# Patient Record
Sex: Male | Born: 1935 | Race: White | Hispanic: No | Marital: Married | Smoking: Never smoker
Health system: Southern US, Community
[De-identification: ages and names within clinical notes are randomized; demographics above are authoritative.]

## PROBLEM LIST (undated history)

## (undated) DIAGNOSIS — N183 Chronic kidney disease, stage 3 unspecified: Secondary | ICD-10-CM

## (undated) DIAGNOSIS — I4892 Unspecified atrial flutter: Secondary | ICD-10-CM

## (undated) DIAGNOSIS — I493 Ventricular premature depolarization: Secondary | ICD-10-CM

## (undated) DIAGNOSIS — F32A Depression, unspecified: Secondary | ICD-10-CM

## (undated) DIAGNOSIS — I5020 Unspecified systolic (congestive) heart failure: Secondary | ICD-10-CM

## (undated) DIAGNOSIS — Z860100 Personal history of colon polyps, unspecified: Secondary | ICD-10-CM

## (undated) DIAGNOSIS — I340 Nonrheumatic mitral (valve) insufficiency: Secondary | ICD-10-CM

## (undated) DIAGNOSIS — I2510 Atherosclerotic heart disease of native coronary artery without angina pectoris: Secondary | ICD-10-CM

## (undated) DIAGNOSIS — N400 Enlarged prostate without lower urinary tract symptoms: Secondary | ICD-10-CM

## (undated) DIAGNOSIS — I6529 Occlusion and stenosis of unspecified carotid artery: Secondary | ICD-10-CM

## (undated) DIAGNOSIS — C4490 Unspecified malignant neoplasm of skin, unspecified: Secondary | ICD-10-CM

## (undated) DIAGNOSIS — C4491 Basal cell carcinoma of skin, unspecified: Secondary | ICD-10-CM

## (undated) DIAGNOSIS — R011 Cardiac murmur, unspecified: Secondary | ICD-10-CM

## (undated) DIAGNOSIS — F419 Anxiety disorder, unspecified: Secondary | ICD-10-CM

## (undated) DIAGNOSIS — H269 Unspecified cataract: Secondary | ICD-10-CM

## (undated) DIAGNOSIS — Z978 Presence of other specified devices: Secondary | ICD-10-CM

## (undated) DIAGNOSIS — Z960 Presence of urogenital implants: Secondary | ICD-10-CM

## (undated) DIAGNOSIS — J189 Pneumonia, unspecified organism: Secondary | ICD-10-CM

## (undated) DIAGNOSIS — I471 Supraventricular tachycardia: Secondary | ICD-10-CM

## (undated) DIAGNOSIS — N390 Urinary tract infection, site not specified: Secondary | ICD-10-CM

## (undated) DIAGNOSIS — F329 Major depressive disorder, single episode, unspecified: Secondary | ICD-10-CM

## (undated) DIAGNOSIS — I739 Peripheral vascular disease, unspecified: Secondary | ICD-10-CM

## (undated) DIAGNOSIS — R2689 Other abnormalities of gait and mobility: Secondary | ICD-10-CM

## (undated) DIAGNOSIS — I480 Paroxysmal atrial fibrillation: Secondary | ICD-10-CM

## (undated) DIAGNOSIS — I219 Acute myocardial infarction, unspecified: Secondary | ICD-10-CM

## (undated) DIAGNOSIS — E785 Hyperlipidemia, unspecified: Secondary | ICD-10-CM

## (undated) DIAGNOSIS — E039 Hypothyroidism, unspecified: Secondary | ICD-10-CM

## (undated) DIAGNOSIS — I4719 Other supraventricular tachycardia: Secondary | ICD-10-CM

## (undated) HISTORY — DX: Hypothyroidism, unspecified: E03.9

## (undated) HISTORY — DX: Ventricular premature depolarization: I49.3

## (undated) HISTORY — DX: Other supraventricular tachycardia: I47.19

## (undated) HISTORY — DX: Atherosclerotic heart disease of native coronary artery without angina pectoris: I25.10

## (undated) HISTORY — DX: Occlusion and stenosis of unspecified carotid artery: I65.29

## (undated) HISTORY — DX: Paroxysmal atrial fibrillation: I48.0

## (undated) HISTORY — DX: Chronic kidney disease, stage 3 (moderate): N18.3

## (undated) HISTORY — DX: Chronic kidney disease, stage 3 unspecified: N18.30

## (undated) HISTORY — DX: Depression, unspecified: F32.A

## (undated) HISTORY — DX: Hyperlipidemia, unspecified: E78.5

## (undated) HISTORY — DX: Unspecified cataract: H26.9

## (undated) HISTORY — DX: Urinary tract infection, site not specified: N39.0

## (undated) HISTORY — DX: Other abnormalities of gait and mobility: R26.89

## (undated) HISTORY — DX: Unspecified malignant neoplasm of skin, unspecified: C44.90

## (undated) HISTORY — DX: Major depressive disorder, single episode, unspecified: F32.9

## (undated) HISTORY — DX: Acute myocardial infarction, unspecified: I21.9

## (undated) HISTORY — DX: Peripheral vascular disease, unspecified: I73.9

## (undated) HISTORY — DX: Benign prostatic hyperplasia without lower urinary tract symptoms: N40.0

## (undated) HISTORY — DX: Personal history of colonic polyps: Z86.010

## (undated) HISTORY — DX: Cardiac murmur, unspecified: R01.1

## (undated) HISTORY — DX: Unspecified systolic (congestive) heart failure: I50.20

## (undated) HISTORY — DX: Pneumonia, unspecified organism: J18.9

## (undated) HISTORY — DX: Unspecified atrial flutter: I48.92

## (undated) HISTORY — DX: Supraventricular tachycardia: I47.1

## (undated) HISTORY — DX: Personal history of colon polyps, unspecified: Z86.0100

## (undated) HISTORY — DX: Anxiety disorder, unspecified: F41.9

## (undated) HISTORY — DX: Basal cell carcinoma of skin, unspecified: C44.91

## (undated) HISTORY — DX: Nonrheumatic mitral (valve) insufficiency: I34.0

---

## 2000-03-07 ENCOUNTER — Encounter (INDEPENDENT_AMBULATORY_CARE_PROVIDER_SITE_OTHER): Payer: Self-pay

## 2000-03-07 ENCOUNTER — Other Ambulatory Visit: Admission: RE | Admit: 2000-03-07 | Discharge: 2000-03-07 | Payer: Self-pay | Admitting: Gastroenterology

## 2003-02-09 ENCOUNTER — Encounter: Payer: Self-pay | Admitting: Gastroenterology

## 2003-02-09 ENCOUNTER — Ambulatory Visit (HOSPITAL_COMMUNITY): Admission: RE | Admit: 2003-02-09 | Discharge: 2003-02-09 | Payer: Self-pay | Admitting: Gastroenterology

## 2004-07-21 ENCOUNTER — Encounter: Admission: RE | Admit: 2004-07-21 | Discharge: 2004-07-21 | Payer: Self-pay | Admitting: Internal Medicine

## 2004-07-27 ENCOUNTER — Ambulatory Visit: Payer: Self-pay

## 2004-09-04 ENCOUNTER — Ambulatory Visit: Payer: Self-pay | Admitting: Internal Medicine

## 2004-09-08 ENCOUNTER — Ambulatory Visit: Payer: Self-pay | Admitting: Internal Medicine

## 2004-10-31 ENCOUNTER — Ambulatory Visit: Payer: Self-pay | Admitting: Cardiology

## 2004-11-15 ENCOUNTER — Ambulatory Visit: Payer: Self-pay | Admitting: Internal Medicine

## 2004-11-20 ENCOUNTER — Ambulatory Visit: Payer: Self-pay | Admitting: Internal Medicine

## 2004-12-12 ENCOUNTER — Ambulatory Visit: Payer: Self-pay | Admitting: Cardiology

## 2004-12-26 ENCOUNTER — Ambulatory Visit: Payer: Self-pay | Admitting: Cardiology

## 2005-01-02 ENCOUNTER — Ambulatory Visit: Payer: Self-pay | Admitting: Internal Medicine

## 2005-01-02 ENCOUNTER — Inpatient Hospital Stay (HOSPITAL_BASED_OUTPATIENT_CLINIC_OR_DEPARTMENT_OTHER): Admission: RE | Admit: 2005-01-02 | Discharge: 2005-01-02 | Payer: Self-pay | Admitting: Cardiology

## 2005-01-10 ENCOUNTER — Ambulatory Visit: Payer: Self-pay

## 2005-02-02 ENCOUNTER — Ambulatory Visit: Payer: Self-pay | Admitting: Internal Medicine

## 2005-04-20 ENCOUNTER — Ambulatory Visit: Payer: Self-pay | Admitting: Cardiology

## 2005-06-10 ENCOUNTER — Emergency Department (HOSPITAL_COMMUNITY): Admission: EM | Admit: 2005-06-10 | Discharge: 2005-06-10 | Payer: Self-pay | Admitting: Family Medicine

## 2005-08-29 ENCOUNTER — Ambulatory Visit: Payer: Self-pay | Admitting: Internal Medicine

## 2005-09-05 ENCOUNTER — Ambulatory Visit: Payer: Self-pay | Admitting: Internal Medicine

## 2005-11-07 ENCOUNTER — Ambulatory Visit: Payer: Self-pay | Admitting: Internal Medicine

## 2005-11-09 ENCOUNTER — Ambulatory Visit: Payer: Self-pay | Admitting: Internal Medicine

## 2006-05-16 ENCOUNTER — Ambulatory Visit: Payer: Self-pay | Admitting: Internal Medicine

## 2006-05-21 ENCOUNTER — Ambulatory Visit: Payer: Self-pay | Admitting: Internal Medicine

## 2006-07-11 ENCOUNTER — Ambulatory Visit: Payer: Self-pay | Admitting: Internal Medicine

## 2006-07-11 LAB — CONVERTED CEMR LAB
ALT: 57 units/L — ABNORMAL HIGH (ref 0–40)
AST: 73 units/L — ABNORMAL HIGH (ref 0–37)
Albumin: 3.5 g/dL (ref 3.5–5.2)
Alkaline Phosphatase: 58 units/L (ref 39–117)
BUN: 14 mg/dL (ref 6–23)
CO2: 27 meq/L (ref 19–32)
Calcium: 9.1 mg/dL (ref 8.4–10.5)
Chloride: 106 meq/L (ref 96–112)
Chol/HDL Ratio, serum: 5.9
Cholesterol: 257 mg/dL (ref 0–200)
Creatinine, Ser: 1.4 mg/dL (ref 0.4–1.5)
Free T4: 0.9 ng/dL (ref 0.9–1.8)
GFR calc non Af Amer: 52 mL/min
Glomerular Filtration Rate, Af Am: 63 mL/min/{1.73_m2}
Glucose, Bld: 113 mg/dL — ABNORMAL HIGH (ref 70–99)
HDL: 43.7 mg/dL (ref >39.0)
LDL DIRECT: 173.4 mg/dL
Potassium: 4.2 meq/L (ref 3.5–5.1)
Sodium: 138 meq/L (ref 135–145)
TSH: 0.07 microintl units/mL — ABNORMAL LOW (ref 0.35–5.50)
Total Bilirubin: 1 mg/dL (ref 0.3–1.2)
Total Protein: 7.9 g/dL (ref 6.0–8.3)
Triglyceride fasting, serum: 85 mg/dL (ref 0–149)
VLDL: 17 mg/dL (ref 0–40)

## 2006-07-30 ENCOUNTER — Ambulatory Visit: Payer: Self-pay

## 2006-08-07 ENCOUNTER — Ambulatory Visit: Payer: Self-pay | Admitting: Internal Medicine

## 2006-08-14 ENCOUNTER — Emergency Department (HOSPITAL_COMMUNITY): Admission: EM | Admit: 2006-08-14 | Discharge: 2006-08-14 | Payer: Self-pay | Admitting: Family Medicine

## 2006-08-20 ENCOUNTER — Ambulatory Visit: Payer: Self-pay | Admitting: Cardiology

## 2006-08-26 ENCOUNTER — Ambulatory Visit: Payer: Self-pay

## 2006-08-26 ENCOUNTER — Ambulatory Visit: Payer: Self-pay | Admitting: Cardiology

## 2006-08-26 ENCOUNTER — Encounter: Payer: Self-pay | Admitting: Cardiology

## 2006-09-02 ENCOUNTER — Ambulatory Visit: Payer: Self-pay | Admitting: Cardiology

## 2006-09-24 HISTORY — PX: SKIN CANCER EXCISION: SHX779

## 2006-10-02 ENCOUNTER — Emergency Department (HOSPITAL_COMMUNITY): Admission: EM | Admit: 2006-10-02 | Discharge: 2006-10-02 | Payer: Self-pay | Admitting: Family Medicine

## 2006-12-25 ENCOUNTER — Emergency Department (HOSPITAL_COMMUNITY): Admission: EM | Admit: 2006-12-25 | Discharge: 2006-12-25 | Payer: Self-pay | Admitting: Family Medicine

## 2007-01-28 ENCOUNTER — Ambulatory Visit: Payer: Self-pay | Admitting: Cardiology

## 2007-04-16 ENCOUNTER — Ambulatory Visit: Payer: Self-pay | Admitting: Internal Medicine

## 2007-04-24 ENCOUNTER — Ambulatory Visit: Payer: Self-pay | Admitting: Internal Medicine

## 2007-04-24 LAB — CONVERTED CEMR LAB
ALT: 15 units/L (ref 0–53)
AST: 26 units/L (ref 0–37)
Albumin: 3.6 g/dL (ref 3.5–5.2)
Alkaline Phosphatase: 46 units/L (ref 39–117)
BUN: 18 mg/dL (ref 6–23)
Basophils Absolute: 0 10*3/uL (ref 0.0–0.1)
Basophils Relative: 0 % (ref 0.0–1.0)
Bilirubin Urine: NEGATIVE
Bilirubin, Direct: 0.1 mg/dL (ref 0.0–0.3)
CO2: 27 meq/L (ref 19–32)
Calcium: 9 mg/dL (ref 8.4–10.5)
Chloride: 107 meq/L (ref 96–112)
Cholesterol: 244 mg/dL (ref 0–200)
Creatinine, Ser: 1.3 mg/dL (ref 0.4–1.5)
Direct LDL: 167.4 mg/dL
Eosinophils Absolute: 0 10*3/uL (ref 0.0–0.6)
Eosinophils Relative: 0.6 % (ref 0.0–5.0)
GFR calc Af Amer: 68 mL/min
GFR calc non Af Amer: 57 mL/min
Glucose, Bld: 106 mg/dL — ABNORMAL HIGH (ref 70–99)
HCT: 40.6 % (ref 39.0–52.0)
HDL: 50.1 mg/dL (ref >39.0)
Hemoglobin, Urine: NEGATIVE
Hemoglobin: 14.1 g/dL (ref 13.0–17.0)
Ketones, ur: NEGATIVE mg/dL
Leukocytes, UA: NEGATIVE
Lymphocytes Relative: 38 % (ref 12.0–46.0)
MCHC: 34.7 g/dL (ref 30.0–36.0)
MCV: 94.4 fL (ref 78.0–100.0)
Monocytes Absolute: 0.7 10*3/uL (ref 0.2–0.7)
Monocytes Relative: 11 % (ref 3.0–11.0)
Neutro Abs: 3.5 10*3/uL (ref 1.4–7.7)
Neutrophils Relative %: 50.4 % (ref 43.0–77.0)
Nitrite: NEGATIVE
PSA: 1.51 ng/mL (ref 0.10–4.00)
Platelets: 132 10*3/uL — ABNORMAL LOW (ref 150–400)
Potassium: 3.8 meq/L (ref 3.5–5.1)
RBC: 4.3 M/uL (ref 4.22–5.81)
RDW: 12.9 % (ref 11.5–14.6)
Sodium: 140 meq/L (ref 135–145)
Specific Gravity, Urine: 1.025 (ref 1.000–1.03)
TSH: 0.59 microintl units/mL (ref 0.35–5.50)
Total Bilirubin: 1.1 mg/dL (ref 0.3–1.2)
Total CHOL/HDL Ratio: 4.9
Total Protein, Urine: NEGATIVE mg/dL
Total Protein: 7.2 g/dL (ref 6.0–8.3)
Triglycerides: 70 mg/dL (ref 0–149)
Urine Glucose: NEGATIVE mg/dL
Urobilinogen, UA: 0.2 (ref 0.0–1.0)
VLDL: 14 mg/dL (ref 0–40)
WBC: 6.8 10*3/uL (ref 4.5–10.5)
pH: 6 (ref 5.0–8.0)

## 2007-07-14 ENCOUNTER — Ambulatory Visit: Payer: Self-pay | Admitting: Internal Medicine

## 2007-07-14 LAB — CONVERTED CEMR LAB
ALT: 19 units/L (ref 0–53)
AST: 31 units/L (ref 0–37)
Albumin: 3.5 g/dL (ref 3.5–5.2)
Alkaline Phosphatase: 49 units/L (ref 39–117)
BUN: 13 mg/dL (ref 6–23)
Basophils Absolute: 0 10*3/uL (ref 0.0–0.1)
Basophils Relative: 0.2 % (ref 0.0–1.0)
Bilirubin Urine: NEGATIVE
Bilirubin, Direct: 0.2 mg/dL (ref 0.0–0.3)
CO2: 29 meq/L (ref 19–32)
Calcium: 8.9 mg/dL (ref 8.4–10.5)
Chloride: 110 meq/L (ref 96–112)
Cholesterol: 223 mg/dL (ref 0–200)
Creatinine, Ser: 1.3 mg/dL (ref 0.4–1.5)
Direct LDL: 146.2 mg/dL
Eosinophils Absolute: 0 10*3/uL (ref 0.0–0.6)
Eosinophils Relative: 0.7 % (ref 0.0–5.0)
GFR calc Af Amer: 68 mL/min
GFR calc non Af Amer: 57 mL/min
Glucose, Bld: 113 mg/dL — ABNORMAL HIGH (ref 70–99)
HCT: 39.8 % (ref 39.0–52.0)
HDL: 59.8 mg/dL (ref >39.0)
Hemoglobin, Urine: NEGATIVE
Hemoglobin: 14 g/dL (ref 13.0–17.0)
Ketones, ur: NEGATIVE mg/dL
Leukocytes, UA: NEGATIVE
Lymphocytes Relative: 38.8 % (ref 12.0–46.0)
MCHC: 35.2 g/dL (ref 30.0–36.0)
MCV: 95.1 fL (ref 78.0–100.0)
Monocytes Absolute: 0.6 10*3/uL (ref 0.2–0.7)
Monocytes Relative: 9.7 % (ref 3.0–11.0)
Neutro Abs: 3.4 10*3/uL (ref 1.4–7.7)
Neutrophils Relative %: 50.6 % (ref 43.0–77.0)
Nitrite: NEGATIVE
PSA: 1.37 ng/mL (ref 0.10–4.00)
Platelets: 122 10*3/uL — ABNORMAL LOW (ref 150–400)
Potassium: 4.2 meq/L (ref 3.5–5.1)
RBC: 4.19 M/uL — ABNORMAL LOW (ref 4.22–5.81)
RDW: 13 % (ref 11.5–14.6)
Sodium: 142 meq/L (ref 135–145)
Specific Gravity, Urine: 1.025 (ref 1.000–1.03)
TSH: 0.4 microintl units/mL (ref 0.35–5.50)
Total Bilirubin: 0.7 mg/dL (ref 0.3–1.2)
Total CHOL/HDL Ratio: 3.7
Total Protein, Urine: NEGATIVE mg/dL
Total Protein: 7.4 g/dL (ref 6.0–8.3)
Triglycerides: 76 mg/dL (ref 0–149)
Urine Glucose: NEGATIVE mg/dL
Urobilinogen, UA: 0.2 (ref 0.0–1.0)
VLDL: 15 mg/dL (ref 0–40)
WBC: 6.5 10*3/uL (ref 4.5–10.5)
pH: 5.5 (ref 5.0–8.0)

## 2007-07-21 ENCOUNTER — Encounter: Payer: Self-pay | Admitting: Internal Medicine

## 2007-07-21 DIAGNOSIS — E039 Hypothyroidism, unspecified: Secondary | ICD-10-CM

## 2007-07-21 DIAGNOSIS — Z860100 Personal history of colon polyps, unspecified: Secondary | ICD-10-CM | POA: Insufficient documentation

## 2007-07-21 DIAGNOSIS — I2510 Atherosclerotic heart disease of native coronary artery without angina pectoris: Secondary | ICD-10-CM

## 2007-07-21 DIAGNOSIS — I739 Peripheral vascular disease, unspecified: Secondary | ICD-10-CM | POA: Insufficient documentation

## 2007-07-21 DIAGNOSIS — Z85828 Personal history of other malignant neoplasm of skin: Secondary | ICD-10-CM

## 2007-07-21 DIAGNOSIS — F411 Generalized anxiety disorder: Secondary | ICD-10-CM | POA: Insufficient documentation

## 2007-07-29 ENCOUNTER — Encounter: Payer: Self-pay | Admitting: Internal Medicine

## 2007-09-08 ENCOUNTER — Encounter: Payer: Self-pay | Admitting: Internal Medicine

## 2007-10-08 ENCOUNTER — Ambulatory Visit: Payer: Self-pay | Admitting: Internal Medicine

## 2007-10-09 LAB — CONVERTED CEMR LAB
ALT: 18 units/L (ref 0–53)
AST: 31 units/L (ref 0–37)
Albumin: 3.7 g/dL (ref 3.5–5.2)
Alkaline Phosphatase: 45 units/L (ref 39–117)
BUN: 13 mg/dL (ref 6–23)
Basophils Absolute: 0 10*3/uL (ref 0.0–0.1)
Basophils Relative: 0 % (ref 0.0–1.0)
Bilirubin Urine: NEGATIVE
Bilirubin, Direct: 0.2 mg/dL (ref 0.0–0.3)
CO2: 30 meq/L (ref 19–32)
Calcium: 9.3 mg/dL (ref 8.4–10.5)
Chloride: 102 meq/L (ref 96–112)
Cholesterol: 258 mg/dL (ref 0–200)
Creatinine, Ser: 1.3 mg/dL (ref 0.4–1.5)
Direct LDL: 163.7 mg/dL
Eosinophils Absolute: 0 10*3/uL (ref 0.0–0.6)
Eosinophils Relative: 0.3 % (ref 0.0–5.0)
GFR calc Af Amer: 68 mL/min
GFR calc non Af Amer: 57 mL/min
Glucose, Bld: 110 mg/dL — ABNORMAL HIGH (ref 70–99)
HCT: 40.6 % (ref 39.0–52.0)
HDL: 61.5 mg/dL (ref >39.0)
Hemoglobin, Urine: NEGATIVE
Hemoglobin: 14.4 g/dL (ref 13.0–17.0)
Ketones, ur: NEGATIVE mg/dL
Leukocytes, UA: NEGATIVE
Lymphocytes Relative: 35.6 % (ref 12.0–46.0)
MCHC: 35.5 g/dL (ref 30.0–36.0)
MCV: 95.2 fL (ref 78.0–100.0)
Monocytes Absolute: 0.8 10*3/uL — ABNORMAL HIGH (ref 0.2–0.7)
Monocytes Relative: 11 % (ref 3.0–11.0)
Neutro Abs: 3.6 10*3/uL (ref 1.4–7.7)
Neutrophils Relative %: 53.1 % (ref 43.0–77.0)
Nitrite: NEGATIVE
Platelets: 131 10*3/uL — ABNORMAL LOW (ref 150–400)
Potassium: 4.1 meq/L (ref 3.5–5.1)
RBC: 4.27 M/uL (ref 4.22–5.81)
RDW: 12.8 % (ref 11.5–14.6)
Sodium: 140 meq/L (ref 135–145)
Specific Gravity, Urine: 1.02 (ref 1.000–1.03)
TSH: 1.19 microintl units/mL (ref 0.35–5.50)
Total Bilirubin: 1.2 mg/dL (ref 0.3–1.2)
Total CHOL/HDL Ratio: 4.2
Total Protein, Urine: NEGATIVE mg/dL
Total Protein: 7.4 g/dL (ref 6.0–8.3)
Triglycerides: 95 mg/dL (ref 0–149)
Urine Glucose: NEGATIVE mg/dL
Urobilinogen, UA: 0.2 (ref 0.0–1.0)
VLDL: 19 mg/dL (ref 0–40)
WBC: 6.9 10*3/uL (ref 4.5–10.5)
pH: 6 (ref 5.0–8.0)

## 2007-10-16 ENCOUNTER — Ambulatory Visit: Payer: Self-pay | Admitting: Internal Medicine

## 2007-10-16 ENCOUNTER — Telehealth: Payer: Self-pay | Admitting: Internal Medicine

## 2007-10-17 ENCOUNTER — Encounter: Payer: Self-pay | Admitting: Internal Medicine

## 2007-12-03 ENCOUNTER — Encounter: Payer: Self-pay | Admitting: Internal Medicine

## 2008-02-04 ENCOUNTER — Ambulatory Visit: Payer: Self-pay | Admitting: Internal Medicine

## 2008-02-04 LAB — CONVERTED CEMR LAB
Basophils Absolute: 0 10*3/uL (ref 0.0–0.1)
Basophils Relative: 0.2 % (ref 0.0–1.0)
Cholesterol: 244 mg/dL (ref 0–200)
Direct LDL: 163.3 mg/dL
Eosinophils Absolute: 0 10*3/uL (ref 0.0–0.7)
Eosinophils Relative: 0.3 % (ref 0.0–5.0)
HCT: 41.2 % (ref 39.0–52.0)
HDL: 46.1 mg/dL (ref >39.0)
Hemoglobin: 14 g/dL (ref 13.0–17.0)
Hgb A1c MFr Bld: 5.6 % (ref 4.6–6.0)
Lymphocytes Relative: 36.7 % (ref 12.0–46.0)
MCHC: 33.9 g/dL (ref 30.0–36.0)
MCV: 95.9 fL (ref 78.0–100.0)
Monocytes Absolute: 0.8 10*3/uL (ref 0.1–1.0)
Monocytes Relative: 12.9 % — ABNORMAL HIGH (ref 3.0–12.0)
Neutro Abs: 3.3 10*3/uL (ref 1.4–7.7)
Neutrophils Relative %: 49.9 % (ref 43.0–77.0)
PSA: 1.68 ng/mL (ref 0.10–4.00)
Platelets: 126 10*3/uL — ABNORMAL LOW (ref 150–400)
RBC: 4.3 M/uL (ref 4.22–5.81)
RDW: 12.8 % (ref 11.5–14.6)
TSH: 0.91 microintl units/mL (ref 0.35–5.50)
Total CHOL/HDL Ratio: 5.3
Triglycerides: 77 mg/dL (ref 0–149)
VLDL: 15 mg/dL (ref 0–40)
WBC: 6.5 10*3/uL (ref 4.5–10.5)

## 2008-02-11 ENCOUNTER — Ambulatory Visit: Payer: Self-pay | Admitting: Internal Medicine

## 2008-02-11 DIAGNOSIS — G4700 Insomnia, unspecified: Secondary | ICD-10-CM

## 2008-03-24 ENCOUNTER — Encounter: Payer: Self-pay | Admitting: Internal Medicine

## 2008-05-20 ENCOUNTER — Ambulatory Visit: Payer: Self-pay | Admitting: Internal Medicine

## 2008-05-20 LAB — CONVERTED CEMR LAB
BUN: 20 mg/dL (ref 6–23)
CO2: 28 meq/L (ref 19–32)
Calcium: 8.9 mg/dL (ref 8.4–10.5)
Chloride: 106 meq/L (ref 96–112)
Creatinine, Ser: 1.4 mg/dL (ref 0.4–1.5)
GFR calc Af Amer: 63 mL/min
GFR calc non Af Amer: 52 mL/min
Glucose, Bld: 105 mg/dL — ABNORMAL HIGH (ref 70–99)
Potassium: 4.4 meq/L (ref 3.5–5.1)
Sodium: 138 meq/L (ref 135–145)

## 2008-05-24 ENCOUNTER — Ambulatory Visit: Payer: Self-pay | Admitting: Internal Medicine

## 2008-10-13 DIAGNOSIS — R2681 Unsteadiness on feet: Secondary | ICD-10-CM | POA: Insufficient documentation

## 2009-02-22 DIAGNOSIS — I480 Paroxysmal atrial fibrillation: Secondary | ICD-10-CM

## 2009-02-22 HISTORY — DX: Paroxysmal atrial fibrillation: I48.0

## 2009-03-19 ENCOUNTER — Inpatient Hospital Stay (HOSPITAL_COMMUNITY): Admission: EM | Admit: 2009-03-19 | Discharge: 2009-03-24 | Payer: Self-pay | Admitting: Emergency Medicine

## 2009-03-19 ENCOUNTER — Ambulatory Visit: Payer: Self-pay | Admitting: Cardiovascular Disease

## 2009-03-20 ENCOUNTER — Encounter: Payer: Self-pay | Admitting: Cardiology

## 2009-03-21 ENCOUNTER — Encounter: Payer: Self-pay | Admitting: Cardiovascular Disease

## 2009-03-23 ENCOUNTER — Encounter: Payer: Self-pay | Admitting: Cardiovascular Disease

## 2009-03-28 ENCOUNTER — Encounter: Payer: Self-pay | Admitting: Cardiology

## 2009-03-29 ENCOUNTER — Encounter: Payer: Self-pay | Admitting: Cardiology

## 2009-03-29 LAB — CONVERTED CEMR LAB
POC INR: 1.4
Prothrombin Time: 17.3 s

## 2009-03-30 ENCOUNTER — Encounter: Payer: Self-pay | Admitting: Internal Medicine

## 2009-03-30 LAB — CONVERTED CEMR LAB
POC INR: 1.4
Prothrombin Time: 14.5 s

## 2009-03-31 ENCOUNTER — Encounter: Payer: Self-pay | Admitting: Cardiology

## 2009-04-01 ENCOUNTER — Encounter: Payer: Self-pay | Admitting: Cardiology

## 2009-04-01 ENCOUNTER — Telehealth: Payer: Self-pay | Admitting: Cardiology

## 2009-04-01 LAB — CONVERTED CEMR LAB
POC INR: 1.7
Prothrombin Time: 16.1 s

## 2009-04-04 ENCOUNTER — Telehealth: Payer: Self-pay | Admitting: Cardiology

## 2009-04-04 ENCOUNTER — Encounter: Payer: Self-pay | Admitting: Cardiology

## 2009-04-04 DIAGNOSIS — I080 Rheumatic disorders of both mitral and aortic valves: Secondary | ICD-10-CM

## 2009-04-04 DIAGNOSIS — I5022 Chronic systolic (congestive) heart failure: Secondary | ICD-10-CM

## 2009-04-04 LAB — CONVERTED CEMR LAB: POC INR: 3.4

## 2009-04-05 ENCOUNTER — Ambulatory Visit: Payer: Self-pay | Admitting: Cardiology

## 2009-04-06 ENCOUNTER — Encounter: Payer: Self-pay | Admitting: Cardiology

## 2009-04-07 ENCOUNTER — Encounter: Payer: Self-pay | Admitting: Cardiology

## 2009-04-07 LAB — CONVERTED CEMR LAB
INR: 3.4
POC INR: 3.4
Prothrombin Time: 22.1 s

## 2009-04-11 ENCOUNTER — Encounter: Payer: Self-pay | Admitting: Cardiology

## 2009-04-11 LAB — CONVERTED CEMR LAB
POC INR: 2.4
Prothrombin Time: 18.5 s

## 2009-04-13 ENCOUNTER — Telehealth: Payer: Self-pay | Admitting: Cardiology

## 2009-04-15 ENCOUNTER — Telehealth (INDEPENDENT_AMBULATORY_CARE_PROVIDER_SITE_OTHER): Payer: Self-pay | Admitting: *Deleted

## 2009-04-18 ENCOUNTER — Ambulatory Visit: Payer: Self-pay | Admitting: Cardiology

## 2009-04-18 LAB — CONVERTED CEMR LAB
POC INR: 2.5
Prothrombin Time: 19.2 s

## 2009-04-20 ENCOUNTER — Encounter: Payer: Self-pay | Admitting: Cardiology

## 2009-04-20 ENCOUNTER — Telehealth (INDEPENDENT_AMBULATORY_CARE_PROVIDER_SITE_OTHER): Payer: Self-pay | Admitting: *Deleted

## 2009-05-03 ENCOUNTER — Encounter: Payer: Self-pay | Admitting: Internal Medicine

## 2009-05-03 LAB — CONVERTED CEMR LAB
POC INR: 3.9
Prothrombin Time: 23.9 s

## 2009-05-13 ENCOUNTER — Ambulatory Visit: Payer: Self-pay | Admitting: Cardiology

## 2009-05-13 ENCOUNTER — Telehealth: Payer: Self-pay | Admitting: Internal Medicine

## 2009-05-13 ENCOUNTER — Encounter (INDEPENDENT_AMBULATORY_CARE_PROVIDER_SITE_OTHER): Payer: Self-pay | Admitting: Cardiology

## 2009-05-13 LAB — CONVERTED CEMR LAB: POC INR: 2.7

## 2009-05-18 ENCOUNTER — Telehealth: Payer: Self-pay | Admitting: Internal Medicine

## 2009-05-19 ENCOUNTER — Ambulatory Visit: Payer: Self-pay | Admitting: Cardiovascular Disease

## 2009-05-19 ENCOUNTER — Ambulatory Visit: Payer: Self-pay | Admitting: Cardiology

## 2009-05-19 LAB — CONVERTED CEMR LAB: POC INR: 2.7

## 2009-05-31 ENCOUNTER — Ambulatory Visit: Payer: Self-pay | Admitting: Cardiology

## 2009-05-31 LAB — CONVERTED CEMR LAB: POC INR: 2.8

## 2009-06-01 ENCOUNTER — Encounter: Payer: Self-pay | Admitting: Internal Medicine

## 2009-06-07 LAB — CONVERTED CEMR LAB
BUN: 23 mg/dL (ref 6–23)
Basophils Absolute: 0 10*3/uL (ref 0.0–0.1)
Basophils Relative: 0 % (ref 0.0–3.0)
CO2: 29 meq/L (ref 19–32)
Calcium: 8.6 mg/dL (ref 8.4–10.5)
Chloride: 104 meq/L (ref 96–112)
Creatinine, Ser: 1.6 mg/dL — ABNORMAL HIGH (ref 0.4–1.5)
Eosinophils Absolute: 0 10*3/uL (ref 0.0–0.7)
Eosinophils Relative: 0.3 % (ref 0.0–5.0)
GFR calc non Af Amer: 44.27 mL/min (ref >60)
Glucose, Bld: 130 mg/dL — ABNORMAL HIGH (ref 70–99)
HCT: 39.4 % (ref 39.0–52.0)
Hemoglobin: 13.9 g/dL (ref 13.0–17.0)
Lymphocytes Relative: 33.7 % (ref 12.0–46.0)
Lymphs Abs: 2.2 10*3/uL (ref 0.7–4.0)
MCHC: 35.2 g/dL (ref 30.0–36.0)
MCV: 93.1 fL (ref 78.0–100.0)
Monocytes Absolute: 0.7 10*3/uL (ref 0.1–1.0)
Monocytes Relative: 10.8 % (ref 3.0–12.0)
Neutro Abs: 3.5 10*3/uL (ref 1.4–7.7)
Neutrophils Relative %: 55.2 % (ref 43.0–77.0)
Platelets: 130 10*3/uL — ABNORMAL LOW (ref 150.0–400.0)
Potassium: 4.3 meq/L (ref 3.5–5.1)
RBC: 4.24 M/uL (ref 4.22–5.81)
RDW: 14 % (ref 11.5–14.6)
Sodium: 139 meq/L (ref 135–145)
TSH: 1.57 microintl units/mL (ref 0.35–5.50)
Vitamin B-12: 343 pg/mL (ref 211–911)
WBC: 6.4 10*3/uL (ref 4.5–10.5)

## 2009-06-22 ENCOUNTER — Ambulatory Visit: Payer: Self-pay | Admitting: Cardiology

## 2009-06-22 LAB — CONVERTED CEMR LAB: POC INR: 2.9

## 2009-07-18 ENCOUNTER — Ambulatory Visit: Payer: Self-pay | Admitting: Cardiovascular Disease

## 2009-07-18 LAB — CONVERTED CEMR LAB: POC INR: 2.6

## 2009-08-04 ENCOUNTER — Ambulatory Visit: Payer: Self-pay | Admitting: Cardiology

## 2009-08-04 ENCOUNTER — Ambulatory Visit: Payer: Self-pay | Admitting: Internal Medicine

## 2009-08-04 DIAGNOSIS — I10 Essential (primary) hypertension: Secondary | ICD-10-CM

## 2009-08-04 LAB — CONVERTED CEMR LAB: POC INR: 2.4

## 2009-09-01 ENCOUNTER — Ambulatory Visit: Payer: Self-pay | Admitting: Cardiovascular Disease

## 2009-09-01 LAB — CONVERTED CEMR LAB: POC INR: 2.4

## 2009-09-26 ENCOUNTER — Telehealth: Payer: Self-pay | Admitting: Cardiovascular Disease

## 2009-09-27 ENCOUNTER — Ambulatory Visit: Payer: Self-pay | Admitting: Cardiology

## 2009-09-27 LAB — CONVERTED CEMR LAB: POC INR: 2.5

## 2009-10-26 ENCOUNTER — Ambulatory Visit: Payer: Self-pay | Admitting: Internal Medicine

## 2009-10-26 LAB — CONVERTED CEMR LAB: POC INR: 2

## 2009-11-09 ENCOUNTER — Ambulatory Visit: Payer: Self-pay | Admitting: Internal Medicine

## 2009-11-09 LAB — CONVERTED CEMR LAB: POC INR: 2.5

## 2009-11-14 ENCOUNTER — Ambulatory Visit: Payer: Self-pay | Admitting: Internal Medicine

## 2009-11-14 LAB — CONVERTED CEMR LAB: POC INR: 2.4

## 2009-11-16 ENCOUNTER — Encounter: Payer: Self-pay | Admitting: Cardiology

## 2009-11-17 ENCOUNTER — Telehealth: Payer: Self-pay | Admitting: Cardiology

## 2009-11-23 ENCOUNTER — Ambulatory Visit: Payer: Self-pay | Admitting: Cardiology

## 2009-12-01 ENCOUNTER — Ambulatory Visit: Payer: Self-pay | Admitting: Cardiology

## 2009-12-01 LAB — CONVERTED CEMR LAB: POC INR: 2.2

## 2009-12-15 ENCOUNTER — Ambulatory Visit: Payer: Self-pay | Admitting: Cardiology

## 2009-12-15 LAB — CONVERTED CEMR LAB: POC INR: 2

## 2009-12-29 ENCOUNTER — Ambulatory Visit: Payer: Self-pay | Admitting: Cardiovascular Disease

## 2009-12-29 LAB — CONVERTED CEMR LAB: POC INR: 2.4

## 2010-01-19 ENCOUNTER — Ambulatory Visit: Payer: Self-pay | Admitting: Cardiology

## 2010-01-19 LAB — CONVERTED CEMR LAB: POC INR: 2.3

## 2010-01-31 ENCOUNTER — Ambulatory Visit: Payer: Self-pay | Admitting: Cardiology

## 2010-02-16 ENCOUNTER — Ambulatory Visit: Payer: Self-pay | Admitting: Cardiology

## 2010-02-16 LAB — CONVERTED CEMR LAB: POC INR: 2.1

## 2010-03-15 ENCOUNTER — Ambulatory Visit: Payer: Self-pay | Admitting: Cardiovascular Disease

## 2010-03-15 LAB — CONVERTED CEMR LAB: POC INR: 2

## 2010-03-29 ENCOUNTER — Emergency Department (HOSPITAL_COMMUNITY): Admission: EM | Admit: 2010-03-29 | Discharge: 2010-03-29 | Payer: Self-pay | Admitting: Family Medicine

## 2010-04-14 ENCOUNTER — Ambulatory Visit: Payer: Self-pay | Admitting: Cardiology

## 2010-04-14 LAB — CONVERTED CEMR LAB: POC INR: 1.6

## 2010-04-21 ENCOUNTER — Ambulatory Visit: Payer: Self-pay | Admitting: Cardiology

## 2010-04-21 LAB — CONVERTED CEMR LAB: POC INR: 1.8

## 2010-05-09 ENCOUNTER — Ambulatory Visit: Payer: Self-pay | Admitting: Cardiology

## 2010-05-09 LAB — CONVERTED CEMR LAB: POC INR: 2

## 2010-06-01 ENCOUNTER — Ambulatory Visit: Payer: Self-pay | Admitting: Cardiology

## 2010-06-01 ENCOUNTER — Ambulatory Visit: Payer: Self-pay | Admitting: Internal Medicine

## 2010-06-01 DIAGNOSIS — I5023 Acute on chronic systolic (congestive) heart failure: Secondary | ICD-10-CM

## 2010-06-01 DIAGNOSIS — I2510 Atherosclerotic heart disease of native coronary artery without angina pectoris: Secondary | ICD-10-CM | POA: Insufficient documentation

## 2010-06-02 LAB — CONVERTED CEMR LAB
BUN: 24 mg/dL — ABNORMAL HIGH (ref 6–23)
Calcium: 8.7 mg/dL (ref 8.4–10.5)
Eosinophils Relative: 0.3 % (ref 0.0–5.0)
GFR calc non Af Amer: 50.27 mL/min (ref >60)
HCT: 37.9 % — ABNORMAL LOW (ref 39.0–52.0)
Lymphocytes Relative: 29 % (ref 12.0–46.0)
Lymphs Abs: 2.5 10*3/uL (ref 0.7–4.0)
Monocytes Relative: 8.9 % (ref 3.0–12.0)
Neutrophils Relative %: 61.2 % (ref 43.0–77.0)
Platelets: 172 10*3/uL (ref 150.0–400.0)
Potassium: 4.1 meq/L (ref 3.5–5.1)
WBC: 8.6 10*3/uL (ref 4.5–10.5)

## 2010-06-28 ENCOUNTER — Ambulatory Visit: Payer: Self-pay | Admitting: Cardiovascular Disease

## 2010-06-28 LAB — CONVERTED CEMR LAB: POC INR: 2.1

## 2010-07-26 ENCOUNTER — Ambulatory Visit: Payer: Self-pay | Admitting: Cardiology

## 2010-07-26 LAB — CONVERTED CEMR LAB: POC INR: 2.4

## 2010-08-09 ENCOUNTER — Ambulatory Visit: Payer: Self-pay | Admitting: Cardiology

## 2010-08-09 ENCOUNTER — Telehealth: Payer: Self-pay | Admitting: Internal Medicine

## 2010-08-24 ENCOUNTER — Ambulatory Visit: Payer: Self-pay | Admitting: Cardiovascular Disease

## 2010-08-24 LAB — CONVERTED CEMR LAB: POC INR: 2.4

## 2010-09-20 ENCOUNTER — Ambulatory Visit: Payer: Self-pay | Admitting: Cardiology

## 2010-10-15 ENCOUNTER — Encounter: Payer: Self-pay | Admitting: Internal Medicine

## 2010-10-18 ENCOUNTER — Ambulatory Visit: Admission: RE | Admit: 2010-10-18 | Discharge: 2010-10-18 | Payer: Self-pay | Source: Home / Self Care

## 2010-10-22 LAB — CONVERTED CEMR LAB
ALT: 29 units/L (ref 0–53)
AST: 25 units/L (ref 0–37)
Albumin: 3.9 g/dL (ref 3.5–5.2)
Alkaline Phosphatase: 42 units/L (ref 39–117)
BUN: 23 mg/dL (ref 6–23)
Basophils Absolute: 0 10*3/uL (ref 0.0–0.1)
Basophils Relative: 0.5 % (ref 0.0–3.0)
Bilirubin, Direct: 0.1 mg/dL (ref 0.0–0.3)
CO2: 29 meq/L (ref 19–32)
Calcium: 8.8 mg/dL (ref 8.4–10.5)
Chloride: 104 meq/L (ref 96–112)
Creatinine, Ser: 1.5 mg/dL (ref 0.4–1.5)
Eosinophils Absolute: 0 10*3/uL (ref 0.0–0.7)
Eosinophils Relative: 0.4 % (ref 0.0–5.0)
GFR calc non Af Amer: 47.67 mL/min (ref >60)
Glucose, Bld: 102 mg/dL — ABNORMAL HIGH (ref 70–99)
HCT: 40.7 % (ref 39.0–52.0)
Hemoglobin: 14 g/dL (ref 13.0–17.0)
Lymphocytes Relative: 28.8 % (ref 12.0–46.0)
Lymphs Abs: 2.1 10*3/uL (ref 0.7–4.0)
MCHC: 34.5 g/dL (ref 30.0–36.0)
MCV: 94.5 fL (ref 78.0–100.0)
Monocytes Absolute: 0.7 10*3/uL (ref 0.1–1.0)
Monocytes Relative: 9.4 % (ref 3.0–12.0)
Neutro Abs: 4.4 10*3/uL (ref 1.4–7.7)
Neutrophils Relative %: 60.9 % (ref 43.0–77.0)
Platelets: 133 10*3/uL — ABNORMAL LOW (ref 150.0–400.0)
Potassium: 4.1 meq/L (ref 3.5–5.1)
RBC: 4.3 M/uL (ref 4.22–5.81)
RDW: 13.3 % (ref 11.5–14.6)
Sodium: 140 meq/L (ref 135–145)
TSH: 2.19 microintl units/mL (ref 0.35–5.50)
Total Bilirubin: 0.9 mg/dL (ref 0.3–1.2)
Total Protein: 7.4 g/dL (ref 6.0–8.3)
WBC: 7.2 10*3/uL (ref 4.5–10.5)

## 2010-10-24 NOTE — Medication Information (Signed)
Summary: rov/tm  Anticoagulant Therapy  Managed by: Bethena Midget, RN, BSN Referring MD: Juanda Chance MD, Bruce PCP: dr Elvina [facility] [address] Supervising MD: Bensimhon MD, Reuel Boom Indication 1: Atrial Fibrillation Lab Used: [facility] [place] Site: [address] INR POC 2.0 INR RANGE 2.0-3.0  Dietary changes: no    Health status changes: no    Bleeding/hemorrhagic complications: no    Recent/future hospitalizations: no    Any changes in medication regimen? no    Recent/future dental: no  Any missed doses?: no       Is patient compliant with meds? yes       Allergies: 1)  ! Crestor 2)  Vytorin 3)  Atenolol  Anticoagulation Management History:      The patient is taking warfarin and comes in today for a routine follow up visit.  Positive risk factors for bleeding include an age of 23 years or older and presence of serious comorbidities.  The bleeding index is 'intermediate risk'.  Positive CHADS2 values include History of CHF, History of HTN, and Age > 68 years old.  His last INR was 3.4.  Anticoagulation responsible provider: Bensimhon MD, Reuel Boom.  INR POC: 2.0.  Cuvette Lot#: [id number].  Exp: 12/2010.    Anticoagulation Management Assessment/Plan:      The patient's current anticoagulation dose is Warfarin sodium 1 mg tabs: Use as directed by Anticoagualtion Clinic. take 2mg  daily except monday and friday he takes 1mg .  The target INR is 2.0-3.0.  The next INR is due 11/09/2009.  Anticoagulation instructions were given to patient/daughter.  Results were reviewed/authorized by Bethena Midget, RN, BSN.  He was notified by Bethena Midget, RN, BSN.         Prior Anticoagulation Instructions: INR 2.5 Continue 2mg s daily except 1mg s on Mondays and Fridays. Recheck in 4 weeks.   Current Anticoagulation Instructions: INR 2.0 Today 3mg s then resume 2mg s daily except 1mg s on Mondays and Fridays. Recheck in 2 weeks.

## 2010-10-24 NOTE — Assessment & Plan Note (Signed)
Summary: f32m   Visit Type:  Follow-up Primary Provider:  dr Kenyon Ana [facility] [address]   History of Present Illness: The patient is 77 years old and return for management of CHF and atrial fibrillation. He has a history of paroxysmal fibrillation which has been managed with amiodarone and Coumadin. We cut his amiodarone dose down to 100 mg a day because of a questionable rash. He also has had systolic CHF and has an ejection fraction of 40% with 2+ mitral regurgitation by echo. He was hospitalized in June of 2010 with an ammonia and these other problems as well.  He has done quite well since his last visit here. He's had no chest pain and minimal palpitations and shortness of breath. He has had no fluid accumulation.  He does have chronic renal insufficiency with creatinines in the range of 1.4. He has had a history of nonobstructive carotid disease at catheterization in 2006.  Current Medications (verified): 1)  Levothroid 100 Mcg Tabs (Levothyroxine Sodium) .Marland Kitchen.. 1 Qd 2)  Flomax 0.4 Mg Cp24 (Tamsulosin Hcl) .Marland Kitchen.. 1 Qd 3)  Furosemide 40 Mg Tabs (Furosemide) .... Take One Tablet By Mouth Daily. 4)  Zolpidem Tartrate 10 Mg  Tabs (Zolpidem Tartrate) .... 1/2 or 1 By Mouth At [facility] 5)  Warfarin Sodium 1 Mg Tabs (Warfarin Sodium) .... Use As Directed By [employer]. Take 2mg  Daily Except Monday and Friday He Takes 1mg  6)  Metoprolol Succinate 25 Mg Xr24h-Tab (Metoprolol Succinate) .... Take One Tablet By Mouth Daily 7)  Klor-Con 20 Meq Pack (Potassium Chloride) .Marland Kitchen.. 1 Tab Once Daily 8)  Amiodarone Hcl 200 Mg Tabs (Amiodarone Hcl) .... Take 1/2  Tablet By Mouth Daily 9)  Fish Oil 1200 Mg Caps (Omega-3 Fatty Acids) .... Take 1 Capsule By Mouth Two Times A Day 10)  Coq10 100 Mg Caps (Coenzyme Q10) .... Take 1 Capsule By Mouth Once A Day 11)  Senokot S 8.6-50 Mg Tabs (Sennosides-Docusate Sodium) .... Take 1 Tablet By Mouth Once A Day 12)  Centrum Silver  Tabs (Multiple  Vitamins-Minerals) .... Take 1 Tablet Daily  Allergies: 1)  ! Crestor 2)  Vytorin 3)  Atenolol  Past History:  Past Medical History: Reviewed history from 04/04/2009 and no changes required. Coronary artery disease Hyperlipidemia Hypothyroidism Peripheral vascular disease Anxiety Skin cancer, hx of 2008 [facility] Colonic polyps, hx of Skin cancer, hx of 1. Atrial fibrillation with rapid ventricular response treated with     amiodarone therapy converting to normal sinus rhythm 02/2009 2. 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 3. Pneumonia treated with Levaquin and BiPAP for hypoxia 02/2009. 4. Chronic renal insufficiency.  Creatinine at time of discharge 1.44     with a BUN of 22. 5. Deconditioning.  The patient underwent cardiac rehab assistance and     a nutritional consult assistance during this hospitalization 02/2009 6. Severe mitral regurgitation grade 3/4, confirmed by cardiac     catheterization and 2-D echocardiogram obtained this admission.     The patient is not interested in pursuing further surgical     treatment of his mitral regurgitation. 7. Coronary artery disease, status post cardiac catheterization April     2006, EF of 30-40% at that time with 2 to 3+ mitral regurgitation     and otherwise mostly nonobstructive coronary artery disease with a     90% stenosis  in the distal PDA.  Past medical history includes     hypertension, hyperlipidemia, hypothyroidism, chronic renal     insufficiency, asymptomatic PVCs.  Review of Systems       ROS is negative except as outlined in HPI.   Vital Signs:  Patient profile:   77 year old male Height:      66 inches Weight:      139 pounds Pulse rate:   71 / minute BP sitting:   128 / 72  (right arm)  Vitals Entered By: Laurance Flatten CMA (August 09, 2010 3:41 PM)  Physical Exam  Additional Exam:   Gen. Well-nourished, in no distress   Neck: No JVD, thyroid not enlarged, no carotid bruits Lungs: No tachypnea, clear without rales, rhonchi or wheezes Cardiovascular: Rhythm regular, PMI not displaced,  heart sounds  normal, no murmurs or gallops, no peripheral edema, pulses normal in all 4 extremities. Abdomen: BS normal, abdomen soft and non-tender without masses or organomegaly, no hepatosplenomegaly. MS: No deformities, no cyanosis or clubbing   Neuro:  No focal sns   Skin:  no lesions    Impression & Recommendations:  Problem # 1:  ACUTE ON CHRONIC SYSTOLIC HEART FAILURE (ICD-428.23) He has an ejection fraction of 40% and a history of systolic CHF. He appears euvolemic today and this appears compensated. His updated medication list for this problem includes:    Furosemide 40 Mg Tabs (Furosemide) .Marland Kitchen... Take one tablet by mouth daily.    Warfarin Sodium 1 Mg Tabs (Warfarin sodium) ..... Use as directed by anticoagualtion clinic. take 2mg  daily except monday and friday he takes 1mg     Metoprolol Succinate 25 Mg Xr24h-tab (Metoprolol succinate) .Marland Kitchen... Take one tablet by mouth daily    Amiodarone Hcl 200 Mg Tabs (Amiodarone hcl) .Marland Kitchen... Take 1/2  tablet by mouth daily  Problem # 2:  ATRIAL FIBRILLATION (ICD-427.31) He has a history of paroxysmal fibrillation which is managed with amiodarone and Coumadin. He's had no recent symptomatic recurrences. His updated medication list for this problem includes:    Warfarin Sodium 1 Mg Tabs (Warfarin sodium) ..... Use as directed by anticoagualtion clinic. take 2mg  daily except monday and friday he takes 1mg     Metoprolol Succinate 25 Mg Xr24h-tab (Metoprolol succinate) .Marland Kitchen... Take one tablet by mouth daily    Amiodarone Hcl 200 Mg Tabs (Amiodarone hcl) .Marland Kitchen... Take 1/2  tablet by mouth daily  Problem # 3:  HYPERTENSION, BENIGN (ICD-401.1) This appears well controlled on current therapy. His updated medication list for this problem includes:     Furosemide 40 Mg Tabs (Furosemide) .Marland Kitchen... Take one tablet by mouth daily.    Metoprolol Succinate 25 Mg Xr24h-tab (Metoprolol succinate) .Marland Kitchen... Take one tablet by mouth daily  Problem # 4:  CORONARY ATHEROSCLEROSIS NATIVE CORONARY ARTERY (ICD-414.01) He has a history of primarily nonobstructive disease at catheterization in 2006. His cardiomyopathy may be a nonischemic cardiomyopathy. He's had no recent chest pain and this problem appears stable. His updated medication list for this problem includes:    Warfarin Sodium 1 Mg Tabs (Warfarin sodium) ..... Use as directed by anticoagualtion clinic. take 2mg  daily except monday and friday he takes 1mg     Metoprolol Succinate 25 Mg Xr24h-tab (Metoprolol succinate) .Marland Kitchen... Take one tablet by mouth daily  Patient Instructions: 1)  Your physician recommends that you schedule a follow-up appointment in: 3 months with Dr. Shirlee Latch. 2)  You will need FASTING labwork the day of your appointment with Dr. Shirlee Latch: lipid/liver/cbc/bmet/tsh (414.01).

## 2010-10-24 NOTE — Medication Information (Signed)
Summary: rov/tm  Anticoagulant Therapy  Managed by: Weston Brass, PharmD Referring MD: Juanda Chance MD, Smitty Cords PCP: dr Kenyon Ana [facility] [address] Supervising MD: Daleen Squibb MD, Maisie Fus Indication 1: Atrial Fibrillation Lab Used: [facility]  Site: [address] INR POC 2.1 INR RANGE 2.0-3.0  Dietary changes: no    Health status changes: no    Bleeding/hemorrhagic complications: no    Recent/future hospitalizations: no    Any changes in medication regimen? no    Recent/future dental: no  Any missed doses?: no       Is patient compliant with meds? yes       Allergies: 1)  ! Crestor 2)  Vytorin 3)  Atenolol  Anticoagulation Management History:      The patient is taking warfarin and comes in today for a routine follow up visit.  Positive risk factors for bleeding include an age of 75 years or older and presence of serious comorbidities.  The bleeding index is 'intermediate risk'.  Positive CHADS2 values include History of CHF, History of HTN, and Age > 75 years old.  His last INR was 3.4.  Anticoagulation responsible provider: Daleen Squibb MD, Maisie Fus.  INR POC: 2.1.  Cuvette Lot#: [id number].  Exp: 04/2011.    Anticoagulation Management Assessment/Plan:      The patient's current anticoagulation dose is Warfarin sodium 1 mg tabs: Use as directed by Anticoagualtion Clinic. take 2mg  daily except monday and friday he takes 1mg .  The target INR is 2.0-3.0.  The next INR is due 03/15/2010.  Anticoagulation instructions were given to patient/daughter.  Results were reviewed/authorized by Weston Brass, PharmD.  He was notified by Weston Brass PharmD.         Prior Anticoagulation Instructions: INR 2.3 Continue 2 pills everyday except 1 pill on Mondays and Fridays. Recheck in 4 weeks.   Current Anticoagulation Instructions: INR 2.1  Continue same dose of 2 tablets every day except 1 tablet on Monday and Friday

## 2010-10-24 NOTE — Medication Information (Signed)
Summary: rov/tm  Anticoagulant Therapy  Managed by: Bethena Midget, RN, BSN Referring MD: Juanda Chance MD, Bruce PCP: dr Elvina [facility] [address] Supervising MD: Daleen Squibb MD, Maisie Fus Indication 1: Atrial Fibrillation Lab Used: [facility] [place] Site: [address] INR POC 2.3 INR RANGE 2.0-3.0  Dietary changes: no    Health status changes: no    Bleeding/hemorrhagic complications: no    Recent/future hospitalizations: no    Any changes in medication regimen? no    Recent/future dental: no  Any missed doses?: no       Is patient compliant with meds? yes       Allergies: 1)  ! Crestor 2)  Vytorin 3)  Atenolol  Anticoagulation Management History:      The patient is taking warfarin and comes in today for a routine follow up visit.  Positive risk factors for bleeding include an age of 75 years or older and presence of serious comorbidities.  The bleeding index is 'intermediate risk'.  Positive CHADS2 values include History of CHF, History of HTN, and Age > 75 years old.  His last INR was 3.4.  Anticoagulation responsible provider: Daleen Squibb MD, Maisie Fus.  INR POC: 2.3.  Cuvette Lot#: [id number].  Exp: 01/2011.    Anticoagulation Management Assessment/Plan:      The patient's current anticoagulation dose is Warfarin sodium 1 mg tabs: Use as directed by Anticoagualtion Clinic. take 2mg  daily except monday and friday he takes 1mg .  The target INR is 2.0-3.0.  The next INR is due 02/16/2010.  Anticoagulation instructions were given to patient/daughter.  Results were reviewed/authorized by Bethena Midget, RN, BSN.  He was notified by Bethena Midget, RN, BSN.         Prior Anticoagulation Instructions: INR 2.4 Continue 2 pills everyday except 1 pill on Mondays and Fridays. Recheck in 3 weeks.   Current Anticoagulation Instructions: INR 2.3 Continue 2 pills everyday except 1 pill on Mondays and Fridays. Recheck in 4 weeks.  Prescriptions: WARFARIN SODIUM 1 MG TABS  (WARFARIN SODIUM) Use as directed by Anticoagualtion Clinic. take 2mg  daily except monday and friday he takes 1mg   #60 x 3   Entered by:   Bethena Midget, RN, BSN   Authorized by:   Lenoria Farrier, MD, [facility]   Signed by:   Bethena Midget, RN, BSN on 01/19/2010   Method used:   Electronically to        [facility] [phone] (retail)       [address]       [place], [place]  [address]       Ph: [phone] or [phone]       Fax: [phone]   RxID:   [id number]

## 2010-10-24 NOTE — Medication Information (Signed)
Summary: rov/sp  Anticoagulant Therapy  Managed by: Bethena Midget, RN, BSN Referring MD: Juanda Chance MD, Bruce PCP: dr Elvina [facility] [address] Supervising MD: Daleen Squibb MD, Maisie Fus Indication 1: Atrial Fibrillation Lab Used: [facility]  Site: [address] INR POC 1.6 INR RANGE 2.0-3.0  Dietary changes: no    Health status changes: no    Bleeding/hemorrhagic complications: no    Recent/future hospitalizations: no    Any changes in medication regimen? no    Recent/future dental: no  Any missed doses?: no       Is patient compliant with meds? yes      Comments: Pt fell 2 weeks ago daughter states and he did go to ER and he did fracture left hand which has splint applied.   Allergies: 1)  ! Crestor 2)  Vytorin 3)  Atenolol  Anticoagulation Management History:      The patient is taking warfarin and comes in today for a routine follow up visit.  Positive risk factors for bleeding include an age of 30 years or older and presence of serious comorbidities.  The bleeding index is 'intermediate risk'.  Positive CHADS2 values include History of CHF, History of HTN, and Age > 76 years old.  His last INR was 3.4.  Anticoagulation responsible provider: Daleen Squibb MD, Maisie Fus.  INR POC: 1.6.  Cuvette Lot#: [id number].  Exp: 06/2011.    Anticoagulation Management Assessment/Plan:      The patient's current anticoagulation dose is Warfarin sodium 1 mg tabs: Use as directed by Anticoagualtion Clinic. take 2mg  daily except monday and friday he takes 1mg .  The target INR is 2.0-3.0.  The next INR is due 04/24/2010.  Anticoagulation instructions were given to patient/daughter.  Results were reviewed/authorized by Bethena Midget, RN, BSN.  He was notified by Bethena Midget, RN, BSN.         Prior Anticoagulation Instructions: INR 2.0  Continue same dose of 2 tablets every day except 1 tablet on Monday and Friday.   Current Anticoagulation Instructions: INR 1.6 Today take  2mg s then resume 2mg s everyday except 1mg s on Mondays and Fridays. Recheck in 2 weeks.

## 2010-10-24 NOTE — Medication Information (Signed)
Summary: [name]  Anticoagulant Therapy  Managed by: Cloyde Reams, RN, BSN Referring MD: Juanda Chance MD, Bruce PCP: dr Kenyon Ana [facility] [address] Supervising MD: Daleen Squibb MD, Maisie Fus Indication 1: Atrial Fibrillation Lab Used: [facility] [place] Site: [address] INR POC 2.2 INR RANGE 2.0-3.0  Dietary changes: no    Health status changes: no    Bleeding/hemorrhagic complications: no    Recent/future hospitalizations: no    Any changes in medication regimen? yes       Details: Decr amiodarone to 100mg  daily on 11/17/09.    Recent/future dental: no  Any missed doses?: no       Is patient compliant with meds? yes       Allergies: 1)  ! Crestor 2)  Vytorin 3)  Atenolol  Anticoagulation Management History:      The patient is taking warfarin and comes in today for a routine follow up visit.  Positive risk factors for bleeding include an age of 75 years or older and presence of serious comorbidities.  The bleeding index is 'intermediate risk'.  Positive CHADS2 values include History of CHF, History of HTN, and Age > 2 years old.  His last INR was 3.4.  Anticoagulation responsible provider: Daleen Squibb MD, Maisie Fus.  INR POC: 2.2.  Cuvette Lot#: [id number].  Exp: 01/2011.    Anticoagulation Management Assessment/Plan:      The patient's current anticoagulation dose is Warfarin sodium 1 mg tabs: Use as directed by Anticoagualtion Clinic. take 2mg  daily except monday and friday he takes 1mg .  The target INR is 2.0-3.0.  The next INR is due 12/15/2009.  Anticoagulation instructions were given to patient/daughter.  Results were reviewed/authorized by Cloyde Reams, RN, BSN.  He was notified by Cloyde Reams RN.         Prior Anticoagulation Instructions: INR 2.4  Continue on same dosage 2 tablets daily except 1 tablet on Mondays and Fridays.  Recheck in 4 weeks.  Keep same appt.   Current Anticoagulation Instructions: INR 2.2  Continue on same dosage 2 tablets daily  except 1 tablet on Mondays and Fridays.  Recheck in 2 weeks.

## 2010-10-24 NOTE — Medication Information (Signed)
Summary: rov/sl  Anticoagulant Therapy  Managed by: Reina Fuse, PharmD Referring MD: Juanda Chance MD, Bruce PCP: dr Kenyon Ana [facility] [address] Supervising MD: Ladona Ridgel MD,Gregg Indication 1: Atrial Fibrillation Lab Used: [facility] [place] Site: [address] INR POC 2.1 INR RANGE 2.0-3.0  Dietary changes: no    Health status changes: no    Bleeding/hemorrhagic complications: no    Recent/future hospitalizations: no    Any changes in medication regimen? no    Recent/future dental: no  Any missed doses?: no       Is patient compliant with meds? yes       Allergies: 1)  ! Crestor 2)  Vytorin 3)  Atenolol  Anticoagulation Management History:      The patient is taking warfarin and comes in today for a routine follow up visit.  Positive risk factors for bleeding include an age of 75 years or older and presence of serious comorbidities.  The bleeding index is 'intermediate risk'.  Positive CHADS2 values include History of CHF, History of HTN, and Age > 48 years old.  His last INR was 3.4.  Anticoagulation responsible provider: Ladona Ridgel MD,Gregg.  INR POC: 2.1.  Cuvette Lot#: [id number].  Exp: 06/2011.    Anticoagulation Management Assessment/Plan:      The patient's current anticoagulation dose is Warfarin sodium 1 mg tabs: Use as directed by Anticoagualtion Clinic. take 2mg  daily except monday and friday he takes 1mg .  The target INR is 2.0-3.0.  The next INR is due 06/29/2010.  Anticoagulation instructions were given to patient/daughter.  Results were reviewed/authorized by Reina Fuse, PharmD.  He was notified by Cloyde Reams RN.         Prior Anticoagulation Instructions: INR 2.0  Continue taking Coumadin 2 tabs (2 mg) on all days except Coumadin 1 tabs (1 mg) on Fridays.  Return to clinic in 3 weeks.   Current Anticoagulation Instructions: INR 2.1 No changes today. Keep taking 2 tablets everyday except take 1 tablet on friday. See you in 4 weeks.

## 2010-10-24 NOTE — Progress Notes (Signed)
Summary: talk to Dr Juanda Chance   Phone Note From Other Clinic   Caller: nurse Morrie Sheldon Summary of Call: Dr Milus Glazier would like to speak to Dr Juanda Chance concerning this patient. [phone] in ofc til 12 today & tomorrow 12-6 Initial call taken by: Edman Circle,  November 17, 2009 9:14 AM  Follow-up for Phone Call        I spoke with our scheduler, Asher Muir, who took this message. She did make Dr. Loma Boston office aware that Dr. Juanda Chance is not in the office until this afternoon. I will make Dr. Juanda Chance aware to call. Follow-up by: Sherri Rad, RN, BSN,  November 17, 2009 9:28 AM  Additional Follow-up for Phone Call Additional follow up Details #1::        Dr. Juanda Chance called and spoke with Dr. Milus Glazier regarding the pt. Dr. Milus Glazier has decreased the pt's amiodarone back to 100mg .  Additional Follow-up by: Sherri Rad, RN, BSN,  November 18, 2009 3:26 PM    New/Updated Medications: AMIODARONE HCL 200 MG TABS (AMIODARONE HCL) Take 1/2  tablet by mouth daily

## 2010-10-24 NOTE — Medication Information (Signed)
Summary: rov/sel  Anticoagulant Therapy  Managed by: Weston Brass, PharmD Referring MD: Juanda Chance MD, Smitty Cords PCP: dr Kenyon Ana [facility] [address] Supervising MD: Riley Kill MD, Maisie Fus Indication 1: Atrial Fibrillation Lab Used: [facility] [place] Site: [address] INR POC 2.4 INR RANGE 2.0-3.0  Dietary changes: no    Health status changes: no    Bleeding/hemorrhagic complications: no    Recent/future hospitalizations: no    Any changes in medication regimen? no    Recent/future dental: no  Any missed doses?: no       Is patient compliant with meds? yes       Allergies: 1)  ! Crestor 2)  Vytorin 3)  Atenolol  Anticoagulation Management History:      The patient is taking warfarin and comes in today for a routine follow up visit.  Positive risk factors for bleeding include an age of 75 years or older and presence of serious comorbidities.  The bleeding index is 'intermediate risk'.  Positive CHADS2 values include History of CHF, History of HTN, and Age > 85 years old.  His last INR was 3.4.  Anticoagulation responsible provider: Riley Kill MD, Maisie Fus.  INR POC: 2.4.  Cuvette Lot#: [id number].  Exp: 08/2011.    Anticoagulation Management Assessment/Plan:      The patient's current anticoagulation dose is Warfarin sodium 1 mg tabs: Use as directed by Anticoagualtion Clinic. take 2mg  daily except monday and friday he takes 1mg .  The target INR is 2.0-3.0.  The next INR is due 08/24/2010.  Anticoagulation instructions were given to patient/daughter.  Results were reviewed/authorized by Weston Brass, PharmD.  He was notified by Hoy Register, PharmD Candidate.         Prior Anticoagulation Instructions: INR 2.1  Continue taking 2 tablets everyday except 1 tablet on Friday. Recheck 4 weeks.  Current Anticoagulation Instructions: INR 2.4  Continue previous dose of 2 tablets everyday except 1 tablet on Friday.  Recheck INR in 4 weeks.

## 2010-10-24 NOTE — Progress Notes (Signed)
Summary: FYI-need OV??  Dr. Avel Sensor,  I did not know this pt had switched PCP.  I only asked the schedulers to make an appt for pt because we rec a RF request for his thyroid med.  Do you want to see pt?  See below:  --- Converted from flag ---- ---- 08/08/2010 2:49 PM, Verdell Face wrote: daughter states her dad switched to another md 2 yrs ago but will come back to dr plotnikov if dr wants him too, daughter confused as she said dr Macario Golds knows he changed to another md...  ---- 08/08/2010 10:20 AM, Lanier Prude, CMA(AAMA) wrote: Please sched pt for OV with PCP. He was alst seen in 2009.  Thx! ------------------------------       Additional Follow-up for Phone Call Additional follow up Details #2::    No. He has switch to another MD. Please change his status to "Inactive" Follow-up by: Tresa Garter MD,  August 09, 2010 12:38 PM

## 2010-10-24 NOTE — Medication Information (Signed)
Summary: rov/sel  Anticoagulant Therapy  Managed by: Weston Brass, PharmD Referring MD: Juanda Chance MD, Smitty Cords PCP: dr Kenyon Ana [facility] [address] Supervising MD: Clifton James MD, Cristal Deer Indication 1: Atrial Fibrillation Lab Used: [facility] [place] Site: [address] INR POC 2.1 INR RANGE 2.0-3.0  Dietary changes: no    Health status changes: no    Bleeding/hemorrhagic complications: no    Recent/future hospitalizations: no    Any changes in medication regimen? no    Recent/future dental: no  Any missed doses?: no       Is patient compliant with meds? yes       Allergies: 1)  ! Crestor 2)  Vytorin 3)  Atenolol  Anticoagulation Management History:      The patient is taking warfarin and comes in today for a routine follow up visit.  Positive risk factors for bleeding include an age of 75 years or older and presence of serious comorbidities.  The bleeding index is 'intermediate risk'.  Positive CHADS2 values include History of CHF, History of HTN, and Age > 57 years old.  His last INR was 3.4.  Anticoagulation responsible provider: Clifton James MD, Cristal Deer.  INR POC: 2.1.  Cuvette Lot#: [id number].  Exp: 07/2011.    Anticoagulation Management Assessment/Plan:      The patient's current anticoagulation dose is Warfarin sodium 1 mg tabs: Use as directed by Anticoagualtion Clinic. take 2mg  daily except monday and friday he takes 1mg .  The target INR is 2.0-3.0.  The next INR is due 07/26/2010.  Anticoagulation instructions were given to patient/daughter.  Results were reviewed/authorized by Weston Brass, PharmD.  He was notified by Kalman Shan.         Prior Anticoagulation Instructions: INR 2.1 No changes today. Keep taking 2 tablets everyday except take 1 tablet on friday. See you in 4 weeks.  Current Anticoagulation Instructions: INR 2.1  Continue taking 2 tablets everyday except 1 tablet on Friday. Recheck 4 weeks.

## 2010-10-24 NOTE — Medication Information (Signed)
Summary: [name]  Anticoagulant Therapy  Managed by: Eda Keys, PharmD Referring MD: Juanda Chance MD, Bruce PCP: dr Kenyon Ana [facility] [address] Supervising MD: Daleen Squibb MD, Maisie Fus Indication 1: Atrial Fibrillation Lab Used: [facility] [place] Site: [address] INR POC 2.0 INR RANGE 2.0-3.0  Dietary changes: no    Health status changes: no    Bleeding/hemorrhagic complications: yes       Details: one minor nosebleed lasting  ~10 min  Recent/future hospitalizations: no    Any changes in medication regimen? yes       Details: pt started amio 100 daily 1 month ago  Recent/future dental: no  Any missed doses?: no       Is patient compliant with meds? yes       Allergies: 1)  ! Crestor 2)  Vytorin 3)  Atenolol  Anticoagulation Management History:      The patient is taking warfarin and comes in today for a routine follow up visit.  Positive risk factors for bleeding include an age of 23 years or older and presence of serious comorbidities.  The bleeding index is 'intermediate risk'.  Positive CHADS2 values include History of CHF, History of HTN, and Age > 65 years old.  His last INR was 3.4.  Anticoagulation responsible provider: Daleen Squibb MD, Maisie Fus.  INR POC: 2.0.  Exp: 01/2011.    Anticoagulation Management Assessment/Plan:      The patient's current anticoagulation dose is Warfarin sodium 1 mg tabs: Use as directed by Anticoagualtion Clinic. take 2mg  daily except monday and friday he takes 1mg .  The target INR is 2.0-3.0.  The next INR is due 12/29/2009.  Anticoagulation instructions were given to patient/daughter.  Results were reviewed/authorized by Eda Keys, PharmD.  He was notified by Eda Keys.         Prior Anticoagulation Instructions: INR 2.2  Continue on same dosage 2 tablets daily except 1 tablet on Mondays and Fridays.  Recheck in 2 weeks.    Current Anticoagulation Instructions: INR 2.0  Take an extra 1/2 tablet today  (total of 2.5 tablets).  Then return to normal dosing schedule of 1 tablet on Monday and Friday and 2 tablets all other days.  Return to clinic in 2 weeks.  Prescriptions: WARFARIN SODIUM 1 MG TABS (WARFARIN SODIUM) Use as directed by Anticoagualtion Clinic. take 2mg  daily except monday and friday he takes 1mg   #60 x 3   Entered by:   Eda Keys   Authorized by:   Lenoria Farrier, MD, [facility]   Signed by:   Eda Keys on 12/15/2009   Method used:   [facility]        [facility]  [address] [phone]* (retail)       [address]       [place], [place]  [address]       Ph: [phone] or [phone]       Fax: [phone]   RxID:   [id number]

## 2010-10-24 NOTE — Assessment & Plan Note (Signed)
Summary: [name]   Visit Type:  Follow-up Primary Judye Lorino:  dr Kenyon Ana [facility] [address]   History of Present Illness: The patient is 77 years old and return for management of CHF and atrial fibrillation. He has a history of paroxysmal fibrillation which has been managed with amiodarone and Coumadin. We cut his amiodarone dose down to 100 mg a day because of a questionable rash. He also has had systolic CHF and has an ejection fraction of 40% with 2+ mitral regurgitation by echo. He was hospitalized in June of 2010 with an ammonia and these other problems as well.  He has done quite well since his last visit here. He's had no chest pain and minimal palpitations and shortness of breath. He has had no fluid accumulation.  He does have chronic renal insufficiency with creatinines in the range of 1.4. He has had a history of nonobstructive carotid disease at catheterization in 2006.  Current Medications (verified): 1)  Levothroid 100 Mcg Tabs (Levothyroxine Sodium) .Marland Kitchen.. 1 Qd 2)  Flomax 0.4 Mg Cp24 (Tamsulosin Hcl) .Marland Kitchen.. 1 Qd 3)  Furosemide 40 Mg Tabs (Furosemide) .... Take One Tablet By Mouth Daily. 4)  Zolpidem Tartrate 10 Mg  Tabs (Zolpidem Tartrate) .... 1/2 or 1 By Mouth At [facility] Prn 5)  Warfarin Sodium 1 Mg Tabs (Warfarin Sodium) .... Use As Directed By [employer]. Take 2mg  Daily Except Monday and Friday He Takes 1mg  6)  Metoprolol Succinate 25 Mg Xr24h-Tab (Metoprolol Succinate) .... Take One Tablet By Mouth Daily 7)  Klor-Con 20 Meq Pack (Potassium Chloride) .Marland Kitchen.. 1 Tab Once Daily 8)  Amiodarone Hcl 200 Mg Tabs (Amiodarone Hcl) .... Take 1/2  Tablet By Mouth Daily 9)  Fish Oil 1200 Mg Caps (Omega-3 Fatty Acids) .... Take 1 Capsule By Mouth Two Times A Day 10)  Coq10 100 Mg Caps (Coenzyme Q10) .... Take 1 Capsule By Mouth Once A Day 11)  Senokot S 8.6-50 Mg Tabs (Sennosides-Docusate Sodium) .... Take 1 Tablet By Mouth Once A Day 12)  Centrum Silver  Tabs (Multiple  Vitamins-Minerals) .... Take 1 Tablet Daily  Allergies (verified): 1)  ! Crestor 2)  Vytorin 3)  Atenolol  Past History:  Past Medical History: Reviewed history from 04/04/2009 and no changes required. Coronary artery disease Hyperlipidemia Hypothyroidism Peripheral vascular disease Anxiety Skin cancer, hx of 2008 [facility] Colonic polyps, hx of Skin cancer, hx of 1. Atrial fibrillation with rapid ventricular response treated with     amiodarone therapy converting to normal sinus rhythm 02/2009 2. 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 3. Pneumonia treated with Levaquin and BiPAP for hypoxia 02/2009. 4. Chronic renal insufficiency.  Creatinine at time of discharge 1.44     with a BUN of 22. 5. Deconditioning.  The patient underwent cardiac rehab assistance and     a nutritional consult assistance during this hospitalization 02/2009 6. Severe mitral regurgitation grade 3/4, confirmed by cardiac     catheterization and 2-D echocardiogram obtained this admission.     The patient is not interested in pursuing further surgical     treatment of his mitral regurgitation. 7. Coronary artery disease, status post cardiac catheterization April     2006, EF of 30-40% at that time with 2 to 3+ mitral regurgitation     and otherwise mostly nonobstructive coronary artery disease with a     90%  stenosis in the distal PDA.  Past medical history includes     hypertension, hyperlipidemia, hypothyroidism, chronic renal     insufficiency, asymptomatic PVCs.  Review of Systems       ROS is negative except as outlined in HPI.   Vital Signs:  Patient profile:   77 year old male Height:      66 inches Weight:      139 pounds BMI:     22.52 Pulse rate:   62 / minute BP sitting:   119 / 71  (left arm) Cuff size:   regular  Vitals Entered By: Burnett Kanaris, CNA (May 09, 2010  4:12 PM)  Physical Exam  Additional Exam:  Gen. Well-nourished, in no distress   Neck: No JVD, thyroid not enlarged, no carotid bruits Lungs: No tachypnea, clear without rales, rhonchi or wheezes Cardiovascular: Rhythm regular, PMI not displaced,  heart sounds  normal, no murmurs or gallops, no peripheral edema, pulses normal in all 4 extremities. Abdomen: BS normal, abdomen soft and non-tender without masses or organomegaly, no hepatosplenomegaly. MS: No deformities, no cyanosis or clubbing   Neuro:  No focal sns   Skin:  no lesions    Impression & Recommendations:  Problem # 1:  SYSTOLIC HEART FAILURE, CHRONIC (ICD-428.22) Euvolemic today.  Stable.  Continue current meds. His updated medication list for this problem includes:    Furosemide 40 Mg Tabs (Furosemide) .Marland Kitchen... Take one tablet by mouth daily.    Warfarin Sodium 1 Mg Tabs (Warfarin sodium) ..... Use as directed by anticoagualtion clinic. take 2mg  daily except monday and friday he takes 1mg     Metoprolol Succinate 25 Mg Xr24h-tab (Metoprolol succinate) .Marland Kitchen... Take one tablet by mouth daily    Amiodarone Hcl 200 Mg Tabs (Amiodarone hcl) .Marland Kitchen... Take 1/2  tablet by mouth daily  Problem # 2:  ATRIAL FIBRILLATION (ICD-427.31) Has had no smyptomatic recurrence.  Continue current Rx. His updated medication list for this problem includes:    Warfarin Sodium 1 Mg Tabs (Warfarin sodium) ..... Use as directed by anticoagualtion clinic. take 2mg  daily except monday and friday he takes 1mg     Metoprolol Succinate 25 Mg Xr24h-tab (Metoprolol succinate) .Marland Kitchen... Take one tablet by mouth daily    Amiodarone Hcl 200 Mg Tabs (Amiodarone hcl) .Marland Kitchen... Take 1/2  tablet by mouth daily  Problem # 3:  HYPERTENSION, BENIGN (ICD-401.1) Controlled on current meds.  May need to cut back if BP gets lower. His updated medication list for this problem includes:    Furosemide 40 Mg Tabs (Furosemide) .Marland Kitchen... Take one tablet by mouth daily.    Metoprolol Succinate  25 Mg Xr24h-tab (Metoprolol succinate) .Marland Kitchen... Take one tablet by mouth daily  Patient Instructions: 1)  Your physician recommends that you schedule a follow-up appointment in: 3 months. 2)  Your physician recommends that you return for lab work the day of your next coumadin check: bmp/cbc (427.31;414.01;428.22) 3)  Your physician recommends that you continue on your current medications as directed. Please refer to the Current Medication list given to you today.

## 2010-10-24 NOTE — Medication Information (Signed)
Summary: [name]  Anticoagulant Therapy  Managed by: Bethena Midget, RN, BSN Referring MD: Juanda Chance MD, Zabria Liss PCP: dr Elvina Sidle [facility] [address] Supervising MD: Juanda Chance MD, Casimiro Lienhard Indication 1: Atrial Fibrillation Lab Used: [facility] [place] Site: [address] INR POC 2.5 INR RANGE 2.0-3.0  Dietary changes: no    Health status changes: no    Bleeding/hemorrhagic complications: no    Recent/future hospitalizations: no    Any changes in medication regimen? no    Recent/future dental: no  Any missed doses?: no       Is patient compliant with meds? yes      Comments: Intermittent rash that appears on inner thighs. Daughter states pt. wishes not to decrease Amiodarone dose at this time.   Allergies: 1)  ! Crestor 2)  Vytorin 3)  Atenolol  Anticoagulation Management History:      The patient is taking warfarin and comes in today for a routine follow up visit.  Positive risk factors for bleeding include an age of 15 years or older and presence of serious comorbidities.  The bleeding index is 'intermediate risk'.  Positive CHADS2 values include History of CHF, History of HTN, and Age > 60 years old.  His last INR was 3.4.  Anticoagulation responsible provider: Juanda Chance MD, Smitty Cords.  INR POC: 2.5.  Cuvette Lot#: [id number].  Exp: 10/2010.    Anticoagulation Management Assessment/Plan:      The patient's current anticoagulation dose is Warfarin sodium 1 mg tabs: Use as directed by Anticoagualtion Clinic. take 2mg  daily except monday and friday he takes 1mg .  The target INR is 2.0-3.0.  The next INR is due 10/27/2009.  Anticoagulation instructions were given to patient/daughter.  Results were reviewed/authorized by Bethena Midget, RN, BSN.  He was notified by Bethena Midget, RN, BSN.         Prior Anticoagulation Instructions: INR 2.4  Continue on same dosage 2 tablets daily except 1 tablet on Mondays and Fridays.  Recheck in 4 weeks.    Current Anticoagulation  Instructions: INR 2.5 Continue 2mg s daily except 1mg s on Mondays and Fridays. Recheck in 4 weeks.

## 2010-10-24 NOTE — Medication Information (Signed)
Summary: rov/nb  Anticoagulant Therapy  Managed by: Bethena Midget, RN, BSN Referring MD: Juanda Chance MD, Bruce PCP: dr Elvina [facility]le [facility] [address] Supervising MD: Eden Emms MD, Theron Arista Indication 1: Atrial Fibrillation Lab Used: [facility] [place] Site: [address] INR POC 2.4 INR RANGE 2.0-3.0  Dietary changes: no    Health status changes: no    Bleeding/hemorrhagic complications: no    Recent/future hospitalizations: no    Any changes in medication regimen? no    Recent/future dental: no  Any missed doses?: no       Is patient compliant with meds? yes       Allergies: 1)  ! Crestor 2)  Vytorin 3)  Atenolol  Anticoagulation Management History:      The patient is taking warfarin and comes in today for a routine follow up visit.  Positive risk factors for bleeding include an age of 4 years or older and presence of serious comorbidities.  The bleeding index is 'intermediate risk'.  Positive CHADS2 values include History of CHF, History of HTN, and Age > 62 years old.  His last INR was 3.4.  Anticoagulation responsible provider: Eden Emms MD, Theron Arista.  INR POC: 2.4.  Cuvette Lot#: [id number].  Exp: 08/2011.    Anticoagulation Management Assessment/Plan:      The patient's current anticoagulation dose is Warfarin sodium 1 mg tabs: Use as directed by Anticoagualtion Clinic. take 2mg  daily except monday and friday he takes 1mg .  The target INR is 2.0-3.0.  The next INR is due 09/20/2010.  Anticoagulation instructions were given to patient/daughter.  Results were reviewed/authorized by Bethena Midget, RN, BSN.  He was notified by Bethena Midget, RN, BSN.         Prior Anticoagulation Instructions: INR 2.4  Continue previous dose of 2 tablets everyday except 1 tablet on Friday.  Recheck INR in 4 weeks.    Current Anticoagulation Instructions: INR 2.4 Continue 2 pills everyday except 1 pill on Fridays. Recheck in 4 weeks.  Prescriptions: WARFARIN SODIUM 1 MG  TABS (WARFARIN SODIUM) Use as directed by Anticoagualtion Clinic. take 2mg  daily except monday and friday he takes 1mg   #60 Tablet x 3   Entered by:   Bethena Midget, RN, BSN   Authorized by:   Lenoria Farrier, MD, [facility]   Signed by:   Bethena Midget, RN, BSN on 08/24/2010   Method used:   [facility]        [facility] [address] [phone]* (retail)       [address]       [place], [place]  [address]       Ph: [phone] or [phone]       Fax: [phone]   RxID:   [id number]

## 2010-10-24 NOTE — Assessment & Plan Note (Signed)
Summary: f28m   Visit Type:  Follow-up Primary Provider:  dr Kenyon Ana [facility] [address]  CC:  no complaints pt wants to talk about amiodarone and possibly having a reaction.  History of Present Illness: Patient is 77 years old returned for management of atrial fibrillation and systolic heart failure. He was hospitalized last June with pneumonia, atrial fibrillation, and heart failure. At that time his ejection fraction was 40% and he had 2+ mitral regurgitation. He was treated with amiodarone and Coumadin.  He has done well since that time. His had no recent swelling chest pain or shortness of breath. He did develop a facial rash a few weeks ago. He thought this may be due to amiodarone. He saw a dermatologist who didn't think so and given some medication and it improved. He saw Dr. Faustino Congress yesterday and he decreased the amiodarone 200-100 after discussing this with me.    Current Medications (verified): 1)  Levothroid 100 Mcg Tabs (Levothyroxine Sodium) .Marland Kitchen.. 1 Qd 2)  Flomax 0.4 Mg Cp24 (Tamsulosin Hcl) .Marland Kitchen.. 1 Qd 3)  Furosemide 40 Mg Tabs (Furosemide) .... Take One Tablet By Mouth Daily. 4)  Zolpidem Tartrate 10 Mg  Tabs (Zolpidem Tartrate) .... 1/2 or 1 By Mouth At [facility] Prn 5)  Warfarin Sodium 1 Mg Tabs (Warfarin Sodium) .... Use As Directed By [employer]. Take 2mg  Daily Except Monday and Friday He Takes 1mg  6)  Metoprolol Succinate 25 Mg Xr24h-Tab (Metoprolol Succinate) .... Take One Tablet By Mouth Daily 7)  Klor-Con 20 Meq Pack (Potassium Chloride) .Marland Kitchen.. 1 Tab Once Daily 8)  Amiodarone Hcl 200 Mg Tabs (Amiodarone Hcl) .... Take 1/2  Tablet By Mouth Daily 9)  Fish Oil 1200 Mg Caps (Omega-3 Fatty Acids) .... Take 1 Capsule By Mouth Two Times A Day 10)  Coq10 100 Mg Caps (Coenzyme Q10) .... Take 1 Capsule By Mouth Once A Day 11)  Senokot S 8.6-50 Mg Tabs (Sennosides-Docusate Sodium) .... Take 1 Tablet By Mouth Once A Day  Allergies (verified): 1)  ! Crestor 2)   Vytorin 3)  Atenolol  Past History:  Past Medical History: Reviewed history from 04/04/2009 and no changes required. Coronary artery disease Hyperlipidemia Hypothyroidism Peripheral vascular disease Anxiety Skin cancer, hx of 2008 [facility] Colonic polyps, hx of Skin cancer, hx of 1. Atrial fibrillation with rapid ventricular response treated with     amiodarone therapy converting to normal sinus rhythm 02/2009 2. 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 3. Pneumonia treated with Levaquin and BiPAP for hypoxia 02/2009. 4. Chronic renal insufficiency.  Creatinine at time of discharge 1.44     with a BUN of 22. 5. Deconditioning.  The patient underwent cardiac rehab assistance and     a nutritional consult assistance during this hospitalization 02/2009 6. Severe mitral regurgitation grade 3/4, confirmed by cardiac     catheterization and 2-D echocardiogram obtained this admission.     The patient is not interested in pursuing further surgical     treatment of his mitral regurgitation. 7. Coronary artery disease, status post cardiac catheterization April     2006, EF of 30-40% at that time with 2 to 3+ mitral regurgitation     and otherwise mostly nonobstructive coronary artery disease with a     90% stenosis in the distal PDA.  Past medical history includes     hypertension, hyperlipidemia, hypothyroidism,  chronic renal     insufficiency, asymptomatic PVCs.  Review of Systems       ROS is negative except as outlined in HPI.   Vital Signs:  Patient profile:   77 year old male Height:      66 inches Weight:      140 pounds BMI:     22.68 Pulse rate:   62 / minute BP sitting:   129 / 77  (left arm) Cuff size:   regular  Vitals Entered By: Burnett Kanaris, CNA (November 23, 2009 3:57 PM)  Physical Exam  Additional Exam:  Gen. Well-nourished, in no distress   Neck:  No JVD, thyroid not enlarged, no carotid bruits Lungs: No tachypnea, clear without rales, rhonchi or wheezes Cardiovascular: Rhythm regular, PMI not displaced,  heart sounds  normal, no murmurs or gallops, no peripheral edema, pulses normal in all 4 extremities. Abdomen: BS normal, abdomen soft and non-tender without masses or organomegaly, no hepatosplenomegaly. MS: No deformities, no cyanosis or clubbing   Neuro:  No focal sns   Skin:  no lesions    Impression & Recommendations:  Problem # 1:  ATRIAL FIBRILLATION (ICD-427.31) He has paroxysmal atrial fibrillation that is managed with amiodarone and Coumadin. He has had no symptomatic recurrences since his hospitalization last June. He was concerned amiodarone might be contributing to a facial rash. I don't think so but I think it is fine to decrease his dose 200 mg daily as was done yesterday by his primary care physician after consultation with me.  His updated medication list for this problem includes:    Warfarin Sodium 1 Mg Tabs (Warfarin sodium) ..... Use as directed by anticoagualtion clinic. take 2mg  daily except monday and friday he takes 1mg     Metoprolol Succinate 25 Mg Xr24h-tab (Metoprolol succinate) .Marland Kitchen... Take one tablet by mouth daily    Amiodarone Hcl 200 Mg Tabs (Amiodarone hcl) .Marland Kitchen... Take 1/2  tablet by mouth daily  Problem # 2:  SYSTOLIC HEART FAILURE, CHRONIC (ICD-428.22) He has a history of systolic heart failure and mitral regurgitation. He appears euvolemic and compensated today. We will continue current therapy. His updated medication list for this problem includes:    Furosemide 40 Mg Tabs (Furosemide) .Marland Kitchen... Take one tablet by mouth daily.    Warfarin Sodium 1 Mg Tabs (Warfarin sodium) ..... Use as directed by anticoagualtion clinic. take 2mg  daily except monday and friday he takes 1mg     Metoprolol Succinate 25 Mg Xr24h-tab (Metoprolol succinate) .Marland Kitchen... Take one tablet by mouth daily    Amiodarone Hcl 200 Mg Tabs  (Amiodarone hcl) .Marland Kitchen... Take 1/2  tablet by mouth daily  Problem # 3:  HYPERTENSION, BENIGN (ICD-401.1) This is well controlled on current medications. His updated medication list for this problem includes:    Furosemide 40 Mg Tabs (Furosemide) .Marland Kitchen... Take one tablet by mouth daily.    Metoprolol Succinate 25 Mg Xr24h-tab (Metoprolol succinate) .Marland Kitchen... Take one tablet by mouth daily  Problem # 4:  CORONARY ARTERY DISEASE (ICD-414.00) He had nonobstructive disease at catheterization several years ago. His updated medication list for this problem includes:    Warfarin Sodium 1 Mg Tabs (Warfarin sodium) ..... Use as directed by anticoagualtion clinic. take 2mg  daily except monday and friday he takes 1mg     Metoprolol Succinate 25 Mg Xr24h-tab (Metoprolol succinate) .Marland Kitchen... Take one tablet by mouth daily  Patient Instructions: 1)  Your physician recommends that you schedule a follow-up appointment in: 2 months 2)  Your physician  has recommended you make the following change in your medication: 1) decrease amiodarone to 100mg  once daily , 3)  2) start xanax 0.25mg  once daily as needed for anxiety 4)  You will need to have a coumadin check in 1 week due to the change in your amiodarone. Prescriptions: ALPRAZOLAM 0.25 MG TABS (ALPRAZOLAM) one tab by mouth once daily as needed for anxiety  #25 x 0   Entered by:   Sherri Rad, RN, BSN   Authorized by:   Lenoria Farrier, MD, [facility]   Signed by:   Sherri Rad, RN, BSN on 11/23/2009   Method used:   Telephoned to ...       [facility] [address] [phone]* (retail)       [address]       [place], [place]  [address]       Ph: [phone] or [phone]       Fax: [phone]   RxID:   [id number]

## 2010-10-24 NOTE — Progress Notes (Signed)
Summary: questions re labs/patient has a rash-due to meds?  Phone Note Call from Patient Call back at [phone]   Caller: Patient Reason for Call: Talk to Nurse Summary of Call: Patient has a rash, due to medications?  Has questions about labs. Initial call taken by: Burnard Leigh,  September 26, 2009 10:44 AM  Follow-up for Phone Call        I called and spoke with the pt's daughter. The pt has developed a rash that occurs in different places on her body for about 2 weeks now.  He has started itching. He has had no change in his medications, diet, detergent, or soaps. Per the pt's daughter, the rash will come at different times of the day. It will last a couple of hours then go away. They question is this is medication related. The pt is on coumadin, but his dose has not been adjusted recently. He is due for a coumadin appt. 09/27/09. I will discuss with Dr. Juanda Chance and call the pt back. The pt's daughter is agreeable. Sherri Rad, RN, BSN  September 26, 2009 2:11 PM   I discussed the above with Dr. Juanda Chance. He feels that it is unlikely that the pt's rash is medication related, however, he would like the pt to decrease his amiodarone to 100mg  once daily and f/u with Dr. Milus Glazier. The pt's daughter is aware. He is scheduled for a coumadin visit tomorrow and we will make sure the coumadin clinic is aware of our recommendations.  Follow-up by: Sherri Rad, RN, BSN,  September 26, 2009 5:58 PM  Additional Follow-up for Phone Call Additional follow up Details #1::        I d/w pt and dtr in clinic.  They are not willing at this time to d/c amio b/c pt feels "really great".  They will monitor rash and let us know if it worsens or changes.  mep Additional Follow-up by: Shelby Dubin, PharmD, BCPS, CPP

## 2010-10-24 NOTE — Medication Information (Signed)
Summary: bleeding per pt phone call/ewj  Anticoagulant Therapy  Managed by: Cloyde Reams, RN, BSN Referring MD: Juanda Chance MD, Bruce PCP: dr Kenyon Ana [facility] [address] Supervising MD: Johney Frame MD, Fayrene Fearing Indication 1: Atrial Fibrillation Lab Used: [facility] [place] Site: [address] INR POC 2.4 INR RANGE 2.0-3.0  Dietary changes: no    Health status changes: no    Bleeding/hemorrhagic complications: no    Recent/future hospitalizations: no    Any changes in medication regimen? no    Recent/future dental: no  Any missed doses?: no       Is patient compliant with meds? yes      Comments: Concerned about redness and rash that appeared on pt's face yesterday.    Allergies (verified): 1)  ! Crestor 2)  Vytorin 3)  Atenolol  Anticoagulation Management History:      The patient is taking warfarin and comes in today for a routine follow up visit.  Positive risk factors for bleeding include an age of 75 years or older and presence of serious comorbidities.  The bleeding index is 'intermediate risk'.  Positive CHADS2 values include History of CHF, History of HTN, and Age > 10 years old.  His last INR was 3.4.  Anticoagulation responsible provider: Allred MD, Fayrene Fearing.  INR POC: 2.4.  Cuvette Lot#: [id number].  Exp: 12/2010.    Anticoagulation Management Assessment/Plan:      The patient's current anticoagulation dose is Warfarin sodium 1 mg tabs: Use as directed by Anticoagualtion Clinic. take 2mg  daily except monday and friday he takes 1mg .  The target INR is 2.0-3.0.  The next INR is due 12/08/2009.  Anticoagulation instructions were given to patient/daughter.  Results were reviewed/authorized by Cloyde Reams, RN, BSN.         Prior Anticoagulation Instructions: INR 2.5  Continue on same dosage 2 tablets daily except 1 tablet on Mondays and Fridays. Recheck in 4 weeks.    Current Anticoagulation Instructions: INR 2.4  Continue on same dosage 2 tablets  daily except 1 tablet on Mondays and Fridays.  Recheck in 4 weeks.  Keep same appt.

## 2010-10-24 NOTE — Medication Information (Signed)
Summary: rov/ewj  Anticoagulant Therapy  Managed by: Reina Fuse, PharmD Referring MD: Juanda Chance MD, Bruce PCP: dr Kenyon Ana [facility] [address] Supervising MD: Myrtis Ser MD, Tinnie Gens Indication 1: Atrial Fibrillation Lab Used: [facility] [place] Site: [address] INR POC 2.0 INR RANGE 2.0-3.0  Dietary changes: no    Health status changes: no    Bleeding/hemorrhagic complications: no    Recent/future hospitalizations: no    Any changes in medication regimen? no    Recent/future dental: no  Any missed doses?: no       Is patient compliant with meds? yes       Allergies: 1)  ! Crestor 2)  Vytorin 3)  Atenolol  Anticoagulation Management History:      The patient is taking warfarin and comes in today for a routine follow up visit.  Positive risk factors for bleeding include an age of 75 years or older and presence of serious comorbidities.  The bleeding index is 'intermediate risk'.  Positive CHADS2 values include History of CHF, History of HTN, and Age > 75 years old.  His last INR was 3.4.  Anticoagulation responsible provider: Myrtis Ser MD, Tinnie Gens.  INR POC: 2.0.  Cuvette Lot#: [id number].  Exp: 06/2011.    Anticoagulation Management Assessment/Plan:      The patient's current anticoagulation dose is Warfarin sodium 1 mg tabs: Use as directed by Anticoagualtion Clinic. take 2mg  daily except monday and friday he takes 1mg .  The target INR is 2.0-3.0.  The next INR is due 06/01/2010.  Anticoagulation instructions were given to patient/daughter.  Results were reviewed/authorized by Reina Fuse, PharmD.  He was notified by Reina Fuse PharmD.         Prior Anticoagulation Instructions: INR 1.8  Take 2 tablets today, then start taking 2 tablets daily except 1 tablet on Fridays.  Recheck in 2 weeks.    Current Anticoagulation Instructions: INR 2.0  Continue taking Coumadin 2 tabs (2 mg) on all days except Coumadin 1 tabs (1 mg) on Fridays.  Return to clinic in 3  weeks.

## 2010-10-24 NOTE — Medication Information (Signed)
Summary: rov/eac  Anticoagulant Therapy  Managed by: Bethena Midget, RN, BSN Referring MD: Juanda Chance MD, Bruce PCP: dr Elvina [facility] [address] Supervising MD: Eden Emms MD, Theron Arista Indication 1: Atrial Fibrillation Lab Used: [facility] [place] Site: [address] INR POC 2.4 INR RANGE 2.0-3.0  Dietary changes: no    Health status changes: no    Bleeding/hemorrhagic complications: no    Recent/future hospitalizations: no    Any changes in medication regimen? no    Recent/future dental: no  Any missed doses?: no       Is patient compliant with meds? yes       Allergies: 1)  ! Crestor 2)  Vytorin 3)  Atenolol  Anticoagulation Management History:      The patient is taking warfarin and comes in today for a routine follow up visit.  Positive risk factors for bleeding include an age of 36 years or older and presence of serious comorbidities.  The bleeding index is 'intermediate risk'.  Positive CHADS2 values include History of CHF, History of HTN, and Age > 36 years old.  His last INR was 3.4.  Anticoagulation responsible provider: Eden Emms MD, Theron Arista.  INR POC: 2.4.  Cuvette Lot#: [id number].  Exp: 01/2011.    Anticoagulation Management Assessment/Plan:      The patient's current anticoagulation dose is Warfarin sodium 1 mg tabs: Use as directed by Anticoagualtion Clinic. take 2mg  daily except monday and friday he takes 1mg .  The target INR is 2.0-3.0.  The next INR is due 01/19/2010.  Anticoagulation instructions were given to patient/daughter.  Results were reviewed/authorized by Bethena Midget, RN, BSN.  He was notified by Bethena Midget, RN, BSN.         Prior Anticoagulation Instructions: INR 2.0  Take an extra 1/2 tablet today (total of 2.5 tablets).  Then return to normal dosing schedule of 1 tablet on Monday and Friday and 2 tablets all other days.  Return to clinic in 2 weeks.   Current Anticoagulation Instructions: INR 2.4 Continue 2 pills everyday  except 1 pill on Mondays and Fridays. Recheck in 3 weeks.

## 2010-10-24 NOTE — Assessment & Plan Note (Signed)
Summary: PER CHECK OUTS/F   Visit Type:  Follow-up Primary Provider:  dr Kenyon Ana [facility] [address]  CC:  no complaints.  History of Present Illness: The patient is 77 years old and return for management of CHF and atrial fibrillation he was hospitalized in June with pneumonia, CHF, and atrial fibrillation. His ejection fraction was 40% he had 2+ mitral regurgitation. His atrial fibrillation was managed with amiodarone and Coumadin. He has done well since that time. He has had no chest pain and no palpitations. He does have some unusual shortness of breath but sounds like sighing. His wife said when they walked together that he does better than she does.  His other problems include some tremor and nervousness. He also had a rash which was thought to questionably be due to amiodarone and we decrease his dose from 200-100 mg daily.  His other major problem is chronic renal insufficiency with a creatinine of 1.4.  Current Medications (verified): 1)  Levothroid 100 Mcg Tabs (Levothyroxine Sodium) .Marland Kitchen.. 1 Qd 2)  Flomax 0.4 Mg Cp24 (Tamsulosin Hcl) .Marland Kitchen.. 1 Qd 3)  Furosemide 40 Mg Tabs (Furosemide) .... Take One Tablet By Mouth Daily. 4)  Zolpidem Tartrate 10 Mg  Tabs (Zolpidem Tartrate) .... 1/2 or 1 By Mouth At [facility] Prn 5)  Warfarin Sodium 1 Mg Tabs (Warfarin Sodium) .... Use As Directed By [employer]. Take 2mg  Daily Except Monday and Friday He Takes 1mg  6)  Metoprolol Succinate 25 Mg Xr24h-Tab (Metoprolol Succinate) .... Take One Tablet By Mouth Daily 7)  Klor-Con 20 Meq Pack (Potassium Chloride) .Marland Kitchen.. 1 Tab Once Daily 8)  Amiodarone Hcl 200 Mg Tabs (Amiodarone Hcl) .... Take 1/2  Tablet By Mouth Daily 9)  Fish Oil 1200 Mg Caps (Omega-3 Fatty Acids) .... Take 1 Capsule By Mouth Two Times A Day 10)  Coq10 100 Mg Caps (Coenzyme Q10) .... Take 1 Capsule By Mouth Once A Day 11)  Senokot S 8.6-50 Mg Tabs (Sennosides-Docusate Sodium) .... Take 1 Tablet By Mouth Once A Day 12)   Desonide Cream .... As Directed 13)  Alprazolam 0.25 Mg Tabs (Alprazolam) .... One Tab By Mouth Once Daily As Needed For Anxiety  Allergies (verified): 1)  ! Crestor 2)  Vytorin 3)  Atenolol  Past History:  Past Medical History: Reviewed history from 04/04/2009 and no changes required. Coronary artery disease Hyperlipidemia Hypothyroidism Peripheral vascular disease Anxiety Skin cancer, hx of 2008 [facility] Colonic polyps, hx of Skin cancer, hx of 1. Atrial fibrillation with rapid ventricular response treated with     amiodarone therapy converting to normal sinus rhythm 02/2009 2. 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 3. Pneumonia treated with Levaquin and BiPAP for hypoxia 02/2009. 4. Chronic renal insufficiency.  Creatinine at time of discharge 1.44     with a BUN of 22. 5. Deconditioning.  The patient underwent cardiac rehab assistance and     a nutritional consult assistance during this hospitalization 02/2009 6. Severe mitral regurgitation grade 3/4, confirmed by cardiac     catheterization and 2-D echocardiogram obtained this admission.     The patient is not interested in pursuing further surgical     treatment of his mitral regurgitation. 7. Coronary artery disease, status post cardiac catheterization April     2006, EF of 30-40% at that time with 2 to 3+ mitral regurgitation  and otherwise mostly nonobstructive coronary artery disease with a     90% stenosis in the distal PDA.  Past medical history includes     hypertension, hyperlipidemia, hypothyroidism, chronic renal     insufficiency, asymptomatic PVCs.  Review of Systems       ROS is negative except as outlined in HPI.   Vital Signs:  Patient profile:   77 year old male Height:      66 inches Weight:      139 pounds BMI:     22.52 Pulse rate:   77 / minute BP sitting:   122 / 77  (left  arm) Cuff size:   regular  Vitals Entered By: Burnett Kanaris, CNA (Jan 31, 2010 4:23 PM)  Physical Exam  Additional Exam:  Gen. Well-nourished, in no distress   Neck: No JVD, thyroid not enlarged, no carotid bruits Lungs: No tachypnea, clear without rales, rhonchi or wheezes Cardiovascular: Rhythm regular, PMI not displaced,  heart sounds  normal, no murmurs or gallops, no peripheral edema, pulses normal in all 4 extremities. Abdomen: BS normal, abdomen soft and non-tender without masses or organomegaly, no hepatosplenomegaly. MS: No deformities, no cyanosis or clubbing   Neuro:  No focal sns   Skin:  no lesions    Impression & Recommendations:  Problem # 1:  SYSTOLIC HEART FAILURE, CHRONIC (ICD-428.22) He has a history of systolic CHF with an ejection fraction of 40% and 2+ mitral regurgitation. He appears euvolemic and well compensated now. We will continue his current medications.  His updated medication list for this problem includes:    Furosemide 40 Mg Tabs (Furosemide) .Marland Kitchen... Take one tablet by mouth daily.    Warfarin Sodium 1 Mg Tabs (Warfarin sodium) ..... Use as directed by anticoagualtion clinic. take 2mg  daily except monday and friday he takes 1mg     Metoprolol Succinate 25 Mg Xr24h-tab (Metoprolol succinate) .Marland Kitchen... Take one tablet by mouth daily    Amiodarone Hcl 200 Mg Tabs (Amiodarone hcl) .Marland Kitchen... Take 1/2  tablet by mouth daily  Problem # 2:  ATRIAL FIBRILLATION (ICD-427.31) He has a history of atrial fibrillation which is managed with amiodarone and Coumadin. He has had no symptomatic recurrence and this problem appears stable. His updated medication list for this problem includes:    Warfarin Sodium 1 Mg Tabs (Warfarin sodium) ..... Use as directed by anticoagualtion clinic. take 2mg  daily except monday and friday he takes 1mg     Metoprolol Succinate 25 Mg Xr24h-tab (Metoprolol succinate) .Marland Kitchen... Take one tablet by mouth daily    Amiodarone Hcl 200 Mg Tabs (Amiodarone  hcl) .Marland Kitchen... Take 1/2  tablet by mouth daily  Problem # 3:  CORONARY ARTERY DISEASE (ICD-414.00) He has a history of CAD. His last catheterization was in 2006 at which time he had 90% stenosis in the posterior descending artery. He's had no recent chest pain this problem appears stable. His updated medication list for this problem includes:    Warfarin Sodium 1 Mg Tabs (Warfarin sodium) ..... Use as directed by anticoagualtion clinic. take 2mg  daily except monday and friday he takes 1mg     Metoprolol Succinate 25 Mg Xr24h-tab (Metoprolol succinate) .Marland Kitchen... Take one tablet by mouth daily  Patient Instructions: 1)  Your physician recommends that you schedule a follow-up appointment in: 3 MONTHS WITH DR. Juanda Chance 2)  Your physician has recommended you make the following change in your medication: start  multivitamin daily

## 2010-10-24 NOTE — Medication Information (Signed)
Summary: rov/sp  Anticoagulant Therapy  Managed by: Weston Brass, PharmD Referring MD: Juanda Chance MD, Smitty Cords PCP: dr Kenyon Ana [facility] [address] Supervising MD: Eden Emms MD, Theron Arista Indication 1: Atrial Fibrillation Lab Used: [facility] [place] Site: [address] INR POC 2.0 INR RANGE 2.0-3.0  Dietary changes: no    Health status changes: no    Bleeding/hemorrhagic complications: no    Recent/future hospitalizations: no    Any changes in medication regimen? no    Recent/future dental: no  Any missed doses?: no       Is patient compliant with meds? yes       Allergies: 1)  ! Crestor 2)  Vytorin 3)  Atenolol  Anticoagulation Management History:      The patient is taking warfarin and comes in today for a routine follow up visit.  Positive risk factors for bleeding include an age of 73 years or older and presence of serious comorbidities.  The bleeding index is 'intermediate risk'.  Positive CHADS2 values include History of CHF, History of HTN, and Age > 10 years old.  His last INR was 3.4.  Anticoagulation responsible provider: Eden Emms MD, Theron Arista.  INR POC: 2.0.  Cuvette Lot#: [id number].  Exp: 05/2011.    Anticoagulation Management Assessment/Plan:      The patient's current anticoagulation dose is Warfarin sodium 1 mg tabs: Use as directed by Anticoagualtion Clinic. take 2mg  daily except monday and friday he takes 1mg .  The target INR is 2.0-3.0.  The next INR is due 04/12/2010.  Anticoagulation instructions were given to patient/daughter.  Results were reviewed/authorized by Weston Brass, PharmD.  He was notified by Weston Brass PharmD.         Prior Anticoagulation Instructions: INR 2.1  Continue same dose of 2 tablets every day except 1 tablet on Monday and Friday   Current Anticoagulation Instructions: INR 2.0  Continue same dose of 2 tablets every day except 1 tablet on Monday and Friday.

## 2010-10-24 NOTE — Medication Information (Signed)
Summary: rov/tm  Anticoagulant Therapy  Managed by: Cloyde Reams, RN, BSN Referring MD: Juanda Chance MD, Bruce PCP: dr Elvina [facility] [address] Supervising MD: Jens Som MD, Arlys John Indication 1: Atrial Fibrillation Lab Used: [facility] [place] Site: [address] INR POC 1.8 INR RANGE 2.0-3.0  Dietary changes: no    Health status changes: no    Bleeding/hemorrhagic complications: no    Recent/future hospitalizations: no    Any changes in medication regimen? no    Recent/future dental: no  Any missed doses?: no       Is patient compliant with meds? yes       Allergies: 1)  ! Crestor 2)  Vytorin 3)  Atenolol  Anticoagulation Management History:      The patient is taking warfarin and comes in today for a routine follow up visit.  Positive risk factors for bleeding include an age of 75 years or older and presence of serious comorbidities.  The bleeding index is 'intermediate risk'.  Positive CHADS2 values include History of CHF, History of HTN, and Age > 2 years old.  His last INR was 3.4.  Anticoagulation responsible provider: Jens Som MD, Arlys John.  INR POC: 1.8.  Cuvette Lot#: [id number].  Exp: 06/2011.    Anticoagulation Management Assessment/Plan:      The patient's current anticoagulation dose is Warfarin sodium 1 mg tabs: Use as directed by Anticoagualtion Clinic. take 2mg  daily except monday and friday he takes 1mg .  The target INR is 2.0-3.0.  The next INR is due 05/09/2010.  Anticoagulation instructions were given to patient/daughter.  Results were reviewed/authorized by Cloyde Reams, RN, BSN.  He was notified by Cloyde Reams RN.         Prior Anticoagulation Instructions: INR 1.6 Today take 2mg s then resume 2mg s everyday except 1mg s on Mondays and Fridays. Recheck in 2 weeks.   Current Anticoagulation Instructions: INR 1.8  Take 2 tablets today, then start taking 2 tablets daily except 1 tablet on Fridays.  Recheck in 2 weeks.     Appended Document: rov/tm    Prescriptions: WARFARIN SODIUM 1 MG TABS (WARFARIN SODIUM) Use as directed by Anticoagualtion Clinic. take 2mg  daily except monday and friday he takes 1mg   #60 x 3   Entered by:   Cloyde Reams RN   Authorized by:   Ferman Hamming, MD, [facility]   Signed by:   Cloyde Reams RN on 04/21/2010   Method used:   Electronically to        [facility] [address] [phone]* (retail)       [address]       [place], [place]  [address]       Ph: [phone] or [phone]       Fax: [phone]   RxID:   [id number]

## 2010-10-24 NOTE — Medication Information (Signed)
Summary: rov/tm  Anticoagulant Therapy  Managed by: Cloyde Reams, RN, BSN Referring MD: Juanda Chance MD, Bruce PCP: dr Kenyon Ana [facility] [address] Supervising MD: Johney Frame MD, Fayrene Fearing Indication 1: Atrial Fibrillation Lab Used: [facility] [place] Site: [address] INR POC 2.5 INR RANGE 2.0-3.0  Dietary changes: no    Health status changes: no    Bleeding/hemorrhagic complications: no    Recent/future hospitalizations: no    Any changes in medication regimen? no    Recent/future dental: no  Any missed doses?: no       Is patient compliant with meds? yes       Allergies: 1)  ! Crestor 2)  Vytorin 3)  Atenolol  Anticoagulation Management History:      The patient is taking warfarin and comes in today for a routine follow up visit.  Positive risk factors for bleeding include an age of 75 years or older and presence of serious comorbidities.  The bleeding index is 'intermediate risk'.  Positive CHADS2 values include History of CHF, History of HTN, and Age > 37 years old.  His last INR was 3.4.  Anticoagulation responsible provider: Lukus Binion MD, Fayrene Fearing.  INR POC: 2.5.  Cuvette Lot#: [id number].  Exp: 12/2010.    Anticoagulation Management Assessment/Plan:      The patient's current anticoagulation dose is Warfarin sodium 1 mg tabs: Use as directed by Anticoagualtion Clinic. take 2mg  daily except monday and friday he takes 1mg .  The target INR is 2.0-3.0.  The next INR is due 12/08/2009.  Anticoagulation instructions were given to patient/daughter.  Results were reviewed/authorized by Cloyde Reams, RN, BSN.  He was notified by Cloyde Reams RN.         Prior Anticoagulation Instructions: INR 2.0 Today 3mg s then resume 2mg s daily except 1mg s on Mondays and Fridays. Recheck in 2 weeks.   Current Anticoagulation Instructions: INR 2.5  Continue on same dosage 2 tablets daily except 1 tablet on Mondays and Fridays. Recheck in 4 weeks.

## 2010-10-26 NOTE — Medication Information (Signed)
Summary: rov/tm  Anticoagulant Therapy  Managed by: Bethena Midget, RN, BSN Referring MD: Juanda Chance MD, Bruce PCP: dr Elvina [facility] [address] Supervising MD: Riley Kill MD, Maisie Fus Indication 1: Atrial Fibrillation Lab Used: [facility] [place] Site: [address] INR POC 2.5 INR RANGE 2.0-3.0  Dietary changes: no    Health status changes: no    Bleeding/hemorrhagic complications: no    Recent/future hospitalizations: no    Any changes in medication regimen? no    Recent/future dental: no  Any missed doses?: no       Is patient compliant with meds? yes       Allergies: 1)  ! Crestor 2)  Vytorin 3)  Atenolol  Anticoagulation Management History:      The patient is taking warfarin and comes in today for a routine follow up visit.  Positive risk factors for bleeding include an age of 75 years or older and presence of serious comorbidities.  The bleeding index is 'intermediate risk'.  Positive CHADS2 values include History of CHF, History of HTN, and Age > 42 years old.  His last INR was 3.4.  Anticoagulation responsible provider: Riley Kill MD, Maisie Fus.  INR POC: 2.5.  Cuvette Lot#: [id number].  Exp: 10/2011.    Anticoagulation Management Assessment/Plan:      The patient's current anticoagulation dose is Warfarin sodium 1 mg tabs: Use as directed by Anticoagualtion Clinic.  The target INR is 2.0-3.0.  The next INR is due 10/18/2010.  Anticoagulation instructions were given to patient/daughter.  Results were reviewed/authorized by Bethena Midget, RN, BSN.  He was notified by Bethena Midget, RN, BSN.         Prior Anticoagulation Instructions: INR 2.4 Continue 2 pills everyday except 1 pill on Fridays. Recheck in 4 weeks.   Current Anticoagulation Instructions: INR 2.5 Continue 2 pills everyday except 1 pill on Fridays. Recheck in 4 weeks.

## 2010-10-26 NOTE — Medication Information (Signed)
Summary: rov/tp  Anticoagulant Therapy  Managed by: Geoffry Paradise, PharmD Referring MD: Juanda Chance MD, Smitty Cords PCP: dr Kenyon [facility] [address] Supervising MD: Eden Emms MD, Theron Arista Indication 1: Atrial Fibrillation Lab Used: [facility] [place] Site: [address] INR POC 2.1 INR RANGE 2.0-3.0  Dietary changes: no    Health status changes: no    Bleeding/hemorrhagic complications: no    Recent/future hospitalizations: no    Any changes in medication regimen? no    Recent/future dental: no  Any missed doses?: no       Is patient compliant with meds? yes       Allergies: 1)  ! Crestor 2)  Vytorin 3)  Atenolol  Anticoagulation Management History:      The patient is taking warfarin and comes in today for a routine follow up visit.  Positive risk factors for bleeding include an age of 75 years or older and presence of serious comorbidities.  The bleeding index is 'intermediate risk'.  Positive CHADS2 values include History of CHF, History of HTN, and Age > 68 years old.  His last INR was 3.4.  Anticoagulation responsible provider: Eden Emms MD, Theron Arista.  INR POC: 2.1.  Cuvette Lot#: [id number].  Exp: 09/2011.    Anticoagulation Management Assessment/Plan:      The patient's current anticoagulation dose is Warfarin sodium 1 mg tabs: Use as directed by Anticoagualtion Clinic.  The target INR is 2.0-3.0.  The next INR is due 11/15/2010.  Anticoagulation instructions were given to patient/daughter.  Results were reviewed/authorized by Geoffry Paradise, PharmD.         Prior Anticoagulation Instructions: INR 2.5 Continue 2 pills everyday except 1 pill on Fridays. Recheck in 4 weeks.   Current Anticoagulation Instructions: INR:  (goal 2-3)  Your INR is at goal today.  Continue taking Coumadin 2 mg everyday except for 1 mg on Fridays.  Return in 4 weeks for another INR check.   Take as directed by Coumadin Clinic.

## 2010-11-14 DIAGNOSIS — I4891 Unspecified atrial fibrillation: Secondary | ICD-10-CM

## 2010-11-15 ENCOUNTER — Ambulatory Visit (INDEPENDENT_AMBULATORY_CARE_PROVIDER_SITE_OTHER): Payer: Medicare Other | Admitting: Cardiology

## 2010-11-15 ENCOUNTER — Encounter: Payer: Self-pay | Admitting: Cardiovascular Disease

## 2010-11-15 ENCOUNTER — Encounter (INDEPENDENT_AMBULATORY_CARE_PROVIDER_SITE_OTHER): Payer: Medicare Other

## 2010-11-15 ENCOUNTER — Other Ambulatory Visit: Payer: Self-pay | Admitting: Cardiology

## 2010-11-15 ENCOUNTER — Encounter: Payer: Self-pay | Admitting: Cardiology

## 2010-11-15 DIAGNOSIS — I2510 Atherosclerotic heart disease of native coronary artery without angina pectoris: Secondary | ICD-10-CM

## 2010-11-15 DIAGNOSIS — Z7901 Long term (current) use of anticoagulants: Secondary | ICD-10-CM

## 2010-11-15 DIAGNOSIS — I4891 Unspecified atrial fibrillation: Secondary | ICD-10-CM

## 2010-11-15 DIAGNOSIS — E785 Hyperlipidemia, unspecified: Secondary | ICD-10-CM

## 2010-11-15 DIAGNOSIS — I5023 Acute on chronic systolic (congestive) heart failure: Secondary | ICD-10-CM

## 2010-11-15 DIAGNOSIS — I5022 Chronic systolic (congestive) heart failure: Secondary | ICD-10-CM

## 2010-11-15 LAB — CBC WITH DIFFERENTIAL/PLATELET
Basophils Absolute: 0 10*3/uL (ref 0.0–0.1)
Eosinophils Absolute: 0 10*3/uL (ref 0.0–0.7)
Lymphocytes Relative: 26.1 % (ref 12.0–46.0)
Lymphs Abs: 1.9 10*3/uL (ref 0.7–4.0)
MCHC: 34.9 g/dL (ref 30.0–36.0)
Monocytes Relative: 8.9 % (ref 3.0–12.0)
Platelets: 158 10*3/uL (ref 150.0–400.0)
RDW: 15.1 % — ABNORMAL HIGH (ref 11.5–14.6)

## 2010-11-15 LAB — BASIC METABOLIC PANEL
BUN: 21 mg/dL (ref 6–23)
Calcium: 8.9 mg/dL (ref 8.4–10.5)
GFR: 52.32 mL/min — ABNORMAL LOW (ref >60.00)
Glucose, Bld: 96 mg/dL (ref 70–99)

## 2010-11-15 LAB — LIPID PANEL
Cholesterol: 242 mg/dL — ABNORMAL HIGH (ref 0–200)
HDL: 50.6 mg/dL (ref >39.00)
VLDL: 22.6 mg/dL (ref 0.0–40.0)

## 2010-11-15 LAB — HEPATIC FUNCTION PANEL
ALT: 18 U/L (ref 0–53)
Albumin: 3.7 g/dL (ref 3.5–5.2)
Alkaline Phosphatase: 54 U/L (ref 39–117)
Total Protein: 7.1 g/dL (ref 6.0–8.3)

## 2010-11-15 LAB — LDL CHOLESTEROL, DIRECT: Direct LDL: 181.1 mg/dL

## 2010-11-15 LAB — CONVERTED CEMR LAB: POC INR: 2.3

## 2010-11-21 NOTE — Medication Information (Signed)
Summary: rov/tm  Anticoagulant Therapy  Managed by: Bethena Midget, RN, BSN Referring MD: Juanda Chance MD, Bruce PCP: dr Elvina [facility] [address] Supervising MD: Eden Emms MD, Theron Arista Indication 1: Atrial Fibrillation Lab Used: [facility] [place] Site: [address] INR POC 2.3 INR RANGE 2.0-3.0  Dietary changes: no    Health status changes: no    Bleeding/hemorrhagic complications: no    Recent/future hospitalizations: no    Any changes in medication regimen? no    Recent/future dental: no  Any missed doses?: no       Is patient compliant with meds? yes       Allergies: 1)  ! Crestor 2)  Vytorin 3)  Atenolol  Anticoagulation Management History:      The patient is taking warfarin and comes in today for a routine follow up visit.  Positive risk factors for bleeding include an age of 75 years or older and presence of serious comorbidities.  The bleeding index is 'intermediate risk'.  Positive CHADS2 values include History of CHF, History of HTN, and Age > 75 years old.  His last INR was 3.4.  Anticoagulation responsible provider: Eden Emms MD, Theron Arista.  INR POC: 2.3.  Cuvette Lot#: [id number].  Exp: 09/2011.    Anticoagulation Management Assessment/Plan:      The patient's current anticoagulation dose is Warfarin sodium 1 mg tabs: Use as directed by Anticoagualtion Clinic.  The target INR is 2.0-3.0.  The next INR is due 12/13/2010.  Anticoagulation instructions were given to patient/daughter.  Results were reviewed/authorized by Bethena Midget, RN, BSN.  He was notified by Bethena Midget, RN, BSN.         Prior Anticoagulation Instructions: INR:  (goal 2-3)  Your INR is at goal today.  Continue taking Coumadin 2 mg everyday except for 1 mg on Fridays.  Return in 4 weeks for another INR check.   Take as directed by Coumadin Clinic.  Current Anticoagulation Instructions: INR 2.3 Continue 2 pills everyday except 1 pill on Fridays. Recheck in 4 weeks.

## 2010-11-21 NOTE — Assessment & Plan Note (Signed)
Summary: EC6/3 MONTH ROV.FORMER PATIENT OF DR. Lanny Cramp DAUGHTER WILL...   Primary Provider:  Dr Milus Glazier  CC:  former [facility] patient.  History of Present Illness: 77 yo with history of CAD, ischemic CMP, CKD, and paroxysmal atrial fibrillation presents for cardiology followup.  He speaks little [place] and his daughter interprets.  He has been doing well symptomatically.  He has felt no sustained atrial fibrillation.  He will occasionally feel some mild fluttering in his chest.  He is in sinus rhythm today.  He can walk on flat ground without any dyspnea.   He and his wife walk twice a day for about 40 minutes.  No chest pain.  He is not on a statin due to myalgias in the past on Crestor and Lipitor.    ECG: NSR, mildly prolonged QTc  Labs (9/11): K 4.1, creatinine 1.4  Current Medications (verified): 1)  Levothroid 100 Mcg Tabs (Levothyroxine Sodium) .Marland Kitchen.. 1 Qd 2)  Flomax 0.4 Mg Cp24 (Tamsulosin Hcl) .Marland Kitchen.. 1 Qd 3)  Furosemide 40 Mg Tabs (Furosemide) .... Take One Tablet By Mouth Daily. 4)  Zolpidem Tartrate 10 Mg  Tabs (Zolpidem Tartrate) .... 1/2 or 1 By Mouth At [facility] Prn 5)  Warfarin Sodium 1 Mg Tabs (Warfarin Sodium) .... Use As Directed By Anticoagualtion Clinic 6)  Metoprolol Succinate 25 Mg Xr24h-Tab (Metoprolol Succinate) .... Take One Tablet By Mouth Daily 7)  Klor-Con 20 Meq Pack (Potassium Chloride) .Marland Kitchen.. 1 Tab Once Daily 8)  Amiodarone Hcl 200 Mg Tabs (Amiodarone Hcl) .... Take 1/2  Tablet By Mouth Daily 9)  Fish Oil 1200 Mg Caps (Omega-3 Fatty Acids) .... Take 1 Capsule By Mouth Two Times A Day 10)  Coq10 100 Mg Caps (Coenzyme Q10) .... Take 1 Capsule By Mouth Once A Day 11)  Senokot S 8.6-50 Mg Tabs (Sennosides-Docusate Sodium) .... Take 1 Tablet By Mouth Once A Day 12)  Centrum Silver  Tabs (Multiple Vitamins-Minerals) .... Take 1 Tablet Daily  Allergies (verified): 1)  ! Crestor 2)  Vytorin 3)  Atenolol  Past History:  Past Medical History: 1. Coronary artery  disease: LHC 4/06 with 60% pLAD, 40% ramus, 60% mRCA, and 90% mPDA.  No intervention.  Inferior akinesis on LV-gram was suggestive of possible inferior MI with recanalization.  2. Hyperlipidemia: Myalgias with Lipitor and Crestor.  3. Hypothyroidism 4. Peripheral vascular disease 5. Anxiety 6. Skin cancer, hx of 2008 BCC 7. Colonic polyps, hx of 8. Paroxysmal atrial fibrillation with rapid ventricular response treated with amiodarone therapy converting to normal sinus rhythm 02/2009 9. Systolic CHF: Probable ischemic cardiomyopathy.  Echo (6/10) with EF 40%, mildly dilated LV, moderate MR.  10. History of PNA 11. CKD. Baseline creatinine around 1.4.  12. Mitral regurgitation: Moderate by echo 6/10.  2-3+ by cath 2006.  13. PVCs  Family History: Reviewed history from 07/14/2007 and no changes required. Family History of CAD Male 1st degree relative <60  Social History: Retired Originally from [place], speaks little English Married Former Smoker  Review of Systems       All systems reviewed and negative except as per HPI.   Vital Signs:  Patient profile:   77 year old male Height:      66 inches Weight:      137 pounds BMI:     22.19 Pulse rate:   67 / minute Pulse rhythm:   regular BP sitting:   116 / 64  (left arm) Cuff size:   regular  Vitals Entered By: Judithe Modest  CMA (November 15, 2010 8:47 AM)  Physical Exam  General:  Thin, no distress Neck:  Neck supple, no JVD. No masses, thyromegaly or abnormal cervical nodes. Lungs:  Clear bilaterally to auscultation and percussion. Heart:  Non-displaced PMI, chest non-tender; regular rate and rhythm, S1, S2 without rubs or gallops. 1/6 HSM at apex.  Carotid upstroke normal, no bruit. Pedals normal pulses. No edema, no varicosities. Abdomen:  Bowel sounds positive; abdomen soft and non-tender without masses, organomegaly, or hernias noted. No hepatosplenomegaly. Extremities:  No clubbing or cyanosis. Neurologic:  Alert  and oriented x 3. Psych:  Normal affect.   Impression & Recommendations:  Problem # 1:  CORONARY ATHEROSCLEROSIS NATIVE CORONARY ARTERY (ICD-414.01) No ischemic symptoms.  ECG without ischemic changes.  He is on warfarin and Toprol XL.  I will have him start pravastatin (has had myalgias in the past with Crestor and Lipitor).    Problem # 2:  ATRIAL FIBRILLATION (ICD-427.31) Patient is maintaining sinus rhythm on amiodarone.  He is on warfarin.  I will check LFTs and TSH.   Problem # 3:  MITRAL INSUFFICIENCY (ICD-396.3) Minimal murmur.  Will get echocardiogram to followup on MR and also reassess LV systolic function.   Problem # 4:  SYSTOLIC HEART FAILURE, CHRONIC (ICD-428.22) Ischemic CMP.  Last EF 40%.  Repeat echo.  If EF is still low, will want to carefully add ACEI.   Problem # 5:  HYPERLIPIDEMIA (ICD-272.4) Goal LDL < 70.  Starting pravastatin 40 mg daily.  Will monitor for myalgias.   Other Orders: Echocardiogram (Echo) TLB-BMP (Basic Metabolic Panel-BMET) (80048-METABOL) TLB-CBC Platelet - w/Differential (85025-CBCD) TLB-Lipid Panel (80061-LIPID) TLB-Hepatic/Liver Function Pnl (80076-HEPATIC)  Patient Instructions: 1)  Your physician recommends that you have lab today--lipid/liver/BMP/CBC 427.31   428.22 2)  Your physician has requested that you have an echocardiogram.  Echocardiography is a painless test that uses sound waves to create images of your heart. It provides your doctor with information about the size and shape of your heart and how well your heart's chambers and valves are working.  This procedure takes approximately one hour. There are no restrictions for this procedure. 3)  Your physician recommends that you schedule a follow-up appointment in: 3 months with Dr Shirlee Latch. Prescriptions: METOPROLOL SUCCINATE 25 MG XR24H-TAB (METOPROLOL SUCCINATE) Take one tablet by mouth daily  #30 x 6   Entered by:   Katina Dung, RN, BSN   Authorized by:   Marca Ancona, MD    Signed by:   Katina Dung, RN, BSN on 11/15/2010   Method used:   Electronically to        [facility] [address] [phone]* (retail)       [address]       [place], [place]  [address]       Ph: [phone] or [phone]       Fax: [phone]   RxID:   [id number] AMIODARONE HCL 200 MG TABS (AMIODARONE HCL) Take 1/2  tablet by mouth daily  #15 x 6   Entered by:   Katina Dung, RN, BSN   Authorized by:   Marca Ancona, MD   Signed by:   Katina Dung, RN, BSN on 11/15/2010   Method used:   [facility]        [facility] [address] [phone]* (retail)       [address]       [place]  [place], [place]  [address]       Ph: [phone] or [phone]       Fax: [phone]   RxID:   [id number]

## 2010-11-23 ENCOUNTER — Ambulatory Visit (HOSPITAL_COMMUNITY): Payer: Medicare Other | Attending: Cardiology

## 2010-11-23 DIAGNOSIS — I379 Nonrheumatic pulmonary valve disorder, unspecified: Secondary | ICD-10-CM | POA: Insufficient documentation

## 2010-11-23 DIAGNOSIS — I079 Rheumatic tricuspid valve disease, unspecified: Secondary | ICD-10-CM | POA: Insufficient documentation

## 2010-11-23 DIAGNOSIS — I2510 Atherosclerotic heart disease of native coronary artery without angina pectoris: Secondary | ICD-10-CM | POA: Insufficient documentation

## 2010-11-23 DIAGNOSIS — I080 Rheumatic disorders of both mitral and aortic valves: Secondary | ICD-10-CM | POA: Insufficient documentation

## 2010-11-23 DIAGNOSIS — I4891 Unspecified atrial fibrillation: Secondary | ICD-10-CM | POA: Insufficient documentation

## 2010-11-23 DIAGNOSIS — E785 Hyperlipidemia, unspecified: Secondary | ICD-10-CM | POA: Insufficient documentation

## 2010-11-23 DIAGNOSIS — I059 Rheumatic mitral valve disease, unspecified: Secondary | ICD-10-CM

## 2010-11-23 DIAGNOSIS — I319 Disease of pericardium, unspecified: Secondary | ICD-10-CM | POA: Insufficient documentation

## 2010-12-13 ENCOUNTER — Ambulatory Visit (INDEPENDENT_AMBULATORY_CARE_PROVIDER_SITE_OTHER): Payer: Medicare Other | Admitting: *Deleted

## 2010-12-13 DIAGNOSIS — I4891 Unspecified atrial fibrillation: Secondary | ICD-10-CM

## 2010-12-13 LAB — POCT INR: INR: 2.4

## 2010-12-13 MED ORDER — WARFARIN SODIUM 1 MG PO TABS: 1.0000 mg | ORAL_TABLET | ORAL | Status: DC

## 2010-12-13 NOTE — Patient Instructions (Signed)
Continue same dose of 2mg  every day except 1mg  on Friday.  Recheck INR in 4 weeks.

## 2010-12-31 LAB — BASIC METABOLIC PANEL
CO2: 26 mEq/L (ref 19–32)
Chloride: 101 mEq/L (ref 96–112)
GFR calc Af Amer: 57 mL/min — ABNORMAL LOW (ref >60)
Potassium: 3.8 mEq/L (ref 3.5–5.1)
Sodium: 134 mEq/L — ABNORMAL LOW (ref 135–145)

## 2010-12-31 LAB — CBC
RBC: 3.81 MIL/uL — ABNORMAL LOW (ref 4.22–5.81)
WBC: 8 10*3/uL (ref 4.0–10.5)

## 2010-12-31 LAB — PROTIME-INR
INR: 2.6 — ABNORMAL HIGH (ref 0.00–1.49)
Prothrombin Time: 29.7 seconds — ABNORMAL HIGH (ref 11.6–15.2)

## 2011-01-01 LAB — CARDIAC PANEL(CRET KIN+CKTOT+MB+TROPI)
CK, MB: 3.8 ng/mL (ref 0.3–4.0)
Relative Index: 3.7 — ABNORMAL HIGH (ref 0.0–2.5)
Relative Index: INVALID (ref 0.0–2.5)
Total CK: 102 U/L (ref 7–232)
Total CK: 120 U/L (ref 7–232)
Total CK: 94 U/L (ref 7–232)
Troponin I: 0.05 ng/mL (ref 0.00–0.06)

## 2011-01-01 LAB — CBC
HCT: 38.5 % — ABNORMAL LOW (ref 39.0–52.0)
HCT: 41 % (ref 39.0–52.0)
Hemoglobin: 13 g/dL (ref 13.0–17.0)
Hemoglobin: 13.1 g/dL (ref 13.0–17.0)
Hemoglobin: 13.8 g/dL (ref 13.0–17.0)
Hemoglobin: 14.9 g/dL (ref 13.0–17.0)
MCHC: 34 g/dL (ref 30.0–36.0)
MCHC: 34.3 g/dL (ref 30.0–36.0)
MCV: 95.1 fL (ref 78.0–100.0)
MCV: 95.3 fL (ref 78.0–100.0)
MCV: 95.3 fL (ref 78.0–100.0)
Platelets: 141 10*3/uL — ABNORMAL LOW (ref 150–400)
RBC: 3.87 MIL/uL — ABNORMAL LOW (ref 4.22–5.81)
RBC: 3.96 MIL/uL — ABNORMAL LOW (ref 4.22–5.81)
RBC: 4.04 MIL/uL — ABNORMAL LOW (ref 4.22–5.81)
RDW: 13.7 % (ref 11.5–15.5)
RDW: 13.7 % (ref 11.5–15.5)
RDW: 13.8 % (ref 11.5–15.5)
WBC: 6.7 10*3/uL (ref 4.0–10.5)
WBC: 8.9 10*3/uL (ref 4.0–10.5)

## 2011-01-01 LAB — COMPREHENSIVE METABOLIC PANEL
Albumin: 3.3 g/dL — ABNORMAL LOW (ref 3.5–5.2)
BUN: 20 mg/dL (ref 6–23)
CO2: 25 mEq/L (ref 19–32)
Chloride: 107 mEq/L (ref 96–112)
Creatinine, Ser: 1.63 mg/dL — ABNORMAL HIGH (ref 0.4–1.5)
GFR calc non Af Amer: 41 mL/min — ABNORMAL LOW (ref >60)
Glucose, Bld: 188 mg/dL — ABNORMAL HIGH (ref 70–99)
Total Bilirubin: 0.8 mg/dL (ref 0.3–1.2)

## 2011-01-01 LAB — BASIC METABOLIC PANEL
BUN: 21 mg/dL (ref 6–23)
CO2: 23 mEq/L (ref 19–32)
Calcium: 8.2 mg/dL — ABNORMAL LOW (ref 8.4–10.5)
Calcium: 8.2 mg/dL — ABNORMAL LOW (ref 8.4–10.5)
Chloride: 102 mEq/L (ref 96–112)
Creatinine, Ser: 1.47 mg/dL (ref 0.4–1.5)
GFR calc Af Amer: 54 mL/min — ABNORMAL LOW (ref >60)
GFR calc non Af Amer: 46 mL/min — ABNORMAL LOW (ref >60)
Glucose, Bld: 146 mg/dL — ABNORMAL HIGH (ref 70–99)
Potassium: 3.6 mEq/L (ref 3.5–5.1)
Sodium: 133 mEq/L — ABNORMAL LOW (ref 135–145)
Sodium: 138 mEq/L (ref 135–145)
Sodium: 138 mEq/L (ref 135–145)

## 2011-01-01 LAB — MAGNESIUM: Magnesium: 2.4 mg/dL (ref 1.5–2.5)

## 2011-01-01 LAB — HEPARIN LEVEL (UNFRACTIONATED): Heparin Unfractionated: 0.78 IU/mL — ABNORMAL HIGH (ref 0.30–0.70)

## 2011-01-01 LAB — PROTIME-INR
INR: 1.6 — ABNORMAL HIGH (ref 0.00–1.49)
INR: 3.2 — ABNORMAL HIGH (ref 0.00–1.49)
Prothrombin Time: 19.8 seconds — ABNORMAL HIGH (ref 11.6–15.2)
Prothrombin Time: 29.6 seconds — ABNORMAL HIGH (ref 11.6–15.2)
Prothrombin Time: 35.5 seconds — ABNORMAL HIGH (ref 11.6–15.2)
Prothrombin Time: 40.1 seconds — ABNORMAL HIGH (ref 11.6–15.2)

## 2011-01-01 LAB — DIFFERENTIAL
Basophils Absolute: 0 10*3/uL (ref 0.0–0.1)
Lymphocytes Relative: 24 % (ref 12–46)
Neutro Abs: 4.4 10*3/uL (ref 1.7–7.7)
Neutrophils Relative %: 66 % (ref 43–77)

## 2011-01-01 LAB — TSH: TSH: 1.175 u[IU]/mL (ref 0.350–4.500)

## 2011-01-10 ENCOUNTER — Encounter: Payer: Medicare Other | Admitting: *Deleted

## 2011-01-12 ENCOUNTER — Other Ambulatory Visit (INDEPENDENT_AMBULATORY_CARE_PROVIDER_SITE_OTHER): Payer: Medicare Other | Admitting: *Deleted

## 2011-01-12 ENCOUNTER — Encounter: Payer: Medicare Other | Admitting: *Deleted

## 2011-01-12 ENCOUNTER — Ambulatory Visit (INDEPENDENT_AMBULATORY_CARE_PROVIDER_SITE_OTHER): Payer: Medicare Other | Admitting: *Deleted

## 2011-01-12 DIAGNOSIS — I2510 Atherosclerotic heart disease of native coronary artery without angina pectoris: Secondary | ICD-10-CM

## 2011-01-12 DIAGNOSIS — E785 Hyperlipidemia, unspecified: Secondary | ICD-10-CM

## 2011-01-12 DIAGNOSIS — I4891 Unspecified atrial fibrillation: Secondary | ICD-10-CM

## 2011-01-12 LAB — LIPID PANEL
HDL: 53.5 mg/dL (ref >39.00)
Triglycerides: 105 mg/dL (ref 0.0–149.0)
VLDL: 21 mg/dL (ref 0.0–40.0)

## 2011-01-12 LAB — HEPATIC FUNCTION PANEL
Albumin: 3.6 g/dL (ref 3.5–5.2)
Alkaline Phosphatase: 41 U/L (ref 39–117)
Bilirubin, Direct: 0.1 mg/dL (ref 0.0–0.3)

## 2011-01-12 LAB — POCT INR: INR: 2.1

## 2011-01-16 ENCOUNTER — Other Ambulatory Visit: Payer: Self-pay | Admitting: *Deleted

## 2011-01-16 MED ORDER — PRAVASTATIN SODIUM 80 MG PO TABS: 80.0000 mg | ORAL_TABLET | Freq: Every evening | ORAL | Status: DC

## 2011-01-25 ENCOUNTER — Other Ambulatory Visit: Payer: Self-pay | Admitting: *Deleted

## 2011-01-25 DIAGNOSIS — I4891 Unspecified atrial fibrillation: Secondary | ICD-10-CM

## 2011-01-25 MED ORDER — WARFARIN SODIUM 1 MG PO TABS: 1.0000 mg | ORAL_TABLET | ORAL | Status: DC

## 2011-02-06 NOTE — Discharge Summary (Signed)
[name], HARROWER NO.:  [id number]   MEDICAL RECORD NO.:  [id number]          PATIENT TYPE:  INP   LOCATION:  2027                         FACILITY:  [facility]   PHYSICIAN:  Bruce R. Juanda Chance, MD, FACCDATE OF BIRTH:  01/04/1926   DATE OF ADMISSION:  03/19/2009  DATE OF DISCHARGE:  03/24/2009                               DISCHARGE SUMMARY   PRIMARY CARDIOLOGIST:  Everardo Beals. Juanda Chance, MD, [facility]   DISCHARGING DIAGNOSES:  1. Atrial fibrillation with rapid ventricular response treated with      amiodarone therapy converting to normal sinus rhythm, also a beta-      blocker therapy initiated.  The patient anticoagulated with an INR      of 2.6 at the time of discharge.  2. Congestive heart failure secondary to ischemic cardiomyopathy      status post 2-D echocardiogram this admission with the ejection      fraction estimated at 40%.  3. Pneumonia treated with Levaquin and BiPAP secondary to hypoxia.  4. Chronic renal insufficiency.  Creatinine at time of discharge 1.44      with a BUN of 22.  5. Deconditioning.  The patient underwent cardiac rehab assistance and      a nutritional consult assistance during this hospitalization and is      being discharged home with advanced home care assistance.  6. Severe mitral regurgitation grade 3/4, confirmed by cardiac      catheterization and 2-D echocardiogram obtained this admission.      The patient is not interested in pursuing further surgical      treatment of his mitral regurgitation.  7. Coronary artery disease, status post cardiac catheterization April      2006, EF of 30-40% at that time with 2 to 3+ mitral regurgitation      and otherwise mostly nonobstructive coronary artery disease with a      90% stenosis in the distal PDA.  Past medical history includes      hypertension, hyperlipidemia, hypothyroidism, chronic renal      insufficiency, asymptomatic PVCs.   HOSPITAL COURSE:  The patient is an 77 year old gentleman  originally  from [place] who does not speak [place].  Family is here to assist with  communication.  He presented this admission emergency room for rapid  heart rate and low blood pressure.  Previous medical history as  documented above.  The patient was found to be hypotensive with a blood  pressure 88 by palpation, atrial fib at a rate of 130s, mildly elevated  JVP.  Lungs were clear on initial exam.  The patient was seen by Dr.  Eden Emms.  Blood pressure medications were held.  The patient was treated  with heparin, and Coumadin therapy was initiated.  He was gently  hydrated for hypotension and ultimately was able to receive Lopressor.  He was admitted to CCU, continue to remain hypotensive, gently hydrated  once again, and amiodarone was initiated.  With improvement in symptoms  and the patient converted to normal sinus rhythm, he was gently diuresed  the following day and continued to remain stable and then relapsed  with  increased dyspnea.  Chest x-ray concerning for pneumonia.  The patient  was treated with Levaquin and placed on BiPAP briefly.  Amiodarone drip  was converted to p.o.  The patient was stable, transferred to telemetry  floor.  The patient was seen by Dr. Juanda Chance on the day of discharge,  continues to remain in sinus rhythm.  Chest x-ray much improved.  No JVD  noted.  Blood pressure 123/70 with a heart rate of 74, afebrile, stable  to be discharged home on new medical regimen as follows.  His torsemide,  pindolol, and enalapril have been discontinued.  We will consider  resuming ACE inhibitor outpatient if renal function remained stable  (creatinine 1.44 at the time of discharge).  Prescription provided for  furosemide 40 mg daily, amiodarone 400 mg daily, potassium 20 mEq daily,  Toprol-XL 25 mg daily.  These are new medications.  Coumadin, we will  continue 1 mg daily.  I have asked [facility] to draw PT/INR  on Monday and fax the results to Dr. Shelby Dubin  in the Coumadin Clinic  in our office.  They have the fax number to reach her, and  then I have scheduled his first new Coumadin Clinic visit for July 9 at  9:30.  He will follow up with Dr. Juanda Chance on July 19 at 9:15.   DURATION OF DISCHARGE ENCOUNTER:  Well over 30 minutes.      Dorian Pod, ACNP      Bruce R. Juanda Chance, MD, [facility]  Electronically Signed    MB/MEDQ  D:  03/24/2009  T:  03/25/2009  Job:  [id number]   cc:   Dr. Dorothey Baseman

## 2011-02-06 NOTE — Consult Note (Signed)
[name], [name] NO.:  [id number]   MEDICAL RECORD NO.:  [id number]          PATIENT TYPE:  EMS   LOCATION:  MAJO                         FACILITY:  [facility]   PHYSICIAN:  Noralyn Pick. Eden Emms, MD, FACCDATE OF BIRTH:  March 19, 1926   DATE OF CONSULTATION:  03/19/2009  DATE OF DISCHARGE:                                 CONSULTATION   An 77 year old patient we were asked to see in the emergency room for  rapid heart rate and low blood pressure.   The patient has previously been seen by Dr. Juanda Chance and Dr. Andee Lineman.  He  does not have a history of critical coronary disease, but has a history  of decreased LV function.  His last echocardiogram that I have on record  here from 2007 showed an EF of 25-35% with grade 3/4 MR.  Apparently,  there were some thoughts about doing a catheterization, but the patient  and daughter wanted conservative therapy.  He did have a heart  catheterization back in 2006, which confirmed angiographic grade 3/4 MR  with moderately decreased LV function and occlusion of the mid RCA.   The patient would appear then to have a chronic ischemic cardiomyopathy  with ischemic MR.  He has not been hospitalized.  He was watching a  [place] TV show and apparently there was some attention brought to his  pulse.  It was elevated.  He went to see a friend of his, Dr. Armandina Stammer and indeed he had a rapid pulse rate of about 120.  The  patient is on some blood pressure medicines and Flomax for his prostate.  His blood pressure appeared to be low.  The patient claims to be  asymptomatic at this point.  He did not have any significant chest pain,  PND, or orthopnea.  There is no lower extremity edema or syncope.  There  has been no febrile illness.  There has been no focal signs of  infection; however, in the emergency room, he was in rapid atrial  fibrillation at a rate of 130 with a systolic blood pressure of 88.   His review of systems is otherwise  negative.  His past medical history  is remarkable for coronary disease with a previous inferior wall MI.  History of prostatitis, history of hypertension, history of PVCs,  history of mitral regurgitation, and history of hyperlipidemia.   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  He lives here with his wife.  His daughter is nearby and is translating for me.  He does not drink or  smoke.  He walks two times a day and is functional class I despite his  previous history.  He does not smoke or drink.   Family history is noncontributory.   I do not have an active med list from home.  His meds when he last saw  Dr. Juanda Chance were pindolol 2.5 b.i.d., Flomax 0.4 a day, an aspirin a day,  Protonix 40 a day, Synthroid 100 mcg a day, Cozaar 25 b.i.d., Niaspan  500 daily, Lasix 20 a day and Ambien.   He denies any allergies.  Skin is remarkable for healthy-appearing  [place] male with no signs of shock.  Blood pressure is 88/palp.  He is  in AFib at a rate of 130.  HEENT unremarkable.  JVP is mildly elevated  and no lymphadenopathy, thyromegaly, or bruit.  Lungs are clear.  Good  diaphragmatic motion.  No wheezing.  There is an S1 and S2 with an MR  murmur.  PMI is increased.  ABDOMEN is benign.  Bowel sounds positive.  No AAA.  No tenderness.  No bruit.  No hepatosplenomegaly, hepatojugular  reflux, or tenderness.  Distal pulses are intact.  No edema.  Neuro  nonfocal.  Skin warm and dry.  No muscle weakness.   EKG shows atrial fibrillation with nonspecific ST-T wave changes and no  acute ischemic changes.  His initial lab work is remarkable for BNP of  626, potassium 4.3, creatinine 1.6.  White count 6.7, crit 41.   His chest x-ray has not been done yet.   IMPRESSION:  1. Rapid atrial fibrillation in the setting of ischemic cardiomyopathy      and decreased left ventricular function.  The patient would benefit      from transesophageal echocardiography-guided cardioversion,       particularly in light of his low blood pressure.  We will start him      on heparin.  I will give him a dose of Coumadin tonight.  We will      try to rate control him initially with oral beta-blockers given his      history of coronary disease.  We may be limited in our ability to      control his rate due to hypotension.  I spoke at length to the      daughter regarding the risks and benefits of a transesophageal      echocardiography-guided cardioversion.  They are willing to      proceed.  One has to use an interpreter or have the daughter      present when talking to the patient.  2. Ischemic myopathy with mitral regurgitation.  I would like to give      the patient a little bit of fluid to help with his blood pressure      at this time.  We will try not to volume overload him since he does      have a history of severe MR and decreased LV function and his BNP      is mildly elevated.  Depending on how he does today, we may place      him back on low dose of Lasix.  He does not appear to be interested      in aggressive therapy for his mitral regurgitation, although      physically looking at him, he would be a candidate for bypass and      mitral valve replacement.  3. Hypothyroidism.  Continue with Synthroid.  Check TSH and T4 to make      sure they are not contributing to his propensity for atrial      fibrillation.  4. History of hypertension, currently hypotensive, hold blood pressure      medicines.  5. Prostatitis.  The patient not having urinary tract symptoms.  Check      PSA.  Hold Flomax or at least decrease the dose to 0.2 mg in light      of his low blood pressure.      Noralyn Pick. Eden Emms, MD, [facility]  Electronically Signed     PCN/MEDQ  D:  03/19/2009  T:  03/19/2009  Job:  [id number]

## 2011-02-07 ENCOUNTER — Ambulatory Visit (INDEPENDENT_AMBULATORY_CARE_PROVIDER_SITE_OTHER): Payer: Medicare Other | Admitting: *Deleted

## 2011-02-07 DIAGNOSIS — I4891 Unspecified atrial fibrillation: Secondary | ICD-10-CM

## 2011-02-07 LAB — POCT INR: INR: 3.1

## 2011-02-07 MED ORDER — WARFARIN SODIUM 1 MG PO TABS: 1.0000 mg | ORAL_TABLET | ORAL | Status: DC

## 2011-02-08 ENCOUNTER — Encounter: Payer: Medicare Other | Admitting: *Deleted

## 2011-02-09 NOTE — Assessment & Plan Note (Signed)
**[name] De-Identified via Obfuscation** [facility]                              CARDIOLOGY OFFICE [name]   Nathan [name], Nathan [name]                     MRN:          [id number]  DATE:08/20/2006                            DOB:          03/19/1926    CLINICAL HISTORY:  Nathan [name] is 77 years old and moved here from  [place] with his family nine years ago.  He speaks [place] but not [place],  and his daughter is here with him today to translate.  He has previously  been a patient of Nathan [name] and comes in today because of increased  symptoms of shortness of breath.   He was evaluated in April, 2006 with a catheterization, which showed a large  area of inferior akinesis with an ejection fraction of 35-40% and 2-3+  mitral regurgitation.  He had a 90% in the distal posterior descending  branch and a 60% lesion in the mid right coronary artery, which may have  been previously totally occluded.  He had nonobstructive disease in the left  coronary system.  He did fairly well on medical therapy after that time.  He  was treated with Diovan but subsequently stopped due to cramps.   Over the last several weeks, he developed increased symptoms of shortness of  breath.  He saw Dr. Faustino [name], who is a good friend of the family, in the  urgent care situation.  He did an x-ray and told him he had fluid and  congestive heart failure and treated him with Lasix 20 mg a day.  He greatly  improved with this and is now feeling back more towards normal.  His  daughter indicates that he did not follow salt restriction before, but he  has since he saw Dr. Faustino [name].  He has had no chest pain.   PAST MEDICAL HISTORY:  Significant for hyperlipidemia, hypertension, and  hypothyroidism.   CURRENT MEDICATIONS:  Pindolol, Flomax, aspirin, fish oil, Protonix,  Synthroid, Ambien, furosemide.  He was on Lovastatin, but this was stopped  due to abnormal liver function tests by Nathan [name].   PHYSICAL  EXAMINATION:  VITAL SIGNS:  Blood pressure 114/60, pulse 66 and  regular.  NECK:  There is no venous distention.  Carotid pulses were full without  bruits.  CHEST:  Clear.  CARDIAC:  Rhythm was regular.  There was a grade 2/6 mid to late systolic  murmur at the apex.  I could hear no diastolic murmur, no gallop.  ABDOMEN:  Soft without organomegaly.  EXTREMITIES:  Peripheral pulses were full.  There was no peripheral edema.   Electrocardiogram showed sinus rhythm with minor nonspecific ST-T change and  very small inferior Q waves.   IMPRESSION:  1. Coronary artery disease, status post probable remote out-of-hospital      diaphragmatic wall infarction while in [place] with mostly      nonobstructive disease at catheterization in 2006 and an ejection      fraction of 35-40% with 2-3+ mitral regurgitation.  2. Recent congestive heart failure, now improved with the addition of      Lasix and appears to be  euvolemic.  3. Hypertension.  4. Hyperlipidemia, off cholesterol medicines due to elevated liver      function tests.  5. Treated hypothyroidism.   RECOMMENDATIONS:  I think Nathan [name] is doing much better, and his  heart failure appears to be compensated at present.  I think it is important  that he be on an ACE or an ARB in place of the Diovan that he discontinued  due to cramps, and we will start Cozaar 25 mg twice daily.  We will get a  BNP today to check his response to furosemide and get another one in a week  after starting the Diovan.  We will get an echocardiogram in the next 2-3  weeks, and I will see him back in followup in four weeks to follow up on  these tests and medicine changes.  I will not try to initiate a statin now  in view of his problems with liver function tests in the past and  intolerance to multiple medications.  I will not plan to do another stress  test unless he has some recurrent symptoms since his cath was a year and a  half ago and since he had no  associated chest pain.  He and his family were  also exposed to radiation in [place] and are very sensitive to radiation  exposure.     Nathan Elvera Lennox Juanda Chance, MD, [facility]  Electronically Signed    BRB/MedQ  DD: 08/20/2006  DT: 08/20/2006  Job #: [id number]   cc:   Noralyn Pick. Eden Emms, MD, [facility]  Georgina Quint. Plotnikov, MD  Elvina Sidle, M.D.  Ernestina Penna, M.D.

## 2011-02-09 NOTE — Cardiovascular Report (Signed)
[name], [name] NO.:  [id number]   MEDICAL RECORD NO.:  [id number]          PATIENT TYPE:  OIB   LOCATION:  6501                         FACILITY:  [facility]   PHYSICIAN:  Arvilla Meres, M.D. LHCDATE OF BIRTH:  22-May-1926   DATE OF PROCEDURE:  01/02/2005  DATE OF DISCHARGE:                              CARDIAC CATHETERIZATION   PRIMARY CARE PHYSICIAN:  Georgina Quint. Plotnikov, M.D.   CARDIOLOGIST:  Learta Codding, M.D.   PATIENT IDENTIFICATION:  [name] is a very pleasant 77 year old male  from [place] with a history of CAD, status post previous IMI that was  reportedly untreated, which is now complicated by an ischemic cardiomyopathy  and some previous heart failure symptoms.  He is referred for cardiac  catheterization.  Of note, [name] is non-English speaking and his  son-in-law was present in the room during the entire procedure, translating  and observing.   PROCEDURES PERFORMED:  1.  Selective coronary angiography.  2.  Left heart catheterization.  3.  Left ventriculogram.  4.  Abdominal aortogram.   DESCRIPTION OF PROCEDURE:  The risks and benefits of the procedure were  explained to Mr. [name] and his son-in-law.  Consent was signed and  placed on the chart.  The right groin area was prepped and draped in routine  sterile fashion.  It was then anesthetized with 1% local lidocaine.  A 4  French arterial sheath was placed in the right femoral artery using the  modified Seldinger technique.  There was a significant amount of trouble  cannulating the left main given its upward angulation and the width of the  ascending aorta.  After numerous attempts, we were finally able to cannulate  with standard JL4 after adding a slight bend in the catheter by pressing it  against the cusp.  From that point, standard catheters were used for  angiography, including a preformed FL4, JR4 and angled pigtail.  All  catheter exchanges were made  over a wire.  There were no apparent  complications.  At the end of the procedure, the patient was taken to the  holding area in stable condition for removal of his arterial access.   FINDINGS:  The central aortic pressure was 104/57 with a mean of 77.  LV  pressure was 98/5 with an EDP of 7.  There was no gradient on aortic valve  pullback.   CORONARY ANATOMY:  The left main had a minor irregularity.   The LAD was a long vessel which bifurcated in the distal segment.  It also  gave off two small diagonal branches.  There was a 40% lesion proximally  followed by a 60% lesion just before the takeoff of the first diagonal.  This was slightly hypodense.  However, it was viewed at many angles and did  not appear to be worse than 60% at any point and there was no evidence of  flow limitation.  There was also a 30% in the mid section of the LAD.   Left circumflex:  This was a small system.  There was a long, but narrow  caliber ramus with  a 40% proximal lesion.  The AV groove circumflex was  small caliber, about 1.5-2 mm.  There was also a branching OM1.  Both the  distal AV circumflex and the distal OM1 were tiny caliber, probably 1.5 mm,  with diffuse minor irregularities.  There was no flow-limiting CAD in the  left circumflex.  It covered a very small territory of myocardium.   The right coronary artery was a hugh vessel.  It was dominant.  It gave off  a very large PDA which fed most of the inferolateral wall, as well as large  PL.  There was a 30% proximal lesion in the RCA.  There was a 60% heavily  calcified and irregular lesion in the mid RCA and there was a 90% lesion in  the mid to distal PDA.  It appeared that the mid RCA lesion was recanalized  from a previous infarction.   Left ventriculograms throughout the RAO approach showed an EF of 35-40% with  severe inferior akinesis spanning the whole inferior wall.  There was 2-3+  mitral regurgitation.  On panning down over the  aorta after the V gram,  there was evidence of significant ascending aorta dilatation, as well as  moderate atherosclerotic plaquing in the abdominal aorta.   ASSESSMENT:  1.  Previous large inferior myocardial infarction.  Suspect previous mid RCA      occlusion with recannalization as the culprit.  2.  Distal PDA lesion.  3.  Borderline lesion in mid LAD with negative previous functional study.  4.  Moderately decreased EF with normal LV filling pressures.  5.  Dilated ascending aorta.  6.  2-3+ mitral regurgitation.   RECOMMENDATIONS:  1.  Continue medical therapy for ischemic cardiomyopathy.  2.  Consider cardiac MRI to evaluate ascending aortic size and mitral      regurgitation.  Of note, the films were reviewed with Dr. Andee Lineman in the      presence of [name] and his son-in-law.  The patient will follow      up with Dr. Andee Lineman in clinic as scheduled.      DB/MEDQ  D:  01/02/2005  T:  01/02/2005  Job:  [id number]   cc:   Georgina Quint. Plotnikov, M.D. [facility]

## 2011-02-09 NOTE — Assessment & Plan Note (Signed)
[facility]                            CARDIOLOGY OFFICE NOTE   ERVINE, [name]                     MRN:          [id number]  DATE:01/28/2007                            DOB:          1926/04/20    PRIMARY CARDIOLOGIST:  Everardo Beals. Juanda Chance, MD, [facility].   PRIMARY CARE Matsue Strom:  Elvina Sidle, M.D.   PATIENT PROFILE:  A 77 year old Caucasian male with prior history of  coronary artery disease, who was noted to have PVCs during monitoring  for oral surgery on May 5th.   PROBLEM LIST:  1. Coronary artery disease.      a.     Cardiac catheterization in April, 2006 showing an EF of 30-       40% with 2-3+ mitral regurgitation and otherwise mostly       nonobstructive CAD with a 90% stenosis in the distal PDA.  2. Ischemic versus nonischemic cardiomyopathy.      a.     On August 26, 2006, a 2D echocardiogram with an EF of 25-       35%, moderately to markedly decreased LV systolic function with       mildly to moderately dilated left ventricle.  Mild diffuse LV       hypokinesis and akinesis of the entire posterolateral wall.  There       was 3+ mitral regurgitation and a mildly increased peak pulmonary       artery systolic pressure.  3. Mitral regurgitation 3+.      a.     See echocardiogram report above.  4. Hypertension.  5. Hyperlipidemia.  6. Hypothyroidism.  7. Chronic renal insufficiency.  8. History of asymptomatic PVCs.   HISTORY OF PRESENT ILLNESS:  A 77 year old Caucasian male who is [place]-  speaking only, who is accompanied by his daughter today.  Mr.  [name] has recently been evaluated by Dr. Royston Sinner from the  oral surgery center secondary to tooth decay requiring teeth  extractions.  He underwent extraction a couple of weeks ago, which was  uneventful, and then represented yesterday for additional teeth  extractions and was noted on the monitor to have frequent PVCs.  We do  have a faxed copy of some monitoring  strips.  We do not see any  sustained ventricular rhythms.  He was apparently treated with a  lidocaine bolus of 100 mg with resolution of PVCs.  The procedure was  cancelled, and anesthesia was reversed.  Patient was advised to follow  up with [place] today.  Per the patient's daughter, who is acting as a  Nurse, learning disability, he has been very active since last visit in December.  In  December, we had noted a decrease in LV function, and Dr. Juanda Chance  initially recommended right and left heart cardiac catheterization to  further evaluate for ischemia or obstructive coronary disease as well as  have a closer look at his valves.  The patient's daughter, however,  after discussion with a family friend who is a physician, decided to  take a more conservative approach, and the patient has been exercising  for 40  minutes each morning and then walking 40 minutes twice daily  without any chest pain or shortness of breath.  He has actually been  doing quite well without limitations.  After discussion with Dr. Juanda Chance  in January, the decision was made to just continue watchful waiting  rather than pursue catheterization at this time.  Mr. [name] and  his daughter are not interested in catheterization at this point or even  a noninvasive evaluation for ischemia.  With regards to his PVCs, his  daughter pointed out that he has been evaluated for this in the past.  In fact, he did wear a monitor in 2005 which showed occasional PVCs,  which had always been asymptomatic.  His daughter says that he has been  told about this for a long time and thinks that he has had this for a  good portion of his life.  He denies any presyncope or syncope, and as  above, he is otherwise doing quite well.  He denies PND, orthopnea, or  edema.   HOME MEDICATIONS:  1. Pindolol 2.5 mg b.i.d.  2. Flomax 0.4 mg daily.  3. Aspirin 81 mg daily.  4. Fish oil daily.  5. Protonix 40 mg p.r.n.  6. Synthroid 100 mcg daily.  7. Cozaar 25  mg b.i.d.  8. Niaspan 500 mg daily.  9. Lasix 20 mg daily.  10.Ambien 10 mg nightly.   PHYSICAL EXAMINATION:  VITAL SIGNS:  Blood pressure 131/71, heart rate  75, respirations 16.  Weight is 145 pounds.  GENERAL:  A pleasant white male in no acute distress.  Awake, alert and  oriented x3.  NECK:  No bruits, JVD.  LUNGS:  Respirations are unlabored to auscultation.  HEART:  Regular S1 and S2.  No S3, S4, or murmurs.  ABDOMEN:  Soft, nontender, nondistended.  Bowel sounds are present x4.  EXTREMITIES:  Pink, warm and dry.  No clubbing, cyanosis or edema.  Dorsalis pedis and posterior tibial pulses 2+ and equal bilaterally.   ACCESSORY CLINICAL FINDINGS:  EKG shows sinus rhythm with PACs and one  ventricular couplet.   ASSESSMENT/PLAN:  1. Occasional premature ventricular contractions:  This appears to be      a long-term issue with this patient.  He has been asymptomatic in      the past.  I advised that a typical workup would involve a BMET,      magnesium, along with a nuclear study to rule out ischemic origin      of ventricular ectopy as well as a 2D echocardiogram.  He has      already had a 2D echocardiogram in December, 2007, which shows      abnormally low left ventricular function.  He and his daughter      prefer to defer any additional ischemic evaluation at this point,      as he is otherwise very active without limitations and overall      feeling well.  They also would prefer to avoid any lab work at this      point.  He is on beta blocker therapy already.  We would just      recommend continuation of that.  2. Coronary artery disease:  This was primarily nonobstructive by last      catheterization in 2006.  He has been medically managed.  As noted      above, the patient was newly found to have a drop in his EF in  December; however, prefers to defer any additional evaluation until     he is more symptomatic.  He remains on beta blocker, aspirin, and      ARB  therapy.  He has had myalgias and statins in the past.  3. Hyperlipidemia:  He is on fish oil.  He has had myalgias with      statins in the past.  4. Mitral regurgitation 3+.  This was last evaluated by 2D      echocardiogram in December.  Patient is currently doing well and is      active without symptoms.  Likely plan on follow-up echo in December      or sooner if he becomes more symptomatic.   DISPOSITION:  Patient will follow up with Dr. Juanda Chance in approximately  two months.      Nicolasa Ducking, ANP  Electronically Signed      Jonelle Sidle, MD  Electronically Signed   CB/MedQ  DD: 01/28/2007  DT: 01/29/2007  Job #: [id number]

## 2011-02-09 NOTE — Assessment & Plan Note (Signed)
[facility]                            CARDIOLOGY OFFICE NOTE   DARYLE, AMIS                     MRN:          [id number]  DATE:09/02/2006                            DOB:          03/14/1926    CLINICAL HISTORY:  Mr. Genther is 77 years old, moved here from  [place] and does not speak [place].  His daughter is with him who speaks  English very well.   I saw him recently after he had signs and symptoms of congestive heart  failure which were treated by Dr. Isidor Holts with diuretics with  significant improvement.   Mr. Higginson had a previous catheterization performed in April 2006  which showed an ejection fraction of 34% to 40%, 2+ to 3+ mitral  regurgitation, and mostly nonobstructive coronary disease with 90%  stenosis in the distal posterior ascending branch.  We obtained an  echocardiogram, and this showed his ejection fraction had fallen to 25%  to 30%, and his mitral regurgitation was estimated at 3+ or moderately  severe.   Overall, he has been feeling fairly well.  He has not had any major  shortness of breath.  He says he maybe has a little dizziness when his  blood pressure gets down in the 105 range, which he takes very  regularly.   PAST MEDICAL HISTORY:  1. Hyperlipidemia.  2. Hypertension.  3. Treated hypothyroidism.   CURRENT MEDICATIONS:  Pindolol, Flomax, aspirin, Protonix, Synthroid,  Ambien, furosemide, levothyroxine and Cozaar.   EXAMINATION:  Blood pressure was 111/69.  Pulse 75 and regular.  There was no venous distention.  The carotid pulses were full without  bruits.  CHEST:  Clear.  CARDIAC:  Rhythm was regular.  There is a 2/6 pansystolic murmur at the  apex.  ABDOMEN:  Soft with normal bowel sounds.  There was no  hepatosplenomegaly.  Peripheral pulses were full.  There was no peripheral edema.   IMPRESSION:  1. Congestive heart failure related to systolic dysfunction, now      improved and  stable.  2. Cardiomyopathy, ischemic versus nonischemic with fallen ejection      fraction from 35% to 40% by catheterization April 2006 to 25% to      35% by echocardiography.  3. Hypertension, controlled.  4. Hyperlipidemia, off cholesterol medicines due to elevated liver      function tests.  5. Mild to moderate renal insufficiency with a creatinine 1.4.   RECOMMENDATIONS:  1. Mr. Millikan's ejection fraction is significantly lower than      previously.  Although symptomatically he is better, I think it is      important to find out the reason for his low ejection fraction and      further define the severity of his MR, and decide about further      treatment.  I think the best way to do this is with a right and      left heart catheterization.  He and his daughter would prefer to      wait until after Christmas because his wife has been sick, and I  think that is not unreasonable.  We will plan to bring him in      either the week after Christmas or the 2nd week in January and      decide for the procedure.  2. We will leave his medicines the same, and I will plan to see him      back prior to his procedure.     Bruce Elvera Lennox Juanda Chance, MD, [facility]  Electronically Signed    BRB/MedQ  DD: 09/02/2006  DT: 09/02/2006  Job #: [id number]

## 2011-02-15 ENCOUNTER — Encounter: Payer: Self-pay | Admitting: Physician Assistant

## 2011-02-21 ENCOUNTER — Encounter: Payer: Self-pay | Admitting: Physician Assistant

## 2011-02-21 ENCOUNTER — Ambulatory Visit: Payer: Medicare Other | Admitting: Cardiology

## 2011-02-21 ENCOUNTER — Ambulatory Visit (INDEPENDENT_AMBULATORY_CARE_PROVIDER_SITE_OTHER): Payer: Medicare Other | Admitting: Physician Assistant

## 2011-02-21 ENCOUNTER — Ambulatory Visit (INDEPENDENT_AMBULATORY_CARE_PROVIDER_SITE_OTHER): Payer: Medicare Other | Admitting: *Deleted

## 2011-02-21 VITALS — BP 110/62 | HR 64 | Resp 18 | Ht 61.0 in | Wt 140.8 lb

## 2011-02-21 DIAGNOSIS — I5022 Chronic systolic (congestive) heart failure: Secondary | ICD-10-CM

## 2011-02-21 DIAGNOSIS — I2510 Atherosclerotic heart disease of native coronary artery without angina pectoris: Secondary | ICD-10-CM

## 2011-02-21 DIAGNOSIS — I4891 Unspecified atrial fibrillation: Secondary | ICD-10-CM

## 2011-02-21 DIAGNOSIS — E785 Hyperlipidemia, unspecified: Secondary | ICD-10-CM

## 2011-02-21 DIAGNOSIS — G4700 Insomnia, unspecified: Secondary | ICD-10-CM

## 2011-02-21 MED ORDER — WARFARIN SODIUM 1 MG PO TABS: 1.0000 mg | ORAL_TABLET | ORAL | Status: DC

## 2011-02-21 MED ORDER — POTASSIUM CHLORIDE 20 MEQ PO PACK: 20.0000 meq | PACK | Freq: Every day | ORAL | Status: DC

## 2011-02-21 NOTE — Assessment & Plan Note (Signed)
No angina.  On coumadin, so no ASA.

## 2011-02-21 NOTE — Progress Notes (Signed)
History of Present Illness: Primary Cardiologist:  Dr. Marca Ancona  [name] is a 77 y.o. male with a history of CAD, ischemic cardiomyopathy, chronic kidney disease and paroxysmal atrial fibrillation, on amiodarone therapy and Coumadin presents for follow up.  He was last seen by Dr. Shirlee Latch 2/12.  Follow up labs demonstrated significant dyslipidemia.  He was asked to start on pravastatin.  LFTs were okay.  TSH has not been checked.  Follow up echocardiogram demonstrated an EF of 30-35%, moderate to severe mitral regurgitation, which was slightly progressed from prior.  Recommendation was to eventually try to get him on ACE inhibitor.  He returns for follow up.  He has occasional dizziness.  Sounds like he is having balance issues.  No syncope or near syncope.  No chest pain.  No significant DOE.  Walks 3 miles a day without problems.  No orthopnea or PND.  No edema.  Has trouble sleeping.  Taking Ambien without relief.  Got Valerian root OTC.  Denies being depressed.    Past Medical History  Diagnosis Date  . CAD (coronary artery disease)     [facility] 4/06 with 60% pLAD, 40% ramus, 60% mRCA, and 90% mPDA. No intervention. Inferior akinesis on LV-gram was suggestive of possible inferoir MI with recanalization.   . Hyperlipidemia     Myalgias with Lipitor and Crestor   . Hypothyroidism   . Peripheral vascular disease   . Anxiety   . Skin cancer     Hx of 2008 BCC  . Hx of colonic polyps   . Paroxysmal atrial fibrillation 02/2009     with rapid ventricular response treated with amiodarone therapy converting to normal sinus rhythm  . Systolic CHF     a.  Probable ischemic cardiomyopathy. Echo (6/10) with EF 40%, mildly dilated LV, moderate MR. ;  b.  echo 3/12: mild LVH, EF 30-35%, IL HK and Lat HK, mod to severe MR, trivial AI, mod LAE  . PNA (pneumonia)     History of   . CKD (chronic kidney disease)     Baseline creatinine around 1.4  . Mitral regurgitation     Moderate by echo 6/10.  2-3+ by cath 2006; mod to severe by echo 3/12  . PVC's (premature ventricular contractions)     Current Outpatient Prescriptions  Medication Sig Dispense Refill  . amiodarone (PACERONE) 200 MG tablet Take 200 mg by mouth daily. Take 1/2 tablet       . co-enzyme Q-10 30 MG capsule Take 100 mg by mouth daily.        . furosemide (LASIX) 40 MG tablet Take 40 mg by mouth 2 (two) times daily.        Marland Kitchen levothyroxine (SYNTHROID, LEVOTHROID) 100 MCG tablet Take 100 mcg by mouth daily.        . metoprolol succinate (TOPROL-XL) 25 MG 24 hr tablet Take 25 mg by mouth daily.        . multivitamin-iron-minerals-folic acid (CENTRUM) chewable tablet Chew 1 tablet by mouth daily.        . Omega-3 Fatty Acids (FISH OIL) 1200 MG CAPS Take by mouth 2 (two) times daily.        . potassium chloride (KLOR-CON) 20 MEQ packet Take 20 mEq by mouth daily.        . pravastatin (PRAVACHOL) 80 MG tablet Take 1 tablet (80 mg total) by mouth every evening.  30 tablet  6  . senna-docusate (SENOKOT-S) 8.6-50 MG per tablet Take 1 tablet  by mouth daily.        . Tamsulosin HCl (FLOMAX) 0.4 MG CAPS Take 0.4 mg by mouth.        . warfarin (COUMADIN) 1 MG tablet Take 1 tablet (1 mg total) by mouth as directed.  60 tablet  3  . zolpidem (AMBIEN CR) 12.5 MG CR tablet Take 10 mg by mouth at bedtime as needed. 1/2 or 1       . DISCONTD: warfarin (COUMADIN) 1 MG tablet Take 1 tablet (1 mg total) by mouth as directed.  60 tablet  3    Allergies: Allergies  Allergen Reactions  . Atenolol     REACTION: brady  . Ezetimibe-Simvastatin     REACTION: spasms  . Rosuvastatin     Vital Signs: BP 110/62  Pulse 64  Resp 18  Ht 5\' 1"  (1.549 m)  Wt 140 lb 12.8 oz (63.866 kg)  BMI 26.60 kg/m2  PHYSICAL EXAM: Well nourished, well developed, in no acute distress HEENT: normal Neck: no JVD Vascular: no carotid bruits Endocrine: no thyromegaly Cardiac:  normal S1, S2; RRR; 2/6 holosystolic murmur at apex Lungs:  clear to  auscultation bilaterally, no wheezing, rhonchi or rales Abd: soft, nontender, no hepatomegaly Ext: no edema Skin: warm and dry Neuro:  CNs 2-12 intact, no focal abnormalities noted  EKG:  Normal sinus rhythm, heart rate 64, normal axis, poor R-wave progression, IVCD, nonspecific ST-T wave changes, no significant change since prior tracings  ASSESSMENT AND PLAN:

## 2011-02-21 NOTE — Assessment & Plan Note (Signed)
Pravastatin increased last month.  Check Lipids before next visit.

## 2011-02-21 NOTE — Assessment & Plan Note (Signed)
Maintaining NSR.  On low dose Amiodarone.  LFTs ok last time.  No TSH.  Get CMET and TSH with labs before next visit.

## 2011-02-21 NOTE — Patient Instructions (Signed)
Your physician recommends that you schedule a follow-up appointment in: 05/29/11 3:45 PM WITH DR. Shirlee Latch AS PER SCOTT WEAVER, PA-C  Your physician recommends that you return for lab work in: 05/22/11 FOR LAB WORK; TSH 427.31, FASTING LIPID/LIVER PANEL 272.4, AND A BMET 428.22. YOU CAN COME IN BETWEEN THE HOURS OF 8:30 AM AND 4 PM ON THIS DAY.

## 2011-02-21 NOTE — Assessment & Plan Note (Addendum)
Had a long discussion with him (via his daughter who interprets) re: the benefits of ACE inhibitor therapy.  He notes his BP is in the 90s at home and is not comfortable even starting a low dose (Lisinopril 2.5 mg QD).  Will hold off on changing meds.  Follow up with Dr. Shirlee Latch in 3 months.  Given advanced age, would not be a candidate for ICD.

## 2011-02-21 NOTE — Assessment & Plan Note (Signed)
Follow up with PCP.  May benefit from SSRI or something like Mirtazapine, which would help him sleep.  Should be ok with cardiac meds.  Would avoid QT prolonging drugs (i.e. Celexa).

## 2011-02-26 ENCOUNTER — Telehealth: Payer: Self-pay | Admitting: Cardiology

## 2011-02-26 NOTE — Telephone Encounter (Signed)
I talked with Dorene Grebe at [facility]. Klor packets were ordered and not KLor tablets. They will continue to fill KLor tablets as pt had been getting. Daughter is aware that pt will continue to get tablets

## 2011-02-26 NOTE — Telephone Encounter (Signed)
Pt taking khlor con tabs for 2 years -pt's dtr says the form was changed and insurance wil only cover the tabs, wants [place] to call [facility] and tell them he can take it in tablet form

## 2011-03-22 ENCOUNTER — Ambulatory Visit (INDEPENDENT_AMBULATORY_CARE_PROVIDER_SITE_OTHER): Payer: Medicare Other | Admitting: *Deleted

## 2011-03-22 DIAGNOSIS — I4891 Unspecified atrial fibrillation: Secondary | ICD-10-CM

## 2011-04-19 ENCOUNTER — Ambulatory Visit (INDEPENDENT_AMBULATORY_CARE_PROVIDER_SITE_OTHER): Payer: Medicare Other | Admitting: *Deleted

## 2011-04-19 DIAGNOSIS — I4891 Unspecified atrial fibrillation: Secondary | ICD-10-CM

## 2011-05-09 ENCOUNTER — Other Ambulatory Visit: Payer: Self-pay | Admitting: *Deleted

## 2011-05-09 ENCOUNTER — Other Ambulatory Visit (INDEPENDENT_AMBULATORY_CARE_PROVIDER_SITE_OTHER): Payer: Medicare Other | Admitting: *Deleted

## 2011-05-09 ENCOUNTER — Telehealth: Payer: Self-pay | Admitting: Cardiology

## 2011-05-09 DIAGNOSIS — I4891 Unspecified atrial fibrillation: Secondary | ICD-10-CM

## 2011-05-09 DIAGNOSIS — K068 Other specified disorders of gingiva and edentulous alveolar ridge: Secondary | ICD-10-CM

## 2011-05-09 DIAGNOSIS — K055 Other periodontal diseases: Secondary | ICD-10-CM

## 2011-05-09 DIAGNOSIS — Z79899 Other long term (current) drug therapy: Secondary | ICD-10-CM

## 2011-05-09 LAB — PROTIME-INR
INR: 3.3 ratio — ABNORMAL HIGH (ref 0.8–1.0)
Prothrombin Time: 32.5 s — ABNORMAL HIGH (ref 10.2–12.4)

## 2011-05-09 LAB — APTT: aPTT: 43.6 s — ABNORMAL HIGH (ref 21.7–28.8)

## 2011-05-09 NOTE — Telephone Encounter (Signed)
Per pt wife calling. Pt wife wants to know the results of pt lab work. Pt has appt at 4p and would like to know results of blood work prior to appt. Please return pt call to advise/discuss.

## 2011-05-09 NOTE — Progress Notes (Signed)
Addended by: Scherrie Bateman E on: 05/09/2011 09:03 AM   Modules accepted: Orders

## 2011-05-09 NOTE — Telephone Encounter (Signed)
Per pt wife call. Pt has spot on eye that is bleeding, today it is getting bigger. Pt is also bleeding from his gum. Called [facility], she will speak w/ Kennon Rounds about the situation.

## 2011-05-09 NOTE — Telephone Encounter (Signed)
PT'S DAUGHTER CALLED  ONCE AGAIN  SHE  SPOKE WITH URGENT CARE UNABLE TO DO FINGER STICK TO CHECK INR ONLY CAN DO  REG BLOOD DRAW AND WILL NEED 2 DAYS  TO GET RESULTS  BACK  DAUGHTER VERY UPSET SPOKE ONCE AGAIN WITH SALLY PUTT WILL HAVE PT COME IN TODAY FOR  A VENIPUNCTURE  AND HAVE DONE STAT WILL DISCUSS OUTCOME  WITH SCOTT WEAVER PAC OR DOD IF  SALLY  NOT AVAILABLE DAUGHTER AGREES WILL BRING PT IN AROUND 9:30 AM

## 2011-05-09 NOTE — Telephone Encounter (Signed)
CALLED [facility] AWAITING ON INR RESULTS  WILL CALL IN APPROX 5 MIN WITH VALUE./CY

## 2011-05-09 NOTE — Telephone Encounter (Signed)
SPOKE WITH SALLY PUTT  RE MESSAGE  PT NEEDS TO GO TO  URGENT CARE AND HAVE INR CHECKED  TODAY  UNABLE TO SEE IN OUR OFFICE TODAY PT'S WIFE AWARE. PER WIFE WILL SPEAK TO PT RE ABOVE IS VERY HESITANT  WITH  GOING TO URGENT ASSURED TO GO OR  GO TO PMD./CY

## 2011-05-09 NOTE — Telephone Encounter (Signed)
INR FROM TODAY WAS 3.26 DISCUSSED WITH SALLY PUTT PHARMACIST  INSTRUCTED TO HOLD COUMADIN TODAY THEN RESUME  AS PREVIOUSLY DIRECTED  AND KEEP COUMADIN CLINIC APPT   NEXT WEEK. NOTIFIED DAUGHTER OF ABOVE PLAN, AGREES WITH  ABOVE .[name]

## 2011-05-10 ENCOUNTER — Other Ambulatory Visit: Payer: Self-pay | Admitting: Family Medicine

## 2011-05-10 ENCOUNTER — Ambulatory Visit
Admission: RE | Admit: 2011-05-10 | Discharge: 2011-05-10 | Disposition: A | Discharge status: Home / Self Care | Payer: Medicare Other | Source: Ambulatory Visit | Attending: Family Medicine | Admitting: Family Medicine

## 2011-05-10 DIAGNOSIS — R339 Retention of urine, unspecified: Secondary | ICD-10-CM

## 2011-05-14 DIAGNOSIS — N138 Other obstructive and reflux uropathy: Secondary | ICD-10-CM | POA: Insufficient documentation

## 2011-05-17 ENCOUNTER — Telehealth: Payer: Self-pay | Admitting: *Deleted

## 2011-05-17 ENCOUNTER — Ambulatory Visit (INDEPENDENT_AMBULATORY_CARE_PROVIDER_SITE_OTHER): Payer: Medicare Other | Admitting: *Deleted

## 2011-05-17 ENCOUNTER — Other Ambulatory Visit (INDEPENDENT_AMBULATORY_CARE_PROVIDER_SITE_OTHER): Payer: Medicare Other | Admitting: *Deleted

## 2011-05-17 ENCOUNTER — Encounter: Payer: Medicare Other | Admitting: *Deleted

## 2011-05-17 DIAGNOSIS — I4891 Unspecified atrial fibrillation: Secondary | ICD-10-CM

## 2011-05-17 DIAGNOSIS — E785 Hyperlipidemia, unspecified: Secondary | ICD-10-CM

## 2011-05-17 LAB — BASIC METABOLIC PANEL
BUN: 19 mg/dL (ref 6–23)
Calcium: 8.6 mg/dL (ref 8.4–10.5)
Creatinine, Ser: 1.3 mg/dL (ref 0.4–1.5)
GFR: 58.04 mL/min — ABNORMAL LOW (ref >60.00)
Glucose, Bld: 106 mg/dL — ABNORMAL HIGH (ref 70–99)

## 2011-05-17 LAB — HEPATIC FUNCTION PANEL
AST: 23 U/L (ref 0–37)
Total Bilirubin: 0.9 mg/dL (ref 0.3–1.2)

## 2011-05-17 LAB — LIPID PANEL
Cholesterol: 166 mg/dL (ref 0–200)
HDL: 59.7 mg/dL (ref >39.00)
LDL Cholesterol: 96 mg/dL (ref 0–99)
Triglycerides: 52 mg/dL (ref 0.0–149.0)
VLDL: 10.4 mg/dL (ref 0.0–40.0)

## 2011-05-17 LAB — TSH: TSH: 4.82 u[IU]/mL (ref 0.35–5.50)

## 2011-05-17 LAB — POCT INR: INR: 2.1

## 2011-05-17 NOTE — Telephone Encounter (Signed)
s/w pt's daughter who cares for pt and she states she does want to start pt on any other medications until he sees his Urologist and cardio docs, she asked for lab results to be faxed to [facility]. Danielle Rankin

## 2011-05-22 ENCOUNTER — Other Ambulatory Visit: Payer: Medicare Other | Admitting: *Deleted

## 2011-05-29 ENCOUNTER — Ambulatory Visit (INDEPENDENT_AMBULATORY_CARE_PROVIDER_SITE_OTHER): Payer: Medicare Other | Admitting: Cardiology

## 2011-05-29 ENCOUNTER — Encounter: Payer: Self-pay | Admitting: Cardiology

## 2011-05-29 DIAGNOSIS — I5022 Chronic systolic (congestive) heart failure: Secondary | ICD-10-CM

## 2011-05-29 DIAGNOSIS — I080 Rheumatic disorders of both mitral and aortic valves: Secondary | ICD-10-CM

## 2011-05-29 DIAGNOSIS — E785 Hyperlipidemia, unspecified: Secondary | ICD-10-CM

## 2011-05-29 DIAGNOSIS — I2510 Atherosclerotic heart disease of native coronary artery without angina pectoris: Secondary | ICD-10-CM

## 2011-05-29 DIAGNOSIS — I4891 Unspecified atrial fibrillation: Secondary | ICD-10-CM

## 2011-05-29 DIAGNOSIS — I5023 Acute on chronic systolic (congestive) heart failure: Secondary | ICD-10-CM

## 2011-05-29 DIAGNOSIS — R0602 Shortness of breath: Secondary | ICD-10-CM

## 2011-05-29 MED ORDER — LISINOPRIL 2.5 MG PO TABS: 2.5000 mg | ORAL_TABLET | Freq: Every day | ORAL | Status: DC

## 2011-05-29 NOTE — Assessment & Plan Note (Signed)
Maintaining NSR on amiodarone.  Continue amiodarone, Toprol XL, warfarin.  LFTs and TSH were recently normal. Needs yearly eye exam.

## 2011-05-29 NOTE — Assessment & Plan Note (Addendum)
EF 30-35% on last echo, seems to be NYHA class II symptomatically.  Not volume overloaded on exam.  Will have him continue the current doses of Lasix and Toprol XL.  Marginal BP has made medication titration difficult.  I will have him start lisinopril at a low dose, 2.5 mg daily.  BMET/BNP in 2 weeks.

## 2011-05-29 NOTE — Progress Notes (Signed)
PCP: Dr. Milus Glazier  77 yo with history of CAD, ischemic CMP, CKD, and paroxysmal atrial fibrillation presents for cardiology followup. He speaks little [place] and his daughter interprets. He has been doing well symptomatically from a cardiac standpoint. He has felt no sustained atrial fibrillation. He will occasionally feel some mild fluttering in his chest. He is in sinus rhythm today.  No chest pain.  He walks about 3 miles a day with no exertional dyspnea.  He has some occasional balance difficulty but no falls or lightheadedness.  Patient has a history of myalgias with Crestor and Lipitor.  He has tolerated pravastatin without myalgias so far.   Main problem recently has been urinary retention.  He has a foley catheter in place now and a leg bag.  He says that he thinks the catheter is going to stay in for several months.    ECG: NSR, rightward axis, mildly increased QTc.   Labs (9/11): K 4.1, creatinine 1.4  Labs (8/12): K 3.8, creatinine 1.3, TSH normal, LFTs normal, LDL 96, HDL 50  Allergies (verified):  1) ! Crestor  2) Vytorin  3) Atenolol   Past Medical History:  1. Coronary artery disease: LHC 4/06 with 60% pLAD, 40% ramus, 60% mRCA, and 90% mPDA. No intervention. Inferior akinesis on LV-gram was suggestive of possible inferior MI with recanalization.  2. Hyperlipidemia: Myalgias with Lipitor and Crestor.  3. Hypothyroidism  4. Peripheral vascular disease  5. Anxiety  6. Skin cancer, hx of 2008 BCC  7. Colonic polyps, hx of  8. Paroxysmal atrial fibrillation with rapid ventricular response treated with amiodarone therapy converting to normal sinus rhythm 02/2009  9. Systolic CHF: Probable ischemic cardiomyopathy. Echo (6/10) with EF 40%, mildly dilated LV, moderate MR.  Echo (3/12): EF 30-35%, inferolateral severe hypokinesis, trivial AI, moderate to severe MR.  10. History of PNA  11. CKD. Baseline creatinine around 1.4.  12. Mitral regurgitation: Moderate by echo 6/10. 2-3+  by cath 2006.  Moderate-severe by echo in 3/12.  13. PVCs   Family History:  Family History of CAD Male 1st degree relative <60   Social History:  Retired  Originally from [place], speaks little English  Married  Former Smoker   Review of Systems:  All systems reviewed and negative except as per HPI.   Current Outpatient Prescriptions  Medication Sig Dispense Refill  . amiodarone (PACERONE) 200 MG tablet Take 1/2 tablet daily      . co-enzyme Q-10 30 MG capsule Take 100 mg by mouth daily.        . fluconazole (DIFLUCAN) 150 MG tablet Take 150 mg by mouth once.        . furosemide (LASIX) 40 MG tablet Take 40 mg by mouth 2 (two) times daily.        Marland Kitchen levothyroxine (SYNTHROID, LEVOTHROID) 100 MCG tablet Take 100 mcg by mouth daily.        . metoprolol succinate (TOPROL-XL) 25 MG 24 hr tablet Take 25 mg by mouth daily.        . Omega-3 Fatty Acids (FISH OIL) 1200 MG CAPS Take by mouth 2 (two) times daily.        . potassium chloride (KLOR-CON) 20 MEQ packet Take 20 mEq by mouth daily.  30 packet  6  . pravastatin (PRAVACHOL) 80 MG tablet Take 1 tablet (80 mg total) by mouth every evening.  30 tablet  6  . senna-docusate (SENOKOT-S) 8.6-50 MG per tablet Take 1 tablet by mouth daily.        Marland Kitchen  Tamsulosin HCl (FLOMAX) 0.4 MG CAPS Take 0.4 mg by mouth.        . warfarin (COUMADIN) 1 MG tablet Take 1 tablet (1 mg total) by mouth as directed.  60 tablet  3  . zolpidem (AMBIEN CR) 12.5 MG CR tablet Take 10 mg by mouth at bedtime as needed. 1/2 or 1       . lisinopril (ZESTRIL) 2.5 MG tablet Take 1 tablet (2.5 mg total) by mouth daily.  30 tablet  6    BP 121/71  Pulse 66  Resp 14  Wt 136 lb (61.689 kg) General: NAD Neck: No JVD, no thyromegaly or thyroid nodule.  Lungs: Clear to auscultation bilaterally with normal respiratory effort. CV: Nondisplaced PMI.  Heart regular S1/S2, no S3/S4, 2/6 HSM at apex.  Trace ankle edema.  No carotid bruit.  Normal pedal pulses.  Abdomen: Soft,  nontender, no hepatosplenomegaly, no distention.  Neurologic: Alert and oriented x 3.  Psych: Normal affect. Extremities: No clubbing or cyanosis.

## 2011-05-29 NOTE — Patient Instructions (Signed)
Start Lisinopril 2.5mg  daily.  Your physician recommends that you return for lab work in: 2 weeks--BMP/BNP 414.01  427.31  428.22  Your physician recommends that you return for a FASTING lipid profile/liver profile in 4 months 414.01  427.31  428.22   Your physician wants you to follow-up in: 4 months with Dr Shirlee Latch.(January 2013).You will receive a reminder letter in the mail two months in advance. If you don't receive a letter, please call our office to schedule the follow-up appointment.

## 2011-05-29 NOTE — Assessment & Plan Note (Signed)
LDL better on pravastatin 80 mg daily but not at goal (LDL < 70).  He is tolerating pravastatin without myalgias.  Had myalgias with Lipitor and Crestor.  Amiodarone use precludes more than very low dose simvastatin.  Could consider addition of Zetia or Niaspan, but for now will have him work on diet and exercise.

## 2011-05-29 NOTE — Assessment & Plan Note (Signed)
No ischemic symptoms.  Continue warfarin, Toprol XL, statin. No advantage to adding aspirin to warfarin in stable CAD.

## 2011-05-29 NOTE — Assessment & Plan Note (Signed)
Last echo showed moderate to severe MR.  EF is 30-35%.  He has NYHA class II symptoms it seems and is not volume overloaded on exam.  Given age and medical condition, would plan medical management.  Will add lisinopril 2.5 mg daily for afterload reduction.

## 2011-06-12 ENCOUNTER — Other Ambulatory Visit: Payer: Medicare Other | Admitting: *Deleted

## 2011-06-14 ENCOUNTER — Other Ambulatory Visit (INDEPENDENT_AMBULATORY_CARE_PROVIDER_SITE_OTHER): Payer: Medicare Other | Admitting: *Deleted

## 2011-06-14 ENCOUNTER — Ambulatory Visit (INDEPENDENT_AMBULATORY_CARE_PROVIDER_SITE_OTHER): Payer: Medicare Other | Admitting: *Deleted

## 2011-06-14 DIAGNOSIS — I4891 Unspecified atrial fibrillation: Secondary | ICD-10-CM

## 2011-06-14 DIAGNOSIS — R0602 Shortness of breath: Secondary | ICD-10-CM

## 2011-06-14 DIAGNOSIS — I5023 Acute on chronic systolic (congestive) heart failure: Secondary | ICD-10-CM

## 2011-06-14 DIAGNOSIS — I2510 Atherosclerotic heart disease of native coronary artery without angina pectoris: Secondary | ICD-10-CM

## 2011-06-14 LAB — POCT INR: INR: 2.7

## 2011-06-15 LAB — HEPATIC FUNCTION PANEL
ALT: 16 U/L (ref 0–53)
Alkaline Phosphatase: 45 U/L (ref 39–117)
Bilirubin, Direct: 0.1 mg/dL (ref 0.0–0.3)
Total Bilirubin: 0.8 mg/dL (ref 0.3–1.2)
Total Protein: 7.5 g/dL (ref 6.0–8.3)

## 2011-06-15 LAB — LIPID PANEL: Cholesterol: 164 mg/dL (ref 0–200)

## 2011-06-15 LAB — BASIC METABOLIC PANEL
Calcium: 8.5 mg/dL (ref 8.4–10.5)
Creatinine, Ser: 1.4 mg/dL (ref 0.4–1.5)

## 2011-06-15 LAB — BRAIN NATRIURETIC PEPTIDE: Pro B Natriuretic peptide (BNP): 184 pg/mL — ABNORMAL HIGH (ref 0.0–100.0)

## 2011-06-21 ENCOUNTER — Other Ambulatory Visit: Payer: Self-pay | Admitting: Cardiology

## 2011-07-05 ENCOUNTER — Other Ambulatory Visit: Payer: Self-pay | Admitting: Cardiology

## 2011-07-06 NOTE — Telephone Encounter (Signed)
Calling wanting to know if and when pt should get flu shot. Please return call to discuss further.

## 2011-07-06 NOTE — Telephone Encounter (Signed)
Spoke with pt's daughter and told her it was OK for pt to get flu shot. He has had flu shots in the past and had no problems after receiving it.

## 2011-07-13 ENCOUNTER — Ambulatory Visit (INDEPENDENT_AMBULATORY_CARE_PROVIDER_SITE_OTHER): Payer: Medicare Other | Admitting: *Deleted

## 2011-07-13 DIAGNOSIS — I4891 Unspecified atrial fibrillation: Secondary | ICD-10-CM

## 2011-07-14 ENCOUNTER — Other Ambulatory Visit: Payer: Self-pay | Admitting: Cardiology

## 2011-08-06 ENCOUNTER — Ambulatory Visit (INDEPENDENT_AMBULATORY_CARE_PROVIDER_SITE_OTHER): Payer: Medicare Other | Admitting: *Deleted

## 2011-08-06 DIAGNOSIS — I4891 Unspecified atrial fibrillation: Secondary | ICD-10-CM

## 2011-08-10 ENCOUNTER — Encounter: Payer: Medicare Other | Admitting: *Deleted

## 2011-08-21 ENCOUNTER — Telehealth: Payer: Self-pay | Admitting: Cardiology

## 2011-08-21 NOTE — Telephone Encounter (Signed)
I talked with pt's daughter. Pt went to urologist this morning for a procedure and his heart rate was 47. Pt's pulse  was checked 15-20 minutes later and it was 60. Pt's pulse now is 60 and his BP is 115/60. Pt has felt fine and denies any lightheadedness or weakness. I reviewed with Dr Shirlee Latch. Per Dr Era Skeen changes. Pt to monitor BP and pulse and call if any changes or symptoms.

## 2011-08-21 NOTE — Telephone Encounter (Signed)
New problem Pt's daughter is calling Pt went to urologist and his pulse was 47. She wants a call back. Does he need to be seen today? She said he feels ok.

## 2011-08-27 ENCOUNTER — Ambulatory Visit (INDEPENDENT_AMBULATORY_CARE_PROVIDER_SITE_OTHER): Payer: Medicare Other | Admitting: *Deleted

## 2011-08-27 ENCOUNTER — Other Ambulatory Visit: Payer: Self-pay | Admitting: Cardiology

## 2011-08-27 DIAGNOSIS — I4891 Unspecified atrial fibrillation: Secondary | ICD-10-CM

## 2011-08-27 LAB — POCT INR: INR: 2.3

## 2011-08-28 ENCOUNTER — Other Ambulatory Visit: Payer: Self-pay | Admitting: Cardiology

## 2011-08-29 NOTE — Telephone Encounter (Signed)
Pt's dtr states pharmacy called 3 days ago, told her it was yesterday at 103pm, he is out and she's requesting we call it in today, please

## 2011-09-14 ENCOUNTER — Ambulatory Visit (INDEPENDENT_AMBULATORY_CARE_PROVIDER_SITE_OTHER): Payer: Medicare Other | Admitting: *Deleted

## 2011-09-14 DIAGNOSIS — I4891 Unspecified atrial fibrillation: Secondary | ICD-10-CM

## 2011-09-14 LAB — POCT INR: INR: 2.4

## 2011-10-02 ENCOUNTER — Telehealth: Payer: Self-pay | Admitting: *Deleted

## 2011-10-02 NOTE — Telephone Encounter (Signed)
A user error has taken place: wrong pt...10/02/11@1 :18pm/LMB

## 2011-10-03 ENCOUNTER — Other Ambulatory Visit: Payer: Self-pay | Admitting: Internal Medicine

## 2011-10-04 ENCOUNTER — Telehealth: Payer: Self-pay | Admitting: *Deleted

## 2011-10-04 NOTE — Telephone Encounter (Signed)
Rec fax requesting Levothroid 100 mcg is on BO. They are requesting new rx for levothyroxine or Synthroid. Do you know this pt?

## 2011-10-08 ENCOUNTER — Ambulatory Visit (INDEPENDENT_AMBULATORY_CARE_PROVIDER_SITE_OTHER): Payer: Medicare Other | Admitting: *Deleted

## 2011-10-08 ENCOUNTER — Other Ambulatory Visit (INDEPENDENT_AMBULATORY_CARE_PROVIDER_SITE_OTHER): Payer: Medicare Other | Admitting: *Deleted

## 2011-10-08 ENCOUNTER — Other Ambulatory Visit: Payer: Self-pay | Admitting: *Deleted

## 2011-10-08 DIAGNOSIS — I4891 Unspecified atrial fibrillation: Secondary | ICD-10-CM

## 2011-10-08 LAB — BASIC METABOLIC PANEL
BUN: 22 mg/dL (ref 6–23)
Chloride: 100 mEq/L (ref 96–112)
Creatinine, Ser: 1.3 mg/dL (ref 0.4–1.5)
GFR: 55.93 mL/min — ABNORMAL LOW (ref >60.00)
Glucose, Bld: 101 mg/dL — ABNORMAL HIGH (ref 70–99)

## 2011-10-08 LAB — HEPATIC FUNCTION PANEL
ALT: 16 U/L (ref 0–53)
Albumin: 3.7 g/dL (ref 3.5–5.2)
Total Bilirubin: 0.8 mg/dL (ref 0.3–1.2)

## 2011-10-08 MED ORDER — WARFARIN SODIUM 1 MG PO TABS: ORAL_TABLET | ORAL | Status: DC

## 2011-10-09 ENCOUNTER — Telehealth: Payer: Self-pay | Admitting: Cardiology

## 2011-10-09 NOTE — Telephone Encounter (Signed)
Notified of recent lab results.

## 2011-10-09 NOTE — Telephone Encounter (Signed)
New Msg: pt calling wanting to know results of pt lab work yesterday. Please return call to discuss further.

## 2011-10-10 ENCOUNTER — Other Ambulatory Visit: Payer: Medicare Other | Admitting: *Deleted

## 2011-10-12 ENCOUNTER — Encounter: Payer: Medicare Other | Admitting: *Deleted

## 2011-10-14 ENCOUNTER — Encounter: Payer: Self-pay | Admitting: Family Medicine

## 2011-10-14 DIAGNOSIS — N401 Enlarged prostate with lower urinary tract symptoms: Secondary | ICD-10-CM

## 2011-10-14 DIAGNOSIS — R2681 Unsteadiness on feet: Secondary | ICD-10-CM

## 2011-10-14 DIAGNOSIS — N138 Other obstructive and reflux uropathy: Secondary | ICD-10-CM

## 2011-10-16 ENCOUNTER — Ambulatory Visit: Payer: Medicare Other | Admitting: Cardiology

## 2011-10-29 ENCOUNTER — Other Ambulatory Visit: Payer: Self-pay | Admitting: Family Medicine

## 2011-10-30 ENCOUNTER — Other Ambulatory Visit: Payer: Self-pay | Admitting: Family Medicine

## 2011-10-31 ENCOUNTER — Ambulatory Visit: Payer: Medicare Other | Admitting: Cardiology

## 2011-11-01 ENCOUNTER — Encounter: Payer: Self-pay | Admitting: Cardiology

## 2011-11-01 ENCOUNTER — Ambulatory Visit (INDEPENDENT_AMBULATORY_CARE_PROVIDER_SITE_OTHER): Payer: Medicare Other | Admitting: *Deleted

## 2011-11-01 ENCOUNTER — Ambulatory Visit (INDEPENDENT_AMBULATORY_CARE_PROVIDER_SITE_OTHER): Payer: Medicare Other | Admitting: Cardiology

## 2011-11-01 DIAGNOSIS — E785 Hyperlipidemia, unspecified: Secondary | ICD-10-CM

## 2011-11-01 DIAGNOSIS — I2510 Atherosclerotic heart disease of native coronary artery without angina pectoris: Secondary | ICD-10-CM

## 2011-11-01 DIAGNOSIS — I4891 Unspecified atrial fibrillation: Secondary | ICD-10-CM

## 2011-11-01 DIAGNOSIS — I080 Rheumatic disorders of both mitral and aortic valves: Secondary | ICD-10-CM

## 2011-11-01 DIAGNOSIS — I5022 Chronic systolic (congestive) heart failure: Secondary | ICD-10-CM

## 2011-11-01 LAB — POCT INR: INR: 2.9

## 2011-11-01 MED ORDER — POTASSIUM CHLORIDE CRYS ER 20 MEQ PO TBCR: 20.0000 meq | EXTENDED_RELEASE_TABLET | Freq: Two times a day (BID) | ORAL | Status: DC

## 2011-11-01 MED ORDER — LISINOPRIL 5 MG PO TABS: 5.0000 mg | ORAL_TABLET | Freq: Every day | ORAL | Status: DC

## 2011-11-01 NOTE — Patient Instructions (Signed)
Increase lisinopril to 5mg  daily.  Your physician recommends that you return for a FASTING lipid profile /BMET in 2 weeks 428.22 414.01  Your physician recommends that you schedule a follow-up appointment in: 4 months with Dr Shirlee Latch.

## 2011-11-04 NOTE — Assessment & Plan Note (Signed)
EF 30-35% on last echo, seems to be NYHA class II symptomatically.  Not volume overloaded on exam.  Will have him continue the current doses of Lasix and Toprol XL.   I will have him increase lisinopril to 5 mg daily today.  He will monitor his BP with the change.  BMET in 2 weeks.

## 2011-11-04 NOTE — Assessment & Plan Note (Signed)
Last echo showed moderate to severe MR.  EF is 30-35%.  He has NYHA class II symptoms it seems and is not volume overloaded on exam.  Given age and medical condition, would plan medical management.  Will lisinopril to 5 mg daily for afterload reduction.

## 2011-11-04 NOTE — Assessment & Plan Note (Signed)
Maintaining NSR on amiodarone.  Continue amiodarone, Toprol XL, warfarin.  LFTs and TSH were recently normal. Needs yearly eye exam.  

## 2011-11-04 NOTE — Progress Notes (Signed)
PCP: Dr. Milus Glazier  78 yo with history of CAD, ischemic CMP, CKD, and paroxysmal atrial fibrillation presents for cardiology followup. He speaks little [place] and his daughter interprets. He has been doing well symptomatically from a cardiac standpoint. He has felt no sustained atrial fibrillation and is in NSR today. He will occasionally feel some mild fluttering in his chest.  No chest pain.  He walks about 3 miles a day with no exertional dyspnea.  He has some occasional balance difficulty but no falls or lightheadedness.  Occasional vertigo-type symptoms.  Patient has a history of myalgias with Crestor and Lipitor.  He has tolerated pravastatin without myalgias so far.    Main problem recently has been urinary retention.  He still has a foley catheter in place now and a leg bag.    ECG: NSR,  mildly increased QTc.   Labs (9/11): K 4.1, creatinine 1.4  Labs (8/12): K 3.8, creatinine 1.3, TSH normal, LFTs normal, LDL 96, HDL 50 Labs (1/13): K 3.9, creatinine 1.3, TSH normal, LFTs normal  Allergies (verified):  1) ! Crestor  2) Vytorin  3) Atenolol   Past Medical History:  1. Coronary artery disease: LHC 4/06 with 60% pLAD, 40% ramus, 60% mRCA, and 90% mPDA. No intervention. Inferior akinesis on LV-gram was suggestive of possible inferior MI with recanalization.  2. Hyperlipidemia: Myalgias with Lipitor and Crestor.  3. Hypothyroidism  4. Peripheral vascular disease  5. Anxiety  6. Skin cancer, hx of 2008 BCC  7. Colonic polyps, hx of  8. Paroxysmal atrial fibrillation with rapid ventricular response treated with amiodarone therapy converting to normal sinus rhythm 02/2009  9. Systolic CHF: Probable ischemic cardiomyopathy. Echo (6/10) with EF 40%, mildly dilated LV, moderate MR.  Echo (3/12): EF 30-35%, inferolateral severe hypokinesis, trivial AI, moderate to severe MR.  10. History of PNA  11. CKD. Baseline creatinine around 1.4.  12. Mitral regurgitation: Moderate by echo 6/10.  2-3+ by cath 2006.  Moderate-severe by echo in 3/12.  13. PVCs   Family History:  Family History of CAD Male 1st degree relative <60   Social History:  Retired  Originally from [place], speaks little English  Married  Former Smoker   Review of Systems:  All systems reviewed and negative except as per HPI.   Current Outpatient Prescriptions  Medication Sig Dispense Refill  . amiodarone (PACERONE) 200 MG tablet TAKE 1/2 TABLET BY MOUTH DAILY  30 tablet  6  . co-enzyme Q-10 30 MG capsule Take 100 mg by mouth daily.        . furosemide (LASIX) 40 MG tablet Take 40 mg by mouth 2 (two) times daily.        Marland Kitchen levothyroxine (SYNTHROID, LEVOTHROID) 100 MCG tablet Take 100 mcg by mouth daily.        . metoprolol succinate (TOPROL-XL) 25 MG 24 hr tablet TAKE ONE TABLET BY MOUTH DAILY  30 tablet  6  . Omega-3 Fatty Acids (FISH OIL) 1200 MG CAPS Take by mouth 2 (two) times daily.        . potassium chloride (KLOR-CON) 20 MEQ packet Take 20 mEq by mouth daily.  30 packet  6  . pravastatin (PRAVACHOL) 80 MG tablet TAKE 1 TABLET (80 MG TOTAL) BY MOUTH EVERY EVENING.  30 tablet  6  . Tamsulosin HCl (FLOMAX) 0.4 MG CAPS Take 0.4 mg by mouth.        . warfarin (COUMADIN) 1 MG tablet Take as directed by Anticoagulation clinic  60  tablet  3  . zolpidem (AMBIEN CR) 12.5 MG CR tablet Take 10 mg by mouth at bedtime as needed. 1/2 or 1       . lisinopril (PRINIVIL,ZESTRIL) 5 MG tablet Take 1 tablet (5 mg total) by mouth daily.  30 tablet  6  . potassium chloride SA (K-DUR,KLOR-CON) 20 MEQ tablet Take 1 tablet (20 mEq total) by mouth 2 (two) times daily.  60 tablet  6    BP 108/60  Pulse 64  Wt 65.046 kg (143 lb 6.4 oz) General: NAD Neck: No JVD, no thyromegaly or thyroid nodule.  Lungs: Clear to auscultation bilaterally with normal respiratory effort. CV: Nondisplaced PMI.  Heart regular S1/S2, no S3/S4, 2/6 HSM at apex.  Trace ankle edema.  No carotid bruit.  Normal pedal pulses.  Abdomen: Soft,  nontender, no hepatosplenomegaly, no distention.  Neurologic: Alert and oriented x 3.  Psych: Normal affect. Extremities: No clubbing or cyanosis.

## 2011-11-04 NOTE — Assessment & Plan Note (Signed)
LDL better on pravastatin 80 mg daily.  He is tolerating pravastatin without myalgias.  Had myalgias with Lipitor and Crestor.  Amiodarone use precludes more than very low dose simvastatin.  Could consider addition of Zetia or Niaspan if further LDL-lowering needed, but for now will have him work on diet and exercise.  Will check lipids.

## 2011-11-04 NOTE — Assessment & Plan Note (Signed)
No ischemic symptoms.  Continue warfarin, Toprol XL, statin. No advantage to adding aspirin to warfarin in stable CAD.  

## 2011-11-05 ENCOUNTER — Encounter: Payer: Medicare Other | Admitting: *Deleted

## 2011-11-07 ENCOUNTER — Encounter: Payer: Medicare Other | Admitting: *Deleted

## 2011-11-09 ENCOUNTER — Other Ambulatory Visit: Payer: Self-pay | Admitting: Physician Assistant

## 2011-11-10 NOTE — Telephone Encounter (Signed)
Needs OV.  

## 2011-11-12 ENCOUNTER — Other Ambulatory Visit (INDEPENDENT_AMBULATORY_CARE_PROVIDER_SITE_OTHER): Payer: Medicare Other | Admitting: *Deleted

## 2011-11-12 ENCOUNTER — Ambulatory Visit (INDEPENDENT_AMBULATORY_CARE_PROVIDER_SITE_OTHER): Payer: Medicare Other | Admitting: *Deleted

## 2011-11-12 DIAGNOSIS — I5022 Chronic systolic (congestive) heart failure: Secondary | ICD-10-CM

## 2011-11-12 DIAGNOSIS — I4891 Unspecified atrial fibrillation: Secondary | ICD-10-CM

## 2011-11-12 LAB — BASIC METABOLIC PANEL
BUN: 23 mg/dL (ref 6–23)
Calcium: 8.9 mg/dL (ref 8.4–10.5)
Creatinine, Ser: 1.3 mg/dL (ref 0.4–1.5)
GFR: 56.92 mL/min — ABNORMAL LOW (ref >60.00)

## 2011-11-12 LAB — LIPID PANEL
Cholesterol: 189 mg/dL (ref 0–200)
LDL Cholesterol: 108 mg/dL — ABNORMAL HIGH (ref 0–99)
Triglycerides: 55 mg/dL (ref 0.0–149.0)

## 2011-11-12 LAB — POCT INR: INR: 2.4

## 2011-11-13 ENCOUNTER — Telehealth: Payer: Self-pay

## 2011-11-13 ENCOUNTER — Telehealth: Payer: Self-pay | Admitting: Cardiology

## 2011-11-13 MED ORDER — ZOLPIDEM TARTRATE 10 MG PO TABS: 10.0000 mg | ORAL_TABLET | Freq: Every evening | ORAL | Status: DC | PRN

## 2011-11-13 NOTE — Telephone Encounter (Signed)
[facility]

## 2011-11-13 NOTE — Telephone Encounter (Signed)
Pt's dtr calling re blood work results

## 2011-11-15 ENCOUNTER — Other Ambulatory Visit: Payer: Medicare Other | Admitting: *Deleted

## 2011-11-15 NOTE — Telephone Encounter (Signed)
Talked with daugher, Marcelle Smiling 11/13/11

## 2011-11-18 ENCOUNTER — Ambulatory Visit (INDEPENDENT_AMBULATORY_CARE_PROVIDER_SITE_OTHER): Payer: Medicare Other | Admitting: Family Medicine

## 2011-11-18 VITALS — BP 110/66 | HR 76 | Temp 97.8°F | Resp 20 | Ht 65.5 in | Wt 144.6 lb

## 2011-11-18 DIAGNOSIS — E039 Hypothyroidism, unspecified: Secondary | ICD-10-CM

## 2011-11-18 DIAGNOSIS — G4700 Insomnia, unspecified: Secondary | ICD-10-CM

## 2011-11-18 MED ORDER — TAMSULOSIN HCL 0.4 MG PO CAPS: 0.4000 mg | ORAL_CAPSULE | Freq: Every day | ORAL | Status: DC

## 2011-11-18 MED ORDER — LEVOTHYROXINE SODIUM 100 MCG PO TABS: 100.0000 ug | ORAL_TABLET | Freq: Every day | ORAL | Status: DC

## 2011-11-18 NOTE — Progress Notes (Signed)
Is an 78 year old man from the [place] who comes in with his wife and daughter who translates for him. He needs a refill on his thyroid and sleep medicine. Is having no new problems.  Objective: No acute distress  HEENT: Unremarkable respirations normal  Neuro: Unchanged gait stable cranial nerves III through XII intact  Skin warm and dry  Meds reviewed  Assessment stable  Plan meds refilled

## 2011-11-26 ENCOUNTER — Ambulatory Visit (INDEPENDENT_AMBULATORY_CARE_PROVIDER_SITE_OTHER): Payer: Medicare Other | Admitting: Pharmacist

## 2011-11-26 DIAGNOSIS — I4891 Unspecified atrial fibrillation: Secondary | ICD-10-CM

## 2011-12-06 ENCOUNTER — Encounter: Payer: Self-pay | Admitting: Family Medicine

## 2011-12-06 ENCOUNTER — Ambulatory Visit (INDEPENDENT_AMBULATORY_CARE_PROVIDER_SITE_OTHER): Payer: Medicare Other | Admitting: Family Medicine

## 2011-12-06 VITALS — BP 120/60 | HR 68 | Temp 97.5°F | Resp 16 | Ht 64.5 in | Wt 142.4 lb

## 2011-12-06 DIAGNOSIS — I4891 Unspecified atrial fibrillation: Secondary | ICD-10-CM

## 2011-12-06 DIAGNOSIS — R42 Dizziness and giddiness: Secondary | ICD-10-CM

## 2011-12-06 DIAGNOSIS — N139 Obstructive and reflux uropathy, unspecified: Secondary | ICD-10-CM

## 2011-12-06 DIAGNOSIS — I10 Essential (primary) hypertension: Secondary | ICD-10-CM

## 2011-12-06 NOTE — Progress Notes (Signed)
Is an 78 year old man from [place] who comes in for recheck on dizziness. He's been doing well, walking daily at including one flight of stairs without shortness of breath. He states it occasionally does get a little bit unstable but he has had no falls and he's been working on his endurance. He had no chest pain. He feels that his strength is as good as it's been a long time.  He's been compliant with his medications, has had no edema. Seen with wife and daughter.  Saw Dr. Darvin Neighbours several weeks ago who has been following patient monthly with catheter changes. Family asks if he can have a vacation from his catheter. I explained that it's possible that we could remove his catheter and he might be able to continue voiding but that eventually he would have obstructive symptoms and probably need to have the catheter replaced but nobody could predict when that would be  O:  Alert, tchearful, no acute distress HEENT: Unremarkable  Chest: Clear to auscultation  Heart: 1/6 systolic ejection type murmur  Abdomen: Soft, no bruits, no HSM, no masses,  Genitalia: Foley in place   Assessment: Stable, doing as well as could be expected  Plan: Continue to monitor on a six-month basis

## 2011-12-13 ENCOUNTER — Other Ambulatory Visit: Payer: Self-pay | Admitting: Cardiology

## 2011-12-18 ENCOUNTER — Ambulatory Visit (INDEPENDENT_AMBULATORY_CARE_PROVIDER_SITE_OTHER): Payer: Medicare Other | Admitting: *Deleted

## 2011-12-18 DIAGNOSIS — I4891 Unspecified atrial fibrillation: Secondary | ICD-10-CM

## 2011-12-18 LAB — POCT INR: INR: 2.8

## 2011-12-19 ENCOUNTER — Other Ambulatory Visit: Payer: Self-pay

## 2011-12-19 ENCOUNTER — Other Ambulatory Visit: Payer: Self-pay | Admitting: *Deleted

## 2011-12-19 DIAGNOSIS — I5022 Chronic systolic (congestive) heart failure: Secondary | ICD-10-CM

## 2011-12-19 MED ORDER — KLOR-CON M20 20 MEQ PO TBCR: 20.0000 meq | EXTENDED_RELEASE_TABLET | Freq: Every day | ORAL | Status: DC

## 2011-12-19 MED ORDER — LISINOPRIL 5 MG PO TABS: 5.0000 mg | ORAL_TABLET | Freq: Every day | ORAL | Status: DC

## 2011-12-19 MED ORDER — PRAVASTATIN SODIUM 80 MG PO TABS: 80.0000 mg | ORAL_TABLET | Freq: Every day | ORAL | Status: DC

## 2011-12-19 MED ORDER — METOPROLOL SUCCINATE ER 25 MG PO TB24: 25.0000 mg | ORAL_TABLET | Freq: Every day | ORAL | Status: DC

## 2012-01-04 ENCOUNTER — Other Ambulatory Visit: Payer: Self-pay | Admitting: Family Medicine

## 2012-01-04 MED ORDER — METOPROLOL SUCCINATE ER 25 MG PO TB24: 25.0000 mg | ORAL_TABLET | Freq: Every day | ORAL | Status: DC

## 2012-01-16 ENCOUNTER — Ambulatory Visit (INDEPENDENT_AMBULATORY_CARE_PROVIDER_SITE_OTHER): Payer: Medicare Other | Admitting: Pharmacist

## 2012-01-16 DIAGNOSIS — I4891 Unspecified atrial fibrillation: Secondary | ICD-10-CM

## 2012-01-16 LAB — POCT INR: INR: 2.7

## 2012-01-30 ENCOUNTER — Encounter: Payer: Self-pay | Admitting: Family Medicine

## 2012-01-30 ENCOUNTER — Ambulatory Visit (INDEPENDENT_AMBULATORY_CARE_PROVIDER_SITE_OTHER): Payer: Medicare Other | Admitting: Family Medicine

## 2012-01-30 VITALS — BP 100/50 | HR 60 | Resp 16

## 2012-01-30 DIAGNOSIS — K921 Melena: Secondary | ICD-10-CM

## 2012-01-30 DIAGNOSIS — K6289 Other specified diseases of anus and rectum: Secondary | ICD-10-CM

## 2012-01-30 DIAGNOSIS — K649 Unspecified hemorrhoids: Secondary | ICD-10-CM

## 2012-01-30 DIAGNOSIS — I4891 Unspecified atrial fibrillation: Secondary | ICD-10-CM

## 2012-01-30 DIAGNOSIS — K130 Diseases of lips: Secondary | ICD-10-CM

## 2012-01-30 LAB — IFOBT (OCCULT BLOOD): IFOBT: NEGATIVE

## 2012-01-30 NOTE — Progress Notes (Signed)
78 yo man from [place] with no further dizziness since cutting Lisinopril to 2.5 mg daily.  Today, his complaints are dry lips, decreased appetite, and one episode of blood per rectum  He is walking bid when weather allows  He has h/o cancer phobia  Last colonoscopy was 2006  O:  BP checked:  100/50 sitting, 95/50 standing with pulse about 60 and irregular Heart: I/VI systolic murmur, irreg Chest:  Clear Abdomen:  Soft nontender, no HSM or mass Rectal:  No prostated nodules, no masses.  Skin is reddened suggesting monilia infection.  Small posterior hemorrhoid  Results for orders placed in visit on 01/30/12  IFOBT (OCCULT BLOOD)      Component Value Range   IFOBT Negative     A:  Stable.  Low blood pressure without dizziness.  Hemorrhoidal bleeding  P:  Discontinue the lisinopril altogether Diflucan x 1

## 2012-02-07 ENCOUNTER — Encounter: Payer: Self-pay | Admitting: Cardiology

## 2012-02-07 ENCOUNTER — Ambulatory Visit (INDEPENDENT_AMBULATORY_CARE_PROVIDER_SITE_OTHER): Payer: Medicare Other

## 2012-02-07 ENCOUNTER — Ambulatory Visit (INDEPENDENT_AMBULATORY_CARE_PROVIDER_SITE_OTHER): Payer: Medicare Other | Admitting: Cardiology

## 2012-02-07 ENCOUNTER — Telehealth: Payer: Self-pay | Admitting: Cardiology

## 2012-02-07 VITALS — BP 114/58 | HR 72 | Ht 65.0 in | Wt 141.1 lb

## 2012-02-07 DIAGNOSIS — R0602 Shortness of breath: Secondary | ICD-10-CM

## 2012-02-07 DIAGNOSIS — I5022 Chronic systolic (congestive) heart failure: Secondary | ICD-10-CM

## 2012-02-07 DIAGNOSIS — I4891 Unspecified atrial fibrillation: Secondary | ICD-10-CM

## 2012-02-07 DIAGNOSIS — I080 Rheumatic disorders of both mitral and aortic valves: Secondary | ICD-10-CM

## 2012-02-07 DIAGNOSIS — I2510 Atherosclerotic heart disease of native coronary artery without angina pectoris: Secondary | ICD-10-CM

## 2012-02-07 DIAGNOSIS — E785 Hyperlipidemia, unspecified: Secondary | ICD-10-CM

## 2012-02-07 LAB — HEPATIC FUNCTION PANEL
AST: 26 U/L (ref 0–37)
Albumin: 3.7 g/dL (ref 3.5–5.2)
Total Protein: 7.1 g/dL (ref 6.0–8.3)

## 2012-02-07 LAB — BASIC METABOLIC PANEL
BUN: 21 mg/dL (ref 6–23)
Calcium: 8.9 mg/dL (ref 8.4–10.5)
GFR: 52.6 mL/min — ABNORMAL LOW (ref >60.00)
Glucose, Bld: 127 mg/dL — ABNORMAL HIGH (ref 70–99)
Potassium: 3.5 mEq/L (ref 3.5–5.1)

## 2012-02-07 LAB — POCT INR: INR: 3.4

## 2012-02-07 LAB — TSH: TSH: 3.28 u[IU]/mL (ref 0.35–5.50)

## 2012-02-07 MED ORDER — LISINOPRIL 2.5 MG PO TABS: 2.5000 mg | ORAL_TABLET | Freq: Every day | ORAL | Status: DC

## 2012-02-07 NOTE — Patient Instructions (Signed)
Start lisinopril 2.5mg  daily.  Your physician recommends that you have lab work today--BMET/BNP/TSH/Liver profile 414.01  428.22  Your physician wants you to follow-up in: 4 months with Dr Shirlee Latch. (September 2013).You will receive a reminder letter in the mail two months in advance. If you don't receive a letter, please call our office to schedule the follow-up appointment.

## 2012-02-07 NOTE — Telephone Encounter (Signed)
Spoke with pt's daughter about lab done today.

## 2012-02-07 NOTE — Telephone Encounter (Signed)
New msg Pt's daughter called back about lab results

## 2012-02-08 NOTE — Assessment & Plan Note (Signed)
Maintaining NSR on amiodarone at low dose.  Continue amiodarone, Toprol XL, warfarin.  Will check LFTs/TSH.  Needs yearly eye exam while on amiodarone.

## 2012-02-08 NOTE — Assessment & Plan Note (Signed)
EF 30-35% on last echo, seems to be NYHA class II symptomatically.  Not volume overloaded on exam.  Will have him continue the current doses of Lasix and Toprol XL.   I will have him go back on lisinopril 2.5 mg daily.  I think that he will be able to tolerate this low dose.  Would not place ICD given age of 57. Check BMET, BNP today.

## 2012-02-08 NOTE — Progress Notes (Signed)
PCP: Dr. Milus Glazier  78 yo with history of CAD, ischemic CMP, CKD, and paroxysmal atrial fibrillation presents for cardiology followup. He speaks little [place] and his daughter interprets. He has been doing fairly well symptomatically from a cardiac standpoint. He has felt no sustained atrial fibrillation and is in NSR today. He will occasionally feel some mild fluttering in his chest.  No chest pain.  He walks about 3 miles a day and can climb a flight of steps with no exertional dyspnea.  Main recent problem has been "dizziness."  He was on lisinopril 5 mg daily but SBP was in the 90s-100s and he complained of dizziness, so lisinopril was initially cut back to 2.5 mg daily then stopped altogether.  He saw his PCP on 5/8 while taking lisinopril 2.5 mg daily.  He was not orthostatic though SBP was in the high 90s/low 100s.  Lisinopril was stopped that day. His "dizziness" is not always orthostatic-type lightheadedness with standing.  It seems to depend on the position of his head (if he lies back it occurs) and I wonder if there is not a significant component of vertigo.  Patient has a history of myalgias with Crestor and Lipitor.  He has tolerated pravastatin without myalgias so far.   ECG: NSR,  Incomplete LBBB, inferior T wave flattening  Labs (9/11): K 4.1, creatinine 1.4  Labs (8/12): K 3.8, creatinine 1.3, TSH normal, LFTs normal, LDL 96, HDL 50 Labs (1/13): K 3.9, creatinine 1.3, TSH normal, LFTs normal Labs (2/13): K 3.8, creatinine 1.3  Allergies (verified):  1) ! Crestor  2) Vytorin  3) Atenolol   Past Medical History:  1. Coronary artery disease: [facility] 4/06 with 60% pLAD, 40% ramus, 60% mRCA, and 90% mPDA. No intervention. Inferior akinesis on LV-gram was suggestive of possible inferior MI with recanalization.  2. Hyperlipidemia: Myalgias with Lipitor and Crestor.  3. Hypothyroidism  4. Peripheral vascular disease  5. Anxiety  6. Skin cancer, hx of 2008 BCC  7. Colonic polyps, hx of    8. Paroxysmal atrial fibrillation with rapid ventricular response treated with amiodarone therapy converting to normal sinus rhythm 02/2009  9. Systolic CHF: Probable ischemic cardiomyopathy. Echo (6/10) with EF 40%, mildly dilated LV, moderate MR.  Echo (3/12): EF 30-35%, inferolateral severe hypokinesis, trivial AI, moderate to severe MR.  10. History of PNA  11. CKD. Baseline creatinine around 1.4.  12. Mitral regurgitation: Moderate by echo 6/10. 2-3+ by cath 2006.  Moderate-severe by echo in 3/12.  13. PVCs   Family History:  Family History of CAD Male 1st degree relative <60   Social History:  Retired  Originally from [place], speaks little English  Married  Former Smoker   Review of Systems:  All systems reviewed and negative except as per HPI.   Current Outpatient Prescriptions  Medication Sig Dispense Refill  . amiodarone (PACERONE) 200 MG tablet TAKE 1/2 TABLET BY MOUTH DAILY  30 tablet  6  . co-enzyme Q-10 30 MG capsule Take 200 mg by mouth daily.       . furosemide (LASIX) 40 MG tablet Take 40 mg by mouth daily.       Marland Kitchen KLOR-CON M20 20 MEQ tablet Take 1 tablet (20 mEq total) by mouth daily.  90 tablet  1  . levothyroxine (SYNTHROID, LEVOTHROID) 100 MCG tablet Take 1 tablet (100 mcg total) by mouth daily.  90 tablet  3  . metoprolol succinate (TOPROL-XL) 25 MG 24 hr tablet Take 1 tablet (25 mg total)  by mouth daily.  90 tablet  3  . Omega-3 Fatty Acids (FISH OIL) 1200 MG CAPS Take by mouth 2 (two) times daily.        . pravastatin (PRAVACHOL) 80 MG tablet Take 1 tablet (80 mg total) by mouth daily.  90 tablet  1  . Tamsulosin HCl (FLOMAX) 0.4 MG CAPS Take 1 capsule (0.4 mg total) by mouth daily.  90 capsule  3  . warfarin (COUMADIN) 1 MG tablet Take as directed by Anticoagulation clinic  60 tablet  3  . zolpidem (AMBIEN) 10 MG tablet Take 1 tablet (10 mg total) by mouth at bedtime as needed for sleep.  30 tablet  0  . lisinopril (PRINIVIL,ZESTRIL) 2.5 MG tablet Take 1  tablet (2.5 mg total) by mouth daily.  30 tablet  6    BP 114/58  Pulse 72  Ht 5\' 5"  (1.651 m)  Wt 141 lb 1.9 oz (64.012 kg)  BMI 23.48 kg/m2 General: NAD Neck: No JVD, no thyromegaly or thyroid nodule.  Lungs: Clear to auscultation bilaterally with normal respiratory effort. CV: Nondisplaced PMI.  Heart regular S1/S2, no S3/S4, 2/6 HSM at apex.  Trace ankle edema.  No carotid bruit.  Normal pedal pulses.  Abdomen: Soft, nontender, no hepatosplenomegaly, no distention.  Neurologic: Alert and oriented x 3.  Psych: Normal affect. Extremities: No clubbing or cyanosis.

## 2012-02-08 NOTE — Assessment & Plan Note (Signed)
LDL better on pravastatin 80 mg daily.  He is tolerating pravastatin without myalgias.  Had myalgias with Lipitor and Crestor.  Amiodarone use precludes more than very low dose simvastatin.  LDL was above goal of < 70 when last checked.  For now, will have him work on diet/exercise.

## 2012-02-08 NOTE — Assessment & Plan Note (Signed)
Last echo showed moderate to severe MR.  EF is 30-35%.  He has NYHA class II symptoms it seems and is not volume overloaded on exam.  Given age and medical condition, would plan medical management.

## 2012-02-17 ENCOUNTER — Other Ambulatory Visit: Payer: Self-pay | Admitting: Cardiology

## 2012-02-21 ENCOUNTER — Ambulatory Visit (INDEPENDENT_AMBULATORY_CARE_PROVIDER_SITE_OTHER): Payer: Medicare Other | Admitting: *Deleted

## 2012-02-21 DIAGNOSIS — I4891 Unspecified atrial fibrillation: Secondary | ICD-10-CM

## 2012-02-21 LAB — POCT INR: INR: 3

## 2012-03-13 ENCOUNTER — Ambulatory Visit (INDEPENDENT_AMBULATORY_CARE_PROVIDER_SITE_OTHER): Payer: Medicare Other

## 2012-03-13 DIAGNOSIS — I4891 Unspecified atrial fibrillation: Secondary | ICD-10-CM

## 2012-03-13 MED ORDER — WARFARIN SODIUM 1 MG PO TABS: ORAL_TABLET | ORAL | Status: DC

## 2012-03-21 ENCOUNTER — Ambulatory Visit (INDEPENDENT_AMBULATORY_CARE_PROVIDER_SITE_OTHER): Payer: Medicare Other | Admitting: Family Medicine

## 2012-03-21 VITALS — BP 100/60 | HR 72 | Temp 97.5°F | Resp 17 | Ht 65.0 in | Wt 140.0 lb

## 2012-03-21 DIAGNOSIS — H1030 Unspecified acute conjunctivitis, unspecified eye: Secondary | ICD-10-CM

## 2012-03-21 DIAGNOSIS — L0201 Cutaneous abscess of face: Secondary | ICD-10-CM

## 2012-03-21 DIAGNOSIS — H109 Unspecified conjunctivitis: Secondary | ICD-10-CM

## 2012-03-21 MED ORDER — MUPIROCIN 2 % EX OINT: TOPICAL_OINTMENT | Freq: Three times a day (TID) | CUTANEOUS | Status: AC

## 2012-03-21 MED ORDER — TOBRAMYCIN 0.3 % OP SOLN: 1.0000 [drp] | OPHTHALMIC | Status: AC

## 2012-03-21 NOTE — Patient Instructions (Signed)
???????????? ???????????? (  Bacterial Conjunctivitis) ???????????? - ??? ??????????? (??????????) ?????????? ????????? ????????, ??????? ????????? ????? ?????. ??? ???????? ???????? ????????????. ?????????? ????? ????? ???????????????? ?? ?????????? ??????????? ????. ????????? ??-?? ?????????? ?????? ????? ???????? ?????????? ??????? ??? ???????, ???????????? ?????? ???????? ??????? ????? ???????   ?????????? ????? ????? ?????????? ????????? ???????? (????????) ?? ????????? ???????? ?????   ?????????? ????? ????? ?????????? ??????????? ????? ??? ???? ?????, ???????? ????????? ???? ??? ????????.   ????????? ???????? ????? ???? ???????? ????? ?????????? ????????? ??? ?????????? ?? ?????????? ??????? ?????.   ?????? ??????????? ????.   ?????????? ????????? ????????????.  ???????? ??????? ??? ??????? ??????? ????? ????? ??? ?????????? ?????? ???? ???????? ????????? ?????????? ?????????????. ???? ????? ???????? ??? ??????. ?? ????? ????? ?????????? ?????????? ??? ??????????? ?????. ???? ??????????? ??????????? ???????????????? ? ?????, ?????? ?????????? ????????. ??????? ??????? ???????? ?? ????????? ??????????? ???????????? ?????. ??????? ????????? ????????? ???????? ?????, ????? ????? ?????????? ??? ???????????. ????? ????????? ?? ????? ?????????? ? ??????? ?????? ??????? (??????????? ??????? ???????). ??????? ?????????? ? ???????????, ????? ????? ?????????? ??????? ??????????: ????????????? ??? ????????.  ??????? ??? ??????? ????????????? ????????????? ??????????? ?????????, ??????? ?????????? ???????? (???????????). ??????? ??????????? ? ???? ??????? ??????, ?? ????? ????? ???????????? ???? ? ?????????????, ??????? ?????? ???????????????? ??? ????????? ?????????. ? ????????? ??????? ??????????? ????? ??????????? ????????????. ????????????? ????? ??? ??????? ????? ??????? ????? ??????????. ?????????? ?? ????? ? ???????? ????????  ??? ?????? ??????????? ????????? ?????????? ?????? ???????  ????????? ? ??????????? ????? ?? 10-20 ????? 3-4 ???? ? ????.   ????? ???????? ????? ????????? ?? ????? ???????? ??????????.   ????? ????? ???? ?????, ????????? ??????????? ????????? ???????????? ??????????.   ? ??????? ?????? ???? ?????? ???? ?????????. ????????????? ????? ????????? ????? ?????????????? ??????????????? ????????.   ??????? ??? ???????? ????????? ?????????.   ?? ??????????? ????????? ??? ????, ???? ?????????? ????????? ?? ????????.   ??????????????: ?? ?????????? ????????????? ???????????? ??? ???????????, ???? ?????? ????????.   ?? ?????? ?????????? ????? ?? ??????? ???????????? ???????????. ??????? ? ?????? ?????, ??? ????????? ????????????? ???? ???????? ????? ????? ????????? ??????????????.   ?? ????????? ???? ???????? ????????? ??? ?????? ??? ????????? ???? ??? ??????? ??????? ??????. ??? ???? ???????? ???????? ??????????????? ???????? ?? ?????? ????, ? ????? ????? ?????????????? ????????? ?????? ?????? ?????. ???????? ????????????? ?????? ?????.  ?????????? ?????????? ? ?????, ????:  ???? ??-???????? ??????? ????? 3 ??? ????? ?????? ???????.   ????????? ?????? ????????? ?? ?????.   ???? ? ????? ???????????.   ??????????? ?????????????.   ???????? ???????? ??????.   ??????????? ?????????? ???? 102 F (38,9 C).   ? ??? ???? ? ????, ??????????? ??? ????.   ? ??? ??????????? ?????-???? ?? ????????? ???????? ???????? ????????????? ??????????, ??????? ?? ??????????.  ?? ??????:  ?? ????????? ?????? ?????????? ?? ????? ????? ???????   ?????? ?????????????? ????????? ????? ??????? ?? ????? ??????????   ?????????? ?????????? ?? ??????????? ??????? ???????? ????????????  Document Released: 09/10/2005 Document Revised: 08/30/2011 [facility] Patient Information 2012 [place], [place].

## 2012-03-21 NOTE — Progress Notes (Signed)
78 yo man with 1 week red left eye and irritation.  He has some discharge.  Symptoms fluctuate.  Hope Danella Deis biopsied the area 2 years ago and was neg.  O:  Small excrescence left lower mid lid with exudate and mild injection EOM normal  A:  Conjunctivitis with small skin tag  P:  Try the tobrex first with opth consult If no better, may need plastic surgery to remove the offending irritating tag.  Also has 3 days of small irritation under right nostril  O:  Red swollen 3 mm area at right nostril frenulum  A:  Mild staph cellulitis  P: 1. Conjunctivitis  tobramycin (TOBREX) 0.3 % ophthalmic solution, Ambulatory referral to Ophthalmology  2. Cellulitis and abscess of face  mupirocin ointment (BACTROBAN) 2 %

## 2012-03-25 ENCOUNTER — Emergency Department (HOSPITAL_COMMUNITY)
Admission: EM | Admit: 2012-03-25 | Discharge: 2012-03-25 | Disposition: A | Discharge status: Home / Self Care | Payer: Medicare Other | Attending: Emergency Medicine | Admitting: Emergency Medicine

## 2012-03-25 ENCOUNTER — Encounter (HOSPITAL_COMMUNITY): Payer: Self-pay | Admitting: *Deleted

## 2012-03-25 DIAGNOSIS — I739 Peripheral vascular disease, unspecified: Secondary | ICD-10-CM | POA: Insufficient documentation

## 2012-03-25 DIAGNOSIS — I2510 Atherosclerotic heart disease of native coronary artery without angina pectoris: Secondary | ICD-10-CM | POA: Insufficient documentation

## 2012-03-25 DIAGNOSIS — E785 Hyperlipidemia, unspecified: Secondary | ICD-10-CM | POA: Insufficient documentation

## 2012-03-25 DIAGNOSIS — N189 Chronic kidney disease, unspecified: Secondary | ICD-10-CM | POA: Insufficient documentation

## 2012-03-25 DIAGNOSIS — Z7901 Long term (current) use of anticoagulants: Secondary | ICD-10-CM | POA: Insufficient documentation

## 2012-03-25 DIAGNOSIS — R319 Hematuria, unspecified: Secondary | ICD-10-CM | POA: Insufficient documentation

## 2012-03-25 DIAGNOSIS — Z79899 Other long term (current) drug therapy: Secondary | ICD-10-CM | POA: Insufficient documentation

## 2012-03-25 DIAGNOSIS — E039 Hypothyroidism, unspecified: Secondary | ICD-10-CM | POA: Insufficient documentation

## 2012-03-25 LAB — URINE MICROSCOPIC-ADD ON

## 2012-03-25 LAB — CBC WITH DIFFERENTIAL/PLATELET
Basophils Absolute: 0 10*3/uL (ref 0.0–0.1)
Basophils Relative: 0 % (ref 0–1)
Eosinophils Absolute: 0 10*3/uL (ref 0.0–0.7)
Eosinophils Relative: 0 % (ref 0–5)
HCT: 35.1 % — ABNORMAL LOW (ref 39.0–52.0)
Lymphocytes Relative: 17 % (ref 12–46)
MCH: 30.7 pg (ref 26.0–34.0)
MCHC: 34.2 g/dL (ref 30.0–36.0)
MCV: 89.8 fL (ref 78.0–100.0)
Monocytes Absolute: 0.5 10*3/uL (ref 0.1–1.0)
Platelets: 130 10*3/uL — ABNORMAL LOW (ref 150–400)
RDW: 14.2 % (ref 11.5–15.5)

## 2012-03-25 LAB — URINALYSIS, ROUTINE W REFLEX MICROSCOPIC
Ketones, ur: 15 mg/dL — AB
Nitrite: POSITIVE — AB
Specific Gravity, Urine: 1.027 (ref 1.005–1.030)
pH: 5 (ref 5.0–8.0)

## 2012-03-25 NOTE — ED Provider Notes (Signed)
History     CSN: [id number]  Arrival date & time 03/25/12  8469   First MD Initiated Contact with Patient 03/25/12 0740      Chief Complaint  Patient presents with  . Hematuria    (Consider location/radiation/quality/duration/timing/severity/associated sxs/prior treatment) HPI 78 yo M with history of CKD with indwelling foley and history of hematuria that developed this morning.  Patient denies any trauma overnight.  He denies fevers or change in urinary output.  He denies any abdominal pain, chest pain, or shortness of breath.  Patient is on coumadin for atrial fibrillation.  Patient was hemodynamically stable on presentation.    Past Medical History  Diagnosis Date  . CAD (coronary artery disease)     [facility] 4/06 with 60% pLAD, 40% ramus, 60% mRCA, and 90% mPDA. No intervention. Inferior akinesis on LV-gram was suggestive of possible inferoir MI with recanalization.   . Hyperlipidemia     Myalgias with Lipitor and Crestor   . Hypothyroidism   . Peripheral vascular disease   . Anxiety   . Skin cancer     Hx of 2008 BCC  . Hx of colonic polyps   . Paroxysmal atrial fibrillation 02/2009     with rapid ventricular response treated with amiodarone therapy converting to normal sinus rhythm  . Systolic CHF     a.  Probable ischemic cardiomyopathy. Echo (6/10) with EF 40%, mildly dilated LV, moderate MR. ;  b.  echo 3/12: mild LVH, EF 30-35%, IL HK and Lat HK, mod to severe MR, trivial AI, mod LAE  . PNA (pneumonia)     History of   . CKD (chronic kidney disease)     Baseline creatinine around 1.4  . Mitral regurgitation     Moderate by echo 6/10. 2-3+ by cath 2006; mod to severe by echo 3/12  . PVC's (premature ventricular contractions)     Past Surgical History  Procedure Date  . Skin cancer excision 2008    shoulder and nose     Family History  Problem Relation Age of Onset  . Coronary artery disease Other     male, 1st degree relative <60     History  Substance Use  Topics  . Smoking status: Never Smoker   . Smokeless tobacco: Not on file  . Alcohol Use: No      Review of Systems  Constitutional: Negative.   HENT: Negative.   Eyes: Negative.   Respiratory: Negative.   Cardiovascular: Negative.   Gastrointestinal: Negative.   Genitourinary: Positive for hematuria.  Musculoskeletal: Negative.   Skin: Negative.   Neurological: Negative.   Hematological: Negative.   Psychiatric/Behavioral: Negative.   All other systems reviewed and are negative.    Allergies  Atenolol; Ezetimibe-simvastatin; and Rosuvastatin  Home Medications   Current Outpatient Rx  Name Route Sig Dispense Refill  . AMIODARONE HCL 200 MG PO TABS  TAKE 1/2 TABLET BY MOUTH DAILY 30 tablet 6  . COENZYME Q10 30 MG PO CAPS Oral Take 200 mg by mouth daily.     . FUROSEMIDE 40 MG PO TABS Oral Take 40 mg by mouth daily.     Marland Kitchen KLOR-CON M20 20 MEQ PO TBCR Oral Take 1 tablet (20 mEq total) by mouth daily. 90 tablet 1    Dispense as written.  Marland Kitchen LEVOTHYROXINE SODIUM 100 MCG PO TABS Oral Take 1 tablet (100 mcg total) by mouth daily. 90 tablet 3  . LISINOPRIL 2.5 MG PO TABS Oral Take 1 tablet (2.5  mg total) by mouth daily. 30 tablet 6  . METOPROLOL SUCCINATE ER 25 MG PO TB24 Oral Take 1 tablet (25 mg total) by mouth daily. 90 tablet 3  . MUPIROCIN 2 % EX OINT Topical Apply topically 3 (three) times daily. 22 g 0  . FISH OIL 1200 MG PO CAPS Oral Take by mouth 2 (two) times daily.      Marland Kitchen PRAVASTATIN SODIUM 80 MG PO TABS Oral Take 1 tablet (80 mg total) by mouth daily. 90 tablet 1  . TAMSULOSIN HCL 0.4 MG PO CAPS Oral Take 1 capsule (0.4 mg total) by mouth daily. 90 capsule 3  . TOBRAMYCIN SULFATE 0.3 % OP SOLN Left Eye Place 1 drop into the left eye every 4 (four) hours. 5 mL 0  . WARFARIN SODIUM 1 MG PO TABS  Take as directed by anticoagulation clinic 60 tablet 3  . ZOLPIDEM TARTRATE 10 MG PO TABS Oral Take 1 tablet (10 mg total) by mouth at bedtime as needed for sleep. 30 tablet 0     BP 120/65  Pulse 73  Temp 97.9 F (36.6 C) (Oral)  Resp 22  SpO2 94%  Physical Exam  Nursing note and vitals reviewed. GEN: Well-developed, well-nourished male in no distress HEENT: Atraumatic, normocephalic.  EYES: PERRLA BL, no scleral icterus. NECK: Trachea midline, no meningismus CV: regular rate and rhythm. No murmurs, rubs, or gallops PULM: No respiratory distress.  No crackles, wheezes, or rales. GI: soft, non-tender. No guarding, rebound, or tenderness. + bowel sounds  GU: circumcised male foley in place, no blood at the meatus, no testicular swelling or TTP, foley bag with  Neuro: cranial nerves grossly 2-12 intact, no abnormalities of strength or sensation, A and O x 3 MSK: Patient moves all 4 extremities symmetrically, no deformity, edema, or injury noted Skin: No rashes petechiae, purpura, or jaundice Psych: no abnormality of mood   ED Course  Procedures (including critical care time)  Labs Reviewed  URINALYSIS, ROUTINE W REFLEX MICROSCOPIC - Abnormal; Notable for the following:    Color, Urine RED (*)  BIOCHEMICALS MAY BE AFFECTED BY COLOR   APPearance CLOUDY (*)     Hgb urine dipstick LARGE (*)     Bilirubin Urine MODERATE (*)     Ketones, ur 15 (*)     Protein, ur >300 (*)     Nitrite POSITIVE (*)     Leukocytes, UA MODERATE (*)     All other components within normal limits  PROTIME-INR - Abnormal; Notable for the following:    Prothrombin Time 30.9 (*)     INR 2.91 (*)     All other components within normal limits  CBC WITH DIFFERENTIAL - Abnormal; Notable for the following:    RBC 3.91 (*)     Hemoglobin 12.0 (*)     HCT 35.1 (*)     Platelets 130 (*)     All other components within normal limits  URINE MICROSCOPIC-ADD ON - Abnormal; Notable for the following:    Bacteria, UA MANY (*)     Casts GRANULAR CAST (*)     All other components within normal limits  URINE CULTURE   No results found.   1. Hematuria       MDM  Patient was  seen by myself. He was without pain and with acute hematuria.  Patient was on coumadin and INR and CBC were checked.  He denied any nausea or vomiting.  Labs were unremarkable and hematuria resolved while  in the ED.  Patient did have urine somewhat concerning for UTI but he had no symptoms of this, was afebrile, had no leukocytosis, and sample was drawn from a chronic catheter.  Patient had urine culture sent.  He can follow-up with his urologist as needed.  Patient was discharged in good condition.Cyndra Numbers, MD 03/26/12 1242

## 2012-03-25 NOTE — ED Notes (Signed)
Patient woke with blood in his urine.  Patient has foley catheter due to bladder problems.  Patient is on coumadin.  Patient info received by daughter.  Patient speaks [place]

## 2012-03-27 LAB — URINE CULTURE: Colony Count: >=100000

## 2012-03-28 NOTE — ED Notes (Signed)
+   Urine Chart sent to EDP office for review. 

## 2012-04-01 ENCOUNTER — Telehealth: Payer: Self-pay

## 2012-04-01 NOTE — Telephone Encounter (Signed)
Faxed over the most recent ov to [facility] per request below.

## 2012-04-01 NOTE — Telephone Encounter (Signed)
ASHLEY FROM [facility] WOULD LIKE TO HAVE PTS RECENT OV NOTES THAT ARE PERTAINING TO THE REFFERAL THAT WAS MADE FOR THE PT. PT IS IN THE OFFICE NOW. [phone] [phone]

## 2012-04-02 NOTE — ED Notes (Signed)
Rx for Keflex 500 mg QID x 5 days #20 need to be called to pharmacy Per Marlon Pel.

## 2012-04-04 NOTE — ED Notes (Signed)
Attempt made to contact patient-no answer 

## 2012-04-07 NOTE — ED Notes (Signed)
Daughter refused rx-states patient is better.

## 2012-04-10 ENCOUNTER — Ambulatory Visit (INDEPENDENT_AMBULATORY_CARE_PROVIDER_SITE_OTHER): Payer: Medicare Other | Admitting: Pharmacist

## 2012-04-10 DIAGNOSIS — I4891 Unspecified atrial fibrillation: Secondary | ICD-10-CM

## 2012-04-10 LAB — POCT INR: INR: 2.4

## 2012-04-10 MED ORDER — WARFARIN SODIUM 1 MG PO TABS: ORAL_TABLET | ORAL | Status: DC

## 2012-04-23 ENCOUNTER — Ambulatory Visit (INDEPENDENT_AMBULATORY_CARE_PROVIDER_SITE_OTHER): Payer: Medicare Other | Admitting: Family Medicine

## 2012-04-23 ENCOUNTER — Encounter: Payer: Self-pay | Admitting: Family Medicine

## 2012-04-23 VITALS — BP 94/50 | HR 55 | Temp 97.4°F | Resp 18

## 2012-04-23 DIAGNOSIS — H1045 Other chronic allergic conjunctivitis: Secondary | ICD-10-CM

## 2012-04-23 DIAGNOSIS — H1010 Acute atopic conjunctivitis, unspecified eye: Secondary | ICD-10-CM

## 2012-04-23 DIAGNOSIS — Z9109 Other allergy status, other than to drugs and biological substances: Secondary | ICD-10-CM

## 2012-04-23 DIAGNOSIS — J309 Allergic rhinitis, unspecified: Secondary | ICD-10-CM

## 2012-04-23 MED ORDER — OLOPATADINE HCL 0.1 % OP SOLN: 1.0000 [drp] | Freq: Two times a day (BID) | OPHTHALMIC | Status: DC | PRN

## 2012-04-23 NOTE — Progress Notes (Signed)
@[email or web address] @   Patient ID: [name] MRN: [id number], DOB: December 01, 1925, 78 y.o. Date of Encounter: 04/23/2012, 12:33 PM  Primary Physician: Elvina Sidle, MD  Chief Complaint: No chief complaint on file.   HPI: 78 y.o. year old male presents with a many month history of nasal congestion, post nasal drip,and cough. Mild sinus pressure. Afebrile. No chills. Nasal congestion thick and green/yellow. Cough is productive of clear sputum and not associated with worse symptoms in the morning. He has a cat at home, and has eye itching as well.  In addition, he has a many year h/o left lower lid lesion which has not changed.  No GI complaints. Appetite unchanged  No sick contacts, recent antibiotics, or recent travels.   No leg trauma, sedentary periods, h/o cancer, or tobacco use.  Past Medical History  Diagnosis Date  . CAD (coronary artery disease)     [facility] 4/06 with 60% pLAD, 40% ramus, 60% mRCA, and 90% mPDA. No intervention. Inferior akinesis on LV-gram was suggestive of possible inferoir MI with recanalization.   . Hyperlipidemia     Myalgias with Lipitor and Crestor   . Hypothyroidism   . Peripheral vascular disease   . Anxiety   . Skin cancer     Hx of 2008 BCC  . Hx of colonic polyps   . Paroxysmal atrial fibrillation 02/2009     with rapid ventricular response treated with amiodarone therapy converting to normal sinus rhythm  . Systolic CHF     a.  Probable ischemic cardiomyopathy. Echo (6/10) with EF 40%, mildly dilated LV, moderate MR. ;  b.  echo 3/12: mild LVH, EF 30-35%, IL HK and Lat HK, mod to severe MR, trivial AI, mod LAE  . PNA (pneumonia)     History of   . CKD (chronic kidney disease)     Baseline creatinine around 1.4  . Mitral regurgitation     Moderate by echo 6/10. 2-3+ by cath 2006; mod to severe by echo 3/12  . PVC's (premature ventricular contractions)      Home Meds: Prior to Admission medications   Medication Sig Start Date End Date Taking?  Authorizing Provider  amiodarone (PACERONE) 200 MG tablet TAKE 1/2 TABLET BY MOUTH DAILY 07/14/11   Laurey Morale, MD  co-enzyme Q-10 30 MG capsule Take 200 mg by mouth daily.     Historical Provider, MD  furosemide (LASIX) 40 MG tablet Take 40 mg by mouth daily.     Historical Provider, MD  KLOR-CON M20 20 MEQ tablet Take 1 tablet (20 mEq total) by mouth daily. 12/19/11   Laurey Morale, MD  levothyroxine (SYNTHROID, LEVOTHROID) 100 MCG tablet Take 1 tablet (100 mcg total) by mouth daily. 11/18/11   Elvina Sidle, MD  lisinopril (PRINIVIL,ZESTRIL) 2.5 MG tablet Take 1 tablet (2.5 mg total) by mouth daily. 02/07/12 02/06/13  Laurey Morale, MD  metoprolol succinate (TOPROL-XL) 25 MG 24 hr tablet Take 1 tablet (25 mg total) by mouth daily. 01/04/12   Elvina Sidle, MD  Omega-3 Fatty Acids (FISH OIL) 1200 MG CAPS Take by mouth 2 (two) times daily.      Historical Provider, MD  pravastatin (PRAVACHOL) 80 MG tablet Take 1 tablet (80 mg total) by mouth daily. 12/19/11   Laurey Morale, MD  Tamsulosin HCl (FLOMAX) 0.4 MG CAPS Take 1 capsule (0.4 mg total) by mouth daily. 11/18/11   Elvina Sidle, MD  warfarin (COUMADIN) 1 MG tablet Use as directed by anticoagulation clinic. 04/10/12  Laurey Morale, MD  zolpidem (AMBIEN) 10 MG tablet Take 1 tablet (10 mg total) by mouth at bedtime as needed for sleep. 11/13/11 03/08/12  Morrell Riddle, PA-C  zolpidem (AMBIEN) 10 MG tablet Take 10 mg by mouth at bedtime as needed. For sleep    Historical Provider, MD    Allergies:  Allergies  Allergen Reactions  . Atenolol     REACTION: brady  . Ezetimibe-Simvastatin     REACTION: spasms  . Rosuvastatin     History   Social History  . Marital Status: Married    Spouse Name: N/A    Number of Children: N/A  . Years of Education: N/A   Occupational History  . Not on file.   Social History Main Topics  . Smoking status: Never Smoker   . Smokeless tobacco: Not on file  . Alcohol Use: No  . Drug Use: No   . Sexually Active: Not on file   Other Topics Concern  . Not on file   Social History Narrative   Retired. Originally from [place], speaks little [place]. Married. Daughter- Janetta Hora, her cell number is [phone]      Review of Systems: Constitutional: negative for chills, fever, night sweats or weight changes Cardiovascular: negative for chest pain or palpitations Respiratory: negative for hemoptysis, wheezing, or shortness of breath Abdominal: negative for abdominal pain, nausea, vomiting or diarrhea Dermatological: negative for rash Neurologic: negative for headache   Physical Exam: Blood pressure 94/50, pulse 55, temperature 97.4 F (36.3 C), temperature source Oral, resp. rate 18, SpO2 97.00%., There is no height or weight on file to calculate BMI. General: Well developed, well nourished, in no acute distress. Head: Normocephalic, atraumatic, eyes without discharge, sclera non-icteric, nares are congested. 1 mm yellow crusty papule mid left lower lid. Bilateral auditory canals clear, TM's are without perforation, pearly grey with reflective cone of light bilaterally. No sinus TTP. Oral cavity moist, dentition normal. Posterior pharynx with post nasal drip and mild erythema. No peritonsillar abscess or tonsillar exudate. Neck: Supple. No thyromegaly. Full ROM. No lymphadenopathy. Lungs: Coarse breath sounds bilaterally without wheezes, rales, or rhonchi. Breathing is unlabored.  Heart: RRR with S1 S2. No murmurs, rubs, or gallops appreciated. Msk:  Strength and tone normal for age. Extremities: No clubbing or cyanosis. No edema. Neuro: Alert and oriented X 3. Moves all extremities spontaneously. CNII-XII grossly in tact. Psych:  Responds to questions appropriately with a normal affect.     ASSESSMENT AND PLAN:  78 y.o. year old male with allergies, possibly secondary to the cat 1. Allergic conjunctivitis  olopatadine (PATANOL) 0.1 % ophthalmic solution  Reassured there  is no evidence of heart or lung problem  -Tylenol/Motrin prn -Rest/fluids -RTC precautions -RTC 3-5 days if no improvement  Signed, Elvina Sidle, MD 04/23/2012 12:33 PM

## 2012-05-22 ENCOUNTER — Ambulatory Visit (INDEPENDENT_AMBULATORY_CARE_PROVIDER_SITE_OTHER): Payer: Medicare Other | Admitting: Pharmacist

## 2012-05-22 DIAGNOSIS — I4891 Unspecified atrial fibrillation: Secondary | ICD-10-CM

## 2012-05-22 LAB — POCT INR: INR: 3.5

## 2012-05-25 ENCOUNTER — Ambulatory Visit (INDEPENDENT_AMBULATORY_CARE_PROVIDER_SITE_OTHER): Payer: Medicare Other | Admitting: Family Medicine

## 2012-05-25 ENCOUNTER — Ambulatory Visit: Payer: Medicare Other

## 2012-05-25 VITALS — BP 112/62 | HR 78 | Temp 98.2°F | Resp 22 | Ht 64.0 in | Wt 142.4 lb

## 2012-05-25 DIAGNOSIS — R0602 Shortness of breath: Secondary | ICD-10-CM

## 2012-05-25 DIAGNOSIS — R079 Chest pain, unspecified: Secondary | ICD-10-CM

## 2012-05-25 NOTE — Progress Notes (Signed)
This is an 78 year old man from [place] who is getting up in the middle the night last night at 4 AM and he mistakenly turned the wrong way and fell on the floor which was padded with a rag". He initially felt pain in his lower thoracic chest posteriorly but over the last 8 hours is improved. He's had a long ongoing problem with his breathing as well. He says that after a cold last winter he still notices some expiratory wheezes at times.  In addition to the chest pain, patient did suffer a few minor abrasions of his right lower extremity and he feels now.  Objective: Anxious appearing elderly man in no acute distress HEENT: Unremarkable Head: Atraumatic Chest: A few fine rales at both bases otherwise clear Heart: Regular without murmur Neck: Supple without tenderness CVA: Nontender Abdomen: Mild tenderness in the epigastrium with deep palpation otherwise no HSM or tenderness. Skin: Small ecchymosis of the right flank and a few small abrasions on the right lateral lower extremity [facility] reading (PRIMARY) by  Dr. Milus Glazier CXR. Small effusion R  Assessment:  Fall by mistake without serious consequence, persistent anxiety with recent focus on breathing  Plan:  Follow up XCR tomorrow, hold coumadin today

## 2012-05-26 ENCOUNTER — Ambulatory Visit (INDEPENDENT_AMBULATORY_CARE_PROVIDER_SITE_OTHER): Payer: Medicare Other | Admitting: Family Medicine

## 2012-05-26 ENCOUNTER — Ambulatory Visit: Payer: Medicare Other

## 2012-05-26 VITALS — BP 82/58 | HR 70 | Temp 97.9°F | Resp 20 | Ht 64.0 in | Wt 141.0 lb

## 2012-05-26 DIAGNOSIS — R0602 Shortness of breath: Secondary | ICD-10-CM

## 2012-05-26 DIAGNOSIS — R0781 Pleurodynia: Secondary | ICD-10-CM

## 2012-05-26 DIAGNOSIS — R079 Chest pain, unspecified: Secondary | ICD-10-CM

## 2012-05-26 NOTE — Progress Notes (Signed)
Now 2 days after fall.  [name] did not sleep well, with diffuse chest aches, especially when he moves or coughs, sneezes or laughs.  He is not short of breath.  Objective:  BP 110,60 on expiration, 100.60 on inspiration (pulsus paradoxus) Alert, NAD Chest:  Bibasilar rales Heart:  Intermittently irreg, I/VI systolic murmur  [facility] reading (PRIMARY) by  Dr. Milus Glazier:  Small increase in size of effusion.  Assessment:  Small but increasing effusion  Plan:  CXR tomorrow Extra Lasix today.

## 2012-05-27 ENCOUNTER — Ambulatory Visit: Payer: Medicare Other

## 2012-05-27 ENCOUNTER — Ambulatory Visit (INDEPENDENT_AMBULATORY_CARE_PROVIDER_SITE_OTHER): Payer: Medicare Other | Admitting: Family Medicine

## 2012-05-27 VITALS — BP 116/62 | HR 72 | Temp 98.0°F | Resp 18 | Ht 64.0 in | Wt 140.0 lb

## 2012-05-27 DIAGNOSIS — R0602 Shortness of breath: Secondary | ICD-10-CM

## 2012-05-27 DIAGNOSIS — J90 Pleural effusion, not elsewhere classified: Secondary | ICD-10-CM

## 2012-05-27 NOTE — Progress Notes (Signed)
This is an 78 year old man from [place] who is brought in by his daughter and son-in-law for followup on pleural effusion. He had a better night last night with less pain. He also went for short walk yesterday which was uneventful. He continues to have back pain with deep breath or cough.  Objective: Vital signs reviewed and are stable. Patient appears comfortable and relaxed sitting on exam table and lying down. HEENT: Unremarkable with good color in his cheeks Chest: Bibasilar rales with slight decrease of breath sounds in the right Heart: Regular with 1/6 systolic ejection murmur Abdomen: Soft nontender without HSM-this improvement from yesterday Extremities: No edema  [facility] reading (PRIMARY) by  Dr. Milus Glazier:  Persistent right pleural effusion, left effusion resolved.  I did affect the patient is not in acute situation and had to reassure the family at length about his progress  Assessment:  Continued improvement but small right pleural effusion will need some follow up  Plan:  Continue extra furosemide daily, recheck on Friday.

## 2012-05-29 ENCOUNTER — Ambulatory Visit (INDEPENDENT_AMBULATORY_CARE_PROVIDER_SITE_OTHER): Payer: Medicare Other

## 2012-05-29 ENCOUNTER — Other Ambulatory Visit: Payer: Self-pay | Admitting: Family Medicine

## 2012-05-29 DIAGNOSIS — I4891 Unspecified atrial fibrillation: Secondary | ICD-10-CM

## 2012-05-29 LAB — POCT INR: INR: 1.9

## 2012-05-29 MED ORDER — ZOLPIDEM TARTRATE 10 MG PO TABS: 10.0000 mg | ORAL_TABLET | Freq: Every evening | ORAL | Status: DC | PRN

## 2012-05-30 ENCOUNTER — Encounter: Payer: Self-pay | Admitting: Family Medicine

## 2012-05-30 ENCOUNTER — Ambulatory Visit: Payer: Medicare Other

## 2012-05-30 ENCOUNTER — Ambulatory Visit (INDEPENDENT_AMBULATORY_CARE_PROVIDER_SITE_OTHER): Payer: Medicare Other | Admitting: Family Medicine

## 2012-05-30 VITALS — BP 104/54

## 2012-05-30 DIAGNOSIS — J90 Pleural effusion, not elsewhere classified: Secondary | ICD-10-CM

## 2012-05-30 DIAGNOSIS — Z09 Encounter for follow-up examination after completed treatment for conditions other than malignant neoplasm: Secondary | ICD-10-CM

## 2012-05-30 NOTE — Progress Notes (Signed)
This 78 yo gentleman from [place] is here for follow up of his fall and subsequent pleural effusions.  He is sleeping well and back to his routine 45 minute walk daily.  He still has some pain with occasional cough, but otherwise is painfree. He has no edema, chest pain or shortness of breath. His INR yesterday was 1.9  Objective:  NAD HEENT unremarkable Neck:  No adenopathy or JVD Chest:  Bibasilar rales with deep inspiration Heart:  Regular, I to II/VI systolic murmur Ext:  No edema Back:  nontender  [facility] reading (PRIMARY) by  Dr. Milus Glazier:  Improvement in right pleural effusion  Assessment:  Recovering well from the fall, effusion disappearing., overall significant improvement  Plan:  Follow up one week Resume lasix 40 qd.

## 2012-06-06 ENCOUNTER — Ambulatory Visit (INDEPENDENT_AMBULATORY_CARE_PROVIDER_SITE_OTHER): Payer: Medicare Other | Admitting: Family Medicine

## 2012-06-06 VITALS — BP 111/70 | HR 74 | Temp 98.0°F | Resp 16

## 2012-06-06 DIAGNOSIS — J90 Pleural effusion, not elsewhere classified: Secondary | ICD-10-CM

## 2012-06-06 DIAGNOSIS — R079 Chest pain, unspecified: Secondary | ICD-10-CM

## 2012-06-06 NOTE — Progress Notes (Signed)
78 year old gentleman comes in for followup of injury after fall several weeks ago. He had a small effusion at that time and was having a bad cough and chest pain. Since that time has had no shortness of breath and his cough is gone. He does have a little discomfort when he forces a cough but otherwise is moving normally and is back to his regular routine  Objective: Patient has no acute distress, seen with wife and daughter HEENT: Unremarkable Chest: Clear to auscultation today, no ecchymosis Heart: Occasional irregular with 1/6 systolic murmur and no gallop Extremities: No edema Assessment: Back to baseline

## 2012-06-11 ENCOUNTER — Ambulatory Visit (INDEPENDENT_AMBULATORY_CARE_PROVIDER_SITE_OTHER): Payer: Medicare Other | Admitting: Cardiology

## 2012-06-11 ENCOUNTER — Ambulatory Visit (INDEPENDENT_AMBULATORY_CARE_PROVIDER_SITE_OTHER): Payer: Medicare Other | Admitting: *Deleted

## 2012-06-11 ENCOUNTER — Encounter: Payer: Self-pay | Admitting: Cardiology

## 2012-06-11 VITALS — BP 110/62 | HR 76 | Ht 64.0 in | Wt 137.0 lb

## 2012-06-11 DIAGNOSIS — I4891 Unspecified atrial fibrillation: Secondary | ICD-10-CM

## 2012-06-11 DIAGNOSIS — I080 Rheumatic disorders of both mitral and aortic valves: Secondary | ICD-10-CM

## 2012-06-11 DIAGNOSIS — R0602 Shortness of breath: Secondary | ICD-10-CM

## 2012-06-11 DIAGNOSIS — E785 Hyperlipidemia, unspecified: Secondary | ICD-10-CM

## 2012-06-11 DIAGNOSIS — I2510 Atherosclerotic heart disease of native coronary artery without angina pectoris: Secondary | ICD-10-CM

## 2012-06-11 DIAGNOSIS — I5022 Chronic systolic (congestive) heart failure: Secondary | ICD-10-CM

## 2012-06-11 NOTE — Patient Instructions (Addendum)
Your physician recommends that you return for lab work in 10 days:  Lipids, Liver, BMET and BNP.  Your physician recommends that you schedule a follow-up appointment in: 3 months with Dr. Shirlee Latch.  Take an additional Lasix 20mg  (1/2 tablet) every other day and additional potassium (1/2 tablet) with the Lasix.

## 2012-06-11 NOTE — Progress Notes (Signed)
Patient ID: [name], male   DOB: 07/11/1926, 78 y.o.   MRN: [id number] PCP: Dr. Milus Glazier  78 yo with history of CAD, ischemic CMP, CKD, and paroxysmal atrial fibrillation presents for cardiology followup. He speaks little [place] and his daughter interprets.  He has felt no sustained atrial fibrillation and is in NSR today. He will occasionally feel some mild fluttering in his chest.  No chest pain.  He walks about 3 miles a day and can climb a flight of steps with no exertional dyspnea.  He has slept on 2 pillows He has not had as much problem with dizziness recently.  Patient has a history of myalgias with Crestor and Lipitor.  He has tolerated pravastatin without myalgias.    Earlier this month, he tripped and fell, hitting his right side, with resulting hematoma on the right chest wall.  CXR was done showing small to moderate right pleural effusion.  Patient was told to take Lasix 40 mg bid for 5 days then go back to 40 mg daily.  He did this.  Repeat CXR   Showed only a tiny residual effusion.  Patient denies any increase in dyspnea throughout this process.  However, he does note increased chronic fatigue and poor appetite.     Labs (9/11): K 4.1, creatinine 1.4  Labs (8/12): K 3.8, creatinine 1.3, TSH normal, LFTs normal, LDL 96, HDL 50 Labs (1/13): K 3.9, creatinine 1.3, TSH normal, LFTs normal Labs (2/13): K 3.8, creatinine 1.3 Labs (5/13): K 3.5, creatinine 1.4, BNP 331, LFTs normal, TSH normal  Allergies (verified):  1) ! Crestor  2) Vytorin  3) Atenolol   Past Medical History:  1. Coronary artery disease: [facility] 4/06 with 60% pLAD, 40% ramus, 60% mRCA, and 90% mPDA. No intervention. Inferior akinesis on LV-gram was suggestive of possible inferior MI with recanalization.  2. Hyperlipidemia: Myalgias with Lipitor and Crestor.  3. Hypothyroidism  4. Peripheral vascular disease  5. Anxiety  6. Skin cancer, hx of 2008 BCC  7. Colonic polyps, hx of  8. Paroxysmal atrial  fibrillation with rapid ventricular response treated with amiodarone therapy converting to normal sinus rhythm 02/2009  9. Systolic CHF: Probable ischemic cardiomyopathy. Echo (6/10) with EF 40%, mildly dilated LV, moderate MR.  Echo (3/12): EF 30-35%, inferolateral severe hypokinesis, trivial AI, moderate to severe MR.  10. History of PNA  11. CKD. Baseline creatinine around 1.4.  12. Mitral regurgitation: Moderate by echo 6/10. 2-3+ by cath 2006.  Moderate-severe by echo in 3/12.  13. PVCs   Family History:  Family History of CAD Male 1st degree relative <60   Social History:  Retired  Originally from [place], speaks little English  Married  Former Smoker   Review of Systems:  All systems reviewed and negative except as per HPI.   Current Outpatient Prescriptions  Medication Sig Dispense Refill  . amiodarone (PACERONE) 200 MG tablet TAKE 1/2 TABLET BY MOUTH DAILY  30 tablet  6  . co-enzyme Q-10 30 MG capsule Take 200 mg by mouth daily.       . furosemide (LASIX) 40 MG tablet Take 40 mg by mouth daily.       Marland Kitchen KLOR-CON M20 20 MEQ tablet Take 1 tablet (20 mEq total) by mouth daily.  90 tablet  1  . levothyroxine (SYNTHROID, LEVOTHROID) 100 MCG tablet Take 1 tablet (100 mcg total) by mouth daily.  90 tablet  3  . lisinopril (PRINIVIL,ZESTRIL) 2.5 MG tablet Take 1 tablet (2.5 mg total)  by mouth daily.  30 tablet  6  . metoprolol succinate (TOPROL-XL) 25 MG 24 hr tablet Take 1 tablet (25 mg total) by mouth daily.  90 tablet  3  . olopatadine (PATANOL) 0.1 % ophthalmic solution Place 1 drop into both eyes 2 (two) times daily as needed for allergies.  5 mL  12  . Omega-3 Fatty Acids (FISH OIL) 1200 MG CAPS Take by mouth 2 (two) times daily.        . pravastatin (PRAVACHOL) 80 MG tablet Take 1 tablet (80 mg total) by mouth daily.  90 tablet  1  . Tamsulosin HCl (FLOMAX) 0.4 MG CAPS Take 1 capsule (0.4 mg total) by mouth daily.  90 capsule  3  . warfarin (COUMADIN) 1 MG tablet Use as  directed by anticoagulation clinic.  60 tablet  3  . zolpidem (AMBIEN) 10 MG tablet Take 1 tablet (10 mg total) by mouth at bedtime as needed for sleep.  30 tablet  0    BP 110/62  Pulse 76  Ht 5\' 4"  (1.626 m)  Wt 137 lb (62.143 kg)  BMI 23.52 kg/m2 General: NAD Neck: JVP 8-9 cm, no thyromegaly or thyroid nodule.  Lungs: Slight crackles at the bases bilaterally.  CV: Nondisplaced PMI.  Heart regular S1/S2, no S3/S4, 2/6 HSM at apex.  No edema edema.  No carotid bruit.  Normal pedal pulses.  Abdomen: Soft, nontender, no hepatosplenomegaly, no distention.  Neurologic: Alert and oriented x 3.  Psych: Normal affect. Extremities: No clubbing or cyanosis.   Assessment/Plan: 1. ATRIAL FIBRILLATION  Maintaining NSR on amiodarone at low dose. Continue amiodarone, Toprol XL, warfarin. Willl need to follow LFTs and TSH while on amiodarone (have been normal).  He will need yearly eye exams.  2. HYPERLIPIDEMIA  He is tolerating pravastatin without myalgias. Had myalgias with Lipitor and Crestor. Amiodarone use precludes more than very low dose simvastatin. LDL was above goal of < 70 when last checked. For now, will have him work on diet/exercise.  I will repeat lipids.  3. MITRAL INSUFFICIENCY Last echo showed moderate to severe MR. EF is 30-35%. NYHA class II symptoms currently. Given age and medical condition, would plan medical management.  4. SYSTOLIC HEART FAILURE, CHRONIC  EF 30-35% on last echo, seems to be NYHA class II symptomatically. Mild volume overload on exam.  I will have him continue his am 40 mg Lasix but would like him to take an extra afternoon dose of 20 mg daily every other day.  On days he takes the extra Lasix he should take an additional 10 mEq of KCl.  Would not place ICD given age of 72. Check BMET, BNP in 2 wks. He will continue current doses of lisinopril and Toprol XL.   Marca Ancona 06/11/2012

## 2012-06-20 ENCOUNTER — Other Ambulatory Visit: Payer: Self-pay | Admitting: *Deleted

## 2012-06-20 DIAGNOSIS — I10 Essential (primary) hypertension: Secondary | ICD-10-CM

## 2012-06-20 DIAGNOSIS — I2510 Atherosclerotic heart disease of native coronary artery without angina pectoris: Secondary | ICD-10-CM

## 2012-06-20 DIAGNOSIS — I080 Rheumatic disorders of both mitral and aortic valves: Secondary | ICD-10-CM

## 2012-06-20 DIAGNOSIS — E785 Hyperlipidemia, unspecified: Secondary | ICD-10-CM

## 2012-06-20 DIAGNOSIS — R0602 Shortness of breath: Secondary | ICD-10-CM

## 2012-06-20 DIAGNOSIS — I4891 Unspecified atrial fibrillation: Secondary | ICD-10-CM

## 2012-06-23 ENCOUNTER — Other Ambulatory Visit (INDEPENDENT_AMBULATORY_CARE_PROVIDER_SITE_OTHER): Payer: Medicare Other

## 2012-06-23 ENCOUNTER — Telehealth: Payer: Self-pay | Admitting: Cardiology

## 2012-06-23 DIAGNOSIS — I2510 Atherosclerotic heart disease of native coronary artery without angina pectoris: Secondary | ICD-10-CM

## 2012-06-23 DIAGNOSIS — E785 Hyperlipidemia, unspecified: Secondary | ICD-10-CM

## 2012-06-23 DIAGNOSIS — I4891 Unspecified atrial fibrillation: Secondary | ICD-10-CM

## 2012-06-23 DIAGNOSIS — I080 Rheumatic disorders of both mitral and aortic valves: Secondary | ICD-10-CM

## 2012-06-23 DIAGNOSIS — I10 Essential (primary) hypertension: Secondary | ICD-10-CM

## 2012-06-23 DIAGNOSIS — R0602 Shortness of breath: Secondary | ICD-10-CM

## 2012-06-23 LAB — LIPID PANEL
Cholesterol: 155 mg/dL (ref 0–200)
HDL: 52.9 mg/dL (ref >39.00)

## 2012-06-23 LAB — BASIC METABOLIC PANEL
Calcium: 8.7 mg/dL (ref 8.4–10.5)
GFR: 52.55 mL/min — ABNORMAL LOW (ref >60.00)
Glucose, Bld: 111 mg/dL — ABNORMAL HIGH (ref 70–99)
Sodium: 136 mEq/L (ref 135–145)

## 2012-06-23 LAB — HEPATIC FUNCTION PANEL
ALT: 17 U/L (ref 0–53)
AST: 26 U/L (ref 0–37)
Bilirubin, Direct: 0.2 mg/dL (ref 0.0–0.3)
Total Bilirubin: 0.7 mg/dL (ref 0.3–1.2)
Total Protein: 7.4 g/dL (ref 6.0–8.3)

## 2012-06-23 LAB — BRAIN NATRIURETIC PEPTIDE: Pro B Natriuretic peptide (BNP): 547 pg/mL — ABNORMAL HIGH (ref 0.0–100.0)

## 2012-06-23 NOTE — Telephone Encounter (Signed)
Pt had lab work done and they would like the results

## 2012-06-23 NOTE — Telephone Encounter (Signed)
Lab done today--results not ready yet.

## 2012-06-24 NOTE — Telephone Encounter (Signed)
Pt's daughter,Natasha notified of lab results done 06/24/12.

## 2012-06-24 NOTE — Telephone Encounter (Signed)
Pt's dtr  Calling re message from [place], was not called yesterday that results were not ready, I told her they do take 24 hrs,  would like a call back today

## 2012-06-30 ENCOUNTER — Telehealth: Payer: Self-pay | Admitting: Cardiology

## 2012-06-30 ENCOUNTER — Other Ambulatory Visit: Payer: Self-pay | Admitting: Cardiology

## 2012-06-30 NOTE — Telephone Encounter (Signed)
New Rx for Lasix and Klo-Con due to Increased based on last office notes.

## 2012-07-09 ENCOUNTER — Ambulatory Visit (INDEPENDENT_AMBULATORY_CARE_PROVIDER_SITE_OTHER): Payer: Medicare Other | Admitting: *Deleted

## 2012-07-09 ENCOUNTER — Encounter: Payer: Self-pay | Admitting: *Deleted

## 2012-07-09 DIAGNOSIS — H02829 Cysts of unspecified eye, unspecified eyelid: Secondary | ICD-10-CM | POA: Insufficient documentation

## 2012-07-09 DIAGNOSIS — I739 Peripheral vascular disease, unspecified: Secondary | ICD-10-CM

## 2012-07-09 DIAGNOSIS — I4891 Unspecified atrial fibrillation: Secondary | ICD-10-CM

## 2012-07-09 MED ORDER — WARFARIN SODIUM 1 MG PO TABS: ORAL_TABLET | ORAL | Status: DC

## 2012-07-10 ENCOUNTER — Other Ambulatory Visit: Payer: Self-pay | Admitting: Cardiology

## 2012-07-11 ENCOUNTER — Other Ambulatory Visit: Payer: Self-pay | Admitting: Family Medicine

## 2012-07-11 DIAGNOSIS — H01003 Unspecified blepharitis right eye, unspecified eyelid: Secondary | ICD-10-CM

## 2012-07-11 MED ORDER — MOXIFLOXACIN HCL 0.5 % OP SOLN: 1.0000 [drp] | Freq: Two times a day (BID) | OPHTHALMIC | Status: DC

## 2012-07-15 ENCOUNTER — Other Ambulatory Visit: Payer: Self-pay | Admitting: Family Medicine

## 2012-07-15 ENCOUNTER — Ambulatory Visit (INDEPENDENT_AMBULATORY_CARE_PROVIDER_SITE_OTHER): Payer: Medicare Other | Admitting: Family Medicine

## 2012-07-15 DIAGNOSIS — H029 Unspecified disorder of eyelid: Secondary | ICD-10-CM

## 2012-07-15 DIAGNOSIS — Z23 Encounter for immunization: Secondary | ICD-10-CM

## 2012-07-15 MED ORDER — INFLUENZA VIRUS VACC SPLIT PF IM SUSP: 0.5000 mL | Freq: Once | INTRAMUSCULAR | Status: DC

## 2012-07-15 NOTE — Progress Notes (Signed)
78 year old gentleman with left thigh discharge and irritation of a papule on the lower mid lid margin. I gave him some Vigamox recently and this has helped a little bit but he continues to have purulent discharge in the medial canthus.  Objective: Mild mucopurulent, yellow discharge in the medial canthus. Small 1 mm papule lower lid margin.  Assessment: Granulomatous irritation with chronic blepharitis  Plan: Refer to ophthalmologist to see if this can be curetted Flu shot today

## 2012-07-29 ENCOUNTER — Other Ambulatory Visit: Payer: Self-pay | Admitting: Cardiology

## 2012-08-04 ENCOUNTER — Ambulatory Visit (INDEPENDENT_AMBULATORY_CARE_PROVIDER_SITE_OTHER): Payer: Medicare Other | Admitting: Pharmacist

## 2012-08-04 DIAGNOSIS — I4891 Unspecified atrial fibrillation: Secondary | ICD-10-CM

## 2012-08-25 ENCOUNTER — Ambulatory Visit (INDEPENDENT_AMBULATORY_CARE_PROVIDER_SITE_OTHER): Payer: Medicare Other | Admitting: Pharmacist

## 2012-08-25 DIAGNOSIS — I4891 Unspecified atrial fibrillation: Secondary | ICD-10-CM

## 2012-08-26 ENCOUNTER — Telehealth: Payer: Self-pay | Admitting: Cardiology

## 2012-08-26 NOTE — Telephone Encounter (Signed)
New Problem:    Patient's daughter called in wanting to speak with someone because the patient had a  nosebleed today. Please call back.

## 2012-08-26 NOTE — Telephone Encounter (Signed)
Telephoned pt's daughter back to discuss nosebleeds. She states he is not bleeding but having some occ. Spotting.  Encouraged use of normal saline spray to use as moisturizer. Call if it worsens.

## 2012-08-29 ENCOUNTER — Encounter (HOSPITAL_COMMUNITY): Payer: Self-pay | Admitting: Adult Health

## 2012-08-29 DIAGNOSIS — Z8659 Personal history of other mental and behavioral disorders: Secondary | ICD-10-CM | POA: Insufficient documentation

## 2012-08-29 DIAGNOSIS — I4891 Unspecified atrial fibrillation: Secondary | ICD-10-CM | POA: Insufficient documentation

## 2012-08-29 DIAGNOSIS — I5020 Unspecified systolic (congestive) heart failure: Secondary | ICD-10-CM | POA: Insufficient documentation

## 2012-08-29 DIAGNOSIS — Z860100 Personal history of colon polyps, unspecified: Secondary | ICD-10-CM | POA: Insufficient documentation

## 2012-08-29 DIAGNOSIS — N189 Chronic kidney disease, unspecified: Secondary | ICD-10-CM | POA: Insufficient documentation

## 2012-08-29 DIAGNOSIS — Z7901 Long term (current) use of anticoagulants: Secondary | ICD-10-CM | POA: Insufficient documentation

## 2012-08-29 DIAGNOSIS — R319 Hematuria, unspecified: Secondary | ICD-10-CM | POA: Insufficient documentation

## 2012-08-29 DIAGNOSIS — E785 Hyperlipidemia, unspecified: Secondary | ICD-10-CM | POA: Insufficient documentation

## 2012-08-29 DIAGNOSIS — Z79899 Other long term (current) drug therapy: Secondary | ICD-10-CM | POA: Insufficient documentation

## 2012-08-29 DIAGNOSIS — I4949 Other premature depolarization: Secondary | ICD-10-CM | POA: Insufficient documentation

## 2012-08-29 DIAGNOSIS — I2510 Atherosclerotic heart disease of native coronary artery without angina pectoris: Secondary | ICD-10-CM | POA: Insufficient documentation

## 2012-08-29 DIAGNOSIS — Z85828 Personal history of other malignant neoplasm of skin: Secondary | ICD-10-CM | POA: Insufficient documentation

## 2012-08-29 DIAGNOSIS — I739 Peripheral vascular disease, unspecified: Secondary | ICD-10-CM | POA: Insufficient documentation

## 2012-08-29 DIAGNOSIS — Z8701 Personal history of pneumonia (recurrent): Secondary | ICD-10-CM | POA: Insufficient documentation

## 2012-08-29 DIAGNOSIS — E039 Hypothyroidism, unspecified: Secondary | ICD-10-CM | POA: Insufficient documentation

## 2012-08-29 DIAGNOSIS — I379 Nonrheumatic pulmonary valve disorder, unspecified: Secondary | ICD-10-CM | POA: Insufficient documentation

## 2012-08-29 LAB — CBC WITH DIFFERENTIAL/PLATELET
Eosinophils Relative: 0 % (ref 0–5)
HCT: 36.7 % — ABNORMAL LOW (ref 39.0–52.0)
Lymphocytes Relative: 30 % (ref 12–46)
Lymphs Abs: 2.5 10*3/uL (ref 0.7–4.0)
MCV: 90 fL (ref 78.0–100.0)
Monocytes Absolute: 0.9 10*3/uL (ref 0.1–1.0)
RBC: 4.08 MIL/uL — ABNORMAL LOW (ref 4.22–5.81)
WBC: 8.4 10*3/uL (ref 4.0–10.5)

## 2012-08-29 LAB — URINALYSIS, ROUTINE W REFLEX MICROSCOPIC
Glucose, UA: NEGATIVE mg/dL
Protein, ur: >300 mg/dL — AB
Specific Gravity, Urine: 1.024 (ref 1.005–1.030)
pH: 6 (ref 5.0–8.0)

## 2012-08-29 LAB — URINE MICROSCOPIC-ADD ON

## 2012-08-29 LAB — PROTIME-INR: INR: 2.3 — ABNORMAL HIGH (ref 0.00–1.49)

## 2012-08-29 NOTE — ED Notes (Signed)
Presents with hematuria that happened once in July. Happened again today. Pt has urinary catheter in place. He takes coumadin and urologist told them that it was normal to have blood in urine with catheter. Monday his coumadin level checked and it was 3.7. Family concerned due to thinner blood that he could have internal bleeding from bladder. Primary doctor told them to come here and have level checked. Pt denies pain.

## 2012-08-30 ENCOUNTER — Emergency Department (HOSPITAL_COMMUNITY)
Admission: EM | Admit: 2012-08-30 | Discharge: 2012-08-30 | Disposition: A | Discharge status: Home / Self Care | Payer: Medicare Other | Attending: Emergency Medicine | Admitting: Emergency Medicine

## 2012-08-30 DIAGNOSIS — R319 Hematuria, unspecified: Secondary | ICD-10-CM

## 2012-08-30 LAB — BASIC METABOLIC PANEL
CO2: 24 mEq/L (ref 19–32)
Calcium: 9.2 mg/dL (ref 8.4–10.5)
Chloride: 100 mEq/L (ref 96–112)
Potassium: 4.3 mEq/L (ref 3.5–5.1)
Sodium: 136 mEq/L (ref 135–145)

## 2012-08-30 NOTE — ED Provider Notes (Signed)
History     CSN: [id number]  Arrival date & time 08/29/12  2242   First MD Initiated Contact with Patient 08/30/12 [phone]      Chief Complaint  Patient presents with  . Hematuria    (Consider location/radiation/quality/duration/timing/severity/associated sxs/prior treatment) HPI.... hematuria earlier today.  Patient has had a chronic indwelling urinary catheter for several months secondary to bladder atonia.  He is also on Coumadin and his INR has been elevated recently to 3.7.   No other bleeding in his body.  No chest pain, dyspnea, dysuria, fever, chills, flank pain. Severity is mild to moderate. Nothing makes symptoms better or worse.  Past Medical History  Diagnosis Date  . CAD (coronary artery disease)     [facility] 4/06 with 60% pLAD, 40% ramus, 60% mRCA, and 90% mPDA. No intervention. Inferior akinesis on LV-gram was suggestive of possible inferoir MI with recanalization.   . Hyperlipidemia     Myalgias with Lipitor and Crestor   . Hypothyroidism   . Peripheral vascular disease   . Anxiety   . Skin cancer     Hx of 2008 BCC  . Hx of colonic polyps   . Paroxysmal atrial fibrillation 02/2009     with rapid ventricular response treated with amiodarone therapy converting to normal sinus rhythm  . Systolic CHF     a.  Probable ischemic cardiomyopathy. Echo (6/10) with EF 40%, mildly dilated LV, moderate MR. ;  b.  echo 3/12: mild LVH, EF 30-35%, IL HK and Lat HK, mod to severe MR, trivial AI, mod LAE  . PNA (pneumonia)     History of   . CKD (chronic kidney disease)     Baseline creatinine around 1.4  . Mitral regurgitation     Moderate by echo 6/10. 2-3+ by cath 2006; mod to severe by echo 3/12  . PVC's (premature ventricular contractions)     Past Surgical History  Procedure Date  . Skin cancer excision 2008    shoulder and nose     Family History  Problem Relation Age of Onset  . Coronary artery disease Other     male, 1st degree relative <60     History   Substance Use Topics  . Smoking status: Never Smoker   . Smokeless tobacco: Not on file  . Alcohol Use: No      Review of Systems  All other systems reviewed and are negative.    Allergies  Atenolol; Ezetimibe-simvastatin; and Rosuvastatin  Home Medications   Current Outpatient Rx  Name  Route  Sig  Dispense  Refill  . AMIODARONE HCL 200 MG PO TABS      TAKE 1/2 TABLET BY MOUTH DAILY   30 tablet   5   . COENZYME Q10 30 MG PO CAPS   Oral   Take 200 mg by mouth daily.          . FUROSEMIDE 40 MG PO TABS   Oral   Take 40 mg by mouth daily.          Marland Kitchen KLOR-CON M20 20 MEQ PO TBCR      TAKE 1 TABLET (20 MEQ TOTAL) BY MOUTH DAILY.   90 tablet   1     PLEASE SEND IN NEW DIRECTIONS FOR THIS DRUG AND LA ...   . LEVOTHYROXINE SODIUM 100 MCG PO TABS   Oral   Take 1 tablet (100 mcg total) by mouth daily.   90 tablet   3   .  LISINOPRIL 2.5 MG PO TABS   Oral   Take 1 tablet (2.5 mg total) by mouth daily.   30 tablet   6   . METOPROLOL SUCCINATE ER 25 MG PO TB24   Oral   Take 1 tablet (25 mg total) by mouth daily.   90 tablet   3   . FISH OIL 1200 MG PO CAPS   Oral   Take by mouth 2 (two) times daily.           Marland Kitchen PRAVASTATIN SODIUM 80 MG PO TABS      TAKE 1 TABLET (80 MG TOTAL) BY MOUTH DAILY.   90 tablet   1   . TAMSULOSIN HCL 0.4 MG PO CAPS   Oral   Take 1 capsule (0.4 mg total) by mouth daily.   90 capsule   3   . WARFARIN SODIUM 1 MG PO TABS   Oral   Take 1-2 mg by mouth daily. Take 1 tablet on Tuesday and Friday then take 2 tablets the other days         . ZOLPIDEM TARTRATE 10 MG PO TABS   Oral   Take 1 tablet (10 mg total) by mouth at bedtime as needed for sleep.   30 tablet   0     BP 101/63  Pulse 73  Temp 98 F (36.7 C) (Oral)  Resp 18  SpO2 99%  Physical Exam  Nursing note and vitals reviewed. Constitutional: He is oriented to person, place, and time. He appears well-developed and well-nourished.  HENT:  Head:  Normocephalic and atraumatic.  Eyes: Conjunctivae normal and EOM are normal. Pupils are equal, round, and reactive to light.  Neck: Normal range of motion. Neck supple.  Cardiovascular: Normal rate, regular rhythm and normal heart sounds.   Pulmonary/Chest: Effort normal and breath sounds normal.  Abdominal: Soft. Bowel sounds are normal.  Genitourinary:       No flank tenderness. Genital exam normal. Urine is light red  Musculoskeletal: Normal range of motion.  Neurological: He is alert and oriented to person, place, and time.  Skin: Skin is warm and dry.  Psychiatric: He has a normal mood and affect.    ED Course  Procedures (including critical care time)  Labs Reviewed  CBC WITH DIFFERENTIAL - Abnormal; Notable for the following:    RBC 4.08 (*)     Hemoglobin 12.3 (*)     HCT 36.7 (*)     Platelets 132 (*)     All other components within normal limits  APTT - Abnormal; Notable for the following:    aPTT 39 (*)     All other components within normal limits  PROTIME-INR - Abnormal; Notable for the following:    Prothrombin Time 24.3 (*)     INR 2.30 (*)     All other components within normal limits  BASIC METABOLIC PANEL - Abnormal; Notable for the following:    Glucose, Bld 140 (*)     BUN 26 (*)     Creatinine, Ser 1.49 (*)     GFR calc non Af Amer 41 (*)     GFR calc Af Amer 48 (*)     All other components within normal limits  URINALYSIS, ROUTINE W REFLEX MICROSCOPIC - Abnormal; Notable for the following:    Color, Urine RED (*)  BIOCHEMICALS MAY BE AFFECTED BY COLOR   APPearance CLOUDY (*)     Hgb urine dipstick LARGE (*)  Bilirubin Urine SMALL (*)     Ketones, ur 15 (*)     Protein, ur >300 (*)     Nitrite POSITIVE (*)     Leukocytes, UA MODERATE (*)     All other components within normal limits  URINE MICROSCOPIC-ADD ON  URINE CULTURE   No results found.   1. Hematuria       MDM  INR is 2.30       discussed treatment options with family.     Urine will be cultured.   I recommended followup at [facility] or [facility] if symptoms persist.   Patient is nontoxic.        Donnetta Hutching, MD 08/30/12 [phone]

## 2012-08-31 ENCOUNTER — Encounter: Payer: Self-pay | Admitting: Family Medicine

## 2012-08-31 ENCOUNTER — Ambulatory Visit (INDEPENDENT_AMBULATORY_CARE_PROVIDER_SITE_OTHER): Payer: Medicare Other | Admitting: Family Medicine

## 2012-08-31 VITALS — BP 104/66 | HR 73 | Temp 98.1°F | Resp 16 | Ht 64.0 in | Wt 137.8 lb

## 2012-08-31 DIAGNOSIS — R5383 Other fatigue: Secondary | ICD-10-CM

## 2012-08-31 DIAGNOSIS — I4891 Unspecified atrial fibrillation: Secondary | ICD-10-CM

## 2012-08-31 DIAGNOSIS — R5381 Other malaise: Secondary | ICD-10-CM

## 2012-08-31 LAB — URINE CULTURE

## 2012-08-31 NOTE — Progress Notes (Signed)
This is an 78 year old gentleman from [place] comes in with his wife, daughter, and son-in-law because of progressive fatigue over the last month. He says that although is not short of breath he can hear his breathing and just doesn't feel that he can walk anymore. Up until recently has been walking twice a day.  He's had no chest pain, no edema.  On Friday night he had an episode of hematuria and because of warnings given to him in the past with the Coumadin, he went promptly to the emergency room and was evaluated. He ended up staying in the emergency room from 10 PM until 6 AM on Saturday morning and really hasn't recovered since. A urine culture is pending.  Patient has an indwelling Foley catheter because of BPH.  Objective: Patient is alert, cooperative, and engaging HEENT: Unremarkable Neck: No JVD, no bruit her Chest: Bibasilar wet rales on inspiration over the lower to 3 cm of the pulmonary expansion.  Heart: Regular with a 1/6 systolic ejection type murmur  The pressure check was 100/60.  I spent 40 minutes face to face with family  Assessment: This is a 78 year old gentleman is become progressively more fatigued I suspect that some of this is related to his medication and some of its related to the stress on the body from the indwelling catheter, and some of it is just living like harming with his wife.  Plan: I've asked patient to stop the metoprolol for several days to see if this helps him with his energy. I also need to discuss with Dr. Shirlee Latch whether we could switch him from his Coumadin to something like Xarelto for safety's sake  I will try to contact Dr. Shirlee Latch tomorrow and will follow up with the family in three days.

## 2012-09-02 ENCOUNTER — Telehealth: Payer: Self-pay | Admitting: Cardiology

## 2012-09-02 NOTE — Telephone Encounter (Signed)
Sounds like we need to see him in the office soon (this week, with PA if I don't have opening).  Can cut Toprol XL dose in half for now.  Would likely be reasonable to put him on Xarelto 15 mg daily instead of coumadin.

## 2012-09-02 NOTE — Telephone Encounter (Signed)
Pt's daughter advised, verbalized understanding. Pt has an appt with Dr Shirlee Latch 09/10/12. Pt's daughter declined sooner appt for pt  with PA.

## 2012-09-02 NOTE — Telephone Encounter (Signed)
Thanks for getting back to me so promptly

## 2012-09-02 NOTE — Telephone Encounter (Signed)
Spoke with pt's daughter, Marcelle Smiling. She states pt saw Dr Milus Glazier 08/31/12 and he mentioned changing pt's BP medication. Pt's daughter states Dr Milus Glazier was going to talk with Dr Shirlee Latch about this yesterday. Pt's daughter asking if Dr Shirlee Latch has spoken with Dr Milus Glazier about changing medication. I will forward to Dr Shirlee Latch.

## 2012-09-02 NOTE — Telephone Encounter (Signed)
plz return call to pt daughter Marcelle Smiling [phone] regarding medication change.

## 2012-09-10 ENCOUNTER — Ambulatory Visit (INDEPENDENT_AMBULATORY_CARE_PROVIDER_SITE_OTHER): Payer: Medicare Other | Admitting: Cardiology

## 2012-09-10 ENCOUNTER — Ambulatory Visit (INDEPENDENT_AMBULATORY_CARE_PROVIDER_SITE_OTHER): Payer: Medicare Other | Admitting: *Deleted

## 2012-09-10 ENCOUNTER — Encounter: Payer: Self-pay | Admitting: Cardiology

## 2012-09-10 VITALS — BP 110/62 | HR 70 | Ht 64.0 in | Wt 139.0 lb

## 2012-09-10 DIAGNOSIS — I2510 Atherosclerotic heart disease of native coronary artery without angina pectoris: Secondary | ICD-10-CM

## 2012-09-10 DIAGNOSIS — E785 Hyperlipidemia, unspecified: Secondary | ICD-10-CM

## 2012-09-10 DIAGNOSIS — I4891 Unspecified atrial fibrillation: Secondary | ICD-10-CM

## 2012-09-10 DIAGNOSIS — I5022 Chronic systolic (congestive) heart failure: Secondary | ICD-10-CM

## 2012-09-10 DIAGNOSIS — R0602 Shortness of breath: Secondary | ICD-10-CM

## 2012-09-10 LAB — TSH: TSH: 6.95 u[IU]/mL — ABNORMAL HIGH (ref 0.35–5.50)

## 2012-09-10 LAB — BASIC METABOLIC PANEL
BUN: 21 mg/dL (ref 6–23)
Chloride: 102 mEq/L (ref 96–112)
Potassium: 4 mEq/L (ref 3.5–5.1)

## 2012-09-10 LAB — MAGNESIUM: Magnesium: 2.1 mg/dL (ref 1.5–2.5)

## 2012-09-10 LAB — HEPATIC FUNCTION PANEL
Alkaline Phosphatase: 52 U/L (ref 39–117)
Bilirubin, Direct: 0.1 mg/dL (ref 0.0–0.3)
Total Protein: 7.6 g/dL (ref 6.0–8.3)

## 2012-09-10 MED ORDER — DIGOXIN 125 MCG PO TABS: 0.1250 mg | ORAL_TABLET | Freq: Every day | ORAL | Status: DC

## 2012-09-10 MED ORDER — FUROSEMIDE 40 MG PO TABS: ORAL_TABLET | ORAL | Status: DC

## 2012-09-10 NOTE — Patient Instructions (Addendum)
Increase lasix(furosemide) to 40mg  in the morning and 20mg  in the afternoon about 4PM. This will be 1 of your 40mg  tablets in the morning and 1/2 of your 40mg  tablets in the afternoon about 4PM.  Start Dioxoin 0.125mg  daily.  Your physician recommends that you return for lab work today--BMET/MAG/TSH/Liver profile/BNP.  Your physician recommends that you return for lab work in: 10 days--BMET/Digoxin level.   Your physician recommends that you schedule a follow-up appointment in: about 2 weeks with Dr Shirlee Latch.

## 2012-09-10 NOTE — Progress Notes (Signed)
Patient ID: [name], male   DOB: [birth year], 78 y.o.   MRN: [id number] PCP: Dr. Milus Glazier  78 yo with history of CAD, ischemic CMP, CKD, and paroxysmal atrial fibrillation presents for cardiology followup. He speaks little [place] and his daughter interprets.  He has felt no sustained atrial fibrillation and is in NSR today. He will occasionally feel some mild fluttering in his chest.  No chest pain.  He walks 2.5-3 miles a day and can climb a flight of steps with no exertional dyspnea.  He has slept on 2 pillows chronically. Patient has a history of myalgias with Crestor and Lipitor.  He has tolerated pravastatin without myalgias.    Recently, patient's BP had been running on the low side and he complained of increased fatigue and lightheadedness.  His lisinopril and Toprol XL doses were cut back and BP has been better since then, running in the 90s-110s systolic.  He is less fatigued.  He does not seem to have had any change in his exercise tolerance.  Steady on feet, no falls.   Labs (9/11): K 4.1, creatinine 1.4  Labs (8/12): K 3.8, creatinine 1.3, TSH normal, LFTs normal, LDL 96, HDL 50 Labs (1/13): K 3.9, creatinine 1.3, TSH normal, LFTs normal Labs (2/13): K 3.8, creatinine 1.3 Labs (5/13): K 3.5, creatinine 1.4, BNP 331, LFTs normal, TSH normal Labs (9/13): LDL 85, HDL 53, BNP 547, LFTs normal Labs (12/13): K 4.3, creatinine 1.49  ECG: NSR, 1st degree AV block, LPFB, PVCs.  QT interval is prolonged.   Allergies (verified):  1) ! Crestor  2) Vytorin  3) Atenolol   Past Medical History:  1. Coronary artery disease: LHC 4/06 with 60% pLAD, 40% ramus, 60% mRCA, and 90% mPDA. No intervention. Inferior akinesis on LV-gram was suggestive of possible inferior MI with recanalization.  2. Hyperlipidemia: Myalgias with Lipitor and Crestor.  3. Hypothyroidism  4. Peripheral vascular disease  5. Anxiety  6. Skin cancer, hx of 2008 BCC  7. Colonic polyps, hx of  8. Paroxysmal atrial  fibrillation with rapid ventricular response treated with amiodarone therapy converting to normal sinus rhythm 02/2009  9. Systolic CHF: Probable ischemic cardiomyopathy. Echo (6/10) with EF 40%, mildly dilated LV, moderate MR.  Echo (3/12): EF 30-35%, inferolateral severe hypokinesis, trivial AI, moderate to severe MR.  10. History of PNA  11. CKD. Baseline creatinine around 1.4.  12. Mitral regurgitation: Moderate by echo 6/10. 2-3+ by cath 2006.  Moderate-severe by echo in 3/12.  13. PVCs  14. BPH  Family History:  Family History of CAD Male 1st degree relative <60   Social History:  Retired  Originally from [place], speaks little English  Married  Former Smoker   Review of Systems:  All systems reviewed and negative except as per HPI.   Current Outpatient Prescriptions  Medication Sig Dispense Refill  . amiodarone (PACERONE) 200 MG tablet TAKE 1/2 TABLET BY MOUTH DAILY  30 tablet  5  . co-enzyme Q-10 30 MG capsule Take 200 mg by mouth daily.       Marland Kitchen KLOR-CON M20 20 MEQ tablet TAKE 1 TABLET (20 MEQ TOTAL) BY MOUTH DAILY.  90 tablet  1  . levothyroxine (SYNTHROID, LEVOTHROID) 100 MCG tablet Take 1 tablet (100 mcg total) by mouth daily.  90 tablet  3  . lisinopril (PRINIVIL,ZESTRIL) 2.5 MG tablet Take 1 tablet (2.5 mg total) by mouth daily.  30 tablet  6  . metoprolol succinate (TOPROL-XL) 25 MG 24 hr tablet 1/2  tablet (total 12.5mg ) daily      . Omega-3 Fatty Acids (FISH OIL) 1200 MG CAPS Take by mouth 2 (two) times daily.        . pravastatin (PRAVACHOL) 80 MG tablet TAKE 1 TABLET (80 MG TOTAL) BY MOUTH DAILY.  90 tablet  1  . Tamsulosin HCl (FLOMAX) 0.4 MG CAPS Take 1 capsule (0.4 mg total) by mouth daily.  90 capsule  3  . warfarin (COUMADIN) 1 MG tablet Take 1-2 mg by mouth daily. Take 1 tablet on Tuesday and Friday then take 2 tablets the other days      . zolpidem (AMBIEN) 10 MG tablet Take 1 tablet (10 mg total) by mouth at bedtime as needed for sleep.  30 tablet  0  .  digoxin (LANOXIN) 0.125 MG tablet Take 1 tablet (0.125 mg total) by mouth daily.  30 tablet  6  . furosemide (LASIX) 40 MG tablet 1 tablet (total 40mg ) in the AM and 1/2 tablet (total 20mg ) in the PM  50 tablet  6    BP 110/62  Pulse 70  Ht 5\' 4"  (1.626 m)  Wt 139 lb (63.05 kg)  BMI 23.86 kg/m2 General: NAD Neck: JVP difficult, probably mildly elevated; no thyromegaly or thyroid nodule.  Lungs: Slight crackles at the bases bilaterally.  CV: Nondisplaced PMI.  Heart regular S1/S2, no S3/S4, 2/6 HSM at apex.  No edema.  No carotid bruit. Abdomen: Soft, nontender, no hepatosplenomegaly, no distention.  Neurologic: Alert and oriented x 3.  Psych: Normal affect. Extremities: No clubbing or cyanosis.   Assessment/Plan: 1. ATRIAL FIBRILLATION  Maintaining NSR on amiodarone at low dose. Continue amiodarone, Toprol XL, warfarin. Check TSH and LFTs today.  He will need yearly eye exams. His daughter asks about Xarelto.  GFR was 48 most recently and would need reduced dose, 15 mg daily.  He has been stable on warfarin.  Will see what his renal function does with increased Lasix and can discuss changing to Xarelto at next appointment.  2. HYPERLIPIDEMIA  He is tolerating pravastatin without myalgias. Had myalgias with Lipitor and Crestor.  Continue current statin dosing.  3. MITRAL INSUFFICIENCY Last echo showed moderate to severe MR. EF is 30-35%. Probably NYHA class II symptoms. Given age and medical condition, would plan medical management.  4. SYSTOLIC HEART FAILURE, CHRONIC  EF 30-35% on last echo, seems to be NYHA class II symptomatically but BP has been running low and he has been fatigued and lightheaded.  We have had to cut back on his cardiac meds, now on very low doses of lisinopril and Toprol XL. Mild volume overload on exam.  - Start digoxin 0.125 mg daily.  Will get digoxin level in 10 days.  - Increase Lasix to 40 qam, 20 qpm.  BMET/BNP in 10 days.  - Would avoid ICD at his age.  5.  Prolonged QT interval Will check K and Mg today.  He is on amiodarone at low dose, which can increase the QT interval.   Marca Ancona 09/10/2012

## 2012-09-11 ENCOUNTER — Encounter: Payer: Self-pay | Admitting: Cardiology

## 2012-09-11 ENCOUNTER — Telehealth: Payer: Self-pay | Admitting: Cardiology

## 2012-09-11 DIAGNOSIS — E039 Hypothyroidism, unspecified: Secondary | ICD-10-CM

## 2012-09-11 NOTE — Telephone Encounter (Signed)
New problem:  Test results.  

## 2012-09-11 NOTE — Telephone Encounter (Signed)
Spoke to patient's daughter she stated she looked at her fathers lab results on line and she was worried about elevated bnp.Daughter was told Dr.McLean increased patient's lasix which should help.TSH elevated and Dr.McLean advised to repeat tsh and obtain free t4,free t3.Daughter stated patient has appointment 09/19/12 for lab will have done then.

## 2012-09-15 ENCOUNTER — Encounter: Payer: Self-pay | Admitting: Cardiology

## 2012-09-15 ENCOUNTER — Other Ambulatory Visit: Payer: Self-pay | Admitting: *Deleted

## 2012-09-15 DIAGNOSIS — I5022 Chronic systolic (congestive) heart failure: Secondary | ICD-10-CM

## 2012-09-17 ENCOUNTER — Other Ambulatory Visit: Payer: Self-pay | Admitting: Cardiology

## 2012-09-19 ENCOUNTER — Ambulatory Visit (INDEPENDENT_AMBULATORY_CARE_PROVIDER_SITE_OTHER): Payer: Medicare Other | Admitting: *Deleted

## 2012-09-19 ENCOUNTER — Other Ambulatory Visit (INDEPENDENT_AMBULATORY_CARE_PROVIDER_SITE_OTHER): Payer: Medicare Other

## 2012-09-19 DIAGNOSIS — I4891 Unspecified atrial fibrillation: Secondary | ICD-10-CM

## 2012-09-19 DIAGNOSIS — I2510 Atherosclerotic heart disease of native coronary artery without angina pectoris: Secondary | ICD-10-CM

## 2012-09-19 DIAGNOSIS — E039 Hypothyroidism, unspecified: Secondary | ICD-10-CM

## 2012-09-19 DIAGNOSIS — I5022 Chronic systolic (congestive) heart failure: Secondary | ICD-10-CM

## 2012-09-19 LAB — T3, FREE: T3, Free: 2.3 pg/mL (ref 2.3–4.2)

## 2012-09-19 LAB — BASIC METABOLIC PANEL
BUN: 28 mg/dL — ABNORMAL HIGH (ref 6–23)
CO2: 26 mEq/L (ref 19–32)
Chloride: 98 mEq/L (ref 96–112)
Glucose, Bld: 101 mg/dL — ABNORMAL HIGH (ref 70–99)
Potassium: 4.1 mEq/L (ref 3.5–5.1)

## 2012-09-19 LAB — MAGNESIUM: Magnesium: 2.4 mg/dL (ref 1.5–2.5)

## 2012-09-22 ENCOUNTER — Telehealth: Payer: Self-pay | Admitting: Cardiology

## 2012-09-22 ENCOUNTER — Other Ambulatory Visit: Payer: Self-pay | Admitting: *Deleted

## 2012-09-22 DIAGNOSIS — I2510 Atherosclerotic heart disease of native coronary artery without angina pectoris: Secondary | ICD-10-CM

## 2012-09-22 DIAGNOSIS — I5022 Chronic systolic (congestive) heart failure: Secondary | ICD-10-CM

## 2012-09-22 NOTE — Telephone Encounter (Signed)
Pt calling re blood test results

## 2012-09-23 NOTE — Telephone Encounter (Signed)
New Problem:    Patient's daughter called in wanting to speak with you because her father has an irregular heartbeat today.  Please call back.

## 2012-09-23 NOTE — Telephone Encounter (Signed)
Pt checks his pulse regularly. Marcelle Smiling states pt's heart rate has always been slightly irregular--1 irregular beat every 50-60 beats. In the last few days his heart rate has been more irregular-1 irregular beat every 5-6 beats. Heart rate is in the high 50s range. BP is in the normal range.  Pt denies any other symptoms and is actually feeling better since office visit with Dr Shirlee Latch 09/10/12. I spoke with Marcelle Smiling yesterday and asked her to decrease pt's digoxin   to every other day per Dr Alford Highland recommendation. Pt has an appt with Dr Shirlee Latch 09/26/12. Marcelle Smiling is asking if there are any recommendations based on this information. I will forward to Dr Shirlee Latch for review.

## 2012-09-23 NOTE — Telephone Encounter (Signed)
Reviewed with Dr Shirlee Latch. Per Dr Era Skeen changes for now. Call if any changes. Keep appt already scheduled for 09/26/12. Pt's daughter, Marcelle Smiling advised, verbalized understanding.

## 2012-09-23 NOTE — Telephone Encounter (Signed)
Spoke with pt's daughter,Natasha.

## 2012-09-23 NOTE — Telephone Encounter (Signed)
F/u   Calling back to speak with nurse from this am message.

## 2012-09-26 ENCOUNTER — Ambulatory Visit (INDEPENDENT_AMBULATORY_CARE_PROVIDER_SITE_OTHER): Payer: Medicare Other | Admitting: Cardiology

## 2012-09-26 ENCOUNTER — Encounter: Payer: Self-pay | Admitting: Cardiology

## 2012-09-26 VITALS — BP 110/70 | HR 70 | Ht 64.0 in | Wt 137.0 lb

## 2012-09-26 DIAGNOSIS — I5022 Chronic systolic (congestive) heart failure: Secondary | ICD-10-CM

## 2012-09-26 DIAGNOSIS — I080 Rheumatic disorders of both mitral and aortic valves: Secondary | ICD-10-CM

## 2012-09-26 DIAGNOSIS — I4891 Unspecified atrial fibrillation: Secondary | ICD-10-CM

## 2012-09-26 DIAGNOSIS — E785 Hyperlipidemia, unspecified: Secondary | ICD-10-CM

## 2012-09-26 DIAGNOSIS — I2510 Atherosclerotic heart disease of native coronary artery without angina pectoris: Secondary | ICD-10-CM

## 2012-09-26 MED ORDER — METOPROLOL SUCCINATE ER 25 MG PO TB24: 25.0000 mg | ORAL_TABLET | Freq: Every day | ORAL | Status: DC

## 2012-09-26 NOTE — Patient Instructions (Addendum)
Your physician recommends that you schedule a follow-up appointment in: 1 month with dr Shirlee Latch  Your physician recommends that you return for lab work in: 10 days for digoxin level and bmet, DO NOT TAKE YOUR DIGOXIN TILL LAB IS DRAWN.  Your physician has recommended you make the following change in your medication:   INCREASE YOUR TOPROL XL FROM 12.5 MG DAILY  TO 25 MG ONCE A DAY.

## 2012-09-28 NOTE — Progress Notes (Signed)
Patient ID: [name], male   DOB: 02/24/1926, 79 y.o.   MRN: [id number] PCP: Dr. Milus Glazier  79 yo with history of CAD, ischemic CMP, CKD, and paroxysmal atrial fibrillation presents for cardiology followup. He speaks little [place] and his daughter interprets.  He has felt no sustained atrial fibrillation and is in NSR today.  No chest pain.  Patient has a history of myalgias with Crestor and Lipitor.  He has tolerated pravastatin without myalgias.    At last appointment, patient reported dyspnea and his blood pressure was running low.  I increased Lasix to 40 qam, 20 qpm.  I also started him on digoxin.  He seems to be feeling much better now.  Weight is down 2 lbs.  Breathing is better, he walks twice a day now for a total of 2 miles.  He continues to feel occasional palpitations.    Labs (9/11): K 4.1, creatinine 1.4  Labs (8/12): K 3.8, creatinine 1.3, TSH normal, LFTs normal, LDL 96, HDL 50 Labs (1/13): K 3.9, creatinine 1.3, TSH normal, LFTs normal Labs (2/13): K 3.8, creatinine 1.3 Labs (5/13): K 3.5, creatinine 1.4, BNP 331, LFTs normal, TSH normal Labs (9/13): LDL 85, HDL 53, BNP 547, LFTs normal Labs (12/13): K 4.3, creatinine 1.49 => 1.4, digoxin 1.4, TSH normal  Allergies (verified):  1) ! Crestor  2) Vytorin  3) Atenolol   Past Medical History:  1. Coronary artery disease: LHC 4/06 with 60% pLAD, 40% ramus, 60% mRCA, and 90% mPDA. No intervention. Inferior akinesis on LV-gram was suggestive of possible inferior MI with recanalization.  2. Hyperlipidemia: Myalgias with Lipitor and Crestor.  3. Hypothyroidism  4. Peripheral vascular disease  5. Anxiety  6. Skin cancer, hx of 2008 BCC  7. Colonic polyps, hx of  8. Paroxysmal atrial fibrillation with rapid ventricular response treated with amiodarone therapy converting to normal sinus rhythm 02/2009  9. Systolic CHF: Probable ischemic cardiomyopathy. Echo (6/10) with EF 40%, mildly dilated LV, moderate MR.  Echo (3/12): EF  30-35%, inferolateral severe hypokinesis, trivial AI, moderate to severe MR.  10. History of PNA  11. CKD. Baseline creatinine around 1.4.  12. Mitral regurgitation: Moderate by echo 6/10. 2-3+ by cath 2006.  Moderate-severe by echo in 3/12.  13. PVCs  14. BPH  Family History:  Family History of CAD Male 1st degree relative <60   Social History:  Retired  Originally from [place], speaks little English  Married  Former Smoker   Review of Systems:  All systems reviewed and negative except as per HPI.   Current Outpatient Prescriptions  Medication Sig Dispense Refill  . amiodarone (PACERONE) 200 MG tablet TAKE 1/2 TABLET BY MOUTH DAILY  30 tablet  5  . co-enzyme Q-10 30 MG capsule Take 200 mg by mouth daily.       . digoxin (LANOXIN) 0.125 MG tablet 1 every other day      . furosemide (LASIX) 40 MG tablet 1 tablet (total 40mg ) in the AM and 1/2 tablet (total 20mg ) in the PM  50 tablet  6  . KLOR-CON M20 20 MEQ tablet TAKE 1 TABLET (20 MEQ TOTAL) BY MOUTH DAILY.  90 tablet  1  . levothyroxine (SYNTHROID, LEVOTHROID) 100 MCG tablet Take 1 tablet (100 mcg total) by mouth daily.  90 tablet  3  . lisinopril (PRINIVIL,ZESTRIL) 2.5 MG tablet TAKE 1 TABLET (2.5 MG TOTAL) BY MOUTH DAILY.  30 tablet  6  . metoprolol succinate (TOPROL-XL) 25 MG 24 hr tablet  Take 1 tablet (25 mg total) by mouth daily. 1/2 tablet (total 12.5mg ) daily  30 tablet  6  . Omega-3 Fatty Acids (FISH OIL) 1200 MG CAPS Take by mouth 2 (two) times daily.        . pravastatin (PRAVACHOL) 80 MG tablet TAKE 1 TABLET (80 MG TOTAL) BY MOUTH DAILY.  90 tablet  1  . Tamsulosin HCl (FLOMAX) 0.4 MG CAPS Take 1 capsule (0.4 mg total) by mouth daily.  90 capsule  3  . warfarin (COUMADIN) 1 MG tablet Take 1-2 mg by mouth daily. Take 1 tablet on Tuesday and Friday then take 2 tablets the other days      . zolpidem (AMBIEN) 10 MG tablet Take 10 mg by mouth at bedtime as needed.        BP 110/70  Pulse 70  Ht 5\' 4"  (1.626 m)  Wt 137  lb (62.143 kg)  BMI 23.52 kg/m2  SpO2 98% General: NAD Neck: No JVD; no thyromegaly or thyroid nodule.  Lungs: Slight crackles at the bases bilaterally.  CV: Nondisplaced PMI.  Heart regular S1/S2, no S3/S4, 2/6 HSM at apex.  No edema.  No carotid bruit. Abdomen: Soft, nontender, no hepatosplenomegaly, no distention.  Neurologic: Alert and oriented x 3.  Psych: Normal affect. Extremities: No clubbing or cyanosis.   Assessment/Plan: 1. ATRIAL FIBRILLATION  Maintaining NSR on amiodarone at low dose. Continue amiodarone, Toprol XL, warfarin. TSH and LFTs normally.  He will need yearly eye exams.   2. HYPERLIPIDEMIA  He is tolerating pravastatin without myalgias. Had myalgias with Lipitor and Crestor.  Continue current statin dosing.  3. MITRAL INSUFFICIENCY Last echo showed moderate to severe MR. EF is 30-35%. Probably NYHA class II symptoms. Given age and medical condition, would plan medical management.  4. SYSTOLIC HEART FAILURE, CHRONIC  EF 30-35% on last echo. He is less short of breath now, NYHA class II symptoms. - Continue digoxin at lower dose (every other day) given mildly elevated level recently.  Will repeat digoxin level in 10 days.  - Continue lisinopril at current dose and increase Toprol XL to 25 mg daily.  This may help with palpitations. - Continue current dose of Lasix, would repeat BMET in 10 days.  - Would avoid ICD at his age.   Marca Ancona 09/28/2012

## 2012-10-06 ENCOUNTER — Other Ambulatory Visit (INDEPENDENT_AMBULATORY_CARE_PROVIDER_SITE_OTHER): Payer: Medicare Other

## 2012-10-06 ENCOUNTER — Other Ambulatory Visit: Payer: Self-pay | Admitting: *Deleted

## 2012-10-06 ENCOUNTER — Telehealth: Payer: Self-pay | Admitting: Cardiology

## 2012-10-06 ENCOUNTER — Other Ambulatory Visit: Payer: Medicare Other

## 2012-10-06 ENCOUNTER — Ambulatory Visit (INDEPENDENT_AMBULATORY_CARE_PROVIDER_SITE_OTHER): Payer: Medicare Other | Admitting: *Deleted

## 2012-10-06 DIAGNOSIS — I4891 Unspecified atrial fibrillation: Secondary | ICD-10-CM

## 2012-10-06 DIAGNOSIS — I5022 Chronic systolic (congestive) heart failure: Secondary | ICD-10-CM

## 2012-10-06 DIAGNOSIS — I2510 Atherosclerotic heart disease of native coronary artery without angina pectoris: Secondary | ICD-10-CM

## 2012-10-06 LAB — BASIC METABOLIC PANEL
BUN: 27 mg/dL — ABNORMAL HIGH (ref 6–23)
Calcium: 9 mg/dL (ref 8.4–10.5)
Calcium: 8.9 mg/dL (ref 8.4–10.5)
Creatinine, Ser: 1.5 mg/dL (ref 0.4–1.5)
GFR: 49.98 mL/min — ABNORMAL LOW (ref >60.00)
Potassium: 4.4 mEq/L (ref 3.5–5.1)
Sodium: 136 mEq/L (ref 135–145)

## 2012-10-06 LAB — POCT INR: INR: 2.5

## 2012-10-06 MED ORDER — POTASSIUM CHLORIDE CRYS ER 20 MEQ PO TBCR: 20.0000 meq | EXTENDED_RELEASE_TABLET | Freq: Every day | ORAL | Status: DC

## 2012-10-06 NOTE — Telephone Encounter (Signed)
Spoke with pt's daughter,Natasha.  

## 2012-10-06 NOTE — Telephone Encounter (Signed)
New Problem:    Patient called in wanting to speak with you about his medications, especially about his coumadin.  Please call back.

## 2012-10-07 LAB — DIGOXIN LEVEL: Digoxin Level: 1 ng/mL (ref 0.8–2.0)

## 2012-10-16 ENCOUNTER — Other Ambulatory Visit: Payer: Self-pay | Admitting: Family Medicine

## 2012-10-16 DIAGNOSIS — N400 Enlarged prostate without lower urinary tract symptoms: Secondary | ICD-10-CM

## 2012-10-16 MED ORDER — TAMSULOSIN HCL 0.4 MG PO CAPS: 0.4000 mg | ORAL_CAPSULE | Freq: Every day | ORAL | Status: DC

## 2012-10-27 ENCOUNTER — Ambulatory Visit (INDEPENDENT_AMBULATORY_CARE_PROVIDER_SITE_OTHER): Payer: Medicare Other | Admitting: Cardiology

## 2012-10-27 ENCOUNTER — Ambulatory Visit (INDEPENDENT_AMBULATORY_CARE_PROVIDER_SITE_OTHER): Payer: Medicare Other

## 2012-10-27 ENCOUNTER — Encounter: Payer: Self-pay | Admitting: Cardiology

## 2012-10-27 VITALS — BP 110/60 | HR 60 | Ht 64.0 in | Wt 139.0 lb

## 2012-10-27 DIAGNOSIS — I4891 Unspecified atrial fibrillation: Secondary | ICD-10-CM

## 2012-10-27 DIAGNOSIS — E785 Hyperlipidemia, unspecified: Secondary | ICD-10-CM

## 2012-10-27 DIAGNOSIS — R0602 Shortness of breath: Secondary | ICD-10-CM

## 2012-10-27 DIAGNOSIS — I2510 Atherosclerotic heart disease of native coronary artery without angina pectoris: Secondary | ICD-10-CM

## 2012-10-27 DIAGNOSIS — I5022 Chronic systolic (congestive) heart failure: Secondary | ICD-10-CM

## 2012-10-27 LAB — POCT INR: INR: 2.7

## 2012-10-27 MED ORDER — LISINOPRIL 5 MG PO TABS: 5.0000 mg | ORAL_TABLET | Freq: Every day | ORAL | Status: DC

## 2012-10-27 NOTE — Patient Instructions (Addendum)
Increase lisinopril to 5mg  daily. You can take 2 of your 2.5mg  tablets daily at the same time and use your current upply.  Your physician recommends that you return for lab work in: 2 weeks--BMET/BNP/Digoxin level  Your physician recommends that you schedule a follow-up appointment in: 3 months with Dr Shirlee Latch. (May 2014).

## 2012-10-27 NOTE — Progress Notes (Signed)
Patient ID: [name], male   DOB: 1926-01-26, 79 y.o.   MRN: [id number] PCP: Dr. Milus Glazier  79 yo with history of CAD, ischemic CMP, CKD, and paroxysmal atrial fibrillation presents for cardiology followup. He speaks little [place] and his daughter interprets.  He has felt no sustained atrial fibrillation and is in NSR today.  No chest pain.  Patient has a history of myalgias with Crestor and Lipitor.  He has tolerated pravastatin without myalgias.    He seems to be doing well overall.  Weight has been stable on current Lasix dose.  He walks 2 miles most days without exertional dyspnea.  He gets occasional sharp chest pain that lasts for only seconds at a time and is not exertional.  He denies lightheadedness.   Labs (9/11): K 4.1, creatinine 1.4  Labs (8/12): K 3.8, creatinine 1.3, TSH normal, LFTs normal, LDL 96, HDL 50 Labs (1/13): K 3.9, creatinine 1.3, TSH normal, LFTs normal Labs (2/13): K 3.8, creatinine 1.3 Labs (5/13): K 3.5, creatinine 1.4, BNP 331, LFTs normal, TSH normal Labs (9/13): LDL 85, HDL 53, BNP 547, LFTs normal Labs (12/13): K 4.3, creatinine 1.49 => 1.4, digoxin 1.4, TSH normal, LFTs normal Labs (1/14): digoxin 1.0, K 4.3, creatinine 1.5  Allergies (verified):  1) ! Crestor  2) Vytorin  3) Atenolol   Past Medical History:  1. Coronary artery disease: LHC 4/06 with 60% pLAD, 40% ramus, 60% mRCA, and 90% mPDA. No intervention. Inferior akinesis on LV-gram was suggestive of possible inferior MI with recanalization.  2. Hyperlipidemia: Myalgias with Lipitor and Crestor.  3. Hypothyroidism  4. Peripheral vascular disease  5. Anxiety  6. Skin cancer, hx of 2008 BCC  7. Colonic polyps, hx of  8. Paroxysmal atrial fibrillation with rapid ventricular response treated with amiodarone therapy converting to normal sinus rhythm 02/2009  9. Systolic CHF: Probable ischemic cardiomyopathy. Echo (6/10) with EF 40%, mildly dilated LV, moderate MR.  Echo (3/12): EF 30-35%,  inferolateral severe hypokinesis, trivial AI, moderate to severe MR.  10. History of PNA  11. CKD. Baseline creatinine around 1.4.  12. Mitral regurgitation: Moderate by echo 6/10. 2-3+ by cath 2006.  Moderate-severe by echo in 3/12.  13. PVCs  14. BPH  Family History:  Family History of CAD Male 1st degree relative <60   Social History:  Retired  Originally from [place], speaks little English  Married  Former Smoker   Review of Systems:  All systems reviewed and negative except as per HPI.   Current Outpatient Prescriptions  Medication Sig Dispense Refill  . amiodarone (PACERONE) 200 MG tablet TAKE 1/2 TABLET BY MOUTH DAILY  30 tablet  5  . co-enzyme Q-10 30 MG capsule Take 200 mg by mouth daily.       . digoxin (LANOXIN) 0.125 MG tablet 1 every other day      . furosemide (LASIX) 40 MG tablet 1 tablet (total 40mg ) in the AM and 1/2 tablet (total 20mg ) in the PM  50 tablet  6  . levothyroxine (SYNTHROID, LEVOTHROID) 100 MCG tablet Take 1 tablet (100 mcg total) by mouth daily.  90 tablet  3  . metoprolol succinate (TOPROL-XL) 25 MG 24 hr tablet 1/2 tablet (total 12.5mg ) two times a day      . Omega-3 Fatty Acids (FISH OIL) 1200 MG CAPS Take by mouth 2 (two) times daily.        . potassium chloride SA (KLOR-CON M20) 20 MEQ tablet Take 1 tablet (20 mEq total)  by mouth daily.  30 tablet  6  . pravastatin (PRAVACHOL) 80 MG tablet TAKE 1 TABLET (80 MG TOTAL) BY MOUTH DAILY.  90 tablet  1  . Tamsulosin HCl (FLOMAX) 0.4 MG CAPS Take 1 capsule (0.4 mg total) by mouth daily.  90 capsule  3  . warfarin (COUMADIN) 1 MG tablet Take 1-2 mg by mouth daily. Take 1 tablet on Tuesday and Friday then take 2 tablets the other days      . zolpidem (AMBIEN) 10 MG tablet Take 10 mg by mouth at bedtime as needed.      Marland Kitchen lisinopril (PRINIVIL,ZESTRIL) 5 MG tablet Take 1 tablet (5 mg total) by mouth daily.  90 tablet  3    BP 110/60  Pulse 60  Ht 5\' 4"  (1.626 m)  Wt 139 lb (63.05 kg)  BMI 23.86 kg/m2   SpO2 96% General: NAD Neck: No JVD; no thyromegaly or thyroid nodule.  Lungs: Slight crackles at the bases bilaterally.  CV: Nondisplaced PMI.  Heart regular S1/S2, no S3/S4, 2/6 HSM at apex.  No edema.  No carotid bruit. Abdomen: Soft, nontender, no hepatosplenomegaly, no distention.  Neurologic: Alert and oriented x 3.  Psych: Normal affect. Extremities: No clubbing or cyanosis.   Assessment/Plan: 1. ATRIAL FIBRILLATION  Maintaining NSR on amiodarone at low dose. Continue amiodarone, Toprol XL, warfarin. TSH and LFTs normal in 12/13.  He will need yearly eye exams.   2. HYPERLIPIDEMIA  He is tolerating pravastatin without myalgias. Had myalgias with Lipitor and Crestor.  Continue current statin dosing.  3. MITRAL INSUFFICIENCY Last echo showed moderate to severe MR. EF is 30-35%. Probably NYHA class II symptoms. Given age and medical condition, would plan medical management.  4. SYSTOLIC HEART FAILURE, CHRONIC  EF 30-35% on last echo. He is doing well generally, NYHA class II symptoms.  Uptitration of meds has been difficult due to orthostatic-type symptoms.  - Continue digoxin every other day.  Will repeat level with next set of labs. - Continue current dose of Toprol XL.  - I will increase lisinopril to 5 mg daily today with BMET/BNP in 10 days.  - Continue current dose of Lasix.  - Would avoid ICD at his age.   Marca Ancona 10/27/2012

## 2012-10-28 ENCOUNTER — Telehealth: Payer: Self-pay | Admitting: Cardiology

## 2012-10-28 NOTE — Telephone Encounter (Signed)
Spoke with pt's daughter and told her Toprol dose remains the same and Lisinopril was increased to 5 mg daily. She is aware of upcoming lab work.

## 2012-10-28 NOTE — Telephone Encounter (Signed)
New Problem:    Patient's daughter called in because her father was seen yesterday by Dr. Shirlee Latch and had his metoprolol succinate (TOPROL-XL) 25 MG 24 hr tablet increased to 50mg  but in all of his paperwork it has remained at 25mg .  Would like to know which would be the proper dose.  Please call back.

## 2012-10-29 ENCOUNTER — Ambulatory Visit: Payer: Medicare Other | Admitting: Cardiology

## 2012-11-07 ENCOUNTER — Other Ambulatory Visit: Payer: Medicare Other

## 2012-11-10 ENCOUNTER — Other Ambulatory Visit: Payer: Medicare Other

## 2012-11-10 ENCOUNTER — Other Ambulatory Visit (INDEPENDENT_AMBULATORY_CARE_PROVIDER_SITE_OTHER): Payer: Medicare Other

## 2012-11-10 DIAGNOSIS — I5022 Chronic systolic (congestive) heart failure: Secondary | ICD-10-CM

## 2012-11-10 DIAGNOSIS — R0602 Shortness of breath: Secondary | ICD-10-CM

## 2012-11-10 LAB — BASIC METABOLIC PANEL
BUN: 19 mg/dL (ref 6–23)
Chloride: 104 mEq/L (ref 96–112)
Potassium: 3.9 mEq/L (ref 3.5–5.1)

## 2012-11-10 LAB — BRAIN NATRIURETIC PEPTIDE: Pro B Natriuretic peptide (BNP): 163 pg/mL — ABNORMAL HIGH (ref 0.0–100.0)

## 2012-11-11 ENCOUNTER — Telehealth: Payer: Self-pay | Admitting: Cardiology

## 2012-11-11 NOTE — Telephone Encounter (Signed)
Spoke with [name] 

## 2012-11-11 NOTE — Telephone Encounter (Signed)
New problem:  Test results.  

## 2012-11-13 ENCOUNTER — Ambulatory Visit: Payer: Medicare Other | Admitting: *Deleted

## 2012-11-13 ENCOUNTER — Ambulatory Visit (INDEPENDENT_AMBULATORY_CARE_PROVIDER_SITE_OTHER): Payer: Medicare Other

## 2012-11-13 DIAGNOSIS — I4891 Unspecified atrial fibrillation: Secondary | ICD-10-CM

## 2012-11-14 LAB — DIGOXIN LEVEL: Digoxin Level: 0.4 ng/mL — ABNORMAL LOW (ref 0.8–2.0)

## 2012-11-17 ENCOUNTER — Telehealth: Payer: Self-pay | Admitting: Cardiology

## 2012-11-17 NOTE — Telephone Encounter (Signed)
Spoke with Marcelle Smiling about recent lab results.

## 2012-11-17 NOTE — Telephone Encounter (Signed)
Pt's dtr calling re lab results and dosage of digoxin pt to take

## 2012-11-24 ENCOUNTER — Ambulatory Visit (INDEPENDENT_AMBULATORY_CARE_PROVIDER_SITE_OTHER): Payer: Medicare Other

## 2012-11-24 DIAGNOSIS — I4891 Unspecified atrial fibrillation: Secondary | ICD-10-CM

## 2012-11-24 LAB — POCT INR: INR: 2.6

## 2012-11-24 MED ORDER — WARFARIN SODIUM 1 MG PO TABS: ORAL_TABLET | ORAL | Status: DC

## 2012-12-03 ENCOUNTER — Other Ambulatory Visit: Payer: Self-pay | Admitting: Family Medicine

## 2012-12-03 ENCOUNTER — Telehealth: Payer: Self-pay | Admitting: Radiology

## 2012-12-03 DIAGNOSIS — I4891 Unspecified atrial fibrillation: Secondary | ICD-10-CM

## 2012-12-03 DIAGNOSIS — E039 Hypothyroidism, unspecified: Secondary | ICD-10-CM

## 2012-12-03 DIAGNOSIS — G4700 Insomnia, unspecified: Secondary | ICD-10-CM

## 2012-12-03 MED ORDER — LEVOTHYROXINE SODIUM 100 MCG PO TABS: 100.0000 ug | ORAL_TABLET | Freq: Every day | ORAL | Status: DC

## 2012-12-03 MED ORDER — ZOLPIDEM TARTRATE 10 MG PO TABS: 10.0000 mg | ORAL_TABLET | Freq: Every evening | ORAL | Status: DC | PRN

## 2012-12-03 MED ORDER — METOPROLOL SUCCINATE ER 25 MG PO TB24: ORAL_TABLET | ORAL | Status: DC

## 2012-12-03 NOTE — Telephone Encounter (Signed)
Did you see this patient? His thyroid medication was sent in for one month for him when his daughter called, upset medication was not sent in at time of office visit. If you did see him, we need documentation of this. Please advise. [name]

## 2012-12-19 ENCOUNTER — Encounter: Payer: Self-pay | Admitting: Cardiology

## 2012-12-20 ENCOUNTER — Ambulatory Visit: Payer: Medicare Other

## 2012-12-20 ENCOUNTER — Encounter: Payer: Self-pay | Admitting: Family Medicine

## 2012-12-20 ENCOUNTER — Ambulatory Visit (INDEPENDENT_AMBULATORY_CARE_PROVIDER_SITE_OTHER): Payer: Medicare Other | Admitting: Family Medicine

## 2012-12-20 VITALS — BP 119/69 | HR 89 | Temp 97.5°F | Resp 18 | Ht 64.0 in | Wt 140.0 lb

## 2012-12-20 DIAGNOSIS — L851 Acquired keratosis [keratoderma] palmaris et plantaris: Secondary | ICD-10-CM

## 2012-12-20 DIAGNOSIS — L570 Actinic keratosis: Secondary | ICD-10-CM

## 2012-12-20 DIAGNOSIS — R000 Tachycardia, unspecified: Secondary | ICD-10-CM

## 2012-12-20 LAB — POCT CBC
Granulocyte percent: 73.7 %G (ref 37–80)
HCT, POC: 41.9 % — AB (ref 43.5–53.7)
Hemoglobin: 13.7 g/dL — AB (ref 14.1–18.1)
Lymph, poc: 1.7 (ref 0.6–3.4)
MCH, POC: 30.5 pg (ref 27–31.2)
MCHC: 32.7 g/dL (ref 31.8–35.4)
MCV: 93.4 fL (ref 80–97)
MID (cbc): 0.7 (ref 0–0.9)
MPV: 8.6 fL (ref 0–99.8)
POC Granulocyte: 6.8 (ref 2–6.9)
POC LYMPH PERCENT: 19 %L (ref 10–50)
POC MID %: 7.3 %M (ref 0–12)
Platelet Count, POC: 184 10*3/uL (ref 142–424)
RBC: 4.49 M/uL — AB (ref 4.69–6.13)
RDW, POC: 16.4 %
WBC: 9.2 10*3/uL (ref 4.6–10.2)

## 2012-12-20 LAB — COMPREHENSIVE METABOLIC PANEL
ALT: 21 U/L (ref 0–53)
AST: 29 U/L (ref 0–37)
Albumin: 4 g/dL (ref 3.5–5.2)
Alkaline Phosphatase: 47 U/L (ref 39–117)
BUN: 24 mg/dL — ABNORMAL HIGH (ref 6–23)
CO2: 26 mEq/L (ref 19–32)
Calcium: 9.3 mg/dL (ref 8.4–10.5)
Chloride: 101 mEq/L (ref 96–112)
Creat: 1.81 mg/dL — ABNORMAL HIGH (ref 0.50–1.35)
Glucose, Bld: 111 mg/dL — ABNORMAL HIGH (ref 70–99)
Potassium: 4.2 mEq/L (ref 3.5–5.3)
Sodium: 137 mEq/L (ref 135–145)
Total Bilirubin: 0.7 mg/dL (ref 0.3–1.2)
Total Protein: 7.3 g/dL (ref 6.0–8.3)

## 2012-12-20 LAB — T4, FREE: Free T4: 1.75 ng/dL (ref 0.80–1.80)

## 2012-12-20 NOTE — Progress Notes (Signed)
Is an 79 year old gentleman who is been feeling fine. Last Wednesday a visiting nurse came to check on his blood pressure and pulse and found it to be his usual numbers with pulse in the 55-60 range and regular. Nevertheless over the last 3 days pulse has increased to about 90. Family watches over the patient with excruciating detail and they are alarmed with the pulse increase.  They note that the patient has been walking his usual amounts lately, has not had shortness of breath or chest pain, and is being normally. The only changes a note are that he fell one week ago and bumped his right side without causing much pain but leaving a large bruise. The other changes that his amiodarone with switch from trade name to generic about one month ago.  Also patient is concerned about new skin growths on his arms and on his right cheek. He's been developing over a period of months and her you're taking the patient. He wants to make sure they're not cancer.  Objective: Patient looks tired but is alert and cooperative. He is accompanied by his wife, his daughter, and son-in-law. They're all very anxious.  HEENT: Unremarkable Chest: Bibasilar rales on inspiration Heart: Regular with a 1/6 systolic ejection type murmur Abdomen: Soft and nontender Examination of the skin reveals a large ecchymosis on the right flank overlying the lower right lateral ribs. Skin: Several keratoses are noted on the right and left forearms. They were treated with liquid nitrogen without problem.  His recent labs several months ago revealed normal thyroid level. In addition his digoxin level was normal in January.  [facility] reading (PRIMARY) by  Dr. Milus Glazier  No acute change EKG  Atrial fibrillation with regular rhythm  Assessment: Increased pulse rate which is been alarmed patient's in any reassurance. I think this increase may be related to using generic amiodarone so we are going to go back to the trade name amiodarone. In  addition he has the actinic keratoses which were treated today.  Plan: Check electrolytes and thyroid Family we'll keep and I on the pulse but not to close and I don't worry too much and if the pulse remains elevated or worsens, we'll have a cardiologist advised. I've asked him to check with Dr. Everlene Farrier about the facial lesions appear.

## 2012-12-21 ENCOUNTER — Encounter: Payer: Self-pay | Admitting: Family Medicine

## 2012-12-22 ENCOUNTER — Telehealth: Payer: Self-pay | Admitting: Cardiology

## 2012-12-22 ENCOUNTER — Telehealth: Payer: Self-pay | Admitting: Radiology

## 2012-12-22 DIAGNOSIS — Z5181 Encounter for therapeutic drug level monitoring: Secondary | ICD-10-CM

## 2012-12-22 DIAGNOSIS — R899 Unspecified abnormal finding in specimens from other organs, systems and tissues: Secondary | ICD-10-CM

## 2012-12-22 DIAGNOSIS — I4891 Unspecified atrial fibrillation: Secondary | ICD-10-CM

## 2012-12-22 DIAGNOSIS — I2510 Atherosclerotic heart disease of native coronary artery without angina pectoris: Secondary | ICD-10-CM

## 2012-12-22 NOTE — Telephone Encounter (Signed)
Fine to do BMET and digoxin level tomorrow.  His ECG at Dr Lauenstein's office showed NSR with 1st degree AVB.  Would get another ECG tomorrow.  If he does not feel any different, not sure I would change much with HR running in 70s versus in 50s.

## 2012-12-22 NOTE — Telephone Encounter (Signed)
Can you also check his Digoxin level tomorrow? Lab order placed for CMET. Daughter concerned about his Digoxin level.

## 2012-12-22 NOTE — Telephone Encounter (Signed)
Spoke to patient's daughter Marcelle Smiling was told Dr.McLean advised having a EKG tomorrow 12/23/12.States patient feels okay just was concerned about increase in heart rate.Daughter was told if heart rate running in the 70's Dr.McLean may not change medications.Appointment scheduled 12/23/12 for EKG.

## 2012-12-22 NOTE — Telephone Encounter (Signed)
Spoke to patient's daughter Marcelle Smiling she stated she is concerned about father's pulse.Stated pulse rate is normally 55 to 60 beats/min.Stated Friday 12/19/12 pulse 75 to 85 beats/min.Stated father has not missed taking his medication. Stated took father to urgent care Saturday 12/21/12 and a ekg,cxr,lab work done.Dr.Lauenstein advised to take brand name pacerone and kidney functions slightly elevated.Advised repeating lab work this week.Daughter wanted to bring father to lab tomorrow 12/24/12 since he is already coming for INR check.Daughter also wants father to have digoxin level,bmet done.Wants to know what needs to be done about father's elevated pulse. Appointment already scheduled with Dr.McLean 01/26/13.Message sent to Dr.McLean.

## 2012-12-22 NOTE — Telephone Encounter (Signed)
Please add digoxin level to patient's lab panel when CMET is rechecked

## 2012-12-22 NOTE — Telephone Encounter (Signed)
New Problem:    Patient's daughter called in because, while on his medication, her father's HR last Friday was around 75-85 and went to see his PCP at urgent care on saturday.  Would like to consult with someone about whether or not her father needs to be see as soon as possible.  Please call back.

## 2012-12-23 ENCOUNTER — Ambulatory Visit (INDEPENDENT_AMBULATORY_CARE_PROVIDER_SITE_OTHER): Payer: Medicare Other

## 2012-12-23 ENCOUNTER — Encounter: Payer: Self-pay | Admitting: Cardiovascular Disease

## 2012-12-23 ENCOUNTER — Ambulatory Visit (INDEPENDENT_AMBULATORY_CARE_PROVIDER_SITE_OTHER): Payer: Medicare Other | Admitting: *Deleted

## 2012-12-23 ENCOUNTER — Other Ambulatory Visit (INDEPENDENT_AMBULATORY_CARE_PROVIDER_SITE_OTHER): Payer: Medicare Other

## 2012-12-23 VITALS — BP 114/66 | HR 92 | Ht 64.0 in | Wt 139.0 lb

## 2012-12-23 DIAGNOSIS — I4891 Unspecified atrial fibrillation: Secondary | ICD-10-CM

## 2012-12-23 DIAGNOSIS — I2510 Atherosclerotic heart disease of native coronary artery without angina pectoris: Secondary | ICD-10-CM

## 2012-12-23 LAB — BASIC METABOLIC PANEL
Calcium: 9.1 mg/dL (ref 8.4–10.5)
Creatinine, Ser: 1.8 mg/dL — ABNORMAL HIGH (ref 0.4–1.5)
GFR: 37.82 mL/min — ABNORMAL LOW (ref >60.00)
Sodium: 137 mEq/L (ref 135–145)

## 2012-12-23 LAB — POCT INR: INR: 2.3

## 2012-12-23 NOTE — Telephone Encounter (Signed)
Order placed called daughter to advise.

## 2012-12-23 NOTE — Progress Notes (Signed)
Pt arrives in office for an EKG per Dr. Shirlee Latch.  Pt and his family are concerned that the pts HR is too high at 90. EKG obtained and given to Dr. Elease Hashimoto, DOD, for his review and recommendations.  Per Dr. Elease Hashimoto ok to wait until Dr. Shirlee Latch can review EKG when he is in office tomorrow.  Pt and family given reassurance and advised that Dr. Shirlee Latch will be in office tomorrow and that someone from this office will contact them with his recommendations if any, pt's translator verbalized that pt understands.

## 2012-12-24 ENCOUNTER — Telehealth: Payer: Self-pay | Admitting: Cardiology

## 2012-12-24 DIAGNOSIS — I5022 Chronic systolic (congestive) heart failure: Secondary | ICD-10-CM

## 2012-12-24 DIAGNOSIS — I2510 Atherosclerotic heart disease of native coronary artery without angina pectoris: Secondary | ICD-10-CM

## 2012-12-24 NOTE — Telephone Encounter (Signed)
Follow Up   Following up on blood work. Would like to speak to nurse.

## 2012-12-24 NOTE — Telephone Encounter (Signed)
Spoke with Marcelle Smiling about pt's recent lab results.

## 2012-12-29 ENCOUNTER — Ambulatory Visit (INDEPENDENT_AMBULATORY_CARE_PROVIDER_SITE_OTHER): Payer: Medicare Other | Admitting: Cardiology

## 2012-12-29 ENCOUNTER — Encounter: Payer: Self-pay | Admitting: Cardiology

## 2012-12-29 ENCOUNTER — Ambulatory Visit (INDEPENDENT_AMBULATORY_CARE_PROVIDER_SITE_OTHER): Payer: Medicare Other | Admitting: *Deleted

## 2012-12-29 VITALS — BP 114/64 | Resp 20 | Ht 66.0 in | Wt 140.0 lb

## 2012-12-29 DIAGNOSIS — Z01812 Encounter for preprocedural laboratory examination: Secondary | ICD-10-CM

## 2012-12-29 DIAGNOSIS — I4892 Unspecified atrial flutter: Secondary | ICD-10-CM

## 2012-12-29 DIAGNOSIS — I4891 Unspecified atrial fibrillation: Secondary | ICD-10-CM

## 2012-12-29 DIAGNOSIS — E785 Hyperlipidemia, unspecified: Secondary | ICD-10-CM

## 2012-12-29 DIAGNOSIS — I5022 Chronic systolic (congestive) heart failure: Secondary | ICD-10-CM

## 2012-12-29 LAB — CBC WITH DIFFERENTIAL/PLATELET
Basophils Relative: 0.1 % (ref 0.0–3.0)
Eosinophils Relative: 0.1 % (ref 0.0–5.0)
HCT: 37.3 % — ABNORMAL LOW (ref 39.0–52.0)
Hemoglobin: 12.8 g/dL — ABNORMAL LOW (ref 13.0–17.0)
Lymphs Abs: 1.6 10*3/uL (ref 0.7–4.0)
MCHC: 34.4 g/dL (ref 30.0–36.0)
MCV: 89.7 fl (ref 78.0–100.0)
Monocytes Absolute: 1 10*3/uL (ref 0.1–1.0)
Neutro Abs: 6.3 10*3/uL (ref 1.4–7.7)
Neutrophils Relative %: 70.5 % (ref 43.0–77.0)
RBC: 4.17 Mil/uL — ABNORMAL LOW (ref 4.22–5.81)
WBC: 8.9 10*3/uL (ref 4.5–10.5)

## 2012-12-29 LAB — BASIC METABOLIC PANEL
CO2: 27 mEq/L (ref 19–32)
Chloride: 102 mEq/L (ref 96–112)
Potassium: 3.8 mEq/L (ref 3.5–5.1)
Sodium: 137 mEq/L (ref 135–145)

## 2012-12-29 LAB — POCT INR: INR: 2.4

## 2012-12-29 NOTE — Progress Notes (Signed)
Patient ID: [name], male   DOB: Feb 11, 1926, 79 y.o.   MRN: [id number] PCP: Dr. Milus Glazier  79 yo with history of CAD, ischemic CMP, CKD, and paroxysmal atrial fibrillation presents for cardiology followup. He speaks little [place] and his daughter interprets.  No chest pain.  Patient has a history of myalgias with Crestor and Lipitor.  He has tolerated pravastatin without myalgias.  He walks 2 miles most days without exertional dyspnea.  He denies lightheadedness.   Recently, he has noted that his heart rate has been higher than baseline.  Initially, it was in the 80s-90s.  Today, it was in the 100s-140s.  He was here today for a coumadin check so was added onto my schedule.  He has not noted any new dyspnea or chest pain, but he has noted palpitations, especially today.  They make him feel "bad."  ECG today showed atypical atrial flutter with rate 105.  At last appointment, he was in NSR.    ECG: Atypical atrial flutter at 105 bpm (2:1 block).   Labs (9/11): K 4.1, creatinine 1.4  Labs (8/12): K 3.8, creatinine 1.3, TSH normal, LFTs normal, LDL 96, HDL 50 Labs (1/13): K 3.9, creatinine 1.3, TSH normal, LFTs normal Labs (2/13): K 3.8, creatinine 1.3 Labs (5/13): K 3.5, creatinine 1.4, BNP 331, LFTs normal, TSH normal Labs (9/13): LDL 85, HDL 53, BNP 547, LFTs normal Labs (12/13): K 4.3, creatinine 1.49 => 1.4, digoxin 1.4, TSH normal, LFTs normal Labs (1/14): digoxin 1.0, K 4.3, creatinine 1.5 Labs (3/14): free T4 normal Labs (4/14): K 5, creatinine 1.8, digoxin 0.6  Allergies (verified):  1) ! Crestor  2) Vytorin  3) Atenolol   Past Medical History:  1. Coronary artery disease: LHC 4/06 with 60% pLAD, 40% ramus, 60% mRCA, and 90% mPDA. No intervention. Inferior akinesis on LV-gram was suggestive of possible inferior MI with recanalization.  2. Hyperlipidemia: Myalgias with Lipitor and Crestor.  3. Hypothyroidism  4. Peripheral vascular disease  5. Anxiety  6. Skin cancer, hx of  2008 BCC  7. Colonic polyps, hx of  8. Paroxysmal atrial fibrillation with rapid ventricular response treated with amiodarone therapy converting to normal sinus rhythm 02/2009.  He has also had atypical atrial flutter noted in 4/14.  9. Systolic CHF: Probable ischemic cardiomyopathy. Echo (6/10) with EF 40%, mildly dilated LV, moderate MR.  Echo (3/12): EF 30-35%, inferolateral severe hypokinesis, trivial AI, moderate to severe MR.  10. History of PNA  11. CKD. Baseline creatinine around 1.4.  12. Mitral regurgitation: Moderate by echo 6/10. 2-3+ by cath 2006.  Moderate-severe by echo in 3/12.  13. PVCs  14. BPH  Family History:  Family History of CAD Male 1st degree relative <60   Social History:  Retired  Originally from [place], speaks little English  Married  Former Smoker   Review of Systems:  All systems reviewed and negative except as per HPI.   Current Outpatient Prescriptions  Medication Sig Dispense Refill  . amiodarone (PACERONE) 200 MG tablet TAKE 1/2 TABLET BY MOUTH DAILY  30 tablet  5  . co-enzyme Q-10 30 MG capsule Take 100 mg by mouth daily.       . digoxin (LANOXIN) 0.125 MG tablet 1 every other day      . furosemide (LASIX) 40 MG tablet 1 tablet (total 40mg ) in the AM      . levothyroxine (SYNTHROID, LEVOTHROID) 100 MCG tablet Take 1 tablet (100 mcg total) by mouth daily.  30 tablet  11  . lisinopril (PRINIVIL,ZESTRIL) 5 MG tablet Take 1 tablet (5 mg total) by mouth daily.  90 tablet  3  . metoprolol succinate (TOPROL-XL) 25 MG 24 hr tablet 1/2 tablet (total 12.5mg ) two times a day  30 tablet  11  . Multiple Vitamin (MULTIVITAMIN) tablet Take 1 tablet by mouth daily.      . Omega-3 Fatty Acids (FISH OIL) 1200 MG CAPS Take 1 capsule by mouth daily.       . pravastatin (PRAVACHOL) 80 MG tablet TAKE 1 TABLET (80 MG TOTAL) BY MOUTH DAILY.  90 tablet  1  . Tamsulosin HCl (FLOMAX) 0.4 MG CAPS Take 1 capsule (0.4 mg total) by mouth daily.  90 capsule  3  . warfarin  (COUMADIN) 1 MG tablet Take as directed by [facility]  60 tablet  3  . zolpidem (AMBIEN) 10 MG tablet Take 1 tablet (10 mg total) by mouth at bedtime as needed.  30 tablet  5   No current facility-administered medications for this visit.    BP 114/64  Resp 20  Ht 5\' 6"  (1.676 m)  Wt 140 lb (63.504 kg)  BMI 22.61 kg/m2 General: NAD Neck: No JVD; no thyromegaly or thyroid nodule.  Lungs: Slight crackles at the bases bilaterally.  CV: Nondisplaced PMI.  Heart mildly tachy, regular S1/S2, no S3/S4, 1/6 HSM at apex.  No edema.  No carotid bruit. Abdomen: Soft, nontender, no hepatosplenomegaly, no distention.  Neurologic: Alert and oriented x 3.  Psych: Normal affect. Extremities: No clubbing or cyanosis.   Assessment/Plan: 1. ATRIAL FLUTTER  Atypical atrial flutter with 2:1 block and HR 105 noted today.  He has noted a higher heart rate for several days.  Given his CHF, I think that he would benefit from restoration of NSR.  He is already on amiodarone given prior history of paroxysmal atrial fibrillation.  - INRs reviewed, they have been therapeutic > 2 months.  Will arrange for DCCV on Friday if he remains in atrial flutter.  He will come by the office for point of care INR and ECG to make sure he is still in atrial flutter before going to hospital for DCCV.  - Increase amiodarone to 200 mg bid until cardioversion.  Will likely maintain him on a higher chronic dose of amiodarone, probably 200 mg daily.  - Continue warfarin.   2. HYPERLIPIDEMIA  He is tolerating pravastatin without myalgias. Had myalgias with Lipitor and Crestor.  Continue current statin dosing.  3. MITRAL INSUFFICIENCY Last echo showed moderate to severe MR. EF is 30-35%. Probably NYHA class II symptoms. Given age and medical condition, would plan medical management.  4. SYSTOLIC HEART FAILURE, CHRONIC  EF 30-35% on last echo. Breathing is stable, NYHA class II symptoms.  Uptitration of meds has been difficult due to  orthostatic-type symptoms.  - Continue digoxin every other day.  Digoxin level has been ok.  - Continue current dose of Toprol XL and lisinopril.  - Continue current dose of Lasix.  - K and creatinine were higher when labs were last done, will repeat BMET today.  - Would avoid ICD at his age.   Marca Ancona 12/29/2012

## 2012-12-29 NOTE — Patient Instructions (Addendum)
Your cardioversion is scheduled for   Your physician recommends that you return for lab work in: today (bmet, cbc)  You are scheduled for an ekg at our office on 04/03/13 at 11:00am.

## 2012-12-30 ENCOUNTER — Telehealth: Payer: Self-pay | Admitting: Cardiology

## 2012-12-30 NOTE — Telephone Encounter (Signed)
New problem   Medication question

## 2012-12-30 NOTE — Telephone Encounter (Signed)
Verified with Dr Shirlee Latch.

## 2012-12-30 NOTE — Telephone Encounter (Signed)
Spoke with daughter, Marcelle Smiling. I confirmed Dr Shirlee Latch did want pt to take amiodarone 200mg  bid.

## 2013-01-02 ENCOUNTER — Telehealth: Payer: Self-pay | Admitting: *Deleted

## 2013-01-02 ENCOUNTER — Ambulatory Visit (HOSPITAL_COMMUNITY)
Admission: RE | Admit: 2013-01-02 | Discharge: 2013-01-02 | Disposition: A | Discharge status: Home / Self Care | Payer: Medicare Other | Source: Ambulatory Visit | Attending: Cardiology | Admitting: Cardiology

## 2013-01-02 ENCOUNTER — Encounter (HOSPITAL_COMMUNITY): Payer: Self-pay | Admitting: *Deleted

## 2013-01-02 ENCOUNTER — Ambulatory Visit (INDEPENDENT_AMBULATORY_CARE_PROVIDER_SITE_OTHER): Payer: Medicare Other | Admitting: *Deleted

## 2013-01-02 ENCOUNTER — Encounter: Payer: Self-pay | Admitting: *Deleted

## 2013-01-02 ENCOUNTER — Ambulatory Visit (HOSPITAL_COMMUNITY): Payer: Medicare Other | Admitting: Certified Registered Nurse Anesthetist

## 2013-01-02 ENCOUNTER — Encounter (HOSPITAL_COMMUNITY): Payer: Self-pay | Admitting: Certified Registered Nurse Anesthetist

## 2013-01-02 ENCOUNTER — Encounter (HOSPITAL_COMMUNITY)
Admission: RE | Disposition: A | Discharge status: Home / Self Care | Payer: Self-pay | Source: Ambulatory Visit | Attending: Cardiology

## 2013-01-02 VITALS — BP 100/68 | HR 94 | Ht 66.0 in

## 2013-01-02 DIAGNOSIS — I739 Peripheral vascular disease, unspecified: Secondary | ICD-10-CM | POA: Insufficient documentation

## 2013-01-02 DIAGNOSIS — E785 Hyperlipidemia, unspecified: Secondary | ICD-10-CM | POA: Insufficient documentation

## 2013-01-02 DIAGNOSIS — Z7901 Long term (current) use of anticoagulants: Secondary | ICD-10-CM | POA: Insufficient documentation

## 2013-01-02 DIAGNOSIS — I4892 Unspecified atrial flutter: Secondary | ICD-10-CM

## 2013-01-02 DIAGNOSIS — I4891 Unspecified atrial fibrillation: Secondary | ICD-10-CM | POA: Insufficient documentation

## 2013-01-02 DIAGNOSIS — Z87891 Personal history of nicotine dependence: Secondary | ICD-10-CM | POA: Insufficient documentation

## 2013-01-02 DIAGNOSIS — F411 Generalized anxiety disorder: Secondary | ICD-10-CM | POA: Insufficient documentation

## 2013-01-02 DIAGNOSIS — I2589 Other forms of chronic ischemic heart disease: Secondary | ICD-10-CM | POA: Insufficient documentation

## 2013-01-02 DIAGNOSIS — Z8249 Family history of ischemic heart disease and other diseases of the circulatory system: Secondary | ICD-10-CM | POA: Insufficient documentation

## 2013-01-02 DIAGNOSIS — I509 Heart failure, unspecified: Secondary | ICD-10-CM | POA: Insufficient documentation

## 2013-01-02 DIAGNOSIS — Z79899 Other long term (current) drug therapy: Secondary | ICD-10-CM | POA: Insufficient documentation

## 2013-01-02 DIAGNOSIS — I129 Hypertensive chronic kidney disease with stage 1 through stage 4 chronic kidney disease, or unspecified chronic kidney disease: Secondary | ICD-10-CM | POA: Insufficient documentation

## 2013-01-02 DIAGNOSIS — I059 Rheumatic mitral valve disease, unspecified: Secondary | ICD-10-CM | POA: Insufficient documentation

## 2013-01-02 DIAGNOSIS — I5020 Unspecified systolic (congestive) heart failure: Secondary | ICD-10-CM | POA: Insufficient documentation

## 2013-01-02 DIAGNOSIS — I2510 Atherosclerotic heart disease of native coronary artery without angina pectoris: Secondary | ICD-10-CM | POA: Insufficient documentation

## 2013-01-02 DIAGNOSIS — E039 Hypothyroidism, unspecified: Secondary | ICD-10-CM | POA: Insufficient documentation

## 2013-01-02 DIAGNOSIS — N189 Chronic kidney disease, unspecified: Secondary | ICD-10-CM | POA: Insufficient documentation

## 2013-01-02 HISTORY — DX: Presence of urogenital implants: Z96.0

## 2013-01-02 HISTORY — DX: Presence of other specified devices: Z97.8

## 2013-01-02 HISTORY — PX: CARDIOVERSION: SHX1299

## 2013-01-02 SURGERY — CARDIOVERSION
Anesthesia: Monitor Anesthesia Care

## 2013-01-02 MED ORDER — AMIODARONE HCL 200 MG PO TABS: ORAL_TABLET | ORAL | Status: DC

## 2013-01-02 MED ORDER — SODIUM CHLORIDE 0.9 % IJ SOLN: 3.0000 mL | INTRAMUSCULAR | Status: DC | PRN

## 2013-01-02 MED ORDER — PROPOFOL 10 MG/ML IV BOLUS
INTRAVENOUS | Status: DC | PRN
  Administered 2013-01-02: 90 mg via INTRAVENOUS

## 2013-01-02 MED ORDER — HYDROCORTISONE 1 % EX CREA: 1.0000 "application " | TOPICAL_CREAM | Freq: Three times a day (TID) | CUTANEOUS | Status: DC | PRN

## 2013-01-02 MED ORDER — SODIUM CHLORIDE 0.9 % IV SOLN
INTRAVENOUS | Status: DC
  Administered 2013-01-02: 14:00:00 via INTRAVENOUS

## 2013-01-02 MED ORDER — SODIUM CHLORIDE 0.9 % IJ SOLN: 3.0000 mL | Freq: Two times a day (BID) | INTRAMUSCULAR | Status: DC

## 2013-01-02 MED ORDER — SODIUM CHLORIDE 0.9 % IV SOLN: 250.0000 mL | INTRAVENOUS | Status: DC

## 2013-01-02 NOTE — Anesthesia Preprocedure Evaluation (Addendum)
Anesthesia Evaluation  Patient identified by MRN, date of birth, ID band Patient awake  General Assessment Comment:See surgeon's H&P  Reviewed: Allergy & Precautions, H&P , NPO status , Patient's Chart, lab work & pertinent test results, reviewed documented beta blocker date and time   Airway Mallampati: II TM Distance: >3 FB Neck ROM: full    Dental   Pulmonary pneumonia -, resolved,  breath sounds clear to auscultation        Cardiovascular hypertension, Pt. on medications and Pt. on home beta blockers + CAD, + Peripheral Vascular Disease and +CHF + dysrhythmias Atrial Fibrillation + Valvular Problems/Murmurs MR Rhythm:irregular     Neuro/Psych negative neurological ROS  negative psych ROS   GI/Hepatic negative GI ROS, Neg liver ROS,   Endo/Other  Hypothyroidism   Renal/GU Renal disease  negative genitourinary   Musculoskeletal   Abdominal   Peds  Hematology negative hematology ROS (+)   Anesthesia Other Findings See surgeon's H&P   Reproductive/Obstetrics negative OB ROS                           Anesthesia Physical Anesthesia Plan  ASA: III  Anesthesia Plan: General   Post-op Pain Management:    Induction: Intravenous  Airway Management Planned: Mask  Additional Equipment:   Intra-op Plan:   Post-operative Plan:   Informed Consent: I have reviewed the patients History and Physical, chart, labs and discussed the procedure including the risks, benefits and alternatives for the proposed anesthesia with the patient or authorized representative who has indicated his/her understanding and acceptance.   Dental Advisory Given  Plan Discussed with: CRNA and Surgeon  Anesthesia Plan Comments:         Anesthesia Quick Evaluation

## 2013-01-02 NOTE — Addendum Note (Signed)
Addendum created 01/02/13 1426 by Hart Robinsons, MD   Modules edited: Anesthesia Attestations

## 2013-01-02 NOTE — Transfer of Care (Signed)
Immediate Anesthesia Transfer of Care Note  Patient: [name]  Procedure(s) Performed: Procedure(s): CARDIOVERSION (N/A)  Patient Location: Endoscopy Unit  Anesthesia Type:General  Level of Consciousness: awake, alert  and oriented  Airway & Oxygen Therapy: Patient Spontanous Breathing  Post-op Assessment: Report given to PACU RN, Post -op Vital signs reviewed and stable and Patient moving all extremities  Post vital signs: Reviewed and stable  Complications: No apparent anesthesia complications

## 2013-01-02 NOTE — Preoperative (Signed)
Beta Blockers   Reason not to administer Beta Blockers:Not Applicable, took metoprolol this am 

## 2013-01-02 NOTE — Procedures (Signed)
Electrical Cardioversion Procedure Note [name] [id number] 03-08-1926  Procedure: Electrical Cardioversion Indications:  Atrial Flutter.  He has been therapeutic on coumadin for > 1 month.   Procedure Details Consent: Risks of procedure as well as the alternatives and risks of each were explained to the (patient/caregiver).  Consent for procedure obtained. Time Out: Verified patient identification, verified procedure, site/side was marked, verified correct patient position, special equipment/implants available, medications/allergies/relevent history reviewed, required imaging and test results available.  Performed  Patient placed on cardiac monitor, pulse oximetry, supplemental oxygen as necessary.  Sedation given: Propofol per anesthesiology Pacer pads placed anterior and posterior chest.  Cardioverted 1 time(s).  Cardioverted at 150J.  Evaluation Findings: Post procedure EKG shows: NSR Complications: None Patient did tolerate procedure well.  Continue amiodarone at 200 mg bid x 7 days then 200 mg daily after that.  See me in 2 wks.   Marca Ancona 01/02/2013, 2:06 PM

## 2013-01-02 NOTE — Interval H&P Note (Signed)
History and Physical Interval Note:  01/02/2013 2:06 PM  [name]  has presented today for surgery, with the diagnosis of a-fib  The various methods of treatment have been discussed with the patient and family. After consideration of risks, benefits and other options for treatment, the patient has consented to  Procedure(s): CARDIOVERSION (N/A) as a surgical intervention .  The patient's history has been reviewed, patient examined, no change in status, stable for surgery.  I have reviewed the patient's chart and labs.  Questions were answered to the patient's satisfaction.     Dalton [employer]

## 2013-01-02 NOTE — Telephone Encounter (Signed)
Message copied by Antony Odea on Fri Jan 02, 2013  3:10 PM ------      Message from: Laurey Morale      Created: Fri Jan 02, 2013  2:17 PM       Please have [name] followup in 1-2 weeks in the office.  He should continue amiodarone 200 mg bid x 7 days then 200 mg daily after that.  ------

## 2013-01-02 NOTE — Discharge Instructions (Signed)
Sedation or General Anesthesia, Adult  Care After  Refer to this sheet in the next 24 hours. These instructions provide you with information on caring for yourself after your procedure. Your caregiver may also give you more specific instructions. Your treatment has been planned according to current medical practices, but problems sometimes occur. Call your caregiver if you have any problems or questions after your procedure.   HOME CARE INSTRUCTIONS   Do not participate in any activities that require you to be alert or coordinated. Do not:  Drive.  Swim.  Ride a bicycle.  Operate heavy machinery.  Cook.  Use power tools.  Climb ladders.  Work at heights.  Take a bath.  Do not drink alcohol.  Do not make any important decisions or sign legal documents.  Stay with an adult.  The first meal following your procedure should be light and small. Avoid solid foods if you feel sick to your stomach (nauseous) or if you throw up (vomit).  Drink enough fluids to keep your urine clear or pale yellow.  Only take your usual medicines or new medicines if your caregiver approves them.  Only take over-the-counter or prescription medicines for pain, discomfort, or fever as directed by your caregiver.  Keep all follow-up appointments as directed by your caregiver.  SEEK IMMEDIATE MEDICAL CARE IF:   You are not feeling normal or behaving normally after 24 hours.  You have persistent nausea and vomiting.  You are unable to drink fluids or eat food.  You have difficulty urinating.  You have difficulty breathing or speaking.  You have blue or gray skin.  There is difficulty waking or you cannot be woken up.  You have heavy bleeding, redness, or a lot of swelling where the sedative or anesthesia entered your skin (intravenous site).  You have a rash.  MAKE SURE YOU:  Understand these instructions.  Will watch your condition.  Will get help right away if you are not doing well or get worse.  Document Released: 09/10/2005 Document Revised:  03/11/2012 Document Reviewed: 01/09/2012  ExitCare Patient Information 2013 [employer].

## 2013-01-02 NOTE — H&P (View-Only) (Signed)
Patient ID: [name], male   DOB: 09/02/1926, 79 y.o.   MRN: [id number] PCP: Dr. Lauenstein  79 yo with history of CAD, ischemic CMP, CKD, and paroxysmal atrial fibrillation presents for cardiology followup. He speaks little English and his daughter interprets.  No chest pain.  Patient has a history of myalgias with Crestor and Lipitor.  He has tolerated pravastatin without myalgias.  He walks 2 miles most days without exertional dyspnea.  He denies lightheadedness.   Recently, he has noted that his heart rate has been higher than baseline.  Initially, it was in the 80s-90s.  Today, it was in the 100s-140s.  He was here today for a coumadin check so was added onto my schedule.  He has not noted any new dyspnea or chest pain, but he has noted palpitations, especially today.  They make him feel "bad."  ECG today showed atypical atrial flutter with rate 105.  At last appointment, he was in NSR.    ECG: Atypical atrial flutter at 105 bpm (2:1 block).   Labs (9/11): K 4.1, creatinine 1.4  Labs (8/12): K 3.8, creatinine 1.3, TSH normal, LFTs normal, LDL 96, HDL 50 Labs (1/13): K 3.9, creatinine 1.3, TSH normal, LFTs normal Labs (2/13): K 3.8, creatinine 1.3 Labs (5/13): K 3.5, creatinine 1.4, BNP 331, LFTs normal, TSH normal Labs (9/13): LDL 85, HDL 53, BNP 547, LFTs normal Labs (12/13): K 4.3, creatinine 1.49 => 1.4, digoxin 1.4, TSH normal, LFTs normal Labs (1/14): digoxin 1.0, K 4.3, creatinine 1.5 Labs (3/14): free T4 normal Labs (4/14): K 5, creatinine 1.8, digoxin 0.6  Allergies (verified):  1) ! Crestor  2) Vytorin  3) Atenolol   Past Medical History:  1. Coronary artery disease: LHC 4/06 with 60% pLAD, 40% ramus, 60% mRCA, and 90% mPDA. No intervention. Inferior akinesis on LV-gram was suggestive of possible inferior MI with recanalization.  2. Hyperlipidemia: Myalgias with Lipitor and Crestor.  3. Hypothyroidism  4. Peripheral vascular disease  5. Anxiety  6. Skin cancer, hx of  2008 BCC  7. Colonic polyps, hx of  8. Paroxysmal atrial fibrillation with rapid ventricular response treated with amiodarone therapy converting to normal sinus rhythm 02/2009.  He has also had atypical atrial flutter noted in 4/14.  9. Systolic CHF: Probable ischemic cardiomyopathy. Echo (6/10) with EF 40%, mildly dilated LV, moderate MR.  Echo (3/12): EF 30-35%, inferolateral severe hypokinesis, trivial AI, moderate to severe MR.  10. History of PNA  11. CKD. Baseline creatinine around 1.4.  12. Mitral regurgitation: Moderate by echo 6/10. 2-3+ by cath 2006.  Moderate-severe by echo in 3/12.  13. PVCs  14. BPH  Family History:  Family History of CAD Male 1st degree relative <60   Social History:  Retired  Originally from [place], speaks little English  Married  Former Smoker   Review of Systems:  All systems reviewed and negative except as per HPI.   Current Outpatient Prescriptions  Medication Sig Dispense Refill  . amiodarone (PACERONE) 200 MG tablet TAKE 1/2 TABLET BY MOUTH DAILY  30 tablet  5  . co-enzyme Q-10 30 MG capsule Take 100 mg by mouth daily.       . digoxin (LANOXIN) 0.125 MG tablet 1 every other day      . furosemide (LASIX) 40 MG tablet 1 tablet (total 40mg) in the AM      . levothyroxine (SYNTHROID, LEVOTHROID) 100 MCG tablet Take 1 tablet (100 mcg total) by mouth daily.  30 tablet    11  . lisinopril (PRINIVIL,ZESTRIL) 5 MG tablet Take 1 tablet (5 mg total) by mouth daily.  90 tablet  3  . metoprolol succinate (TOPROL-XL) 25 MG 24 hr tablet 1/2 tablet (total 12.5mg) two times a day  30 tablet  11  . Multiple Vitamin (MULTIVITAMIN) tablet Take 1 tablet by mouth daily.      . Omega-3 Fatty Acids (FISH OIL) 1200 MG CAPS Take 1 capsule by mouth daily.       . pravastatin (PRAVACHOL) 80 MG tablet TAKE 1 TABLET (80 MG TOTAL) BY MOUTH DAILY.  90 tablet  1  . Tamsulosin HCl (FLOMAX) 0.4 MG CAPS Take 1 capsule (0.4 mg total) by mouth daily.  90 capsule  3  . warfarin  (COUMADIN) 1 MG tablet Take as directed by [facility]  60 tablet  3  . zolpidem (AMBIEN) 10 MG tablet Take 1 tablet (10 mg total) by mouth at bedtime as needed.  30 tablet  5   No current facility-administered medications for this visit.    BP 114/64  Resp 20  Ht 5' 6" (1.676 m)  Wt 140 lb (63.504 kg)  BMI 22.61 kg/m2 General: NAD Neck: No JVD; no thyromegaly or thyroid nodule.  Lungs: Slight crackles at the bases bilaterally.  CV: Nondisplaced PMI.  Heart mildly tachy, regular S1/S2, no S3/S4, 1/6 HSM at apex.  No edema.  No carotid bruit. Abdomen: Soft, nontender, no hepatosplenomegaly, no distention.  Neurologic: Alert and oriented x 3.  Psych: Normal affect. Extremities: No clubbing or cyanosis.   Assessment/Plan: 1. ATRIAL FLUTTER  Atypical atrial flutter with 2:1 block and HR 105 noted today.  He has noted a higher heart rate for several days.  Given his CHF, I think that he would benefit from restoration of NSR.  He is already on amiodarone given prior history of paroxysmal atrial fibrillation.  - INRs reviewed, they have been therapeutic > 2 months.  Will arrange for DCCV on Friday if he remains in atrial flutter.  He will come by the office for point of care INR and ECG to make sure he is still in atrial flutter before going to hospital for DCCV.  - Increase amiodarone to 200 mg bid until cardioversion.  Will likely maintain him on a higher chronic dose of amiodarone, probably 200 mg daily.  - Continue warfarin.   2. HYPERLIPIDEMIA  He is tolerating pravastatin without myalgias. Had myalgias with Lipitor and Crestor.  Continue current statin dosing.  3. MITRAL INSUFFICIENCY Last echo showed moderate to severe MR. EF is 30-35%. Probably NYHA class II symptoms. Given age and medical condition, would plan medical management.  4. SYSTOLIC HEART FAILURE, CHRONIC  EF 30-35% on last echo. Breathing is stable, NYHA class II symptoms.  Uptitration of meds has been difficult due to  orthostatic-type symptoms.  - Continue digoxin every other day.  Digoxin level has been ok.  - Continue current dose of Toprol XL and lisinopril.  - Continue current dose of Lasix.  - K and creatinine were higher when labs were last done, will repeat BMET today.  - Would avoid ICD at his age.   Shubham Thackston 12/29/2012   

## 2013-01-02 NOTE — Telephone Encounter (Signed)
App made for 2 weeks/ family verbalized understanding of medication regiment.

## 2013-01-02 NOTE — Anesthesia Postprocedure Evaluation (Signed)
  Anesthesia Post-op Note  Patient: [name]  Procedure(s) Performed: Procedure(s): CARDIOVERSION (N/A)  Patient Location: Endoscopy Unit  Anesthesia Type:General  Level of Consciousness: awake, alert  and oriented  Airway and Oxygen Therapy: Patient Spontanous Breathing  Post-op Pain: none  Post-op Assessment: Post-op Vital signs reviewed, Patient's Cardiovascular Status Stable, Respiratory Function Stable, Patent Airway and No signs of Nausea or vomiting  Post-op Vital Signs: Reviewed and stable  Complications: No apparent anesthesia complications

## 2013-01-02 NOTE — Progress Notes (Signed)
Patient in today for an EKG pre cardioversion. Dr Tenny Craw reviewed EKG, Atrial flutter. Copy given to patient. Patient will proceed with cardioversion.

## 2013-01-05 ENCOUNTER — Encounter (HOSPITAL_COMMUNITY): Payer: Self-pay | Admitting: Cardiology

## 2013-01-07 ENCOUNTER — Other Ambulatory Visit (INDEPENDENT_AMBULATORY_CARE_PROVIDER_SITE_OTHER): Payer: Medicare Other

## 2013-01-07 ENCOUNTER — Ambulatory Visit (INDEPENDENT_AMBULATORY_CARE_PROVIDER_SITE_OTHER): Payer: Medicare Other | Admitting: *Deleted

## 2013-01-07 ENCOUNTER — Other Ambulatory Visit: Payer: Self-pay

## 2013-01-07 DIAGNOSIS — I4891 Unspecified atrial fibrillation: Secondary | ICD-10-CM

## 2013-01-07 DIAGNOSIS — I10 Essential (primary) hypertension: Secondary | ICD-10-CM

## 2013-01-07 DIAGNOSIS — I2510 Atherosclerotic heart disease of native coronary artery without angina pectoris: Secondary | ICD-10-CM

## 2013-01-07 DIAGNOSIS — I5022 Chronic systolic (congestive) heart failure: Secondary | ICD-10-CM

## 2013-01-07 LAB — BASIC METABOLIC PANEL
Calcium: 8.5 mg/dL (ref 8.4–10.5)
Creatinine, Ser: 1.4 mg/dL (ref 0.4–1.5)
GFR: 52.49 mL/min — ABNORMAL LOW (ref >60.00)
Glucose, Bld: 101 mg/dL — ABNORMAL HIGH (ref 70–99)
Sodium: 137 mEq/L (ref 135–145)

## 2013-01-07 LAB — POCT INR: INR: 2.2

## 2013-01-07 MED ORDER — POTASSIUM CHLORIDE ER 10 MEQ PO TBCR: 10.0000 meq | EXTENDED_RELEASE_TABLET | Freq: Every day | ORAL | Status: DC

## 2013-01-09 ENCOUNTER — Encounter: Payer: Self-pay | Admitting: Cardiology

## 2013-01-09 ENCOUNTER — Telehealth: Payer: Self-pay | Admitting: Cardiology

## 2013-01-09 ENCOUNTER — Ambulatory Visit (INDEPENDENT_AMBULATORY_CARE_PROVIDER_SITE_OTHER): Payer: Medicare Other | Admitting: Emergency Medicine

## 2013-01-09 ENCOUNTER — Ambulatory Visit: Payer: Medicare Other

## 2013-01-09 VITALS — BP 114/62 | HR 76 | Temp 98.7°F | Resp 18 | Ht 64.0 in | Wt 141.2 lb

## 2013-01-09 DIAGNOSIS — R918 Other nonspecific abnormal finding of lung field: Secondary | ICD-10-CM

## 2013-01-09 DIAGNOSIS — R509 Fever, unspecified: Secondary | ICD-10-CM

## 2013-01-09 DIAGNOSIS — R8281 Pyuria: Secondary | ICD-10-CM

## 2013-01-09 DIAGNOSIS — I4891 Unspecified atrial fibrillation: Secondary | ICD-10-CM

## 2013-01-09 DIAGNOSIS — R9389 Abnormal findings on diagnostic imaging of other specified body structures: Secondary | ICD-10-CM

## 2013-01-09 DIAGNOSIS — R82998 Other abnormal findings in urine: Secondary | ICD-10-CM

## 2013-01-09 LAB — POCT URINALYSIS DIPSTICK
Bilirubin, UA: NEGATIVE
Ketones, UA: NEGATIVE
Protein, UA: NEGATIVE
Spec Grav, UA: 1.01

## 2013-01-09 LAB — POCT CBC
Granulocyte percent: 82.7 %G — AB (ref 37–80)
HCT, POC: 41.1 % — AB (ref 43.5–53.7)
Hemoglobin: 12.9 g/dL — AB (ref 14.1–18.1)
Lymph, poc: 1 (ref 0.6–3.4)
MCV: 93.3 fL (ref 80–97)
POC Granulocyte: 7.1 — AB (ref 2–6.9)
POC LYMPH PERCENT: 11.3 %L (ref 10–50)
RDW, POC: 14.7 %

## 2013-01-09 LAB — POCT UA - MICROSCOPIC ONLY
Casts, Ur, LPF, POC: NEGATIVE
Crystals, Ur, HPF, POC: NEGATIVE
Yeast, UA: NEGATIVE

## 2013-01-09 MED ORDER — CEPHALEXIN 500 MG PO TABS: 500.0000 mg | ORAL_TABLET | Freq: Two times a day (BID) | ORAL | Status: DC

## 2013-01-09 NOTE — Telephone Encounter (Signed)
Follow up    Daughter calling back is aware that Dr. Shirlee Latch is not in the office today will take her father see the PCP.

## 2013-01-09 NOTE — Telephone Encounter (Signed)
New problem    Would like to be seen today.   Daughter calling from her home   C/O uncomfortable in chest . Running a fever x 2 nights .  PCP was not contacted

## 2013-01-09 NOTE — Progress Notes (Signed)
Subjective:    Patient ID: [name], male    DOB: [birth year], 79 y.o.   MRN: [id number]  HPI patient recently underwent cardioversion. This was for chronic atrial fib. He has a long-standing indwelling urinary catheter. The last 2 days he has had intermittent fever or chills and sweats. He has not had a cough he has hadn't had any GI symptoms. He has no other specific complaints at the present time. Except that he doesn't feel well and feels weak.    Review of Systems     Objective:   Physical Exam physical exam reveals an elderly gentleman who is in no distress. His neck is supple. His chest was clear anteriorly there are a few rhonchi noted bilaterally. Cardiac exam has an irregular rhythm. The abdomen is soft without tenderness. There is an indwelling urinary catheter. Skin is without rash.  EKG  Sinus rhythm with PACs  [facility] reading (PRIMARY) by  Dr. Cleta Alberts there are no definite areas of pneumonia. There is a small amount of fluid at the right costophrenic angle  Results for orders placed in visit on 01/09/13  POCT CBC      Result Value Range   WBC 8.6  4.6 - 10.2 K/uL   Lymph, poc 1.0  0.6 - 3.4   POC LYMPH PERCENT 11.3  10 - 50 %L   MID (cbc) 0.5  0 - 0.9   POC MID % 6.0  0 - 12 %M   POC Granulocyte 7.1 (*) 2 - 6.9   Granulocyte percent 82.7 (*) 37 - 80 %G   RBC 4.40 (*) 4.69 - 6.13 M/uL   Hemoglobin 12.9 (*) 14.1 - 18.1 g/dL   HCT, POC 08.6 (*) 57.8 - 53.7 %   MCV 93.3  80 - 97 fL   MCH, POC 29.3  27 - 31.2 pg   MCHC 31.4 (*) 31.8 - 35.4 g/dL   RDW, POC 46.9     Platelet Count, POC 177  142 - 424 K/uL   MPV 8.3  0 - 99.8 fL      Results for orders placed in visit on 01/09/13  POCT CBC      Result Value Range   WBC 8.6  4.6 - 10.2 K/uL   Lymph, poc 1.0  0.6 - 3.4   POC LYMPH PERCENT 11.3  10 - 50 %L   MID (cbc) 0.5  0 - 0.9   POC MID % 6.0  0 - 12 %M   POC Granulocyte 7.1 (*) 2 - 6.9   Granulocyte percent 82.7 (*) 37 - 80 %G   RBC 4.40 (*) 4.69 - 6.13 M/uL    Hemoglobin 12.9 (*) 14.1 - 18.1 g/dL   HCT, POC 62.9 (*) 52.8 - 53.7 %   MCV 93.3  80 - 97 fL   MCH, POC 29.3  27 - 31.2 pg   MCHC 31.4 (*) 31.8 - 35.4 g/dL   RDW, POC 41.3     Platelet Count, POC 177  142 - 424 K/uL   MPV 8.3  0 - 99.8 fL  POCT URINALYSIS DIPSTICK      Result Value Range   Color, UA yellow     Clarity, UA clear     Glucose, UA neg     Bilirubin, UA neg     Ketones, UA neg     Spec Grav, UA 1.010     Blood, UA mod     pH, UA 5.0  Protein, UA neg     Urobilinogen, UA 0.2     Nitrite, UA pos     Leukocytes, UA small (1+)    POCT UA - MICROSCOPIC ONLY      Result Value Range   WBC, Ur, HPF, POC 3-8     RBC, urine, microscopic 0-1     Bacteria, U Microscopic 3 plus     Mucus, UA trace     Epithelial cells, urine per micros 0-2     Crystals, Ur, HPF, POC neg     Casts, Ur, LPF, POC neg     Yeast, UA neg      Assessment & Plan:

## 2013-01-09 NOTE — Telephone Encounter (Signed)
Spoke with pt dtr, pt has had a fever off and on for the last two days. He has no other symptoms. She wanted for her father to see dr Shirlee Latch today. Explained that dr Shirlee Latch was working in the hosp today. The dtr has been in touch with the pts PCP and is taking him to be seen today.

## 2013-01-09 NOTE — Telephone Encounter (Signed)
[name] is currently scheduled to see Dr.McLean on 4/24 at 845 AM.

## 2013-01-09 NOTE — Patient Instructions (Addendum)
?????????? ???????????  (Fever) ?????????? ??????????? ???? - ???????????, ??????????? ?????. ?????????? ??????????? ???????????:  ? ?????????.  ??-?? ??????? ????????? (?? ???, ?????????, ????????? ??? ? ???).  ?? ??????? ???. ? ???????? ?????????? ??????????? ????, ?????????? ? ??????? ???????, ?????? ????????? 98,6 ?? ?????????? (F) ??? 37 ?? ??????? (C). ????????? ??????????? ???????? ?? 1,8 F ??? 1 C ?????? ????????? ??????????? ?? ?????. ? ????????????? ?????? 28 ???? ?????????? ??????????? ?? 100,4 F (38 C) ?????? ????????? ??????????. ????????? ??????????? ???? ?? ???????? ????????????, ?? ????? ???? ????????? ???????????. ???????  ????????? ??????????? ?????? ??????? ?????????.  ?????????? ?????????, ?? ????????? ? ???????????? ????????????, ??????? ????? ????? ????? ???????? ?????????? ???????????. ????????:  ????????? ???? ???????.  ??????????? ?????????? ?????? ??? ?????????????.  ???????????? ???????? ???????.  ????????? ???? ????.  ??????? ?? ????? ????????.  ? ????????? ??????? ??????? ????????? ??????????? ?????????? ??????????. ????? ????? ????????? ?????????? ?????????? ??????????? ??-?? ??????????????? ??????? ??? ????????? ???????????? ?????????????.  ????????? ????????? ????? ???????? ? ?????????? ?????????? ??????????? ????, ????????:  ????.  ????????????? ????????.  ? ????????? ???????? ??????????? ???? ????? ??????????, ?? ??? ???? ????? ????????? ??????????? ?? ????????? ????????. ????????:  ??????????? ?????????? ??????????.  ?????????????.  ??????? ??????????? ?????????? ?????. ????????  ???????? ????? ??? ????.  ????????? ??? ?????????.  ???? ?? ????? ????.  ?????.  ?????.  ??????????. ??????? ????????? ??????????? ????????? ??? ?????????????, ?.?. ??????? ?????????? ??????. ????? ???????? ??????????? ???????????. ??????????? ????? ???????? ??????????? ?????????. ????????? ??????? ?????, ?????? ????? ??????. ????????: ?  ????????, ?????????? ? ? ?????:   ????????? ??????????? ? ??????? ???????.  ????????? ??????????? ? ????? ???????? ????? ??????, ???? ??? ???????? ? ???????????? ? ???????????? ????????????? ??????????.  ????????? ??????????? ????????? ?? ???????? ??????????? ? ?? ?????????????.  ??????????? ??????????? ??????????? ???????? ?????? ? ?????. ? ????????????? ? ????????? ?????:  ????????????? ???????? ??????????? ? ???????? ?????????, ?.?. ?????? ?????? ???????? ??????.  ????????? ??????????? ? ????? ???????? ? ???? ???????? ???? ???????? ?????????? ? ?? ?????????????.  ????????? ??????????? ???? ?? ???? ?????? ???????????. ????? ? ??????????  ??? ?????????? ??????????? ???? ?????????? ?????? ?????????, ??????? ??????? ? ??????? ???????????? ????? ???? ????????? ?????????? ??????? ??? ????????? ??????. ????? ????????? ???????????? ??????, ???????? ? ?????????, ?????????? ????????????? ?????? ??? ??????.  ??????? ????????????? ??? ??????? ??????????? ????? ???????? ? ?????????????.  ??????? ??????????? ????? ??????? ???????? ? ????????? ? ????? ???????? ????????.  ? ??????? ????? ??????? ??????????? ????? ?????????????? ??????????? ????????. ???????  ??? ???????? ??????????? ????? ????????? ?????????.  ?? ??????? ????? ???????. ???????? ????? ????? ??????? ?????? ???????? ? ????????? ???????? ???. ??????? ??? - ??????, ?? ???????????? ?????????? ??????? ???????????.  ???? ?????????? ??????????? ??????? ???????????? ????????????, ??? ??????? ???????? ???????? ????? ????????, ????????? ???????? ???? ??????? ? ???????????? ? ????????.  ????????? ??? ??????? ? ?????? ?????? ???? ????? ???????? ???? ? ??????? ??????????? ????. ?? ??????????? ??????? ????? ??? ?????????? ?????????, ????????? ??? ????? ???????? ? ?????? ?, ??????????, ???????? ??????????? ????.  ?? ?????????? ??????? ? ?????? ??? ?????? ??????.  ??? ?????????????? ????????????? ????? ???? ???????? ? ???????????  ??????????. ???????????? ?? ????? ? ???????? ????????  ???????? ??????? ???????? ? ????????? ???????? ? ??????????? ??????????. ?????????? ???, ??? ??? ???????, ?? ?? ????? ???????? ???????? ? ?????? ??????.  ????? ??????????? ?????????? ???? ?/??? ????????, ????? ???? ???? ????????? ?????? ?????? ??? ??????????.  ????? ?? 3 ??????? ? ?????? ????? ?????? ?????????????? ????????? ? ??? ?????, ??????? ???? ????????????? ??????. ????????? ??????? ?? ???? ???????. ?????? ????????? ??????????????? ????. ?????????? ? ?????, ????:  ?? ??? ??? ??????? ?? ? ????????? ?????????? ????????.  ????????? ??????? ??? ??????.  ????????? ????.  ??????????? (???????? ?? ???) ?????????  102 F (38,9 C), ??? ????????? ?????? ?????? ???? ????.  ? ??? ??? ?????? ??????? ????????? ?????????? ????????, ??????????????, ?????????????? ????????? ??? ?????????? ?????.  ??????????? ????? ?????????? ????? ??? ???. ?????????? ????????? ?????? ??????, ????:  ???????? ???????? ?????? ??? ???????????? ???????.  ?? ???????? ????????.  ???????? ??????????? ?? ?????????? ????.  ????????? ????? ??????? ???????? (????????, ???? ? ??????, ???, ?????, ????? ??? ? ??????? ???????).  ???????? ????????? ????????.  ????? ? ?????? ???????????? ?????? ??????-???? ????.  ? ??? ????????? ???????? ? ??? ?/??? ????? ?????????? ????????????? ? ?????.  ????????? ???????????? ?????????? ??????????? ???? 102 F (38,9 C). Document Released: 06/19/2008 Document Revised: 12/03/2011 [facility] Patient Information 2013 [place], [place].

## 2013-01-11 LAB — URINE CULTURE: Colony Count: >=100000

## 2013-01-12 ENCOUNTER — Encounter: Payer: Self-pay | Admitting: Nurse Practitioner

## 2013-01-12 ENCOUNTER — Ambulatory Visit (INDEPENDENT_AMBULATORY_CARE_PROVIDER_SITE_OTHER): Payer: Medicare Other | Admitting: *Deleted

## 2013-01-12 ENCOUNTER — Ambulatory Visit (INDEPENDENT_AMBULATORY_CARE_PROVIDER_SITE_OTHER): Payer: Medicare Other | Admitting: Nurse Practitioner

## 2013-01-12 VITALS — BP 120/68 | HR 65 | Ht 66.0 in | Wt 140.0 lb

## 2013-01-12 DIAGNOSIS — I4891 Unspecified atrial fibrillation: Secondary | ICD-10-CM

## 2013-01-12 DIAGNOSIS — I480 Paroxysmal atrial fibrillation: Secondary | ICD-10-CM

## 2013-01-12 DIAGNOSIS — Z79899 Other long term (current) drug therapy: Secondary | ICD-10-CM

## 2013-01-12 LAB — BASIC METABOLIC PANEL
BUN: 20 mg/dL (ref 6–23)
CO2: 27 mEq/L (ref 19–32)
Calcium: 8.5 mg/dL (ref 8.4–10.5)
Chloride: 103 mEq/L (ref 96–112)
Creatinine, Ser: 1.4 mg/dL (ref 0.4–1.5)
GFR: 50.36 mL/min — ABNORMAL LOW (ref >60.00)
Glucose, Bld: 102 mg/dL — ABNORMAL HIGH (ref 70–99)
Potassium: 3.8 mEq/L (ref 3.5–5.1)
Sodium: 136 mEq/L (ref 135–145)

## 2013-01-12 NOTE — Patient Instructions (Addendum)
Continue with your current medicines and finish the antibiotic  We will recheck your potassium today and your coumadin level  Keep your May 5th visit with Dr. Shirlee Latch. You do not need to come back here this week but call if you start having problems  Call the [facility] office at [phone] if you have any questions, problems or concerns.

## 2013-01-12 NOTE — Progress Notes (Signed)
[name] Date of Birth: [birth year] Medical Record #[id number]  History of Present Illness: [name] is seen back today as a work in visit. He is seen for Dr. Shirlee Latch. He has a history of CAD, ischemic CM with an EF of 30 to 35%, moderate to severe MR, CKD and PAF. He has myalgias with Crestor and Lipitor and tolerates Pravastatin. He has had difficulty with uptitration of his medicines due to orthostasis.   Most recently noted to be in atrial flutter. He had already been on amiodarone. His dose was increased. He was referred for cardioversion. This was done on April 11th and sinus rhythm was restored.   Called here on Friday with a fever for 2 days and some chest discomfort. Was to be in touch with his PCP. Apparently has a chronic indwelling foley catheter. Was started on antibiotics. EKG on Friday showed sinus.   He comes in today. He is here with his daughter. She provides the history. He speaks very little [place]. He is from the [place]. She notes a fever last week that was associated with chills. Was started on antibiotics Friday. Urine looked abnormal but his culture grew multiple organisms. He is now doing better. Not short of breath. Not swelling. Not coughing. No palpitations. She was worried that this would be his heart.   Current Outpatient Prescriptions on File Prior to Visit  Medication Sig Dispense Refill  . amiodarone (PACERONE) 200 MG tablet 200 mg BID for 7 days 01/02/13 - 01/08/13  then 200 mg daily there after.  37 tablet  5  . Cephalexin 500 MG tablet Take 1 tablet (500 mg total) by mouth 2 (two) times daily.  20 tablet  0  . co-enzyme Q-10 30 MG capsule Take 100 mg by mouth daily.       . digoxin (LANOXIN) 0.125 MG tablet 1 every other day      . furosemide (LASIX) 40 MG tablet 1 tablet (total 40mg ) in the AM      . levothyroxine (SYNTHROID, LEVOTHROID) 100 MCG tablet Take 1 tablet (100 mcg total) by mouth daily.  30 tablet  11  . lisinopril (PRINIVIL,ZESTRIL)  5 MG tablet Take 1 tablet (5 mg total) by mouth daily.  90 tablet  3  . metoprolol succinate (TOPROL-XL) 25 MG 24 hr tablet 1/2 tablet (total 12.5mg ) two times a day  30 tablet  11  . Multiple Vitamin (MULTIVITAMIN) tablet Take 1 tablet by mouth daily.      . Omega-3 Fatty Acids (FISH OIL) 1200 MG CAPS Take 1 capsule by mouth daily.       . pravastatin (PRAVACHOL) 80 MG tablet TAKE 1 TABLET (80 MG TOTAL) BY MOUTH DAILY.  90 tablet  1  . Tamsulosin HCl (FLOMAX) 0.4 MG CAPS Take 1 capsule (0.4 mg total) by mouth daily.  90 capsule  3  . warfarin (COUMADIN) 1 MG tablet Take as directed by [facility]  60 tablet  3  . zolpidem (AMBIEN) 10 MG tablet Take 1 tablet (10 mg total) by mouth at bedtime as needed.  30 tablet  5   No current facility-administered medications on file prior to visit.    Allergies  Allergen Reactions  . Atenolol     REACTION: brady  . Ezetimibe-Simvastatin     REACTION: spasms  . Rosuvastatin    Past Medical History:  1. Coronary artery disease: LHC 4/06 with 60% pLAD, 40% ramus, 60% mRCA, and 90% mPDA. No intervention. Inferior akinesis on  LV-gram was suggestive of possible inferior MI with recanalization.  2. Hyperlipidemia: Myalgias with Lipitor and Crestor.  3. Hypothyroidism  4. Peripheral vascular disease  5. Anxiety  6. Skin cancer, hx of 2008 BCC  7. Colonic polyps, hx of  8. Paroxysmal atrial fibrillation with rapid ventricular response treated with amiodarone therapy converting to normal sinus rhythm 02/2009. He has also had atypical atrial flutter noted in 4/14. Underwent cardioversion on April 11th, 2014 with restoration of sinus rhythm.  9. Systolic CHF: Probable ischemic cardiomyopathy. Echo (6/10) with EF 40%, mildly dilated LV, moderate MR. Echo (3/12): EF 30-35%, inferolateral severe hypokinesis, trivial AI, moderate to severe MR.  10. History of PNA  11. CKD. Baseline creatinine around 1.4.  12. Mitral regurgitation: Moderate by echo 6/10. 2-3+  by cath 2006. Moderate-severe by echo in 3/12.  13. PVCs  14. BPH - has a chronic indwelling foley catheter    Past Surgical History  Procedure Laterality Date  . Skin cancer excision  2008    shoulder and nose   . Cardioversion N/A 01/02/2013    Procedure: CARDIOVERSION;  Surgeon: Laurey Morale, MD;  Location: [facility] ENDOSCOPY;  Service: Cardiovascular;  Laterality: N/A;    History  Smoking status  . Never Smoker   Smokeless tobacco  . Not on file    History  Alcohol Use No    Family History  Problem Relation Age of Onset  . Coronary artery disease Other     male, 1st degree relative <60   . Heart disease Mother     Review of Systems: The review of systems is per the HPI.  All other systems were reviewed and are negative.  Physical Exam: Ht 5\' 6"  (1.676 m)  Wt 140 lb (63.504 kg)  BMI 22.61 kg/m2 Patient is  pleasant and in no acute distress. Skin is warm and dry. Color is normal.  HEENT is unremarkable. Normocephalic/atraumatic. PERRL. Sclera are nonicteric. Neck is supple. No masses. No JVD. Lungs are clear. Cardiac exam shows a regular rate and rhythm. Frequent ectopics. Abdomen is soft. Extremities are without edema. Gait and ROM are intact. No gross neurologic deficits noted.  LABORATORY DATA: BMET and INR pending for today  Lab Results  Component Value Date   WBC 8.6 01/09/2013   HGB 12.9* 01/09/2013   HCT 41.1* 01/09/2013   PLT 173.0 12/29/2012   GLUCOSE 101* 01/07/2013   CHOL 155 06/23/2012   TRIG 84.0 06/23/2012   HDL 52.90 06/23/2012   LDLDIRECT 133.2 01/12/2011   LDLCALC 85 06/23/2012   ALT 21 12/20/2012   AST 29 12/20/2012   NA 137 01/07/2013   K 3.2* 01/07/2013   CL 103 01/07/2013   CREATININE 1.4 01/07/2013   BUN 18 01/07/2013   CO2 25 01/07/2013   TSH 3.14 09/19/2012   PSA 1.68 02/04/2008   INR 2.2 01/07/2013   HGBA1C 5.6 02/04/2008   Assessment / Plan: 1. Atrial flutter/fib - on amiodarone - recent cardioversion - he remains in sinus with a 1st degree AV  block. Rate is ok. I have left [name] on his current regimen. He will be back in 2 weeks to see Dr. Shirlee Latch.   2. Systolic heart failure with EF 30 to 35% - looks compensated to me. Does need a repeat BMET today to look at his potassium.   3. CAD - no chest pain.   4. Fever - on antibiotics - has chronic indwelling foley catheter. I suspect this is the culprit. He is  clinically improved.   5. HLD - on statin  6. Chronic coumadin - on higher dose of amiodarone, on antibiotic - needs recheck today.   I do not think he needs to come back Thursday. Will keep his May visit with Dr. Shirlee Latch.   Patient and daughter are agreeable to this plan and will call if any problems develop in the interim.   Nathan Macadamia, RN, ANP-C [facility] [address] [place], [place]  [address]

## 2013-01-14 ENCOUNTER — Telehealth: Payer: Self-pay | Admitting: Cardiology

## 2013-01-14 NOTE — Telephone Encounter (Signed)
New problem    Pt need to know how does he need to take his Amiodarone. Please call pt.

## 2013-01-15 ENCOUNTER — Ambulatory Visit: Payer: Medicare Other | Admitting: Cardiology

## 2013-01-15 LAB — CULTURE, BLOOD (SINGLE): Organism ID, Bacteria: NO GROWTH

## 2013-01-15 NOTE — Telephone Encounter (Signed)
Pt's daughter, Marcelle Smiling aware amiodarone should be 200mg  daily.

## 2013-01-15 NOTE — Telephone Encounter (Signed)
Reviewed with Dr Shirlee Latch. Pt should stay on amiodarone 200mg  daily. LMTCB for Natasha.

## 2013-01-19 ENCOUNTER — Other Ambulatory Visit: Payer: Self-pay | Admitting: Cardiology

## 2013-01-19 ENCOUNTER — Ambulatory Visit (INDEPENDENT_AMBULATORY_CARE_PROVIDER_SITE_OTHER): Payer: Medicare Other | Admitting: *Deleted

## 2013-01-19 DIAGNOSIS — I4891 Unspecified atrial fibrillation: Secondary | ICD-10-CM

## 2013-01-26 ENCOUNTER — Ambulatory Visit (INDEPENDENT_AMBULATORY_CARE_PROVIDER_SITE_OTHER): Payer: Medicare Other | Admitting: Cardiology

## 2013-01-26 ENCOUNTER — Ambulatory Visit (INDEPENDENT_AMBULATORY_CARE_PROVIDER_SITE_OTHER): Payer: Medicare Other

## 2013-01-26 ENCOUNTER — Encounter: Payer: Self-pay | Admitting: Cardiology

## 2013-01-26 ENCOUNTER — Ambulatory Visit: Payer: Medicare Other | Admitting: Cardiology

## 2013-01-26 VITALS — BP 122/58 | HR 60 | Ht 66.0 in | Wt 137.2 lb

## 2013-01-26 DIAGNOSIS — I4891 Unspecified atrial fibrillation: Secondary | ICD-10-CM

## 2013-01-26 DIAGNOSIS — I5022 Chronic systolic (congestive) heart failure: Secondary | ICD-10-CM

## 2013-01-26 DIAGNOSIS — E785 Hyperlipidemia, unspecified: Secondary | ICD-10-CM

## 2013-01-26 DIAGNOSIS — I2510 Atherosclerotic heart disease of native coronary artery without angina pectoris: Secondary | ICD-10-CM

## 2013-01-26 LAB — POCT INR: INR: 2.9

## 2013-01-26 LAB — HEPATIC FUNCTION PANEL
AST: 41 U/L — ABNORMAL HIGH (ref 0–37)
Albumin: 3.8 g/dL (ref 3.5–5.2)
Alkaline Phosphatase: 49 U/L (ref 39–117)
Total Protein: 7.6 g/dL (ref 6.0–8.3)

## 2013-01-26 LAB — TSH: TSH: 1.5 u[IU]/mL (ref 0.35–5.50)

## 2013-01-26 NOTE — Patient Instructions (Addendum)
Your physician recommends that you have lab work today--TSH/Digoxin level/ Liver profile.   Your physician recommends that you schedule a follow-up appointment in: 3 months with Dr Shirlee Latch.

## 2013-01-26 NOTE — Progress Notes (Signed)
Patient ID: [name], male   DOB: 08-07-1926, 79 y.o.   MRN: [id number] PCP: Dr. Milus Glazier  79 yo with history of CAD, ischemic CMP, CKD, and paroxysmal atrial fibrillation/flutter presents for cardiology followup. He speaks little [place] and his daughter interprets.  No chest pain.  Patient has a history of myalgias with Crestor and Lipitor.  He has tolerated pravastatin without myalgias.  He walks 2.5 miles most days without exertional dyspnea.  No chest pain.  He denies lightheadedness.  Last month, I cardioverted him out of atypical atrial flutter.  He is in NSR today on amiodarone.   Labs (9/11): K 4.1, creatinine 1.4  Labs (8/12): K 3.8, creatinine 1.3, TSH normal, LFTs normal, LDL 96, HDL 50 Labs (1/13): K 3.9, creatinine 1.3, TSH normal, LFTs normal Labs (2/13): K 3.8, creatinine 1.3 Labs (5/13): K 3.5, creatinine 1.4, BNP 331, LFTs normal, TSH normal Labs (9/13): LDL 85, HDL 53, BNP 547, LFTs normal Labs (12/13): K 4.3, creatinine 1.49 => 1.4, digoxin 1.4, TSH normal, LFTs normal Labs (1/14): digoxin 1.0, K 4.3, creatinine 1.5 Labs (3/14): free T4 normal Labs (4/14): K 5=> 3.8, creatinine 1.8 => 1.4, digoxin 0.6  Allergies (verified):  1) ! Crestor  2) Vytorin  3) Atenolol   Past Medical History:  1. Coronary artery disease: LHC 4/06 with 60% pLAD, 40% ramus, 60% mRCA, and 90% mPDA. No intervention. Inferior akinesis on LV-gram was suggestive of possible inferior MI with recanalization.  2. Hyperlipidemia: Myalgias with Lipitor and Crestor.  3. Hypothyroidism  4. Peripheral vascular disease  5. Anxiety  6. Skin cancer, hx of 2008 BCC  7. Colonic polyps, hx of  8. Paroxysmal atrial fibrillation with rapid ventricular response treated with amiodarone therapy converting to normal sinus rhythm 02/2009.  He has also had atypical atrial flutter noted in 4/14, cardioverted to NSR in 4/14.  9. Systolic CHF: Probable ischemic cardiomyopathy. Echo (6/10) with EF 40%, mildly dilated  LV, moderate MR.  Echo (3/12): EF 30-35%, inferolateral severe hypokinesis, trivial AI, moderate to severe MR.  10. History of PNA  11. CKD. Baseline creatinine around 1.4.  12. Mitral regurgitation: Moderate by echo 6/10. 2-3+ by cath 2006.  Moderate-severe by echo in 3/12.  13. PVCs  14. BPH  Family History:  Family History of CAD Male 1st degree relative <60   Social History:  Retired  Originally from [place], speaks little English  Married  Former Smoker   Current Outpatient Prescriptions  Medication Sig Dispense Refill  . amiodarone (PACERONE) 200 MG tablet 200 mg BID for 7 days 01/02/13 - 01/08/13  then 200 mg daily there after.  37 tablet  5  . co-enzyme Q-10 30 MG capsule Take 100 mg by mouth daily.       . digoxin (LANOXIN) 0.125 MG tablet 1 every other day      . furosemide (LASIX) 40 MG tablet 1 tablet (total 40mg ) in the AM      . levothyroxine (SYNTHROID, LEVOTHROID) 100 MCG tablet Take 1 tablet (100 mcg total) by mouth daily.  30 tablet  11  . lisinopril (PRINIVIL,ZESTRIL) 5 MG tablet Take 1 tablet (5 mg total) by mouth daily.  90 tablet  3  . metoprolol succinate (TOPROL-XL) 25 MG 24 hr tablet 1/2 tablet (total 12.5mg ) two times a day  30 tablet  11  . Multiple Vitamin (MULTIVITAMIN) tablet Take 1 tablet by mouth daily.      . Omega-3 Fatty Acids (FISH OIL) 1200 MG CAPS Take  1 capsule by mouth daily.       . potassium chloride (K-DUR) 10 MEQ tablet Take 20 mEq by mouth daily.      . pravastatin (PRAVACHOL) 80 MG tablet TAKE 1 TABLET (80 MG TOTAL) BY MOUTH DAILY.  90 tablet  1  . Tamsulosin HCl (FLOMAX) 0.4 MG CAPS Take 1 capsule (0.4 mg total) by mouth daily.  90 capsule  3  . warfarin (COUMADIN) 1 MG tablet Take as directed by [facility] Clinic  60 tablet  3  . zolpidem (AMBIEN) 10 MG tablet Take 1 tablet (10 mg total) by mouth at bedtime as needed.  30 tablet  5   No current facility-administered medications for this visit.    BP 122/58  Pulse 60  Ht 5\' 6"  (1.676  m)  Wt 137 lb 4 oz (62.256 kg)  BMI 22.16 kg/m2  SpO2 98% General: NAD Neck: No JVD; no thyromegaly or thyroid nodule.  Lungs: Slight crackles at the bases bilaterally.  CV: Nondisplaced PMI.  Heart mildly tachy, regular S1/S2, no S3/S4, 1/6 HSM at apex.  No edema.  No carotid bruit. Abdomen: Soft, nontender, no hepatosplenomegaly, no distention.  Neurologic: Alert and oriented x 3.  Psych: Normal affect. Extremities: No clubbing or cyanosis.   Assessment/Plan: 1. ATRIAL FLUTTER  Patient remains in NSR after cardioversion. He will continue amiodarone at 200 mg daily and warfarin.   Check LFTs, TSH today.  He will need yearly eye exam.  2. HYPERLIPIDEMIA  He is tolerating pravastatin without myalgias. Had myalgias with Lipitor and Crestor.  Continue current statin dosing.  3. MITRAL INSUFFICIENCY Last echo showed moderate to severe MR. EF is 30-35%. Probably NYHA class II symptoms. Given age and medical condition, would plan medical management.  4. SYSTOLIC HEART FAILURE, CHRONIC  EF 30-35% on last echo. Breathing is stable, NYHA class II symptoms.  Uptitration of meds has been difficult due to orthostatic-type symptoms.  - Continue digoxin every other day.  Check level today. - Continue current dose of Toprol XL and lisinopril.  - Continue current dose of Lasix.  - Would avoid ICD at his age.   Marca Ancona 01/26/2013

## 2013-01-27 ENCOUNTER — Telehealth: Payer: Self-pay | Admitting: Cardiology

## 2013-01-27 DIAGNOSIS — R748 Abnormal levels of other serum enzymes: Secondary | ICD-10-CM

## 2013-01-27 MED ORDER — PACERONE 200 MG PO TABS: 200.0000 mg | ORAL_TABLET | Freq: Every day | ORAL | Status: DC

## 2013-01-27 NOTE — Telephone Encounter (Signed)
Spoke with patient's daughter, Marcelle Smiling.

## 2013-01-27 NOTE — Telephone Encounter (Signed)
New Prob      Has questions regarding pts medication (AMIODARONE). Would like to speak to nurse.

## 2013-01-27 NOTE — Telephone Encounter (Signed)
He is requesting prescription for brand name pacerone. Pt had tachycardia when he had been given generic. His PCP advised pt to take brand name only.

## 2013-02-08 ENCOUNTER — Other Ambulatory Visit: Payer: Self-pay | Admitting: Family Medicine

## 2013-02-11 ENCOUNTER — Ambulatory Visit (INDEPENDENT_AMBULATORY_CARE_PROVIDER_SITE_OTHER): Payer: Medicare Other | Admitting: *Deleted

## 2013-02-11 DIAGNOSIS — I4891 Unspecified atrial fibrillation: Secondary | ICD-10-CM

## 2013-02-24 ENCOUNTER — Other Ambulatory Visit (INDEPENDENT_AMBULATORY_CARE_PROVIDER_SITE_OTHER): Payer: Medicare Other

## 2013-02-24 ENCOUNTER — Ambulatory Visit (INDEPENDENT_AMBULATORY_CARE_PROVIDER_SITE_OTHER): Payer: Medicare Other | Admitting: *Deleted

## 2013-02-24 DIAGNOSIS — R748 Abnormal levels of other serum enzymes: Secondary | ICD-10-CM

## 2013-02-24 DIAGNOSIS — I4891 Unspecified atrial fibrillation: Secondary | ICD-10-CM

## 2013-02-24 DIAGNOSIS — I10 Essential (primary) hypertension: Secondary | ICD-10-CM

## 2013-02-24 LAB — BASIC METABOLIC PANEL
Calcium: 8.8 mg/dL (ref 8.4–10.5)
Creatinine, Ser: 1.5 mg/dL (ref 0.4–1.5)
GFR: 46.54 mL/min — ABNORMAL LOW (ref >60.00)
Sodium: 139 mEq/L (ref 135–145)

## 2013-02-24 LAB — HEPATIC FUNCTION PANEL
ALT: 26 U/L (ref 0–53)
AST: 32 U/L (ref 0–37)
Albumin: 3.7 g/dL (ref 3.5–5.2)
Alkaline Phosphatase: 49 U/L (ref 39–117)
Total Protein: 7.3 g/dL (ref 6.0–8.3)

## 2013-02-24 LAB — POCT INR: INR: 2.2

## 2013-02-25 ENCOUNTER — Telehealth: Payer: Self-pay | Admitting: Cardiology

## 2013-02-25 NOTE — Telephone Encounter (Signed)
dtr rtn call re lab results

## 2013-02-25 NOTE — Telephone Encounter (Signed)
Spoke with pt's daughter about recent lab results.

## 2013-03-03 ENCOUNTER — Other Ambulatory Visit: Payer: Self-pay | Admitting: Dermatology

## 2013-03-19 ENCOUNTER — Ambulatory Visit (INDEPENDENT_AMBULATORY_CARE_PROVIDER_SITE_OTHER): Payer: Medicare Other | Admitting: *Deleted

## 2013-03-19 DIAGNOSIS — I4891 Unspecified atrial fibrillation: Secondary | ICD-10-CM

## 2013-03-19 LAB — POCT INR: INR: 2.7

## 2013-04-01 ENCOUNTER — Ambulatory Visit (INDEPENDENT_AMBULATORY_CARE_PROVIDER_SITE_OTHER): Payer: Medicare Other | Admitting: Family Medicine

## 2013-04-01 ENCOUNTER — Encounter: Payer: Self-pay | Admitting: Cardiology

## 2013-04-01 VITALS — BP 118/50 | HR 66 | Temp 98.4°F | Resp 16 | Ht 64.5 in | Wt 130.0 lb

## 2013-04-01 DIAGNOSIS — J209 Acute bronchitis, unspecified: Secondary | ICD-10-CM

## 2013-04-01 MED ORDER — PREDNISONE 20 MG PO TABS: 20.0000 mg | ORAL_TABLET | Freq: Every day | ORAL | Status: DC

## 2013-04-01 MED ORDER — AZITHROMYCIN 500 MG PO TABS: 500.0000 mg | ORAL_TABLET | Freq: Every day | ORAL | Status: DC

## 2013-04-01 NOTE — Progress Notes (Signed)
Is an 79 year old man from she have, [place] who complains of 40s of cough. The cough has largely been nonproductive and he said no fever. He takes his daily walk and is not short of breath. He's had no extremity edema. His heart rate is been normal and his blood pressure at home is been fine.  Family tends to get there he anxious about the patient because he's been Center and focused attention and is getting older and more fragile.  Objective: Patient is sitting on exam table in good spirits, in no distress,  HEENT: Unremarkable Chest: Few basilar fine rales on inspiration. Heart: Regular, no murmur Extremities: No edema Skin: Warm and dry and normal skin color  Assessment: Mild bronchitis in the gentleman has had repeated bouts of respiratory illness including pneumonia  Plan:Acute bronchitis - Plan: predniSONE (DELTASONE) 20 MG tablet, azithromycin (ZITHROMAX) 500 MG tablet, DISCONTINUED: azithromycin (ZITHROMAX) 500 MG tablet, DISCONTINUED: predniSONE (DELTASONE) 20 MG tablet  Signed, Elvina Sidle, MD

## 2013-04-02 ENCOUNTER — Telehealth: Payer: Self-pay

## 2013-04-02 ENCOUNTER — Telehealth: Payer: Self-pay | Admitting: Cardiology

## 2013-04-02 ENCOUNTER — Other Ambulatory Visit: Payer: Self-pay | Admitting: Family Medicine

## 2013-04-02 DIAGNOSIS — J209 Acute bronchitis, unspecified: Secondary | ICD-10-CM

## 2013-04-02 MED ORDER — AMOXICILLIN 875 MG PO TABS: 875.0000 mg | ORAL_TABLET | Freq: Two times a day (BID) | ORAL | Status: DC

## 2013-04-02 NOTE — Telephone Encounter (Signed)
New Prob     States pt is currently on a medication that could possibly increase INR. Would like to speak to nurse regarding this.

## 2013-04-02 NOTE — Telephone Encounter (Signed)
Spoke with patient's daughter who states that patient has had a non-productive cough x 4 days without fever; patient's daughter denies SOB, chest pain, extremity swelling or weight gain.  The patient was evaluated by Family medicine yesterday and was diagnosed with acute bronchitis and was prescribed Prednisone and Zithromax.  Patient's daughter states when they went to get the prescriptions filled that they were told that the Zithromax was contraindicated in patients taking Amiodarone.  She states that she wanted to make Dr. Shirlee Latch aware of this because she feels that patient may have another issue going on; that cough may be r/t to Amiodarone therapy rather than coming from bronchitis.  Daughter states that no chest xray was done and that she feels one should be ordered.  I informed the patient's daughter that Dr. Shirlee Latch is not in the office today and that I will discuss patient with Dr. Graciela Husbands, DOD. Dr. Graciela Husbands advised that with symptoms occurring for 4 days and not acutely and since the patient does not have fever/chills/SOB that he feels the risk of Amiodarone lung toxicity is low. Dr. Graciela Husbands advised that he feels antibiotic and prednisone therapy is the best course of action for patient; that family practice MD should prescribe a different antibiotic, however the Zithromax is not entirely contraindicated.  I informed patient's daughter of Dr. Odessa Fleming advice and that he commended patient's pharmacist for recognizing the potential risk of Zithromax  and that patient should make an appointment with Dr. Shirlee Latch if symptoms persist. Patient's daughter verbalized understanding and asked that I send a message to Dr. Shirlee Latch and Katina Dung, RN to ask that patient be seen as soon as possible.

## 2013-04-02 NOTE — Telephone Encounter (Signed)
New Problem  Pt daughter states that pt has a bad cough. She said she tried to get him in with his PCP but he is not in office.  She is wanting to speak with a nurse or she if he could be seen today.

## 2013-04-02 NOTE — Telephone Encounter (Signed)
Returned call to pt's daughter pt started taking 875mg  Amoxicillin BID and also taking Prednisone 20mg  QD x 3 days on 04/02/13.  Made appt to check pt's INR on 04/06/13.

## 2013-04-02 NOTE — Telephone Encounter (Signed)
Called pharmacy. zithromax interacts with pacerone please advise.

## 2013-04-02 NOTE — Telephone Encounter (Signed)
[facility] [address] needs to speak regarding an rx prescribed by dr Milus Glazier. Per pharmacy there is a drug interaction

## 2013-04-04 ENCOUNTER — Ambulatory Visit: Payer: Medicare Other

## 2013-04-04 ENCOUNTER — Ambulatory Visit (INDEPENDENT_AMBULATORY_CARE_PROVIDER_SITE_OTHER): Payer: Medicare Other | Admitting: Family Medicine

## 2013-04-04 VITALS — BP 110/57 | HR 64 | Temp 97.8°F | Resp 18 | Ht 64.5 in | Wt 139.0 lb

## 2013-04-04 DIAGNOSIS — R05 Cough: Secondary | ICD-10-CM

## 2013-04-04 DIAGNOSIS — R51 Headache: Secondary | ICD-10-CM

## 2013-04-04 DIAGNOSIS — R059 Cough, unspecified: Secondary | ICD-10-CM

## 2013-04-04 NOTE — Progress Notes (Signed)
Is an 79 year old man from [place] who presents again with dry persistent cough. The cough is not keeping him awake at night although he is somewhat anxious and the anxiety does addendum sleeping well. He has a long history of atrial fibrillation and mild CHF.  He was seen several days ago and we gave him some amoxicillin to see if that would relieve the cough. He seemed to get better at first but then again yesterday to get the cough back again. He takes a daily walk and is not short of breath nor has he had significant edema of the legs. He denies any leg pain or chest pain. His appetite is good.  Objective: No acute distress, somewhat anxious man seen with his wife and daughter in the exam room. There is a line which barrier and I rely on the daughter for translation. HEENT: Unremarkable Neck: No JVD no thyromegaly no adenopathy. Neck is supple Chest: Bibasilar fine rales about 3-4 cm up from the base. Color is good. Heart: Irregular with rate of about 60. He has a fine 2/6 systolic murmur best heard at the right sternal border Extremities: No edema, no calf pain, good color Skin: Small healing abrasion left lateral malleolus [facility] reading (PRIMARY) by  Dr. Milus Glazier:  CXR. Possible increase in interstitial lines  Assessment: Mild increase in fluid  Plan: Recheck lungs in 5 days, increase Lasix to 60 mg daily for 2 days to see if this will affect a decrease in the cough  Signed, Elvina Sidle

## 2013-04-06 ENCOUNTER — Other Ambulatory Visit: Payer: Self-pay | Admitting: Cardiology

## 2013-04-06 ENCOUNTER — Ambulatory Visit (INDEPENDENT_AMBULATORY_CARE_PROVIDER_SITE_OTHER): Payer: Medicare Other

## 2013-04-06 DIAGNOSIS — I4891 Unspecified atrial fibrillation: Secondary | ICD-10-CM

## 2013-04-06 MED ORDER — WARFARIN SODIUM 1 MG PO TABS: ORAL_TABLET | ORAL | Status: DC

## 2013-04-07 ENCOUNTER — Telehealth: Payer: Self-pay | Admitting: Radiology

## 2013-04-07 NOTE — Telephone Encounter (Signed)
Spoke to patients daughter, he is vomiting. Has probable GI virus. Called daughter advised clear liquids today and lots of rest he is to try broth and increase diet as tolerated tomorrow. FYI to you. Daughter will let me know if she needs anything else

## 2013-04-29 ENCOUNTER — Other Ambulatory Visit: Payer: Self-pay

## 2013-04-29 ENCOUNTER — Other Ambulatory Visit: Payer: Self-pay | Admitting: Cardiology

## 2013-05-05 ENCOUNTER — Ambulatory Visit (INDEPENDENT_AMBULATORY_CARE_PROVIDER_SITE_OTHER): Payer: Medicare Other | Admitting: *Deleted

## 2013-05-05 ENCOUNTER — Ambulatory Visit (INDEPENDENT_AMBULATORY_CARE_PROVIDER_SITE_OTHER): Payer: Medicare Other | Admitting: Family Medicine

## 2013-05-05 VITALS — BP 100/52 | HR 65 | Temp 100.0°F | Resp 17 | Ht 64.5 in | Wt 138.0 lb

## 2013-05-05 DIAGNOSIS — N390 Urinary tract infection, site not specified: Secondary | ICD-10-CM

## 2013-05-05 DIAGNOSIS — I4891 Unspecified atrial fibrillation: Secondary | ICD-10-CM

## 2013-05-05 DIAGNOSIS — R509 Fever, unspecified: Secondary | ICD-10-CM

## 2013-05-05 DIAGNOSIS — R05 Cough: Secondary | ICD-10-CM

## 2013-05-05 DIAGNOSIS — R059 Cough, unspecified: Secondary | ICD-10-CM

## 2013-05-05 LAB — POCT CBC
Granulocyte percent: 84.2 %G — AB (ref 37–80)
HCT, POC: 39.8 % — AB (ref 43.5–53.7)
Hemoglobin: 12.7 g/dL — AB (ref 14.1–18.1)
Lymph, poc: 0.9 (ref 0.6–3.4)
MCH, POC: 30.3 pg (ref 27–31.2)
MCHC: 31.9 g/dL (ref 31.8–35.4)
MCV: 95.1 fL (ref 80–97)
MID (cbc): 0.5 (ref 0–0.9)
MPV: 7.8 fL (ref 0–99.8)
POC Granulocyte: 7.8 — AB (ref 2–6.9)
POC LYMPH PERCENT: 9.9 %L — AB (ref 10–50)
POC MID %: 5.9 %M (ref 0–12)
Platelet Count, POC: 177 10*3/uL (ref 142–424)
RBC: 4.19 M/uL — AB (ref 4.69–6.13)
RDW, POC: 16 %
WBC: 9.3 10*3/uL (ref 4.6–10.2)

## 2013-05-05 LAB — POCT URINALYSIS DIPSTICK
Bilirubin, UA: NEGATIVE
Glucose, UA: NEGATIVE
Ketones, UA: NEGATIVE
Nitrite, UA: POSITIVE
Protein, UA: NEGATIVE
Spec Grav, UA: 1.015
Urobilinogen, UA: 0.2
pH, UA: 5.5

## 2013-05-05 LAB — POCT UA - MICROSCOPIC ONLY
Casts, Ur, LPF, POC: NEGATIVE
Crystals, Ur, HPF, POC: NEGATIVE
Mucus, UA: NEGATIVE
Yeast, UA: NEGATIVE

## 2013-05-05 MED ORDER — SULFAMETHOXAZOLE-TMP DS 800-160 MG PO TABS: 1.0000 | ORAL_TABLET | Freq: Two times a day (BID) | ORAL | Status: DC

## 2013-05-05 MED ORDER — CIPROFLOXACIN HCL 500 MG PO TABS: 500.0000 mg | ORAL_TABLET | Freq: Two times a day (BID) | ORAL | Status: DC

## 2013-05-05 NOTE — Progress Notes (Signed)
Fever for 2 days with aching, left ear soreness, and slight cough.  Associated with weakness, dizziness, unstable gait  Objective:  NAD TM's normal Neck: supple, no ac nodes Chest:  Right lower lung rales Heart:  Reg, I/VI syst ejection murmur Extrem:  No edema Results for orders placed in visit on 05/05/13  POCT URINALYSIS DIPSTICK      Result Value Range   Color, UA yellow     Clarity, UA hazy     Glucose, UA neg     Bilirubin, UA neg     Ketones, UA neg     Spec Grav, UA 1.015     Blood, UA small     pH, UA 5.5     Protein, UA neg     Urobilinogen, UA 0.2     Nitrite, UA positive     Leukocytes, UA moderate (2+)    POCT UA - MICROSCOPIC ONLY      Result Value Range   WBC, Ur, HPF, POC tntc     RBC, urine, microscopic 0-1     Bacteria, U Microscopic 3+     Mucus, UA neg     Epithelial cells, urine per micros 0-4     Crystals, Ur, HPF, POC neg     Casts, Ur, LPF, POC neg     Yeast, UA neg    POCT CBC      Result Value Range   WBC 9.3  4.6 - 10.2 K/uL   Lymph, poc 0.9  0.6 - 3.4   POC LYMPH PERCENT 9.9 (*) 10 - 50 %L   MID (cbc) 0.5  0 - 0.9   POC MID % 5.9  0 - 12 %M   POC Granulocyte 7.8 (*) 2 - 6.9   Granulocyte percent 84.2 (*) 37 - 80 %G   RBC 4.19 (*) 4.69 - 6.13 M/uL   Hemoglobin 12.7 (*) 14.1 - 18.1 g/dL   HCT, POC 78.4 (*) 69.6 - 53.7 %   MCV 95.1  80 - 97 fL   MCH, POC 30.3  27 - 31.2 pg   MCHC 31.9  31.8 - 35.4 g/dL   RDW, POC 29.5     Platelet Count, POC 177  142 - 424 K/uL   MPV 7.8  0 - 99.8 fL  ' Assessment:  UTI

## 2013-05-05 NOTE — Addendum Note (Signed)
Addended by: Bonnee Quin on: 05/05/2013 06:24 PM   Modules accepted: Orders

## 2013-05-05 NOTE — Patient Instructions (Addendum)
???????? ????????????? ????? (  Urinary Tract Infection)  ???????? ????????????? ????? ????? ???????? ?? ????? ??????? ??????? ?????. ??????? ???? - ??? ??????? ?????? ??????? ? ?????? ???????? ?? ?????????. ? ??????? ???? ??????: 2 ?????, 2 ???????????, ??????? ?????? ? ?????????????????? ????? (??????). ????? -- ???? ???????, ??????? ?????? ?????????? ????. ?????? ????? ????? ?????? ? ????? ????????. ????? ??????????? ??? ??????? ?? ????? ????????. ???????  ???????? ?????????? ?????????????? ????????? ?????? -- ????????????????? ???????????, ? ??????? ????????? ??????, ?????? ? ????????. ??? ????????? ????????? ????, ??? ??????????? ?? ????? ?????? ??? ???????????. ???????? ???????? ???????? ???????????????? ???????????? ???????? ????????????? ?????.  ????????  ???????? ???????? ????????????? ????? ??????????? ? ??????????? ?? ???????? ? ???? ?????????, ? ????? ?? ????? ???????????? ????????. ???????? ? ??????? ?????? ?????? ?????????? ? ?????? ? ??????????? ??????? ? ?????????????? ? ? ??????????? ?????? ? ??????? ?????? ??? ?????? ?? ????? ??????????????. ? ?????? ????? ???????? ???????? ? ? ?????? ??????????? ???????? ????? ??????????? ? ???? ????????, ????????????, ????? ? ?????? ? ? ??????? ???????. ????????? ??????????? ???? ????? ????????, ??? ???????? ????????? ? ??????. ?????? ???????? ????????????? ?????????? ????? ???????? ? ???? ???? ? ???? ??? ??????????, ??????? ? ?????.  ???????  ??? ????, ????? ??????????????? ???????? ????????????? ????? ???? ?????????? ??? ? ?????????. ??? ??????? ???? ????? ???????? ?????? ????. ???? ??????????? ?? ??????????? ???????? ? ??????????. ????????? ???????????? ?????????? ??? ?????? ? ??????????.  ???????  ?????? ???????? ????????????? ????? ??????? ??????? ????????. ????????? ??????????? ???????? ????????????? ????? ?????????? ??????????, ?????? ??? ?? ??????? ??????????? ???? ????????????. ????? ???????????? ? ????????????????? ??????????????? ???????  ??????? ?? ????????? ? ???? ????????, ??????? ????? ???????? ????????.  ?????????? ?? ????? ? ???????? ????????   ???? ??? ????????? ???? ????????????, ?????????? ????????? ????????? ?????. ??????????? ????????? ????????? ?? ?????, ???? ???? ??? ????? ????? ????? ?????? ????? ???????????? ?????.  ????? ?????, ????? ???? ???? ??? ?????????? ??? ??????-??????.  ?? ????? ??????? ? ????????, ??? ? ???????????? ???????. ??? ???????? ? ??????????? ???????? ??????.  ?????????? ????? ?????????? ??????? ??????. ????????? ?????????? ?????????????? ?????????? ?????.  ??????? ?????? ?????????? ?????????? ?? ? ????? ?????.  ????? ????????? ???????? ??????? ??????????? ?? ??????????? ??????? ?????. ??????????? ?????? ????? ????????? ?????? ???? ???. ?????????? ? ?????, ????:   ????????? ???? ? ?????.  ? ??? ?????????? ???????????.  ???????? ?? ?????????? ????? 3 ???. ?????????? ?????????? ? ????? ??? ????????? ?????? ??????, ????:   ? ??? ????????? ?????? ???? ? ????? ??? ? ?????? ????? ??????? ???????.  ? ??? ??????? ?????.  ? ??? ??????? ??? ?????.  ?? ??????????? ?????????? ?????????? ??? ?????? ??? ??????????????. ?????????, ??? ??:   ????????? ????????? ??????????.  ?????? ?????????????? ??????? ?? ??????????.  ??????????????? ?????????? ? ?????, ???? ??? ?? ?????????? ????? ??? ?????????? ????. Document Released: 09/10/2005 Document Revised: 03/11/2012 [facility] Patient Information 2014 [place], [place].

## 2013-05-06 ENCOUNTER — Telehealth: Payer: Self-pay

## 2013-05-06 LAB — COMPREHENSIVE METABOLIC PANEL
ALT: 37 U/L (ref 0–53)
AST: 42 U/L — ABNORMAL HIGH (ref 0–37)
Albumin: 4.3 g/dL (ref 3.5–5.2)
Alkaline Phosphatase: 54 U/L (ref 39–117)
BUN: 18 mg/dL (ref 6–23)
CO2: 26 mEq/L (ref 19–32)
Calcium: 8.9 mg/dL (ref 8.4–10.5)
Chloride: 99 mEq/L (ref 96–112)
Creat: 1.66 mg/dL — ABNORMAL HIGH (ref 0.50–1.35)
Glucose, Bld: 109 mg/dL — ABNORMAL HIGH (ref 70–99)
Potassium: 4.2 mEq/L (ref 3.5–5.3)
Sodium: 135 mEq/L (ref 135–145)
Total Bilirubin: 0.8 mg/dL (ref 0.3–1.2)
Total Protein: 7.6 g/dL (ref 6.0–8.3)

## 2013-05-06 NOTE — Telephone Encounter (Signed)
Called in Rx for Baclofen 10 mg # 10 per Dr Milus Glazier. I called pts daughter to let her know.

## 2013-05-07 LAB — URINE CULTURE: Colony Count: >=100000

## 2013-05-19 ENCOUNTER — Other Ambulatory Visit: Payer: Self-pay | Admitting: Cardiology

## 2013-05-20 ENCOUNTER — Ambulatory Visit (INDEPENDENT_AMBULATORY_CARE_PROVIDER_SITE_OTHER): Payer: Medicare Other | Admitting: Cardiology

## 2013-05-20 ENCOUNTER — Ambulatory Visit (INDEPENDENT_AMBULATORY_CARE_PROVIDER_SITE_OTHER): Payer: Medicare Other | Admitting: *Deleted

## 2013-05-20 ENCOUNTER — Encounter: Payer: Self-pay | Admitting: Cardiology

## 2013-05-20 VITALS — BP 102/58 | HR 56 | Ht 64.0 in | Wt 136.8 lb

## 2013-05-20 DIAGNOSIS — I4891 Unspecified atrial fibrillation: Secondary | ICD-10-CM

## 2013-05-20 DIAGNOSIS — R0602 Shortness of breath: Secondary | ICD-10-CM

## 2013-05-20 DIAGNOSIS — I2510 Atherosclerotic heart disease of native coronary artery without angina pectoris: Secondary | ICD-10-CM

## 2013-05-20 DIAGNOSIS — I5022 Chronic systolic (congestive) heart failure: Secondary | ICD-10-CM

## 2013-05-20 DIAGNOSIS — F411 Generalized anxiety disorder: Secondary | ICD-10-CM

## 2013-05-20 DIAGNOSIS — I080 Rheumatic disorders of both mitral and aortic valves: Secondary | ICD-10-CM

## 2013-05-20 DIAGNOSIS — E785 Hyperlipidemia, unspecified: Secondary | ICD-10-CM

## 2013-05-20 LAB — POCT INR: INR: 2.1

## 2013-05-20 MED ORDER — WARFARIN SODIUM 1 MG PO TABS: ORAL_TABLET | ORAL | Status: DC

## 2013-05-20 MED ORDER — PACERONE 200 MG PO TABS: 200.0000 mg | ORAL_TABLET | Freq: Every day | ORAL | Status: DC

## 2013-05-20 MED ORDER — LISINOPRIL 5 MG PO TABS: 5.0000 mg | ORAL_TABLET | Freq: Every day | ORAL | Status: DC

## 2013-05-20 MED ORDER — ALPRAZOLAM 0.25 MG PO TABS: ORAL_TABLET | ORAL | Status: DC

## 2013-05-20 NOTE — Patient Instructions (Addendum)
Your physician recommends that you return for a FASTING lipid profile /liver profile/BMET/BNP/Digoxin level in 2 weeks.   Your physician recommends that you schedule a follow-up appointment in: 3 months with Dr Shirlee Latch.

## 2013-05-22 NOTE — Progress Notes (Signed)
Patient ID: [name], male   DOB: 31-Jul-1926, 79 y.o.   MRN: [id number] PCP: Dr. Milus Glazier  79 yo with history of CAD, ischemic CMP, CKD, and paroxysmal atrial fibrillation/flutter presents for cardiology followup. He speaks little [place] and his daughter interprets.  No chest pain.  Patient has a history of myalgias with Crestor and Lipitor.  He has tolerated pravastatin without myalgias.  He walks 2.5 miles most days without exertional dyspnea.  No chest pain.  He denies lightheadedness.  IN 4/14, I cardioverted him out of atypical atrial flutter.  He is in NSR today on amiodarone.  He has been sick recently with a UTI but this was treated and he is feeling better.  Main problem currently is ongoing severe anxiety.    Labs (9/11): K 4.1, creatinine 1.4  Labs (8/12): K 3.8, creatinine 1.3, TSH normal, LFTs normal, LDL 96, HDL 50 Labs (1/13): K 3.9, creatinine 1.3, TSH normal, LFTs normal Labs (2/13): K 3.8, creatinine 1.3 Labs (5/13): K 3.5, creatinine 1.4, BNP 331, LFTs normal, TSH normal Labs (9/13): LDL 85, HDL 53, BNP 547, LFTs normal Labs (12/13): K 4.3, creatinine 1.49 => 1.4, digoxin 1.4, TSH normal, LFTs normal Labs (1/14): digoxin 1.0, K 4.3, creatinine 1.5 Labs (3/14): free T4 normal Labs (4/14): K 5=> 3.8, creatinine 1.8 => 1.4, digoxin 0.6 Labs (5/14): TSH normal, digoxin level normal Labs (8/14): K 4.2, creatinine 1.66, AST 42, ALT 37  ECG: NSR, 1st degree AV block, poor anterior R wave progression.   Allergies (verified):  1) ! Crestor  2) Vytorin  3) Atenolol   Past Medical History:  1. Coronary artery disease: LHC 4/06 with 60% pLAD, 40% ramus, 60% mRCA, and 90% mPDA. No intervention. Inferior akinesis on LV-gram was suggestive of possible inferior MI with recanalization.  2. Hyperlipidemia: Myalgias with Lipitor and Crestor.  3. Hypothyroidism  4. Peripheral vascular disease  5. Anxiety  6. Skin cancer, hx of 2008 BCC  7. Colonic polyps, hx of  8.  Paroxysmal atrial fibrillation with rapid ventricular response treated with amiodarone therapy converting to normal sinus rhythm 02/2009.  He has also had atypical atrial flutter noted in 4/14, cardioverted to NSR in 4/14.  9. Systolic CHF: Probable ischemic cardiomyopathy. Echo (6/10) with EF 40%, mildly dilated LV, moderate MR.  Echo (3/12): EF 30-35%, inferolateral severe hypokinesis, trivial AI, moderate to severe MR.  10. History of PNA  11. CKD. Baseline creatinine around 1.4.  12. Mitral regurgitation: Moderate by echo 6/10. 2-3+ by cath 2006.  Moderate-severe by echo in 3/12.  13. PVCs  14. BPH 15. Recurrent UTIs  Family History:  Family History of CAD Male 1st degree relative <60   Social History:  Retired  Originally from [place], speaks little English  Married  Former Smoker   Current Outpatient Prescriptions  Medication Sig Dispense Refill  . co-enzyme Q-10 30 MG capsule Take 100 mg by mouth daily.       . digoxin (LANOXIN) 0.125 MG tablet 0.125 mg every other day. 1 every other day      . furosemide (LASIX) 40 MG tablet 1 tablet (total 40mg ) in the AM      . levothyroxine (SYNTHROID, LEVOTHROID) 100 MCG tablet Take 1 tablet (100 mcg total) by mouth daily.  30 tablet  11  . lisinopril (PRINIVIL,ZESTRIL) 5 MG tablet Take 1 tablet (5 mg total) by mouth daily.  90 tablet  3  . metoprolol succinate (TOPROL-XL) 25 MG 24 hr tablet 1/2 tablet (total  12.5mg ) two times a day  30 tablet  11  . Multiple Vitamin (MULTIVITAMIN) tablet Take 1 tablet by mouth daily.      . Omega-3 Fatty Acids (FISH OIL) 1200 MG CAPS Take 1 capsule by mouth daily.       Marland Kitchen PACERONE 200 MG tablet Take 1 tablet (200 mg total) by mouth daily.  90 tablet  3  . potassium chloride (K-DUR) 10 MEQ tablet Take 2 tablets (20 mEq total) by mouth daily.  60 tablet  3  . pravastatin (PRAVACHOL) 80 MG tablet TAKE 1 TABLET (80 MG TOTAL) BY MOUTH DAILY.  90 tablet  1  . sulfamethoxazole-trimethoprim (BACTRIM DS) 800-160 MG  per tablet Take 1 tablet by mouth 2 (two) times daily.  14 tablet  0  . Tamsulosin HCl (FLOMAX) 0.4 MG CAPS Take 1 capsule (0.4 mg total) by mouth daily.  90 capsule  3  . warfarin (COUMADIN) 1 MG tablet Take as directed by anticoagulation clinic  60 tablet  3  . zolpidem (AMBIEN) 10 MG tablet Take 1 tablet (10 mg total) by mouth at bedtime as needed.  30 tablet  5  . ALPRAZolam (XANAX) 0.25 MG tablet 1 as needed every 12 hours as needed. Do not take if you use Ambien  10 tablet  1   No current facility-administered medications for this visit.    BP 102/58  Pulse 56  Ht 5\' 4"  (1.626 m)  Wt 62.052 kg (136 lb 12.8 oz)  BMI 23.47 kg/m2  SpO2 97% General: NAD Neck: No JVD; no thyromegaly or thyroid nodule.  Lungs: Slight crackles at the bases bilaterally.  CV: Nondisplaced PMI.  Heart mildly tachy, regular S1/S2, no S3/S4, 1/6 HSM at apex.  No edema.  No carotid bruit. Abdomen: Soft, nontender, no hepatosplenomegaly, no distention.  Neurologic: Alert and oriented x 3.  Psych: Normal affect. Extremities: No clubbing or cyanosis.   Assessment/Plan: 1. ATRIAL FLUTTER  Patient remains in NSR after cardioversion. He will continue amiodarone at 200 mg daily and warfarin.   Check LFTs, TSH with labs in 2 wks.  He will need yearly eye exam.  2. HYPERLIPIDEMIA  He is tolerating pravastatin without myalgias. Had myalgias with Lipitor and Crestor.  Continue current statin dosing, check lipids in 2 wks.  3. MITRAL INSUFFICIENCY Last echo showed moderate to severe MR. EF is 30-35%. Probably NYHA class II symptoms. Given age and medical condition, would plan medical management.  4. SYSTOLIC HEART FAILURE, CHRONIC  EF 30-35% on last echo. Breathing is stable, NYHA class II symptoms.  Uptitration of meds has been difficult due to orthostatic-type symptoms.  - Continue digoxin every other day.  Level has been ok. - Continue current dose of Toprol XL and lisinopril.  - Continue current dose of Lasix.   - Would avoid ICD at his age.  5. Anxiety Patient has been having significant generalized anxiety for years.  His wife and daughter ask for something to "take the edge off."  I will give him a limited supply of Xanax 0.25 mg prn.  He should followup with PCP for consideration of SSRI.   Followup in 3 months  Marca Ancona 05/22/2013

## 2013-05-26 ENCOUNTER — Telehealth: Payer: Self-pay | Admitting: Cardiology

## 2013-05-26 NOTE — Telephone Encounter (Signed)
Pt's daughter states pt's heart rate for the last 4-5 days has been 45-47. Pt asymptomatic, just wanting to be sure this is OK since this is a little lower than usual for him. Will forward to Dr Shirlee Latch.

## 2013-05-26 NOTE — Telephone Encounter (Signed)
PT'S DTR CALLING RE PT'S HEART RATE BEING LOW

## 2013-05-27 NOTE — Telephone Encounter (Signed)
Probably no changes needed as long as does not get lower and he is not lightheaded.

## 2013-05-27 NOTE — Telephone Encounter (Signed)
Pt's daughter notified.

## 2013-06-02 ENCOUNTER — Other Ambulatory Visit (INDEPENDENT_AMBULATORY_CARE_PROVIDER_SITE_OTHER): Payer: Medicare Other

## 2013-06-02 DIAGNOSIS — I4891 Unspecified atrial fibrillation: Secondary | ICD-10-CM

## 2013-06-02 DIAGNOSIS — R0602 Shortness of breath: Secondary | ICD-10-CM

## 2013-06-02 DIAGNOSIS — I5022 Chronic systolic (congestive) heart failure: Secondary | ICD-10-CM

## 2013-06-02 LAB — BASIC METABOLIC PANEL
CO2: 27 mEq/L (ref 19–32)
Calcium: 8.7 mg/dL (ref 8.4–10.5)
GFR: 45.14 mL/min — ABNORMAL LOW (ref >60.00)
Sodium: 139 mEq/L (ref 135–145)

## 2013-06-02 LAB — LIPID PANEL
Cholesterol: 189 mg/dL (ref 0–200)
HDL: 51.1 mg/dL (ref >39.00)
LDL Cholesterol: 104 mg/dL — ABNORMAL HIGH (ref 0–99)
Total CHOL/HDL Ratio: 4
Triglycerides: 169 mg/dL — ABNORMAL HIGH (ref 0.0–149.0)
VLDL: 33.8 mg/dL (ref 0.0–40.0)

## 2013-06-02 LAB — HEPATIC FUNCTION PANEL
ALT: 38 U/L (ref 0–53)
Bilirubin, Direct: 0 mg/dL (ref 0.0–0.3)
Total Bilirubin: 0.6 mg/dL (ref 0.3–1.2)

## 2013-06-03 ENCOUNTER — Other Ambulatory Visit: Payer: Medicare Other

## 2013-06-03 LAB — DIGOXIN LEVEL: Digoxin Level: 1.3 ng/mL (ref 0.8–2.0)

## 2013-06-04 ENCOUNTER — Telehealth: Payer: Self-pay | Admitting: Cardiology

## 2013-06-04 NOTE — Telephone Encounter (Signed)
Notified wife of lab results.  She states he is taking digoxin every other day.  Verbalized understanding of results.

## 2013-06-04 NOTE — Telephone Encounter (Signed)
New message:  Please call pt with lab results from Tuesday.  Aware that Thurston Hole is off

## 2013-06-07 ENCOUNTER — Other Ambulatory Visit: Payer: Self-pay | Admitting: Family Medicine

## 2013-06-09 ENCOUNTER — Other Ambulatory Visit: Payer: Self-pay | Admitting: Family Medicine

## 2013-06-10 ENCOUNTER — Other Ambulatory Visit: Payer: Self-pay | Admitting: Family Medicine

## 2013-06-10 ENCOUNTER — Telehealth: Payer: Self-pay

## 2013-06-10 NOTE — Telephone Encounter (Signed)
Dr Milus Glazier printed the Rx but did not sign it, I have called it in to pharmacy

## 2013-06-10 NOTE — Telephone Encounter (Signed)
PATIENT'S DAUGHTER IS CALLING TO FIND OUT WHEN HER FATHER CAN GET A REFILL ON GENERIC AMBIEN 10 MG. SHE SAID SHE CALLED THE PHARMACY TO INQUIRE AND THEY TOLD HER THAT THEY REQUESTED THE REFILL 3 DAYS AGO, BUT HAVE NOT HEARD BACK FROM [place] YET. BEST PHONE [phone] (DAUGHTER'S NAME IS NATASHA KOBELEVA)   [facility] [address] ([address])    MBC

## 2013-06-11 ENCOUNTER — Telehealth: Payer: Self-pay | Admitting: Family Medicine

## 2013-06-11 NOTE — Telephone Encounter (Signed)
[facility]

## 2013-06-18 ENCOUNTER — Ambulatory Visit (INDEPENDENT_AMBULATORY_CARE_PROVIDER_SITE_OTHER): Payer: Medicare Other | Admitting: *Deleted

## 2013-06-18 DIAGNOSIS — I4891 Unspecified atrial fibrillation: Secondary | ICD-10-CM

## 2013-07-13 ENCOUNTER — Other Ambulatory Visit: Payer: Self-pay | Admitting: Cardiology

## 2013-07-16 ENCOUNTER — Ambulatory Visit (INDEPENDENT_AMBULATORY_CARE_PROVIDER_SITE_OTHER): Payer: Medicare Other | Admitting: *Deleted

## 2013-07-16 DIAGNOSIS — I4891 Unspecified atrial fibrillation: Secondary | ICD-10-CM

## 2013-07-16 LAB — POCT INR: INR: 3.2

## 2013-07-22 ENCOUNTER — Other Ambulatory Visit: Payer: Self-pay | Admitting: Family Medicine

## 2013-07-22 DIAGNOSIS — I5022 Chronic systolic (congestive) heart failure: Secondary | ICD-10-CM

## 2013-07-22 DIAGNOSIS — I2510 Atherosclerotic heart disease of native coronary artery without angina pectoris: Secondary | ICD-10-CM

## 2013-07-22 MED ORDER — FUROSEMIDE 40 MG PO TABS: ORAL_TABLET | ORAL | Status: DC

## 2013-07-30 ENCOUNTER — Other Ambulatory Visit: Payer: Self-pay

## 2013-07-30 ENCOUNTER — Ambulatory Visit (INDEPENDENT_AMBULATORY_CARE_PROVIDER_SITE_OTHER): Payer: Medicare Other | Admitting: General Practice

## 2013-07-30 DIAGNOSIS — I4891 Unspecified atrial fibrillation: Secondary | ICD-10-CM

## 2013-07-30 LAB — POCT INR: INR: 2.6

## 2013-08-15 ENCOUNTER — Other Ambulatory Visit: Payer: Self-pay | Admitting: Cardiology

## 2013-08-17 ENCOUNTER — Other Ambulatory Visit: Payer: Self-pay

## 2013-08-17 MED ORDER — POTASSIUM CHLORIDE ER 10 MEQ PO TBCR: 20.0000 meq | EXTENDED_RELEASE_TABLET | Freq: Every day | ORAL | Status: DC

## 2013-08-18 ENCOUNTER — Other Ambulatory Visit: Payer: Self-pay

## 2013-08-18 DIAGNOSIS — I4891 Unspecified atrial fibrillation: Secondary | ICD-10-CM

## 2013-08-18 DIAGNOSIS — E039 Hypothyroidism, unspecified: Secondary | ICD-10-CM

## 2013-08-18 MED ORDER — LEVOTHYROXINE SODIUM 100 MCG PO TABS: 100.0000 ug | ORAL_TABLET | Freq: Every day | ORAL | Status: DC

## 2013-08-18 MED ORDER — METOPROLOL SUCCINATE ER 25 MG PO TB24: ORAL_TABLET | ORAL | Status: DC

## 2013-08-19 ENCOUNTER — Encounter: Payer: Self-pay | Admitting: Cardiology

## 2013-08-19 ENCOUNTER — Ambulatory Visit (INDEPENDENT_AMBULATORY_CARE_PROVIDER_SITE_OTHER): Payer: Medicare Other | Admitting: Cardiology

## 2013-08-19 ENCOUNTER — Other Ambulatory Visit: Payer: Self-pay | Admitting: *Deleted

## 2013-08-19 ENCOUNTER — Ambulatory Visit (INDEPENDENT_AMBULATORY_CARE_PROVIDER_SITE_OTHER): Payer: Medicare Other | Admitting: *Deleted

## 2013-08-19 VITALS — BP 118/56 | HR 64 | Ht 64.0 in | Wt 141.0 lb

## 2013-08-19 DIAGNOSIS — R2681 Unsteadiness on feet: Secondary | ICD-10-CM

## 2013-08-19 DIAGNOSIS — I5022 Chronic systolic (congestive) heart failure: Secondary | ICD-10-CM

## 2013-08-19 DIAGNOSIS — R269 Unspecified abnormalities of gait and mobility: Secondary | ICD-10-CM

## 2013-08-19 DIAGNOSIS — R0602 Shortness of breath: Secondary | ICD-10-CM

## 2013-08-19 DIAGNOSIS — E785 Hyperlipidemia, unspecified: Secondary | ICD-10-CM

## 2013-08-19 DIAGNOSIS — I4891 Unspecified atrial fibrillation: Secondary | ICD-10-CM

## 2013-08-19 DIAGNOSIS — R7989 Other specified abnormal findings of blood chemistry: Secondary | ICD-10-CM

## 2013-08-19 DIAGNOSIS — I2510 Atherosclerotic heart disease of native coronary artery without angina pectoris: Secondary | ICD-10-CM

## 2013-08-19 DIAGNOSIS — D539 Nutritional anemia, unspecified: Secondary | ICD-10-CM

## 2013-08-19 LAB — HEPATIC FUNCTION PANEL
ALT: 43 U/L (ref 0–53)
AST: 50 U/L — ABNORMAL HIGH (ref 0–37)
Albumin: 3.6 g/dL (ref 3.5–5.2)
Alkaline Phosphatase: 45 U/L (ref 39–117)
Bilirubin, Direct: 0.1 mg/dL (ref 0.0–0.3)
Total Protein: 7.2 g/dL (ref 6.0–8.3)

## 2013-08-19 LAB — BRAIN NATRIURETIC PEPTIDE: Pro B Natriuretic peptide (BNP): 126 pg/mL — ABNORMAL HIGH (ref 0.0–100.0)

## 2013-08-19 LAB — BASIC METABOLIC PANEL
Calcium: 8.8 mg/dL (ref 8.4–10.5)
Chloride: 103 mEq/L (ref 96–112)
Creatinine, Ser: 1.5 mg/dL (ref 0.4–1.5)
GFR: 46.49 mL/min — ABNORMAL LOW (ref >60.00)

## 2013-08-19 LAB — POCT INR: INR: 2.6

## 2013-08-19 IMAGING — CR DG CHEST 2V
2 series · 2 of 2 positions shown · non-contrast
Comparison: 01/09/2013

CLINICAL DATA: Cough.

CHEST - 2 VIEW

[PA]
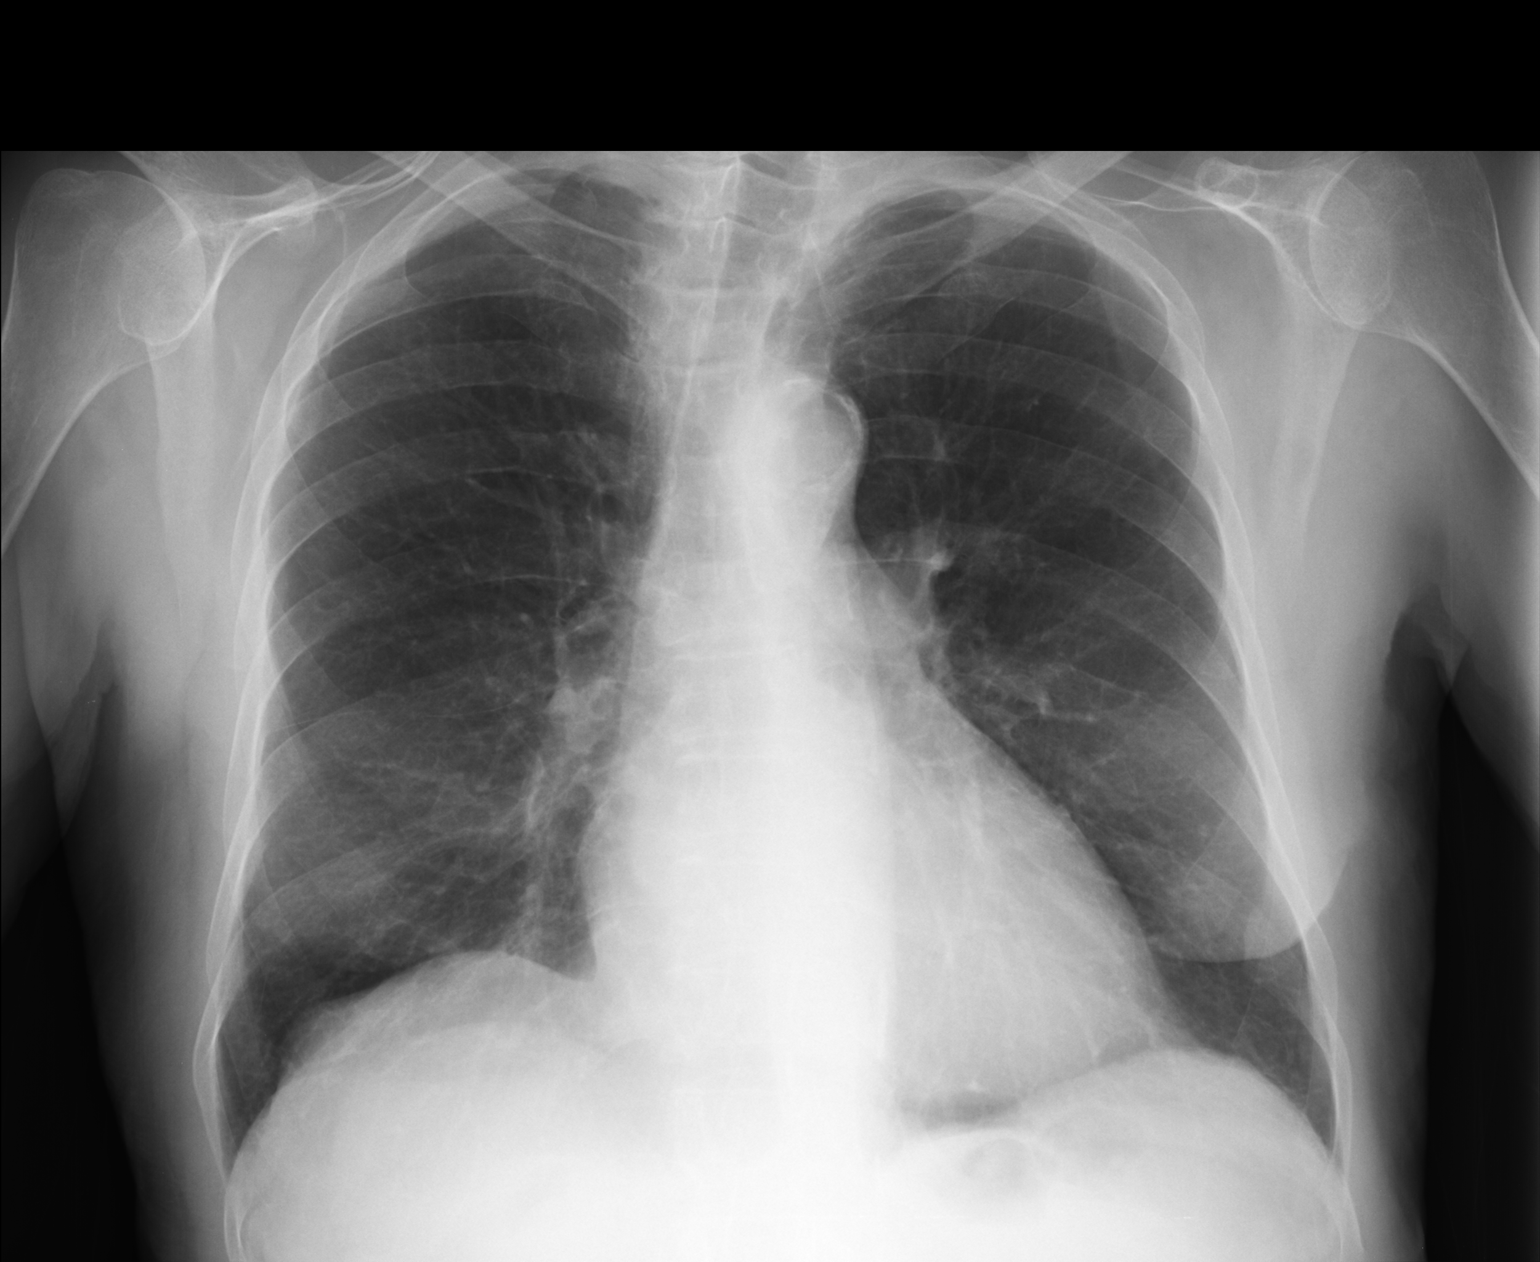

[lateral]
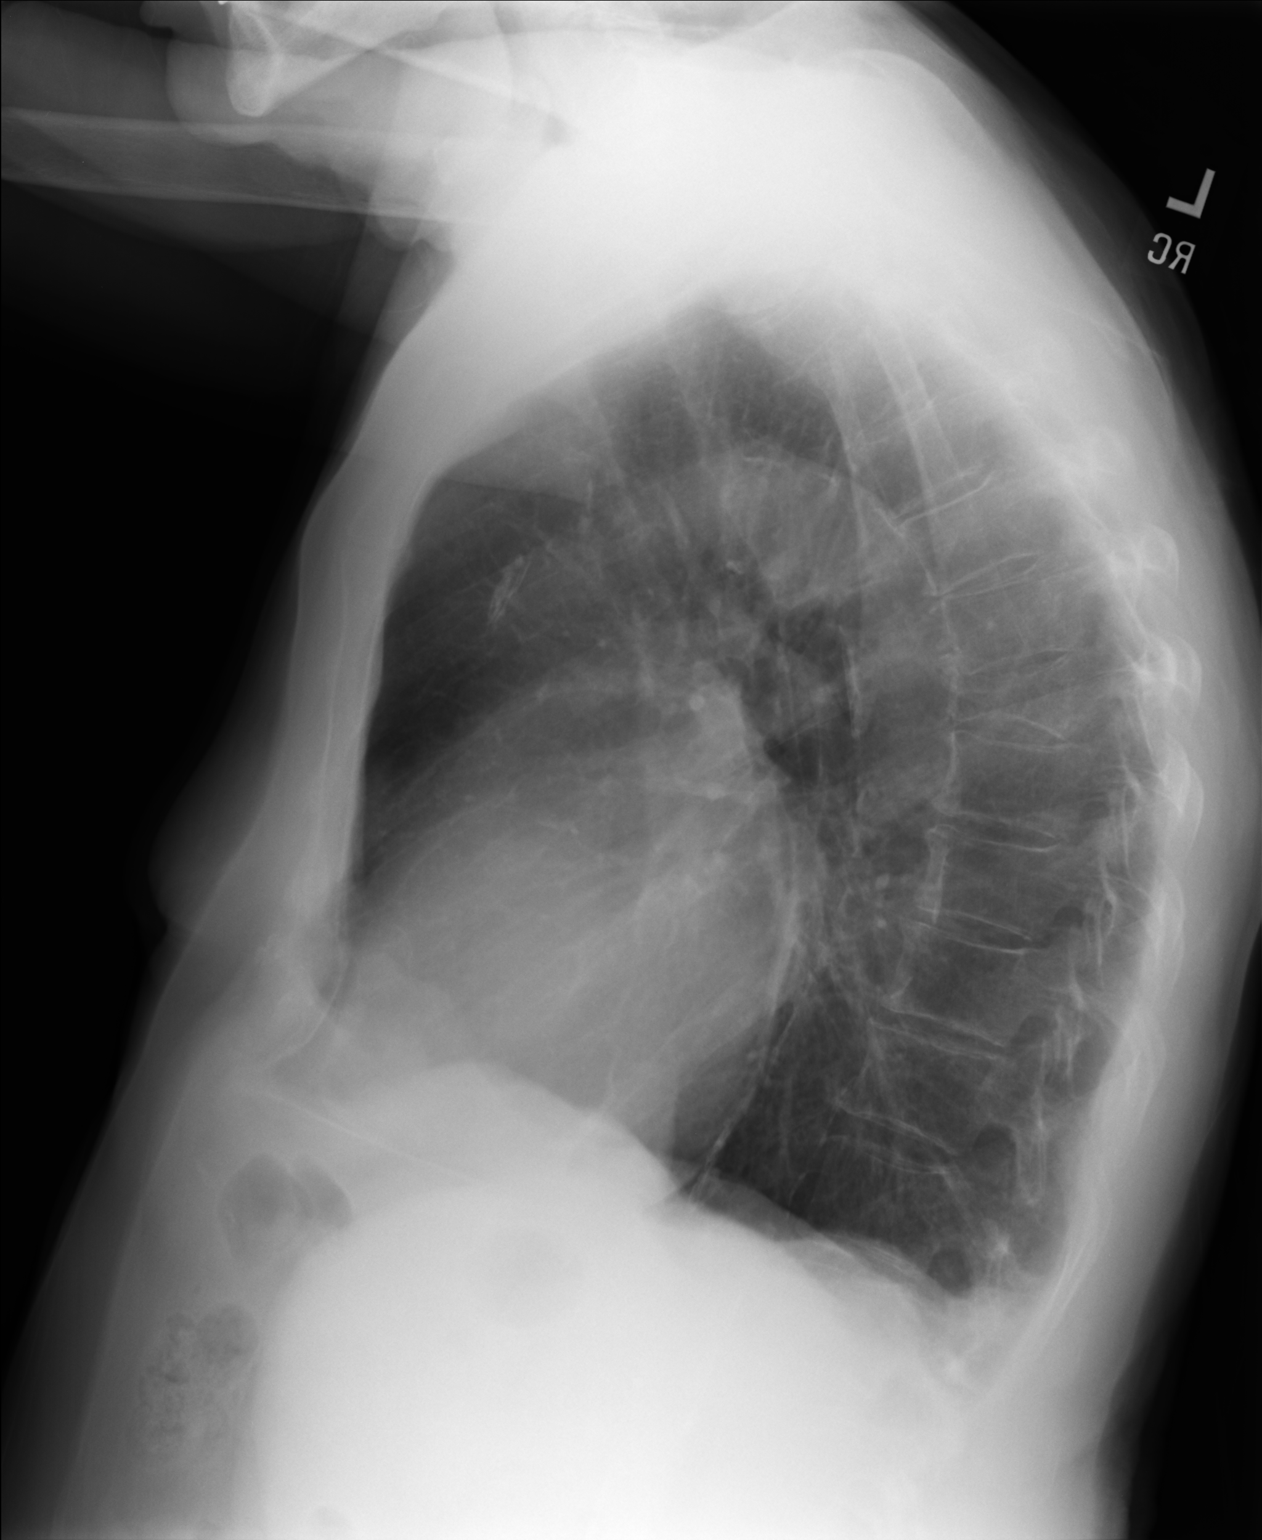

[2 of 2 positions shown; findings below may reference images not displayed]

FINDINGS: Stable COPD.  No infiltrate, edema, nodule or pleural
effusion is identified. The heart size is at the upper limits of
normal.  The bony thorax shows stable osteopenia and a stable mid
thoracic compression fracture. This is roughly at the T6 level.
IMPRESSION: Stable COPD.  No acute findings.  Stable mid thoracic compression
fracture.

Clinically significant discrepancy from primary report, if
provided: None

## 2013-08-19 MED ORDER — AMIODARONE HCL 200 MG PO TABS: ORAL_TABLET | ORAL | Status: DC

## 2013-08-19 NOTE — Progress Notes (Signed)
Patient ID: [name], male   DOB: 06/13/1926, 79 y.o.   MRN: [id number] PCP: Dr. Milus Glazier  79 yo with history of CAD, ischemic CMP, CKD, and paroxysmal atrial fibrillation/flutter presents for cardiology followup. He speaks little [place] and his son-in-law interprets.  No chest pain.  Patient has a history of myalgias with Crestor and Lipitor.  He has tolerated pravastatin without myalgias.  He walks about 1 mile most days without exertional dyspnea.  No chest pain.  In 4/14, I cardioverted him out of atypical atrial flutter.  He is in NSR today on amiodarone.  Weight is up 5 lbs.   Main complaint today is "dizziness."  On further questioning, this has been present for several years, may be getting gradually worse.  He has not fallen.  It does not seem to be vertigo or lightheadedness.  It seems that he is describing difficulty with balance.  He says that his feet feel numb.  He leans on his wife when they go on walks.  He is not orthostatic today.   Labs (9/11): K 4.1, creatinine 1.4  Labs (8/12): K 3.8, creatinine 1.3, TSH normal, LFTs normal, LDL 96, HDL 50 Labs (1/13): K 3.9, creatinine 1.3, TSH normal, LFTs normal Labs (2/13): K 3.8, creatinine 1.3 Labs (5/13): K 3.5, creatinine 1.4, BNP 331, LFTs normal, TSH normal Labs (9/13): LDL 85, HDL 53, BNP 547, LFTs normal Labs (12/13): K 4.3, creatinine 1.49 => 1.4, digoxin 1.4, TSH normal, LFTs normal Labs (1/14): digoxin 1.0, K 4.3, creatinine 1.5 Labs (3/14): free T4 normal Labs (4/14): K 5=> 3.8, creatinine 1.8 => 1.4, digoxin 0.6 Labs (5/14): TSH normal, digoxin level normal Labs (8/14): K 4.2, creatinine 1.66, AST 42, ALT 37 Labs (9/14): K 4, creatinine 1.6, digoxin 1.3, LDL 104, HDL 51, AST 41, ALT 30  ECG: NSR, right axis deviation, QTc 486 msec  Allergies (verified):  1) ! Crestor  2) Vytorin  3) Atenolol   Past Medical History:  1. Coronary artery disease: LHC 4/06 with 60% pLAD, 40% ramus, 60% mRCA, and 90% mPDA. No  intervention. Inferior akinesis on LV-gram was suggestive of possible inferior MI with recanalization.  2. Hyperlipidemia: Myalgias with Lipitor and Crestor.  3. Hypothyroidism  4. Peripheral vascular disease  5. Anxiety  6. Skin cancer, hx of 2008 BCC  7. Colonic polyps, hx of  8. Paroxysmal atrial fibrillation with rapid ventricular response treated with amiodarone therapy converting to normal sinus rhythm 02/2009.  He has also had atypical atrial flutter noted in 4/14, cardioverted to NSR in 4/14.  9. Systolic CHF: Probable ischemic cardiomyopathy. Echo (6/10) with EF 40%, mildly dilated LV, moderate MR.  Echo (3/12): EF 30-35%, inferolateral severe hypokinesis, trivial AI, moderate to severe MR.  10. History of PNA  11. CKD. Baseline creatinine around 1.4.  12. Mitral regurgitation: Moderate by echo 6/10. 2-3+ by cath 2006.  Moderate-severe by echo in 3/12.  13. PVCs  14. BPH 15. Recurrent UTIs 16. Imbalance  Family History:  Family History of CAD Male 1st degree relative <60   Social History:  Retired  Originally from [place], speaks little English  Married  Former Smoker   ROS: All systems reviewed and negative except as per HPI.   Current Outpatient Prescriptions  Medication Sig Dispense Refill  . ALPRAZolam (XANAX) 0.25 MG tablet 1 as needed every 12 hours as needed. Do not take if you use Ambien  10 tablet  1  . amiodarone (PACERONE) 200 MG tablet 1/2 tablet (total  100mg  ) daily  45 tablet  3  . co-enzyme Q-10 30 MG capsule Take 100 mg by mouth daily.       . digoxin (LANOXIN) 0.125 MG tablet 0.125 mg every other day. 1 every other day      . digoxin (LANOXIN) 0.125 MG tablet Take 1 tablet (0.125 mg total) by mouth every other day.  15 tablet  0  . furosemide (LASIX) 40 MG tablet 1 tablet (total 40mg ) in the AM  90 tablet  3  . levothyroxine (SYNTHROID, LEVOTHROID) 100 MCG tablet Take 1 tablet (100 mcg total) by mouth daily. PATIENT NEEDS OFFICE VISIT FOR ADDITIONAL  REFILLS  90 tablet  0  . lisinopril (PRINIVIL,ZESTRIL) 5 MG tablet Take 1 tablet (5 mg total) by mouth daily.  90 tablet  3  . metoprolol succinate (TOPROL-XL) 25 MG 24 hr tablet 1/2 tablet (total 12.5mg ) two times a day. PATIENT NEEDS OFFICE VISIT FOR ADDITIONAL REFILLS  90 tablet  0  . Multiple Vitamin (MULTIVITAMIN) tablet Take 1 tablet by mouth daily.      . Omega-3 Fatty Acids (FISH OIL) 1200 MG CAPS Take 1 capsule by mouth daily.       . potassium chloride (K-DUR) 10 MEQ tablet Take 2 tablets (20 mEq total) by mouth daily.  180 tablet  0  . pravastatin (PRAVACHOL) 80 MG tablet TAKE 1 TABLET (80 MG TOTAL) BY MOUTH DAILY.  90 tablet  0  . sulfamethoxazole-trimethoprim (BACTRIM DS) 800-160 MG per tablet Take 1 tablet by mouth 2 (two) times daily.  14 tablet  0  . Tamsulosin HCl (FLOMAX) 0.4 MG CAPS Take 1 capsule (0.4 mg total) by mouth daily.  90 capsule  3  . warfarin (COUMADIN) 1 MG tablet Take as directed by anticoagulation clinic  60 tablet  3  . zolpidem (AMBIEN) 10 MG tablet TAKE 1 TABLET BY MOUTH AT BEDTIME AS NEEDED  30 tablet  5   No current facility-administered medications for this visit.    BP 118/56  Pulse 64  Ht 5\' 4"  (1.626 m)  Wt 141 lb (63.957 kg)  BMI 24.19 kg/m2 General: NAD Neck: No JVD; no thyromegaly or thyroid nodule.  Lungs: CTAB  CV: Nondisplaced PMI.  Heart mildly tachy, regular S1/S2, no S3/S4, 1/6 HSM at apex.  No edema.  No carotid bruit. Abdomen: Soft, nontender, no hepatosplenomegaly, no distention.  Neurologic: Alert and oriented x 3.  Psych: Normal affect. Extremities: No clubbing or cyanosis.   Assessment/Plan: 1. ATRIAL FLUTTER  Patient remains in NSR after cardioversion. He will continue warfarin.  Given balance difficulty/dizziness, I am going to decrease amiodarone to 100 mg daily in case this is playing a role in his symptoms.  Will check LFTs and TSH today.  He will need yearly eye exams with amiodarone.  2. HYPERLIPIDEMIA  He is tolerating  pravastatin without myalgias. Had myalgias with Lipitor and Crestor.  LDL not ideal, but will continue current pravastatin as he tolerates it.   3. MITRAL INSUFFICIENCY Last echo showed moderate to severe MR. Given age and medical condition, would plan medical management.  4. SYSTOLIC HEART FAILURE, CHRONIC  EF 30-35% on last echo. Breathing is stable, NYHA class II symptoms.  He does not appear volume overloaded on exam.  - Continue digoxin every other day.  Will recheck level today (was mildly elevated in 9/14). - Continue current doses of Toprol XL and lisinopril.  - Continue current dose of Lasix.  - Would avoid ICD at  his age.  5. Imbalance Possibly related to peripheral neuropathy.  He is not a diabetic.  Will check B12 level. He needs to walk with a cane.   Followup in 3 months  Marca Ancona 08/19/2013

## 2013-08-19 NOTE — Patient Instructions (Addendum)
Decrease amiodarone to 100mg  daily. This will be 1/2 of a 200mg  tablet daily.  Your physician recommends that you return for lab work today--Liver profile/TSH/Digoxin level/BMET/BNP.  Your physician recommends that you schedule a follow-up appointment in: 3 months with Dr Shirlee Latch.

## 2013-08-24 ENCOUNTER — Telehealth: Payer: Self-pay | Admitting: Radiology

## 2013-08-24 ENCOUNTER — Other Ambulatory Visit: Payer: Medicare Other

## 2013-08-24 ENCOUNTER — Other Ambulatory Visit: Payer: Self-pay | Admitting: *Deleted

## 2013-08-24 ENCOUNTER — Telehealth: Payer: Self-pay | Admitting: *Deleted

## 2013-08-24 ENCOUNTER — Other Ambulatory Visit (INDEPENDENT_AMBULATORY_CARE_PROVIDER_SITE_OTHER): Payer: Medicare Other

## 2013-08-24 DIAGNOSIS — E039 Hypothyroidism, unspecified: Secondary | ICD-10-CM

## 2013-08-24 DIAGNOSIS — R5381 Other malaise: Secondary | ICD-10-CM

## 2013-08-24 DIAGNOSIS — R6889 Other general symptoms and signs: Secondary | ICD-10-CM

## 2013-08-24 DIAGNOSIS — E785 Hyperlipidemia, unspecified: Secondary | ICD-10-CM

## 2013-08-24 DIAGNOSIS — E538 Deficiency of other specified B group vitamins: Secondary | ICD-10-CM

## 2013-08-24 DIAGNOSIS — I2510 Atherosclerotic heart disease of native coronary artery without angina pectoris: Secondary | ICD-10-CM

## 2013-08-24 DIAGNOSIS — R7989 Other specified abnormal findings of blood chemistry: Secondary | ICD-10-CM

## 2013-08-24 DIAGNOSIS — Z5181 Encounter for therapeutic drug level monitoring: Secondary | ICD-10-CM

## 2013-08-24 DIAGNOSIS — I4891 Unspecified atrial fibrillation: Secondary | ICD-10-CM

## 2013-08-24 DIAGNOSIS — D539 Nutritional anemia, unspecified: Secondary | ICD-10-CM

## 2013-08-24 DIAGNOSIS — R899 Unspecified abnormal finding in specimens from other organs, systems and tissues: Secondary | ICD-10-CM

## 2013-08-24 LAB — T4, FREE: Free T4: 1.13 ng/dL (ref 0.60–1.60)

## 2013-08-24 MED ORDER — LEVOTHYROXINE SODIUM 125 MCG PO TABS: 125.0000 ug | ORAL_TABLET | Freq: Every day | ORAL | Status: DC

## 2013-08-24 NOTE — Telephone Encounter (Signed)
I have been unable to place  an order in  [facility] for B12 Level. I am getting a stop that states Medicare will not allow more than 1 per year.  I have discussed with pt's son in law. He is aware I can order this but pt would need to sign a waiver. Son in law with check  Dr Milus Glazier,  patient's PCP about this.

## 2013-08-24 NOTE — Telephone Encounter (Signed)
[place] wants to know if you can put in order for a B12 on patient. They can not get the order in the computer. Will you order?

## 2013-08-25 NOTE — Telephone Encounter (Signed)
I don't know what they want.  Is it a B12 level, a B12 shot.   What is their problem with the computer???

## 2013-08-26 ENCOUNTER — Other Ambulatory Visit: Payer: Medicare Other

## 2013-08-26 NOTE — Telephone Encounter (Signed)
Yes, if you had read my message, you would see it does look like they ordered it yesterday, through epic, after they called here, so they must have gotten the order to take.

## 2013-08-26 NOTE — Telephone Encounter (Signed)
EPIC computer will not allow my writing the order stating it has already been checked earlier this year.

## 2013-08-26 NOTE — Telephone Encounter (Signed)
They were asking for a B12 level, when they called back, I advised if they would like Dr Milus Glazier to order, patient may come here for the lab. It does look like they have ordered it there, it is pending.

## 2013-08-27 ENCOUNTER — Other Ambulatory Visit: Payer: Self-pay | Admitting: Family Medicine

## 2013-08-27 ENCOUNTER — Telehealth: Payer: Self-pay | Admitting: *Deleted

## 2013-08-27 DIAGNOSIS — I2510 Atherosclerotic heart disease of native coronary artery without angina pectoris: Secondary | ICD-10-CM

## 2013-08-27 DIAGNOSIS — E538 Deficiency of other specified B group vitamins: Secondary | ICD-10-CM

## 2013-08-27 MED ORDER — CENTRUM SILVER ADULT 50+ PO TABS: 1.0000 | ORAL_TABLET | Freq: Every day | ORAL | Status: DC

## 2013-08-27 MED ORDER — FISH OIL 1200 MG PO CAPS
1.0000 | ORAL_CAPSULE | Freq: Every day | ORAL | Status: DC
Start: 2013-08-27 — End: 2015-07-07

## 2013-08-27 MED ORDER — FISH OIL 1200 MG PO CAPS: 1.0000 | ORAL_CAPSULE | Freq: Every day | ORAL | Status: DC

## 2013-08-27 MED ORDER — COENZYME Q10 30 MG PO CAPS: 100.0000 mg | ORAL_CAPSULE | Freq: Every day | ORAL | Status: DC

## 2013-08-27 NOTE — Telephone Encounter (Signed)
Faxed prescriptions to [facility], per Dr Milus Glazier. Confirmation page received.

## 2013-09-02 ENCOUNTER — Other Ambulatory Visit: Payer: Self-pay | Admitting: Cardiology

## 2013-09-15 ENCOUNTER — Other Ambulatory Visit: Payer: Self-pay | Admitting: Cardiology

## 2013-09-15 ENCOUNTER — Ambulatory Visit (INDEPENDENT_AMBULATORY_CARE_PROVIDER_SITE_OTHER): Payer: Medicare Other | Admitting: *Deleted

## 2013-09-15 DIAGNOSIS — I4891 Unspecified atrial fibrillation: Secondary | ICD-10-CM

## 2013-09-15 LAB — POCT INR: INR: 3.5

## 2013-09-18 ENCOUNTER — Encounter: Payer: Self-pay | Admitting: Cardiology

## 2013-09-28 ENCOUNTER — Ambulatory Visit (INDEPENDENT_AMBULATORY_CARE_PROVIDER_SITE_OTHER): Payer: Medicare Other | Admitting: *Deleted

## 2013-09-28 DIAGNOSIS — I4891 Unspecified atrial fibrillation: Secondary | ICD-10-CM

## 2013-09-28 LAB — POCT INR: INR: 2.9

## 2013-10-11 ENCOUNTER — Other Ambulatory Visit: Payer: Self-pay | Admitting: Family Medicine

## 2013-10-11 ENCOUNTER — Other Ambulatory Visit: Payer: Self-pay | Admitting: Cardiology

## 2013-10-13 ENCOUNTER — Other Ambulatory Visit: Payer: Self-pay | Admitting: Cardiology

## 2013-10-13 ENCOUNTER — Ambulatory Visit (INDEPENDENT_AMBULATORY_CARE_PROVIDER_SITE_OTHER): Payer: Medicare Other | Admitting: *Deleted

## 2013-10-13 DIAGNOSIS — I4891 Unspecified atrial fibrillation: Secondary | ICD-10-CM

## 2013-10-13 LAB — POCT INR: INR: 2.9

## 2013-10-14 ENCOUNTER — Other Ambulatory Visit: Payer: Self-pay | Admitting: Dermatology

## 2013-10-16 ENCOUNTER — Encounter: Payer: Self-pay | Admitting: Cardiology

## 2013-10-16 ENCOUNTER — Telehealth: Payer: Self-pay | Admitting: *Deleted

## 2013-10-16 ENCOUNTER — Ambulatory Visit (INDEPENDENT_AMBULATORY_CARE_PROVIDER_SITE_OTHER): Payer: Medicare Other | Admitting: *Deleted

## 2013-10-16 VITALS — BP 100/60 | HR 74 | Resp 18

## 2013-10-16 DIAGNOSIS — R0602 Shortness of breath: Secondary | ICD-10-CM

## 2013-10-16 DIAGNOSIS — D539 Nutritional anemia, unspecified: Secondary | ICD-10-CM

## 2013-10-16 DIAGNOSIS — I4891 Unspecified atrial fibrillation: Secondary | ICD-10-CM

## 2013-10-16 NOTE — Telephone Encounter (Signed)
Called patient's daughter and she will bring him in the office today for an EKG to check his rhythm.

## 2013-10-16 NOTE — Telephone Encounter (Signed)
Message copied by Merilyn Baba on Fri Oct 16, 2013 12:00 PM ------      Message from: Larey Dresser      Created: Fri Oct 16, 2013 11:10 AM       Please have this patient come today to get an ECG to make sure he is not back in atrial fibrillation.  ------

## 2013-10-16 NOTE — Progress Notes (Signed)
Patient brought in to office for an EKG. Heart rate 74 and BP 100/60. Patient has no complaints. EKG reviewed by Dr.Hochrein (DOD) and he advised that it shows atypical flutter. Dr.Mclean had instucted the patient's daughter to have him increase the Amiodarone to 200mg  every day.   The son in law was here with him and he will make sure that this is done. Dr.Hochrein aware of the dose change and recommended to send the EKG to Dr.Mclean on 1/26 to review and then follow up with the patient. Will forward to [place].

## 2013-10-16 NOTE — Telephone Encounter (Signed)
Called patient's daughter Ronny Bacon). She states that her father is concerned that his heart rate is in the 70's instead of the 50's and thinks he should increase the Amiodarone. Ronny Bacon states that he feels fine without any complaints and pulse is regular. Advised her not to increase the Amiodarone. She is requesting an appointment with Dr.McLean on 1/26 because her father would like to "be checked because he is always nervous". Advised her that his schedule is booked so I could not schedule an appointment for him but would send a message to [place]. She is also aware that she can call at night or on the weekend for advice at any time.

## 2013-10-16 NOTE — Patient Instructions (Signed)
Patient and family aware that they will get a follow up phone call next week for treatment plan.

## 2013-10-19 ENCOUNTER — Encounter: Payer: Self-pay | Admitting: Cardiology

## 2013-10-19 ENCOUNTER — Ambulatory Visit (INDEPENDENT_AMBULATORY_CARE_PROVIDER_SITE_OTHER): Payer: Medicare Other | Admitting: Pharmacist

## 2013-10-19 ENCOUNTER — Ambulatory Visit (INDEPENDENT_AMBULATORY_CARE_PROVIDER_SITE_OTHER): Payer: Medicare Other | Admitting: Cardiology

## 2013-10-19 VITALS — BP 118/56 | HR 79 | Ht 64.0 in | Wt 137.0 lb

## 2013-10-19 DIAGNOSIS — I4891 Unspecified atrial fibrillation: Secondary | ICD-10-CM

## 2013-10-19 DIAGNOSIS — I5022 Chronic systolic (congestive) heart failure: Secondary | ICD-10-CM

## 2013-10-19 DIAGNOSIS — I4892 Unspecified atrial flutter: Secondary | ICD-10-CM

## 2013-10-19 DIAGNOSIS — I2510 Atherosclerotic heart disease of native coronary artery without angina pectoris: Secondary | ICD-10-CM

## 2013-10-19 LAB — POCT INR: INR: 2.9

## 2013-10-19 NOTE — Progress Notes (Signed)
Patient ID: [name], male   DOB: [birth year], 80 y.o.   MRN: [id number] PCP: Dr. Joseph Art  80 yo with history of CAD, ischemic CMP, CKD, and paroxysmal atrial fibrillation/flutter presents for cardiology followup. He speaks little [place] and his daughter interprets.  No chest pain.  Patient has a history of myalgias with Crestor and Lipitor.  He has tolerated pravastatin without myalgias.  He walks about 1 mile most days without exertional dyspnea.  No chest pain.  He has not been having as much trouble with dizziness lately.  In 4/14, I cardioverted him out of atypical atrial flutter.  At last appointment, he was in NSR.  However, he is back in atypical atrial flutter with HR 79 today.   Labs (9/11): K 4.1, creatinine 1.4  Labs (8/12): K 3.8, creatinine 1.3, TSH normal, LFTs normal, LDL 96, HDL 50 Labs (1/13): K 3.9, creatinine 1.3, TSH normal, LFTs normal Labs (2/13): K 3.8, creatinine 1.3 Labs (5/13): K 3.5, creatinine 1.4, BNP 331, LFTs normal, TSH normal Labs (9/13): LDL 85, HDL 53, BNP 547, LFTs normal Labs (12/13): K 4.3, creatinine 1.49 => 1.4, digoxin 1.4, TSH normal, LFTs normal Labs (1/14): digoxin 1.0, K 4.3, creatinine 1.5 Labs (3/14): free T4 normal Labs (4/14): K 5=> 3.8, creatinine 1.8 => 1.4, digoxin 0.6 Labs (5/14): TSH normal, digoxin level normal Labs (8/14): K 4.2, creatinine 1.66, AST 42, ALT 37 Labs (9/14): K 4, creatinine 1.6, digoxin 1.3, LDL 104, HDL 51, AST 41, ALT 30 Labs (11/14): K 3.6, creatinine 1.5, BNP 126 Labs (12/14): TSH 6.93  ECG: atypical atrial flutter with 2:1 block HR 79, inferior small Qs, inferior TWIs, iRBBB, right axis deviation.   Allergies (verified):  1) ! Crestor  2) Vytorin  3) Atenolol   Past Medical History:  1. Coronary artery disease: LHC 4/06 with 60% pLAD, 40% ramus, 60% mRCA, and 90% mPDA. No intervention. Inferior akinesis on LV-gram was suggestive of possible inferior MI with recanalization.  2. Hyperlipidemia: Myalgias  with Lipitor and Crestor.  3. Hypothyroidism  4. Peripheral vascular disease  5. Anxiety  6. Skin cancer, hx of 2008 BCC  7. Colonic polyps, hx of  8. Paroxysmal atrial fibrillation with rapid ventricular response treated with amiodarone therapy converting to normal sinus rhythm 02/2009.  He has also had atypical atrial flutter noted in 4/14, cardioverted to NSR in 4/14. In atypical flutter again in 0000000.  9. Systolic CHF: Probable ischemic cardiomyopathy. Echo (6/10) with EF 40%, mildly dilated LV, moderate MR.  Echo (3/12): EF 30-35%, inferolateral severe hypokinesis, trivial AI, moderate to severe MR.  10. History of PNA  11. CKD. Baseline creatinine around 1.4.  12. Mitral regurgitation: Moderate by echo 6/10. 2-3+ by cath 2006.  Moderate-severe by echo in 3/12.  13. PVCs  14. BPH 15. Recurrent UTIs 16. Imbalance  Family History:  Family History of CAD Male 1st degree relative <60   Social History:  Retired  Originally from [place], speaks little [place]  Married  Former Smoker   ROS: All systems reviewed and negative except as per HPI.   Current Outpatient Prescriptions  Medication Sig Dispense Refill  . ALPRAZolam (XANAX) 0.25 MG tablet 1 as needed every 12 hours as needed. Do not take if you use Ambien  10 tablet  1  . amiodarone (PACERONE) 200 MG tablet Take 1 tablet (200 mg total) by mouth daily.      Marland Kitchen co-enzyme Q-10 30 MG capsule Take 3 capsules (90 mg total) by mouth  daily.  90 capsule  3  . digoxin (LANOXIN) 0.125 MG tablet 0.125 mg every other day. 1 every other day      . digoxin (LANOXIN) 0.125 MG tablet TAKE 1 TABLET (0.125 MG TOTAL) BY MOUTH EVERY OTHER DAY.  15 tablet  0  . furosemide (LASIX) 40 MG tablet 1 tablet (total 40mg ) in the AM  90 tablet  3  . levothyroxine (SYNTHROID) 125 MCG tablet Take 1 tablet (125 mcg total) by mouth daily before breakfast.  90 tablet  1  . lisinopril (PRINIVIL,ZESTRIL) 5 MG tablet Take 1 tablet (5 mg total) by mouth daily.  90  tablet  3  . metoprolol succinate (TOPROL-XL) 25 MG 24 hr tablet TAKE 1/2 TABLET (TOTAL 12.5MG ) TWO TIMES A DAY  90 tablet  0  . Multiple Vitamin (MULTIVITAMIN) tablet Take 1 tablet by mouth daily.      . Multiple Vitamins-Minerals (CENTRUM SILVER ADULT 50+) TABS Take 1 tablet by mouth daily.  90 tablet  3  . Omega-3 Fatty Acids (FISH OIL) 1200 MG CAPS Take 1 capsule (1,200 mg total) by mouth daily.  90 capsule  3  . potassium chloride (K-DUR) 10 MEQ tablet Take 2 tablets (20 mEq total) by mouth daily.  180 tablet  0  . pravastatin (PRAVACHOL) 80 MG tablet TAKE 1 TABLET (80 MG TOTAL) BY MOUTH DAILY.  90 tablet  0  . sulfamethoxazole-trimethoprim (BACTRIM DS) 800-160 MG per tablet Take 1 tablet by mouth 2 (two) times daily.  14 tablet  0  . Tamsulosin HCl (FLOMAX) 0.4 MG CAPS Take 1 capsule (0.4 mg total) by mouth daily.  90 capsule  3  . warfarin (COUMADIN) 1 MG tablet Take as directed by anticoagulation clinic  60 tablet  3  . warfarin (COUMADIN) 1 MG tablet TAKE AS DIRECTED BY ANTICOAGULATION CLINIC  60 tablet  3  . zolpidem (AMBIEN) 10 MG tablet TAKE 1 TABLET BY MOUTH AT BEDTIME AS NEEDED  30 tablet  5   No current facility-administered medications for this visit.    BP 118/56  Pulse 79  Ht 5\' 4"  (1.626 m)  Wt 62.143 kg (137 lb)  BMI 23.50 kg/m2 General: NAD Neck: No JVD; no thyromegaly or thyroid nodule.  Lungs: CTAB  CV: Nondisplaced PMI.  Heart regular S1/S2, no S3/S4, 1/6 HSM at apex.  No edema.  No carotid bruit. Abdomen: Soft, nontender, no hepatosplenomegaly, no distention.  Neurologic: Alert and oriented x 3.  Psych: Normal affect. Extremities: No clubbing or cyanosis.   Assessment/Plan: 1. ATRIAL FLUTTER  Patient is back in atypical atrial flutter today.  Rate is controlled and he really is minimally symptomatic. - Amiodarone has been increased to 200 mg daily.  Will see if he will go back into NSR.  Will check LFTs and TSH in 2/15.  He will need yearly eye exams with  amiodarone.  - I do not think that it is urgent or necessary to cardiovert him at this time given lack of significant symptoms or tachycardia.  He is anticoagulated.  I will see him back in 2 wks to see if his symptoms have worsened at all.  As long as INR remains therapeutic, he can have DCCV in the future without TEE. 2. HYPERLIPIDEMIA  He is tolerating pravastatin without myalgias. Had myalgias with Lipitor and Crestor.  LDL not ideal, but will continue current pravastatin as he tolerates it.   3. MITRAL INSUFFICIENCY Last echo showed moderate to severe MR. Given age and  medical condition, would plan medical management.  4. SYSTOLIC HEART FAILURE, CHRONIC  EF 30-35% on last echo. Breathing is stable, NYHA class II symptoms.  He does not appear volume overloaded on exam.  - Continue digoxin every other day.  Will recheck level today with next labs. - Continue current doses of Toprol XL and lisinopril.  - Continue current dose of Lasix.  - Would avoid ICD at his age.  5. Imbalance Possibly related to peripheral neuropathy.    Followup in 3 months  Loralie Champagne 10/19/2013

## 2013-10-19 NOTE — Patient Instructions (Addendum)
Please come about 8:45AM before your 11/03/13 9:10 AM appt with Dr Aundra Dubin for your coumadin appt.  Stay on amiodarone 200mg  daily.   Your physician recommends that you return for lab work on Friday October 30, 2013. The lab opens at 7:30AM.

## 2013-10-21 ENCOUNTER — Encounter: Payer: Self-pay | Admitting: Cardiology

## 2013-10-21 ENCOUNTER — Telehealth: Payer: Self-pay | Admitting: Cardiology

## 2013-10-21 DIAGNOSIS — I4891 Unspecified atrial fibrillation: Secondary | ICD-10-CM

## 2013-10-21 NOTE — Telephone Encounter (Signed)
New message          Pt would like to do cardioversion on Friday 1/30. Please give pt daughter a call.

## 2013-10-21 NOTE — Telephone Encounter (Signed)
Daughter of patient states that the family has discussed the cardioversion and they would like to have it done this Friday by Dr. Aundra Dubin. Dr. Aundra Dubin is out of the office today and is not scheduled for hospital on Friday. Explained to daughter/patient that Dr. Aundra Dubin will be notified regarding his availability. Daughter states that patient's heart rate continues to be irregular and going about 100 bpm. Advised we will call back after determining Dr. Claris Gladden availability.  Dr. Aundra Dubin reviewed note/question. Stated that the plan was for patient to return to see Dr. Aundra Dubin in a couple of weeks, not to proceed with the cardioversion yet.   Called daughter/patient to inform them of Dr. Claris Gladden comment above regarding follow up appointment and delay of cardioversion at this time. Daughter states that they are concerned because they feel he is getting worse. She reported that he fell last night due to the lightheadedness. She states he is SOB moreso than before and they are concerned to wait until 11/03/13 for follow up appointment with Dr. Aundra Dubin. Routed additional concerns to Dr. Aundra Dubin.    Dr. Aundra Dubin advises to schedule DCCV for 10/23/13. Patient may come in on 10/23/13 AM prior to DCCV for labs at office to include INR.   Spoke to patient's daughter/patient.  Patient agreeable to 10/23/13 DCCV at 1100. Will come into office for stat labs at 0830 (BMET, INR/PT, CBC), then go to hospital for procedure with Dr. Aundra Dubin. Instructions given to patient's daughter over the phone since letter would not arrive in time for 10/23/13 procedure date. Verbalized understanding and appreciation.

## 2013-10-22 ENCOUNTER — Other Ambulatory Visit: Payer: Self-pay | Admitting: Family Medicine

## 2013-10-22 NOTE — Telephone Encounter (Signed)
F/u   Pt's daughter has questions about his cardio version tomorrow. Please call pt's daughter.

## 2013-10-22 NOTE — Telephone Encounter (Signed)
Spoke with Ronny Bacon

## 2013-10-23 ENCOUNTER — Encounter (HOSPITAL_COMMUNITY): Payer: Medicare Other | Admitting: Certified Registered Nurse Anesthetist

## 2013-10-23 ENCOUNTER — Ambulatory Visit (HOSPITAL_COMMUNITY)
Admission: RE | Admit: 2013-10-23 | Discharge: 2013-10-23 | Disposition: A | Discharge status: Home / Self Care | Payer: Medicare Other | Source: Ambulatory Visit | Attending: Cardiology | Admitting: Cardiology

## 2013-10-23 ENCOUNTER — Ambulatory Visit (INDEPENDENT_AMBULATORY_CARE_PROVIDER_SITE_OTHER): Payer: Medicare Other | Admitting: *Deleted

## 2013-10-23 ENCOUNTER — Ambulatory Visit (HOSPITAL_COMMUNITY): Payer: Medicare Other | Admitting: Certified Registered Nurse Anesthetist

## 2013-10-23 ENCOUNTER — Encounter (HOSPITAL_COMMUNITY)
Admission: RE | Disposition: A | Discharge status: Home / Self Care | Payer: Self-pay | Source: Ambulatory Visit | Attending: Cardiology

## 2013-10-23 ENCOUNTER — Other Ambulatory Visit (HOSPITAL_COMMUNITY): Payer: Self-pay | Admitting: Cardiology

## 2013-10-23 DIAGNOSIS — I059 Rheumatic mitral valve disease, unspecified: Secondary | ICD-10-CM | POA: Insufficient documentation

## 2013-10-23 DIAGNOSIS — E039 Hypothyroidism, unspecified: Secondary | ICD-10-CM | POA: Insufficient documentation

## 2013-10-23 DIAGNOSIS — I4892 Unspecified atrial flutter: Secondary | ICD-10-CM

## 2013-10-23 DIAGNOSIS — Z79899 Other long term (current) drug therapy: Secondary | ICD-10-CM | POA: Insufficient documentation

## 2013-10-23 DIAGNOSIS — I5022 Chronic systolic (congestive) heart failure: Secondary | ICD-10-CM | POA: Insufficient documentation

## 2013-10-23 DIAGNOSIS — I509 Heart failure, unspecified: Secondary | ICD-10-CM | POA: Insufficient documentation

## 2013-10-23 DIAGNOSIS — I4891 Unspecified atrial fibrillation: Secondary | ICD-10-CM | POA: Insufficient documentation

## 2013-10-23 DIAGNOSIS — Z87891 Personal history of nicotine dependence: Secondary | ICD-10-CM | POA: Insufficient documentation

## 2013-10-23 DIAGNOSIS — Z7901 Long term (current) use of anticoagulants: Secondary | ICD-10-CM | POA: Insufficient documentation

## 2013-10-23 DIAGNOSIS — N189 Chronic kidney disease, unspecified: Secondary | ICD-10-CM | POA: Insufficient documentation

## 2013-10-23 DIAGNOSIS — E785 Hyperlipidemia, unspecified: Secondary | ICD-10-CM | POA: Insufficient documentation

## 2013-10-23 DIAGNOSIS — I2589 Other forms of chronic ischemic heart disease: Secondary | ICD-10-CM | POA: Insufficient documentation

## 2013-10-23 DIAGNOSIS — I2510 Atherosclerotic heart disease of native coronary artery without angina pectoris: Secondary | ICD-10-CM | POA: Insufficient documentation

## 2013-10-23 HISTORY — PX: CARDIOVERSION: SHX1299

## 2013-10-23 LAB — BASIC METABOLIC PANEL
BUN: 26 mg/dL — ABNORMAL HIGH (ref 6–23)
CALCIUM: 8.8 mg/dL (ref 8.4–10.5)
CHLORIDE: 101 meq/L (ref 96–112)
CO2: 24 meq/L (ref 19–32)
Creatinine, Ser: 1.48 mg/dL — ABNORMAL HIGH (ref 0.50–1.35)
GFR calc Af Amer: 48 mL/min — ABNORMAL LOW (ref >90)
GFR calc non Af Amer: 41 mL/min — ABNORMAL LOW (ref >90)
Glucose, Bld: 114 mg/dL — ABNORMAL HIGH (ref 70–99)
POTASSIUM: 4.4 meq/L (ref 3.7–5.3)
Sodium: 141 mEq/L (ref 137–147)

## 2013-10-23 LAB — POCT INR: INR: 3.1

## 2013-10-23 SURGERY — CARDIOVERSION
Anesthesia: Monitor Anesthesia Care

## 2013-10-23 MED ORDER — SODIUM CHLORIDE 0.9 % IV SOLN
INTRAVENOUS | Status: DC | PRN
  Administered 2013-10-23: 12:00:00 via INTRAVENOUS

## 2013-10-23 MED ORDER — LIDOCAINE HCL (CARDIAC) 20 MG/ML IV SOLN
INTRAVENOUS | Status: DC | PRN
  Administered 2013-10-23: 30 mg via INTRAVENOUS

## 2013-10-23 MED ORDER — PROPOFOL 10 MG/ML IV BOLUS
INTRAVENOUS | Status: DC | PRN
  Administered 2013-10-23: 40 mg via INTRAVENOUS

## 2013-10-23 MED ORDER — SODIUM CHLORIDE 0.9 % IJ SOLN: 3.0000 mL | Freq: Two times a day (BID) | INTRAMUSCULAR | Status: DC

## 2013-10-23 MED ORDER — SODIUM CHLORIDE 0.9 % IV SOLN: 250.0000 mL | INTRAVENOUS | Status: DC

## 2013-10-23 MED ORDER — SODIUM CHLORIDE 0.9 % IJ SOLN
3.0000 mL | INTRAMUSCULAR | Status: DC | PRN
Start: 2013-10-23 — End: 2013-10-23

## 2013-10-23 NOTE — Transfer of Care (Signed)
Immediate Anesthesia Transfer of Care Note  Patient: [name]  Procedure(s) Performed: Procedure(s): CARDIOVERSION (N/A)  Patient Location: PACU and Endoscopy Unit  Anesthesia Type:MAC  Level of Consciousness: awake, alert  and oriented  Airway & Oxygen Therapy: Patient Spontanous Breathing  Post-op Assessment: Report given to PACU RN  Post vital signs: Reviewed and stable  Complications: No apparent anesthesia complications

## 2013-10-23 NOTE — Anesthesia Postprocedure Evaluation (Signed)
  Anesthesia Post-op Note  Patient: [name]  Procedure(s) Performed: Procedure(s): CARDIOVERSION (N/A)  Patient Location: PACU and Endoscopy Unit  Anesthesia Type:MAC  Level of Consciousness: awake, alert  and oriented  Airway and Oxygen Therapy: Patient Spontanous Breathing  Post-op Pain: none  Post-op Assessment: Post-op Vital signs reviewed  Post-op Vital Signs: Reviewed and stable  Complications: No apparent anesthesia complications

## 2013-10-23 NOTE — Interval H&P Note (Signed)
History and Physical Interval Note:  10/23/2013 11:51 AM  [name]  has presented today for surgery, with the diagnosis of AFIB  The various methods of treatment have been discussed with the patient and family. After consideration of risks, benefits and other options for treatment, the patient has consented to  Procedure(s): CARDIOVERSION (N/A) as a surgical intervention .  The patient's history has been reviewed, patient examined, no change in status, stable for surgery.  I have reviewed the patient's chart and labs.  Questions were answered to the patient's satisfaction.     Jareth Pardee [employer]

## 2013-10-23 NOTE — Procedures (Signed)
Electrical Cardioversion Procedure Note Derek Laughter [id number] [birth year]  Procedure: Electrical Cardioversion Indications:  Atrial Flutter (atypical atrial flutter).  INR has been therapeutic over the last few months and is 3.1 today.   Procedure Details Consent: Risks of procedure as well as the alternatives and risks of each were explained to the (patient/caregiver).  Consent for procedure obtained. Time Out: Verified patient identification, verified procedure, site/side was marked, verified correct patient position, special equipment/implants available, medications/allergies/relevent history reviewed, required imaging and test results available.  Performed  Patient placed on cardiac monitor, pulse oximetry, supplemental oxygen as necessary.  Sedation given: Propofol per anesthesiology Pacer pads placed anterior and posterior chest.  Cardioverted 1 time(s).  Cardioverted at 150J.  Evaluation Findings: Post procedure EKG shows: NSR Complications: None Patient did tolerate procedure well.   Loralie Champagne 10/23/2013, 12:03 PM

## 2013-10-23 NOTE — Discharge Instructions (Signed)
Electrical Cardioversion Electrical cardioversion is the delivery of a jolt of electricity to change the rhythm of the heart. Sticky patches or metal paddles are placed on the chest to deliver the electricity from a device. This is done to restore a normal rhythm. A rhythm that is too fast or not regular keeps the heart from pumping well. Electrical cardioversion is done in an emergency if:   There is low or no blood pressure as a result of the heart rhythm.   Normal rhythm must be restored as fast as possible to protect the brain and heart from further damage.   It may save a life. Cardioversion may be done for heart rhythms that are not immediately life-threatening, such as atrial fibrillation or flutter, in which:   The heart is beating too fast or is not regular.   Medicine to change the rhythm has not worked.   It is safe to wait in order to allow time for preparation.  Symptoms of the abnormal rhythm are bothersome.  The risk of stroke and other serious complications can be reduced. LET YOUR CAREGIVER KNOW ABOUT:   All medicines you are taking, including vitamins, herbs, eye drops, creams, and over-the-counter medicines.   Previous problems you or members of your family have had with the use of anesthetics.   Any blood disorders you have.   Previous surgeries you have had.   Medical conditions you have. RISKS AND COMPLICATIONS  Generally, this is a safe procedure. However, as with any procedure, complications can occur. Possible complications include:   Breathing problems related to the anesthetic used.  Cardiac arrest This risk is rare.  A blood clot that breaks free and travels to other parts of your body. This could cause a stroke or other problems. The risk of this is lowered by use of blood thinning medicine (anticoagulant) prior to the procedure. BEFORE THE PROCEDURE   You may have tests to detect blood clots in your heart and evaluate heart  function.  You may start taking anticoagulants so your blood does not clot as easily.   Medicines may be given to help stabilize your heart rate and rhythm. PROCEDURE  You will be given medicine through an IV tube to reduce discomfort and make you sleepy (sedative).   An electrical shock will be delivered. AFTER THE PROCEDURE Your heart rhythm will be watched to make sure it does not change.You may be able to go home within a few hours.  Document Released: 08/31/2002 Document Revised: 07/01/2013 Document Reviewed: 03/25/2013 [facility] Patient Information 2014 [email or web address]. Electrical Cardioversion, Care After Refer to this sheet in the next few weeks. These instructions provide you with information on caring for yourself after your procedure. Your health care provider may also give you more specific instructions. Your treatment has been planned according to current medical practices, but problems sometimes occur. Call your health care provider if you have any problems or questions after your procedure. WHAT TO EXPECT AFTER THE PROCEDURE After your procedure, it is typical to have the following sensations:  Some redness on the skin where the shocks were delivered. If this is tender, a sunburn lotion or hydrocortisone cream may help.  Possible return of an abnormal heart rhythm within hours or days after the procedure. HOME CARE INSTRUCTIONS  Only take medicine as directed by your health care provider. Be sure you understand how and when to take your medicine.  Learn how to feel your pulse and check it often.  Limit your activity  for 48 hours after the procedure or as directed.  Avoid or minimize caffeine and other stimulants as directed. SEEK MEDICAL CARE IF:  You feel like your heart is beating too fast or your pulse is not regular.  You have any questions about your medicines.  You have bleeding that will not stop. SEEK IMMEDIATE MEDICAL CARE IF:  You are dizzy or feel  faint.  It is hard to breathe or you feel short of breath.  There is a change in discomfort in your chest.  Your speech is slurred or you have trouble moving an arm or leg on one side of your body.  You get a serious muscle cramp that does not go away.  Your fingers or toes turn cold or blue. MAKE SURE YOU:   Understand these instructions.   Will watch your condition.   Will get help right away if you are not doing well or get worse. Document Released: 07/01/2013 Document Reviewed: 03/25/2013 [facility] Patient Information 2014 [place], [place]. Apply hydrocortisone cream to affected areas on chest and back TID

## 2013-10-23 NOTE — H&P (View-Only) (Signed)
Patient ID: [name], male   DOB: Nov 20, 1925, 80 y.o.   MRN: [id number] PCP: Dr. Joseph Art  80 yo with history of CAD, ischemic CMP, CKD, and paroxysmal atrial fibrillation/flutter presents for cardiology followup. He speaks little [place] and his daughter interprets.  No chest pain.  Patient has a history of myalgias with Crestor and Lipitor.  He has tolerated pravastatin without myalgias.  He walks about 1 mile most days without exertional dyspnea.  No chest pain.  He has not been having as much trouble with dizziness lately.  In 4/14, I cardioverted him out of atypical atrial flutter.  At last appointment, he was in NSR.  However, he is back in atypical atrial flutter with HR 79 today.   Labs (9/11): K 4.1, creatinine 1.4  Labs (8/12): K 3.8, creatinine 1.3, TSH normal, LFTs normal, LDL 96, HDL 50 Labs (1/13): K 3.9, creatinine 1.3, TSH normal, LFTs normal Labs (2/13): K 3.8, creatinine 1.3 Labs (5/13): K 3.5, creatinine 1.4, BNP 331, LFTs normal, TSH normal Labs (9/13): LDL 85, HDL 53, BNP 547, LFTs normal Labs (12/13): K 4.3, creatinine 1.49 => 1.4, digoxin 1.4, TSH normal, LFTs normal Labs (1/14): digoxin 1.0, K 4.3, creatinine 1.5 Labs (3/14): free T4 normal Labs (4/14): K 5=> 3.8, creatinine 1.8 => 1.4, digoxin 0.6 Labs (5/14): TSH normal, digoxin level normal Labs (8/14): K 4.2, creatinine 1.66, AST 42, ALT 37 Labs (9/14): K 4, creatinine 1.6, digoxin 1.3, LDL 104, HDL 51, AST 41, ALT 30 Labs (11/14): K 3.6, creatinine 1.5, BNP 126 Labs (12/14): TSH 6.93  ECG: atypical atrial flutter with 2:1 block HR 79, inferior small Qs, inferior TWIs, iRBBB, right axis deviation.   Allergies (verified):  1) ! Crestor  2) Vytorin  3) Atenolol   Past Medical History:  1. Coronary artery disease: LHC 4/06 with 60% pLAD, 40% ramus, 60% mRCA, and 90% mPDA. No intervention. Inferior akinesis on LV-gram was suggestive of possible inferior MI with recanalization.  2. Hyperlipidemia: Myalgias  with Lipitor and Crestor.  3. Hypothyroidism  4. Peripheral vascular disease  5. Anxiety  6. Skin cancer, hx of 2008 BCC  7. Colonic polyps, hx of  8. Paroxysmal atrial fibrillation with rapid ventricular response treated with amiodarone therapy converting to normal sinus rhythm 02/2009.  He has also had atypical atrial flutter noted in 4/14, cardioverted to NSR in 4/14. In atypical flutter again in 0000000.  9. Systolic CHF: Probable ischemic cardiomyopathy. Echo (6/10) with EF 40%, mildly dilated LV, moderate MR.  Echo (3/12): EF 30-35%, inferolateral severe hypokinesis, trivial AI, moderate to severe MR.  10. History of PNA  11. CKD. Baseline creatinine around 1.4.  12. Mitral regurgitation: Moderate by echo 6/10. 2-3+ by cath 2006.  Moderate-severe by echo in 3/12.  13. PVCs  14. BPH 15. Recurrent UTIs 16. Imbalance  Family History:  Family History of CAD Male 1st degree relative <60   Social History:  Retired  Originally from [place], speaks little [place]  Married  Former Smoker   ROS: All systems reviewed and negative except as per HPI.   Current Outpatient Prescriptions  Medication Sig Dispense Refill  . ALPRAZolam (XANAX) 0.25 MG tablet 1 as needed every 12 hours as needed. Do not take if you use Ambien  10 tablet  1  . amiodarone (PACERONE) 200 MG tablet Take 1 tablet (200 mg total) by mouth daily.      Marland Kitchen co-enzyme Q-10 30 MG capsule Take 3 capsules (90 mg total) by mouth  daily.  90 capsule  3  . digoxin (LANOXIN) 0.125 MG tablet 0.125 mg every other day. 1 every other day      . digoxin (LANOXIN) 0.125 MG tablet TAKE 1 TABLET (0.125 MG TOTAL) BY MOUTH EVERY OTHER DAY.  15 tablet  0  . furosemide (LASIX) 40 MG tablet 1 tablet (total 40mg ) in the AM  90 tablet  3  . levothyroxine (SYNTHROID) 125 MCG tablet Take 1 tablet (125 mcg total) by mouth daily before breakfast.  90 tablet  1  . lisinopril (PRINIVIL,ZESTRIL) 5 MG tablet Take 1 tablet (5 mg total) by mouth daily.  90  tablet  3  . metoprolol succinate (TOPROL-XL) 25 MG 24 hr tablet TAKE 1/2 TABLET (TOTAL 12.5MG ) TWO TIMES A DAY  90 tablet  0  . Multiple Vitamin (MULTIVITAMIN) tablet Take 1 tablet by mouth daily.      . Multiple Vitamins-Minerals (CENTRUM SILVER ADULT 50+) TABS Take 1 tablet by mouth daily.  90 tablet  3  . Omega-3 Fatty Acids (FISH OIL) 1200 MG CAPS Take 1 capsule (1,200 mg total) by mouth daily.  90 capsule  3  . potassium chloride (K-DUR) 10 MEQ tablet Take 2 tablets (20 mEq total) by mouth daily.  180 tablet  0  . pravastatin (PRAVACHOL) 80 MG tablet TAKE 1 TABLET (80 MG TOTAL) BY MOUTH DAILY.  90 tablet  0  . sulfamethoxazole-trimethoprim (BACTRIM DS) 800-160 MG per tablet Take 1 tablet by mouth 2 (two) times daily.  14 tablet  0  . Tamsulosin HCl (FLOMAX) 0.4 MG CAPS Take 1 capsule (0.4 mg total) by mouth daily.  90 capsule  3  . warfarin (COUMADIN) 1 MG tablet Take as directed by anticoagulation clinic  60 tablet  3  . warfarin (COUMADIN) 1 MG tablet TAKE AS DIRECTED BY ANTICOAGULATION CLINIC  60 tablet  3  . zolpidem (AMBIEN) 10 MG tablet TAKE 1 TABLET BY MOUTH AT BEDTIME AS NEEDED  30 tablet  5   No current facility-administered medications for this visit.    BP 118/56  Pulse 79  Ht 5\' 4"  (1.626 m)  Wt 62.143 kg (137 lb)  BMI 23.50 kg/m2 General: NAD Neck: No JVD; no thyromegaly or thyroid nodule.  Lungs: CTAB  CV: Nondisplaced PMI.  Heart regular S1/S2, no S3/S4, 1/6 HSM at apex.  No edema.  No carotid bruit. Abdomen: Soft, nontender, no hepatosplenomegaly, no distention.  Neurologic: Alert and oriented x 3.  Psych: Normal affect. Extremities: No clubbing or cyanosis.   Assessment/Plan: 1. ATRIAL FLUTTER  Patient is back in atypical atrial flutter today.  Rate is controlled and he really is minimally symptomatic. - Amiodarone has been increased to 200 mg daily.  Will see if he will go back into NSR.  Will check LFTs and TSH in 2/15.  He will need yearly eye exams with  amiodarone.  - I do not think that it is urgent or necessary to cardiovert him at this time given lack of significant symptoms or tachycardia.  He is anticoagulated.  I will see him back in 2 wks to see if his symptoms have worsened at all.  As long as INR remains therapeutic, he can have DCCV in the future without TEE. 2. HYPERLIPIDEMIA  He is tolerating pravastatin without myalgias. Had myalgias with Lipitor and Crestor.  LDL not ideal, but will continue current pravastatin as he tolerates it.   3. MITRAL INSUFFICIENCY Last echo showed moderate to severe MR. Given age and  medical condition, would plan medical management.  4. SYSTOLIC HEART FAILURE, CHRONIC  EF 30-35% on last echo. Breathing is stable, NYHA class II symptoms.  He does not appear volume overloaded on exam.  - Continue digoxin every other day.  Will recheck level today with next labs. - Continue current doses of Toprol XL and lisinopril.  - Continue current dose of Lasix.  - Would avoid ICD at his age.  5. Imbalance Possibly related to peripheral neuropathy.    Followup in 3 months  Loralie Champagne 10/19/2013

## 2013-10-23 NOTE — Anesthesia Preprocedure Evaluation (Addendum)
Anesthesia Evaluation  Patient identified by MRN, date of birth, ID band Patient awake    Reviewed: Allergy & Precautions, H&P , NPO status , Patient's Chart, lab work & pertinent test results  Airway Mallampati: II TM Distance: >3 FB Neck ROM: Full    Dental  (+) Edentulous Upper and Edentulous Lower   Pulmonary          Cardiovascular hypertension, + CAD, + Past MI, + Peripheral Vascular Disease and +CHF + dysrhythmias Atrial Fibrillation Rhythm:Irregular     Neuro/Psych Anxiety Depression    GI/Hepatic   Endo/Other  Hypothyroidism   Renal/GU Renal InsufficiencyRenal disease     Musculoskeletal   Abdominal   Peds  Hematology   Anesthesia Other Findings   Reproductive/Obstetrics                          Anesthesia Physical Anesthesia Plan  ASA: II  Anesthesia Plan: MAC   Post-op Pain Management:    Induction: Intravenous  Airway Management Planned:   Additional Equipment:   Intra-op Plan:   Post-operative Plan:   Informed Consent: I have reviewed the patients History and Physical, chart, labs and discussed the procedure including the risks, benefits and alternatives for the proposed anesthesia with the patient or authorized representative who has indicated his/her understanding and acceptance.     Plan Discussed with: CRNA, Anesthesiologist and Surgeon  Anesthesia Plan Comments:        Anesthesia Quick Evaluation

## 2013-10-26 ENCOUNTER — Encounter (HOSPITAL_COMMUNITY): Payer: Self-pay

## 2013-10-26 ENCOUNTER — Ambulatory Visit (HOSPITAL_COMMUNITY): Admit: 2013-10-26 | Payer: Self-pay | Admitting: Cardiology

## 2013-10-26 ENCOUNTER — Encounter (HOSPITAL_COMMUNITY): Payer: Self-pay | Admitting: Cardiology

## 2013-10-26 SURGERY — CARDIOVERSION
Anesthesia: Monitor Anesthesia Care

## 2013-10-29 ENCOUNTER — Ambulatory Visit: Payer: Medicare Other | Admitting: Cardiology

## 2013-10-30 ENCOUNTER — Other Ambulatory Visit (INDEPENDENT_AMBULATORY_CARE_PROVIDER_SITE_OTHER): Payer: Medicare Other

## 2013-10-30 DIAGNOSIS — I5022 Chronic systolic (congestive) heart failure: Secondary | ICD-10-CM

## 2013-10-30 DIAGNOSIS — I4891 Unspecified atrial fibrillation: Secondary | ICD-10-CM

## 2013-10-30 DIAGNOSIS — I4892 Unspecified atrial flutter: Secondary | ICD-10-CM

## 2013-10-30 DIAGNOSIS — I2510 Atherosclerotic heart disease of native coronary artery without angina pectoris: Secondary | ICD-10-CM

## 2013-10-30 LAB — BASIC METABOLIC PANEL
BUN: 20 mg/dL (ref 6–23)
CO2: 28 meq/L (ref 19–32)
Calcium: 8.7 mg/dL (ref 8.4–10.5)
Chloride: 105 mEq/L (ref 96–112)
Creatinine, Ser: 1.4 mg/dL (ref 0.4–1.5)
GFR: 51.95 mL/min — AB (ref >60.00)
Glucose, Bld: 101 mg/dL — ABNORMAL HIGH (ref 70–99)
POTASSIUM: 3.7 meq/L (ref 3.5–5.1)
SODIUM: 139 meq/L (ref 135–145)

## 2013-10-30 LAB — CBC WITH DIFFERENTIAL/PLATELET
BASOS ABS: 0 10*3/uL (ref 0.0–0.1)
Basophils Relative: 0.2 % (ref 0.0–3.0)
EOS ABS: 0 10*3/uL (ref 0.0–0.7)
Eosinophils Relative: 0.2 % (ref 0.0–5.0)
HCT: 36 % — ABNORMAL LOW (ref 39.0–52.0)
HEMOGLOBIN: 12.1 g/dL — AB (ref 13.0–17.0)
LYMPHS PCT: 26.5 % (ref 12.0–46.0)
Lymphs Abs: 1.9 10*3/uL (ref 0.7–4.0)
MCHC: 33.5 g/dL (ref 30.0–36.0)
MCV: 93.6 fl (ref 78.0–100.0)
MONO ABS: 0.9 10*3/uL (ref 0.1–1.0)
Monocytes Relative: 12.3 % — ABNORMAL HIGH (ref 3.0–12.0)
NEUTROS ABS: 4.4 10*3/uL (ref 1.4–7.7)
Neutrophils Relative %: 60.8 % (ref 43.0–77.0)
Platelets: 147 10*3/uL — ABNORMAL LOW (ref 150.0–400.0)
RBC: 3.85 Mil/uL — ABNORMAL LOW (ref 4.22–5.81)
RDW: 15 % — ABNORMAL HIGH (ref 11.5–14.6)
WBC: 7.2 10*3/uL (ref 4.5–10.5)

## 2013-10-30 LAB — HEPATIC FUNCTION PANEL
ALT: 56 U/L — ABNORMAL HIGH (ref 0–53)
AST: 59 U/L — ABNORMAL HIGH (ref 0–37)
Albumin: 3.5 g/dL (ref 3.5–5.2)
Alkaline Phosphatase: 48 U/L (ref 39–117)
Bilirubin, Direct: 0 mg/dL (ref 0.0–0.3)
TOTAL PROTEIN: 6.9 g/dL (ref 6.0–8.3)
Total Bilirubin: 0.9 mg/dL (ref 0.3–1.2)

## 2013-10-30 LAB — TSH: TSH: 0.84 u[IU]/mL (ref 0.35–5.50)

## 2013-11-03 ENCOUNTER — Ambulatory Visit (INDEPENDENT_AMBULATORY_CARE_PROVIDER_SITE_OTHER): Payer: Medicare Other | Admitting: Cardiology

## 2013-11-03 ENCOUNTER — Ambulatory Visit (INDEPENDENT_AMBULATORY_CARE_PROVIDER_SITE_OTHER): Payer: Medicare Other

## 2013-11-03 ENCOUNTER — Encounter: Payer: Self-pay | Admitting: *Deleted

## 2013-11-03 ENCOUNTER — Encounter: Payer: Self-pay | Admitting: Cardiology

## 2013-11-03 VITALS — BP 112/58 | HR 59 | Ht 64.0 in | Wt 140.0 lb

## 2013-11-03 DIAGNOSIS — I4891 Unspecified atrial fibrillation: Secondary | ICD-10-CM

## 2013-11-03 DIAGNOSIS — E785 Hyperlipidemia, unspecified: Secondary | ICD-10-CM

## 2013-11-03 DIAGNOSIS — I5022 Chronic systolic (congestive) heart failure: Secondary | ICD-10-CM

## 2013-11-03 DIAGNOSIS — R0989 Other specified symptoms and signs involving the circulatory and respiratory systems: Secondary | ICD-10-CM

## 2013-11-03 DIAGNOSIS — I080 Rheumatic disorders of both mitral and aortic valves: Secondary | ICD-10-CM

## 2013-11-03 DIAGNOSIS — Z5181 Encounter for therapeutic drug level monitoring: Secondary | ICD-10-CM

## 2013-11-03 LAB — POCT INR: INR: 2.9

## 2013-11-03 NOTE — Progress Notes (Signed)
Patient ID: [name], male   DOB: [birth year], 80 y.o.   MRN: [id number] PCP: Dr. Joseph Art  80 yo with history of CAD, ischemic CMP, CKD, and paroxysmal atrial fibrillation/flutter presents for cardiology followup. He speaks little [place] and his daughter interprets.  No chest pain.  Patient has a history of myalgias with Crestor and Lipitor.  He has tolerated pravastatin without myalgias.  At last appointment, he was noted to be back in atypical atrial flutter.  It was rate-controlled and initially I thought to treat him with a rate control/anticoagulation strategy.  However, he started to notice increased exertional dyspnea.  Therefore, I took him for cardioversion back to NSR, in 1/15.  He is in NSR today.  I had him increase his amiodarone back to 200 mg daily.   Currently, he walks about 1 mile most days without exertional dyspnea.  No chest pain.  No dizziness/lightheadedness.  Main problem now is anxiety.   Labs (9/11): K 4.1, creatinine 1.4  Labs (8/12): K 3.8, creatinine 1.3, TSH normal, LFTs normal, LDL 96, HDL 50 Labs (1/13): K 3.9, creatinine 1.3, TSH normal, LFTs normal Labs (2/13): K 3.8, creatinine 1.3 Labs (5/13): K 3.5, creatinine 1.4, BNP 331, LFTs normal, TSH normal Labs (9/13): LDL 85, HDL 53, BNP 547, LFTs normal Labs (12/13): K 4.3, creatinine 1.49 => 1.4, digoxin 1.4, TSH normal, LFTs normal Labs (1/14): digoxin 1.0, K 4.3, creatinine 1.5 Labs (3/14): free T4 normal Labs (4/14): K 5=> 3.8, creatinine 1.8 => 1.4, digoxin 0.6 Labs (5/14): TSH normal, digoxin level normal Labs (8/14): K 4.2, creatinine 1.66, AST 42, ALT 37 Labs (9/14): K 4, creatinine 1.6, digoxin 1.3, LDL 104, HDL 51, AST 41, ALT 30 Labs (11/14): K 3.6, creatinine 1.5, BNP 126 Labs (12/14): TSH 6.93 Labs (2/15): K 3.7, creatinine 1.4, TSH normal, AST 59, ALT 56  ECG: NSR at 59   Allergies (verified):  1) ! Crestor  2) Vytorin  3) Atenolol   Past Medical History:  1. Coronary artery disease:  LHC 4/06 with 60% pLAD, 40% ramus, 60% mRCA, and 90% mPDA. No intervention. Inferior akinesis on LV-gram was suggestive of possible inferior MI with recanalization.  2. Hyperlipidemia: Myalgias with Lipitor and Crestor.  3. Hypothyroidism  4. Peripheral vascular disease  5. Anxiety  6. Skin cancer, hx of 2008 BCC  7. Colonic polyps, hx of  8. Paroxysmal atrial fibrillation with rapid ventricular response treated with amiodarone therapy converting to normal sinus rhythm 02/2009.  He has also had atypical atrial flutter noted in 4/14, cardioverted to NSR in 4/14. In atypical flutter again in 1/15, DCCV in 1/15 to NSR.  9. Systolic CHF: Probable ischemic cardiomyopathy. Echo (6/10) with EF 40%, mildly dilated LV, moderate MR.  Echo (3/12): EF 30-35%, inferolateral severe hypokinesis, trivial AI, moderate to severe MR.  10. History of PNA  11. CKD. Baseline creatinine around 1.4.  12. Mitral regurgitation: Moderate by echo 6/10. 2-3+ by cath 2006.  Moderate-severe by echo in 3/12.  13. PVCs  14. BPH 15. Recurrent UTIs 16. Imbalance  Family History:  Family History of CAD Male 1st degree relative <60   Social History:  Retired  Originally from [place], speaks little [place]  Married  Former Smoker   ROS: All systems reviewed and negative except as per HPI.   Current Outpatient Prescriptions  Medication Sig Dispense Refill  . ALPRAZolam (XANAX) 0.25 MG tablet 1 as needed every 12 hours as needed. Do not take if you use Ambien  10 tablet  1  . amiodarone (PACERONE) 200 MG tablet Take 1 tablet (200 mg total) by mouth daily.      Marland Kitchen co-enzyme Q-10 30 MG capsule Take 3 capsules (90 mg total) by mouth daily.  90 capsule  3  . digoxin (LANOXIN) 0.125 MG tablet 0.125 mg every other day. 1 every other day      . digoxin (LANOXIN) 0.125 MG tablet TAKE 1 TABLET (0.125 MG TOTAL) BY MOUTH EVERY OTHER DAY.  15 tablet  0  . furosemide (LASIX) 40 MG tablet 1 tablet (total 40mg ) in the AM  90 tablet  3   . levothyroxine (SYNTHROID) 125 MCG tablet Take 1 tablet (125 mcg total) by mouth daily before breakfast.  90 tablet  1  . lisinopril (PRINIVIL,ZESTRIL) 5 MG tablet Take 1 tablet (5 mg total) by mouth daily.  90 tablet  3  . metoprolol succinate (TOPROL-XL) 25 MG 24 hr tablet TAKE 1/2 TABLET (TOTAL 12.5MG ) TWO TIMES A DAY  90 tablet  0  . Multiple Vitamin (MULTIVITAMIN) tablet Take 1 tablet by mouth daily.      . Multiple Vitamins-Minerals (CENTRUM SILVER ADULT 50+) TABS Take 1 tablet by mouth daily.  90 tablet  3  . Omega-3 Fatty Acids (FISH OIL) 1200 MG CAPS Take 1 capsule (1,200 mg total) by mouth daily.  90 capsule  3  . potassium chloride (K-DUR) 10 MEQ tablet Take 2 tablets (20 mEq total) by mouth daily.  180 tablet  0  . pravastatin (PRAVACHOL) 80 MG tablet TAKE 1 TABLET (80 MG TOTAL) BY MOUTH DAILY.  90 tablet  0  . sulfamethoxazole-trimethoprim (BACTRIM DS) 800-160 MG per tablet Take 1 tablet by mouth 2 (two) times daily.  14 tablet  0  . tamsulosin (FLOMAX) 0.4 MG CAPS capsule Take 1 capsule (0.4 mg total) by mouth daily. PATIENT NEEDS OFFICE VISIT FOR ADDITIONAL REFILLS  30 capsule  0  . warfarin (COUMADIN) 1 MG tablet Take as directed by anticoagulation clinic  60 tablet  3  . warfarin (COUMADIN) 1 MG tablet TAKE AS DIRECTED BY ANTICOAGULATION CLINIC  60 tablet  3  . zolpidem (AMBIEN) 10 MG tablet TAKE 1 TABLET BY MOUTH AT BEDTIME AS NEEDED  30 tablet  5   No current facility-administered medications for this visit.    BP 112/58  Pulse 59  Ht 5\' 4"  (1.626 m)  Wt 63.504 kg (140 lb)  BMI 24.02 kg/m2 General: NAD Neck: No JVD; no thyromegaly or thyroid nodule.  Lungs: CTAB  CV: Nondisplaced PMI.  Heart regular S1/S2, no S3/S4, 1/6 HSM at apex.  No edema.  Right carotid bruit. Abdomen: Soft, nontender, no hepatosplenomegaly, no distention.  Neurologic: Alert and oriented x 3.  Psych: Normal affect. Extremities: No clubbing or cyanosis.   Assessment/Plan: 1. ATRIAL FLUTTER   Patient is in NSR today after cardioversion. He will continue warfarin and amiodarone at higher dose (200 mg daily).  He has had mildly elevated LFTs which I will need to follow (repeat in 1 month). 2. HYPERLIPIDEMIA  He is tolerating pravastatin without myalgias. Had myalgias with Lipitor and Crestor.  LDL not ideal, but will continue current pravastatin as he tolerates it.  Repeat lipids in 1 month.  3. MITRAL INSUFFICIENCY Last echo showed moderate to severe MR. Given age and medical condition, would plan medical management.  4. SYSTOLIC HEART FAILURE, CHRONIC  EF 30-35% on last echo. Breathing is stable, NYHA class II symptoms.  He does not appear  volume overloaded on exam.  - Continue digoxin every other day.  Check level in 1 month.  - Continue current doses of Toprol XL and lisinopril.  - Continue current dose of Lasix.  - Would avoid ICD at his age.  5. Carotid bruit On the right. Will get carotid dopplers.  6. Anxiety Generalized anxiety, quite limiting.  He may benefit from an SSRI.  I asked him to followup with his PCP.    Followup in 2 months  Loralie Champagne 11/03/2013

## 2013-11-03 NOTE — Patient Instructions (Signed)
Your physician has requested that you have a carotid duplex. This test is an ultrasound of the carotid arteries in your neck. It looks at blood flow through these arteries that supply the brain with blood. Allow one hour for this exam. There are no restrictions or special instructions.  Your physician recommends that you return for lab work in: 1 month--Digoxin level /liver profile.  Your physician recommends that you schedule a follow-up appointment in: 2 months with Dr Aundra Dubin.

## 2013-11-05 ENCOUNTER — Ambulatory Visit (HOSPITAL_COMMUNITY): Payer: Medicare Other | Attending: Cardiology

## 2013-11-05 ENCOUNTER — Telehealth: Payer: Self-pay | Admitting: Cardiology

## 2013-11-05 DIAGNOSIS — I4891 Unspecified atrial fibrillation: Secondary | ICD-10-CM

## 2013-11-05 DIAGNOSIS — I6529 Occlusion and stenosis of unspecified carotid artery: Secondary | ICD-10-CM

## 2013-11-05 DIAGNOSIS — I2510 Atherosclerotic heart disease of native coronary artery without angina pectoris: Secondary | ICD-10-CM | POA: Insufficient documentation

## 2013-11-05 DIAGNOSIS — I739 Peripheral vascular disease, unspecified: Secondary | ICD-10-CM | POA: Insufficient documentation

## 2013-11-05 DIAGNOSIS — R0989 Other specified symptoms and signs involving the circulatory and respiratory systems: Secondary | ICD-10-CM

## 2013-11-05 DIAGNOSIS — I658 Occlusion and stenosis of other precerebral arteries: Secondary | ICD-10-CM | POA: Insufficient documentation

## 2013-11-05 DIAGNOSIS — E785 Hyperlipidemia, unspecified: Secondary | ICD-10-CM | POA: Insufficient documentation

## 2013-11-05 DIAGNOSIS — I10 Essential (primary) hypertension: Secondary | ICD-10-CM | POA: Insufficient documentation

## 2013-11-05 NOTE — Telephone Encounter (Signed)
See note attached to study

## 2013-11-05 NOTE — Telephone Encounter (Signed)
Daughter calls for results of carotid ultrasound performed this morning in our office. States as Dr. Aundra Dubin knows pt is very anxious about results   I will forward to Dr. Aundra Dubin for review as pt is anxious to know the results Horton Chin RN

## 2013-11-05 NOTE — Telephone Encounter (Signed)
New message   Patient daughter calling for test results. Patient had carotid done today.

## 2013-11-06 ENCOUNTER — Encounter: Payer: Self-pay | Admitting: Cardiology

## 2013-11-06 NOTE — Telephone Encounter (Signed)
Daughter is aware of the carotid results Horton Chin RN

## 2013-11-09 ENCOUNTER — Encounter (HOSPITAL_COMMUNITY): Payer: Medicare Other

## 2013-11-11 ENCOUNTER — Ambulatory Visit (INDEPENDENT_AMBULATORY_CARE_PROVIDER_SITE_OTHER): Payer: Medicare Other

## 2013-11-11 DIAGNOSIS — Z5181 Encounter for therapeutic drug level monitoring: Secondary | ICD-10-CM

## 2013-11-11 DIAGNOSIS — I4891 Unspecified atrial fibrillation: Secondary | ICD-10-CM

## 2013-11-11 LAB — POCT INR: INR: 2.7

## 2013-11-27 ENCOUNTER — Other Ambulatory Visit: Payer: Self-pay | Admitting: Cardiology

## 2013-12-01 ENCOUNTER — Ambulatory Visit (INDEPENDENT_AMBULATORY_CARE_PROVIDER_SITE_OTHER): Payer: Medicare Other | Admitting: *Deleted

## 2013-12-01 ENCOUNTER — Other Ambulatory Visit (INDEPENDENT_AMBULATORY_CARE_PROVIDER_SITE_OTHER): Payer: Medicare Other

## 2013-12-01 DIAGNOSIS — I4891 Unspecified atrial fibrillation: Secondary | ICD-10-CM

## 2013-12-01 DIAGNOSIS — Z5181 Encounter for therapeutic drug level monitoring: Secondary | ICD-10-CM

## 2013-12-01 DIAGNOSIS — R0989 Other specified symptoms and signs involving the circulatory and respiratory systems: Secondary | ICD-10-CM

## 2013-12-01 DIAGNOSIS — E039 Hypothyroidism, unspecified: Secondary | ICD-10-CM

## 2013-12-01 LAB — HEPATIC FUNCTION PANEL
ALT: 50 U/L (ref 0–53)
AST: 57 U/L — ABNORMAL HIGH (ref 0–37)
Albumin: 3.6 g/dL (ref 3.5–5.2)
Alkaline Phosphatase: 49 U/L (ref 39–117)
BILIRUBIN DIRECT: 0.1 mg/dL (ref 0.0–0.3)
TOTAL PROTEIN: 7.2 g/dL (ref 6.0–8.3)
Total Bilirubin: 0.7 mg/dL (ref 0.3–1.2)

## 2013-12-01 LAB — POCT INR: INR: 2.5

## 2013-12-01 LAB — TSH: TSH: 0.34 u[IU]/mL — AB (ref 0.35–5.50)

## 2013-12-02 ENCOUNTER — Telehealth: Payer: Self-pay | Admitting: Cardiology

## 2013-12-02 LAB — DIGOXIN LEVEL: Digoxin Level: 1.2 ng/mL (ref 0.8–2.0)

## 2013-12-02 NOTE — Telephone Encounter (Signed)
Pt's daughter called for lab results labs done yesterday 12/01/13. Results given, Daughter is aware that dr. Aundra Dubin has not review the results. We will call pt when he does with recommendations.

## 2013-12-02 NOTE — Telephone Encounter (Signed)
New message         Pt daughter calling for pt test results from yesterday

## 2013-12-03 ENCOUNTER — Telehealth: Payer: Self-pay | Admitting: *Deleted

## 2013-12-03 ENCOUNTER — Encounter: Payer: Self-pay | Admitting: Cardiology

## 2013-12-03 NOTE — Telephone Encounter (Signed)
I spoke with daughter about digoxin results. I told her with the trending upward of normal with the digoxin level & her father's age the digoxin dose was decreased to reduce the possibility of toxicity. [name] understands. Horton Chin RN

## 2013-12-03 NOTE — Telephone Encounter (Signed)
Advised daughter of labs and med change. Faxed to PCP

## 2013-12-03 NOTE — Telephone Encounter (Signed)
Message copied by Earvin Hansen on Thu Dec 03, 2013  8:25 AM ------      Message from: Larey Dresser      Created: Thu Dec 03, 2013 12:25 AM       Decrease digoxin to 1/2 tablet every other day. ------

## 2013-12-06 ENCOUNTER — Other Ambulatory Visit: Payer: Self-pay | Admitting: Family Medicine

## 2013-12-07 NOTE — Telephone Encounter (Signed)
Dr Aundra Dubin reviewed, daughter given results 12/03/13.

## 2013-12-09 ENCOUNTER — Other Ambulatory Visit: Payer: Self-pay | Admitting: Cardiology

## 2013-12-13 ENCOUNTER — Telehealth: Payer: Self-pay

## 2013-12-13 ENCOUNTER — Other Ambulatory Visit: Payer: Self-pay | Admitting: Family Medicine

## 2013-12-13 NOTE — Telephone Encounter (Signed)
States that her father is completely out of Flomax. She knows that he needs an OV for further refills. Can he have enough to last him until he is able to come in.  Please call daughter Ronny Bacon at [phone]  [place]

## 2013-12-14 ENCOUNTER — Telehealth: Payer: Self-pay | Admitting: *Deleted

## 2013-12-14 MED ORDER — TAMSULOSIN HCL 0.4 MG PO CAPS: 0.4000 mg | ORAL_CAPSULE | Freq: Every day | ORAL | Status: DC

## 2013-12-14 NOTE — Telephone Encounter (Signed)
Spoke to daughter- she made appt for May 7 approved a 30 day supply.

## 2013-12-14 NOTE — Telephone Encounter (Signed)
Pt would need to make an appt for a refill on this medication.  Transferred to make an appt.

## 2013-12-24 ENCOUNTER — Other Ambulatory Visit: Payer: Self-pay | Admitting: Family Medicine

## 2013-12-24 DIAGNOSIS — G4700 Insomnia, unspecified: Secondary | ICD-10-CM

## 2013-12-24 DIAGNOSIS — E039 Hypothyroidism, unspecified: Secondary | ICD-10-CM

## 2013-12-24 DIAGNOSIS — N400 Enlarged prostate without lower urinary tract symptoms: Secondary | ICD-10-CM

## 2013-12-24 MED ORDER — ZOLPIDEM TARTRATE 10 MG PO TABS: 10.0000 mg | ORAL_TABLET | Freq: Every evening | ORAL | Status: DC | PRN

## 2013-12-24 MED ORDER — LEVOTHYROXINE SODIUM 125 MCG PO TABS: 125.0000 ug | ORAL_TABLET | Freq: Every day | ORAL | Status: DC

## 2013-12-24 MED ORDER — TAMSULOSIN HCL 0.4 MG PO CAPS: 0.4000 mg | ORAL_CAPSULE | Freq: Every day | ORAL | Status: DC

## 2013-12-26 ENCOUNTER — Other Ambulatory Visit: Payer: Self-pay | Admitting: Cardiology

## 2013-12-29 ENCOUNTER — Ambulatory Visit (INDEPENDENT_AMBULATORY_CARE_PROVIDER_SITE_OTHER): Payer: Medicare Other | Admitting: *Deleted

## 2013-12-29 DIAGNOSIS — I4891 Unspecified atrial fibrillation: Secondary | ICD-10-CM

## 2013-12-29 DIAGNOSIS — Z5181 Encounter for therapeutic drug level monitoring: Secondary | ICD-10-CM

## 2013-12-29 LAB — POCT INR: INR: 3.3

## 2014-01-04 ENCOUNTER — Encounter: Payer: Self-pay | Admitting: Cardiology

## 2014-01-04 ENCOUNTER — Ambulatory Visit (INDEPENDENT_AMBULATORY_CARE_PROVIDER_SITE_OTHER): Payer: Medicare Other | Admitting: Cardiology

## 2014-01-04 ENCOUNTER — Telehealth: Payer: Self-pay | Admitting: Cardiology

## 2014-01-04 VITALS — BP 114/51 | HR 56 | Ht 64.0 in | Wt 138.8 lb

## 2014-01-04 DIAGNOSIS — I2510 Atherosclerotic heart disease of native coronary artery without angina pectoris: Secondary | ICD-10-CM

## 2014-01-04 DIAGNOSIS — I080 Rheumatic disorders of both mitral and aortic valves: Secondary | ICD-10-CM

## 2014-01-04 DIAGNOSIS — E538 Deficiency of other specified B group vitamins: Secondary | ICD-10-CM

## 2014-01-04 DIAGNOSIS — I5022 Chronic systolic (congestive) heart failure: Secondary | ICD-10-CM

## 2014-01-04 DIAGNOSIS — I4891 Unspecified atrial fibrillation: Secondary | ICD-10-CM

## 2014-01-04 LAB — LIPID PANEL
CHOL/HDL RATIO: 3
CHOLESTEROL: 176 mg/dL (ref 0–200)
HDL: 62.4 mg/dL (ref >39.00)
LDL Cholesterol: 92 mg/dL (ref 0–99)
TRIGLYCERIDES: 107 mg/dL (ref 0.0–149.0)
VLDL: 21.4 mg/dL (ref 0.0–40.0)

## 2014-01-04 LAB — HEPATIC FUNCTION PANEL
ALT: 46 U/L (ref 0–53)
AST: 60 U/L — ABNORMAL HIGH (ref 0–37)
Albumin: 3.5 g/dL (ref 3.5–5.2)
Alkaline Phosphatase: 44 U/L (ref 39–117)
BILIRUBIN DIRECT: 0.1 mg/dL (ref 0.0–0.3)
BILIRUBIN TOTAL: 0.7 mg/dL (ref 0.3–1.2)
Total Protein: 7 g/dL (ref 6.0–8.3)

## 2014-01-04 LAB — BASIC METABOLIC PANEL
BUN: 26 mg/dL — ABNORMAL HIGH (ref 6–23)
CHLORIDE: 104 meq/L (ref 96–112)
CO2: 24 meq/L (ref 19–32)
Calcium: 8.8 mg/dL (ref 8.4–10.5)
Creatinine, Ser: 1.4 mg/dL (ref 0.4–1.5)
GFR: 50.24 mL/min — ABNORMAL LOW (ref >60.00)
GLUCOSE: 103 mg/dL — AB (ref 70–99)
POTASSIUM: 4 meq/L (ref 3.5–5.1)
SODIUM: 138 meq/L (ref 135–145)

## 2014-01-04 LAB — TSH: TSH: 0.22 u[IU]/mL — ABNORMAL LOW (ref 0.35–5.50)

## 2014-01-04 NOTE — Telephone Encounter (Signed)
Daughter aware Dr Aundra Dubin has not reviewed labs yet, will call back once Dr Aundra Dubin has reviewed.

## 2014-01-04 NOTE — Patient Instructions (Signed)
Your physician recommends that you return for lab work today--BMET/TSH/Digoxin level/Lipid profile/B12 level.  Your physician wants you to follow-up in: 3 months with Dr Aundra Dubin. (July 2015).  You will receive a reminder letter in the mail two months in advance. If you don't receive a letter, please call our office to schedule the follow-up appointment.

## 2014-01-04 NOTE — Progress Notes (Signed)
Patient ID: [name], male   DOB: [birth year], 80 y.o.   MRN: [id number] PCP: Dr. Joseph Art  80 yo with history of CAD, ischemic CMP, CKD, and paroxysmal atrial fibrillation/flutter presents for cardiology followup. He speaks little [place] and his daughter interprets.  No chest pain.  Patient has a history of myalgias with Crestor and Lipitor.  He has tolerated pravastatin without myalgias.  At a prior appointment, he was noted to be back in atypical atrial flutter.  It was rate-controlled and initially I thought to treat him with a rate control/anticoagulation strategy.  However, he started to notice increased exertional dyspnea.  Therefore, I took him for cardioversion back to NSR, in 1/15.  He is in NSR today. He is on amiodarone.  Currently, he walks about 1 mile most days without exertional dyspnea.  No chest pain.  No dizziness/lightheadedness. His appetite is not great.  Greatest concern currently is numbness/tingling  in his fingers and toes.  Mildly high digoxin level, digoxin was decreased to 0.0625 mg every other day.   Labs (9/11): K 4.1, creatinine 1.4  Labs (8/12): K 3.8, creatinine 1.3, TSH normal, LFTs normal, LDL 96, HDL 50 Labs (1/13): K 3.9, creatinine 1.3, TSH normal, LFTs normal Labs (2/13): K 3.8, creatinine 1.3 Labs (5/13): K 3.5, creatinine 1.4, BNP 331, LFTs normal, TSH normal Labs (9/13): LDL 85, HDL 53, BNP 547, LFTs normal Labs (12/13): K 4.3, creatinine 1.49 => 1.4, digoxin 1.4, TSH normal, LFTs normal Labs (1/14): digoxin 1.0, K 4.3, creatinine 1.5 Labs (3/14): free T4 normal Labs (4/14): K 5=> 3.8, creatinine 1.8 => 1.4, digoxin 0.6 Labs (5/14): TSH normal, digoxin level normal Labs (8/14): K 4.2, creatinine 1.66, AST 42, ALT 37 Labs (9/14): K 4, creatinine 1.6, digoxin 1.3, LDL 104, HDL 51, AST 41, ALT 30 Labs (11/14): K 3.6, creatinine 1.5, BNP 126 Labs (12/14): TSH 6.93 Labs (2/15): K 3.7, creatinine 1.4, TSH normal, AST 59, ALT 56 Labs (3/15): TSH low,  AST 57, ALT 30, digoxin 1.2  ECG: NSR, 1st degree AV block, right axis deviation, mildly elevated QT interval.   Allergies (verified):  1) ! Crestor  2) Vytorin  3) Atenolol   Past Medical History:  1. Coronary artery disease: LHC 4/06 with 60% pLAD, 40% ramus, 60% mRCA, and 90% mPDA. No intervention. Inferior akinesis on LV-gram was suggestive of possible inferior MI with recanalization.  2. Hyperlipidemia: Myalgias with Lipitor and Crestor.  3. Hypothyroidism  4. Peripheral vascular disease  5. Anxiety  6. Skin cancer, hx of 2008 BCC  7. Colonic polyps, hx of  8. Paroxysmal atrial fibrillation with rapid ventricular response treated with amiodarone therapy converting to normal sinus rhythm 02/2009.  He has also had atypical atrial flutter noted in 4/14, cardioverted to NSR in 4/14. In atypical flutter again in 1/15, DCCV in 1/15 to NSR.  9. Systolic CHF: Probable ischemic cardiomyopathy. Echo (6/10) with EF 40%, mildly dilated LV, moderate MR.  Echo (3/12): EF 30-35%, inferolateral severe hypokinesis, trivial AI, moderate to severe MR.  10. History of PNA  11. CKD. Baseline creatinine around 1.4.  12. Mitral regurgitation: Moderate by echo 6/10. 2-3+ by cath 2006.  Moderate-severe by echo in 3/12.  13. PVCs  14. BPH 15. Recurrent UTIs 16. Imbalance 17. Carotid stenosis: Dopplers (2/15) with 40-59% BICA stenosis.   Family History:  Family History of CAD Male 1st degree relative <60   Social History:  Retired  Originally from [place], speaks little [place]  Married  Former  Smoker   ROS: All systems reviewed and negative except as per HPI.   Current Outpatient Prescriptions  Medication Sig Dispense Refill  . ALPRAZolam (XANAX) 0.25 MG tablet 1 as needed every 12 hours as needed. Do not take if you use Ambien  10 tablet  1  . amiodarone (PACERONE) 200 MG tablet Take 1 tablet (200 mg total) by mouth daily.      Marland Kitchen co-enzyme Q-10 30 MG capsule Take 3 capsules (90 mg total) by  mouth daily.  90 capsule  3  . digoxin (LANOXIN) 0.125 MG tablet 0.125 mg as directed. 1/2 tablet every other day      . furosemide (LASIX) 40 MG tablet 1 tablet (total 40mg ) in the AM  90 tablet  3  . KLOR-CON M10 10 MEQ tablet TAKE 2 TABLETS (20 MEQ TOTAL) BY MOUTH DAILY.  180 tablet  0  . levothyroxine (SYNTHROID) 125 MCG tablet Take 1 tablet (125 mcg total) by mouth daily before breakfast.  90 tablet  1  . lisinopril (PRINIVIL,ZESTRIL) 5 MG tablet Take 1 tablet (5 mg total) by mouth daily.  90 tablet  3  . metoprolol succinate (TOPROL-XL) 25 MG 24 hr tablet TAKE 1/2 TABLET (TOTAL 12.5MG ) TWO TIMES A DAY  90 tablet  0  . Multiple Vitamin (MULTIVITAMIN) tablet Take 1 tablet by mouth daily.      . Multiple Vitamins-Minerals (CENTRUM SILVER ADULT 50+) TABS Take 1 tablet by mouth daily.  90 tablet  3  . Omega-3 Fatty Acids (FISH OIL) 1200 MG CAPS Take 1 capsule (1,200 mg total) by mouth daily.  90 capsule  3  . pravastatin (PRAVACHOL) 80 MG tablet TAKE 1 TABLET (80 MG TOTAL) BY MOUTH DAILY.  90 tablet  0  . tamsulosin (FLOMAX) 0.4 MG CAPS capsule Take 1 capsule (0.4 mg total) by mouth daily. PATIENT NEEDS OFFICE VISIT FOR ADDITIONAL REFILLS  90 capsule  3  . warfarin (COUMADIN) 1 MG tablet 1 pill (1mg ) everyday except 2 pills (2mg ) on  Mondays, Thursdays and Saturdays or as directed by coumadin clinic  60 tablet  3  . zolpidem (AMBIEN) 10 MG tablet Take 1 tablet (10 mg total) by mouth at bedtime as needed for sleep.  30 tablet  5   No current facility-administered medications for this visit.    BP 114/51  Pulse 56  Ht 5\' 4"  (1.626 m)  Wt 62.959 kg (138 lb 12.8 oz)  BMI 23.81 kg/m2 General: NAD Neck: No JVD; no thyromegaly or thyroid nodule.  Lungs: CTAB  CV: Nondisplaced PMI.  Heart regular S1/S2, no S3/S4, 1/6 HSM at apex.  No edema.  Right carotid bruit. Abdomen: Soft, nontender, no hepatosplenomegaly, no distention.  Neurologic: Alert and oriented x 3.  Psych: Normal  affect. Extremities: No clubbing or cyanosis.   Assessment/Plan: 1. ATRIAL FLUTTER  Patient is in NSR today after cardioversion. He will continue warfarin and amiodarone at higher dose (200 mg daily).  He has had mildly elevated LFTs which I will need to continue to follow.  Check LFTs today.  I will also check TSH (he is already on thyroid replacement).  He knows to get a yearly eye exam . 2. HYPERLIPIDEMIA  He is tolerating pravastatin without myalgias. Had myalgias with Lipitor and Crestor.  LDL not ideal, but will continue current pravastatin as he tolerates it.  Repeat lipids today. 3. MITRAL INSUFFICIENCY Last echo showed moderate to severe MR. Given age and medical condition, would plan medical management.  4. SYSTOLIC HEART FAILURE, CHRONIC  EF 30-35% on last echo. Breathing is stable, NYHA class II symptoms.  He does not appear volume overloaded on exam.  - Continue current digoxin dose, check level today.  - Continue current doses of Toprol XL and lisinopril.  - Continue current dose of Lasix and check BMET.  - Would avoid ICD at his age.  5. Carotid stenosis: Moderate bilateral stenosis, repeat study in 2/16.  6. Peripheral neuropathy: The tingling/numbness in his hands and feet seem like a peripheral neuropathy.  I will check hemoglobin A1c and B12 level.   Larey Dresser 01/04/2014

## 2014-01-04 NOTE — Telephone Encounter (Signed)
New message ° ° ° ° °Want lab results °

## 2014-01-05 ENCOUNTER — Telehealth: Payer: Self-pay | Admitting: Cardiology

## 2014-01-05 DIAGNOSIS — I5022 Chronic systolic (congestive) heart failure: Secondary | ICD-10-CM

## 2014-01-05 DIAGNOSIS — I2510 Atherosclerotic heart disease of native coronary artery without angina pectoris: Secondary | ICD-10-CM

## 2014-01-05 DIAGNOSIS — E039 Hypothyroidism, unspecified: Secondary | ICD-10-CM

## 2014-01-05 LAB — DIGOXIN LEVEL: Digoxin Level: 0.6 ng/mL — ABNORMAL LOW (ref 0.8–2.0)

## 2014-01-05 MED ORDER — LEVOTHYROXINE SODIUM 75 MCG PO TABS: 75.0000 ug | ORAL_TABLET | Freq: Every day | ORAL | Status: DC

## 2014-01-05 NOTE — Telephone Encounter (Signed)
New message     Returning a nurses call.  She is calling for her father who does not speak english.

## 2014-01-05 NOTE — Telephone Encounter (Signed)
Notes Recorded by Larey Dresser, MD on 01/05/2014 at 8:03 AM Digoxin ok ------  Notes Recorded by Larey Dresser, MD on 01/04/2014 at 10:29 PM TSH low, decrease Levoxyl to 75 mcg/day and repeat TSH in 1 month. ------  Notes Recorded by Katrine Coho, RN on 01/04/2014 at 3:37 PM Preliminarily reviewed by Triage. Awaiting MD review and signature.

## 2014-01-05 NOTE — Telephone Encounter (Signed)
tsh ordered for one month. Med was reordered at new strength. Daughter needs a call back from Fritzi Mandes to discuss further needs, she was told she will be back tomorrow and will receive a call. Pt was to have A1C/ B-12 please review, daughter has questions about cholesterol med / liver enzymes.

## 2014-01-06 ENCOUNTER — Other Ambulatory Visit: Payer: Self-pay | Admitting: Cardiology

## 2014-01-06 MED ORDER — PRAVASTATIN SODIUM 80 MG PO TABS: 80.0000 mg | ORAL_TABLET | Freq: Every day | ORAL | Status: DC

## 2014-01-06 MED ORDER — DIGOXIN 125 MCG PO TABS: ORAL_TABLET | ORAL | Status: DC

## 2014-01-06 NOTE — Telephone Encounter (Signed)
Spoke with patient's daughter, Ronny Bacon. She is requesting refills of pravastatin and digoxin.

## 2014-01-13 ENCOUNTER — Ambulatory Visit (INDEPENDENT_AMBULATORY_CARE_PROVIDER_SITE_OTHER): Payer: Medicare Other | Admitting: *Deleted

## 2014-01-13 DIAGNOSIS — I4891 Unspecified atrial fibrillation: Secondary | ICD-10-CM

## 2014-01-13 DIAGNOSIS — Z5181 Encounter for therapeutic drug level monitoring: Secondary | ICD-10-CM

## 2014-01-13 LAB — POCT INR: INR: 2.7

## 2014-01-25 ENCOUNTER — Encounter: Payer: Self-pay | Admitting: Cardiology

## 2014-01-26 NOTE — Telephone Encounter (Signed)
Message copied by Katrine Coho on Tue Jan 26, 2014  9:43 AM ------      Message from: Larey Dresser      Created: Mon Jan 25, 2014 11:43 PM       Can you have him come by to get an ECG? Apparently HR is running higher that prior.  ------

## 2014-01-26 NOTE — Telephone Encounter (Signed)
Natasha did NOT think pt needed ECG today (earlier typo).

## 2014-01-26 NOTE — Telephone Encounter (Signed)
I spoke with Ronny Bacon, pt's daughter,  to arrange an appt for an ECG today. She states pt is back to normal today and is feeling good. She did think he needed ECG today. She will call back in symptoms change.

## 2014-01-28 ENCOUNTER — Ambulatory Visit: Payer: Medicare Other | Admitting: Family Medicine

## 2014-02-04 ENCOUNTER — Other Ambulatory Visit: Payer: Self-pay

## 2014-02-04 ENCOUNTER — Ambulatory Visit (INDEPENDENT_AMBULATORY_CARE_PROVIDER_SITE_OTHER): Payer: Medicare Other | Admitting: *Deleted

## 2014-02-04 ENCOUNTER — Other Ambulatory Visit (INDEPENDENT_AMBULATORY_CARE_PROVIDER_SITE_OTHER): Payer: Medicare Other

## 2014-02-04 DIAGNOSIS — Z5181 Encounter for therapeutic drug level monitoring: Secondary | ICD-10-CM

## 2014-02-04 DIAGNOSIS — E039 Hypothyroidism, unspecified: Secondary | ICD-10-CM

## 2014-02-04 DIAGNOSIS — I4891 Unspecified atrial fibrillation: Secondary | ICD-10-CM

## 2014-02-04 LAB — POCT INR: INR: 3.2

## 2014-02-04 LAB — TSH: TSH: 5.88 u[IU]/mL — ABNORMAL HIGH (ref 0.35–4.50)

## 2014-02-04 MED ORDER — LEVOTHYROXINE SODIUM 100 MCG PO TABS: 100.0000 ug | ORAL_TABLET | Freq: Every day | ORAL | Status: DC

## 2014-02-04 NOTE — Progress Notes (Signed)
Quick Note:  Preliminary report reviewed by triage nurse and sent to MD desk. ______ 

## 2014-02-08 ENCOUNTER — Telehealth: Payer: Self-pay | Admitting: *Deleted

## 2014-02-08 ENCOUNTER — Encounter: Payer: Self-pay | Admitting: Family Medicine

## 2014-02-08 ENCOUNTER — Ambulatory Visit (INDEPENDENT_AMBULATORY_CARE_PROVIDER_SITE_OTHER): Payer: Medicare Other | Admitting: Family Medicine

## 2014-02-08 DIAGNOSIS — E039 Hypothyroidism, unspecified: Secondary | ICD-10-CM

## 2014-02-08 DIAGNOSIS — G4700 Insomnia, unspecified: Secondary | ICD-10-CM

## 2014-02-08 DIAGNOSIS — R071 Chest pain on breathing: Secondary | ICD-10-CM

## 2014-02-08 DIAGNOSIS — R0789 Other chest pain: Secondary | ICD-10-CM

## 2014-02-08 DIAGNOSIS — R269 Unspecified abnormalities of gait and mobility: Secondary | ICD-10-CM

## 2014-02-08 DIAGNOSIS — R2681 Unsteadiness on feet: Secondary | ICD-10-CM

## 2014-02-08 MED ORDER — ZOLPIDEM TARTRATE 10 MG PO TABS: 10.0000 mg | ORAL_TABLET | Freq: Every evening | ORAL | Status: DC | PRN

## 2014-02-08 NOTE — Progress Notes (Signed)
This is an 80 year old gentleman from [place] who has an unstable gait. He's also taking low-dose Coumadin. Last Friday he was moving a chair on his patio and got caught on another chair causing him to fall down. He did not have any pain initially but has developed some mild left sided anterior chest wall pain. Daughter and wife are here with him today to have this evaluated.  Patient also has problems with sleep and would like a refill on his Ambien.  Objective: Patient's alert, friendly, cheerful, and cooperative HEENT: Unremarkable Extremities: Ecchymosis both forearms on the dorsal wrist Chest: Clear, no significant chest wall pain or ecchymosis Heart: Occasional irregular, grade 1/6 systolic murmur, no rub or gallop Extremities: Minimal edema Neurological: Cranial nerves III through XII intact, moving 4 extremities equally, positive Romberg    Assessment: 80 year old elderly gentleman with an unstable gait fell 3 days ago but no serious signs of a problem. He also has insomnia for which the Ambien seems to be the best medicine. We discussed whether he would be willing to try some other products, but after discussing the possible side effects, he decided to stick with Ambien  Plan: I talked about doing some exercises to improve balance, continue walks for 15 minutes in the morning and at night, and I refilled his Ambien  Signed, Carola Frost.D.

## 2014-02-08 NOTE — Telephone Encounter (Signed)
Advised daughter 

## 2014-02-08 NOTE — Telephone Encounter (Signed)
Message copied by Earvin Hansen on Mon Feb 08, 2014  9:29 AM ------      Message from: Larey Dresser      Created: Fri Feb 05, 2014 11:42 AM       Can repeat TSH 6 wks ------

## 2014-02-08 NOTE — Patient Instructions (Signed)
?????????? (Insomnia) ?????????? - ?????????, ??? ??????? ???????? ????? ?????? ?????? ?/??? ?????? ??? ????. ?????????? ????? ???? ??????- ? ?????????????? ????????. ??? ???????? ????????? ?????? ??????????????. ??????????????? ?????????? - ??? ????????? ???, ????????? ????? ???????? ??? ?????????????. ?????????? ??????????, ??? ???????, ???????? ??????????? ??????????? ??????? ? ???? ????????????? ?/??? ??????????? ???????????. ?? ???????? ?????????? ??? ?????? ?????????? ?????????. ???? ????? ?????????????? ??????? ???????? ????? ??????? ?????????? ???????, ??? ??? ??? ???????? ????????, ? ???? ??????????? ??????????? ?? ???????? ??????????.  ???????  ??????, ????????????, ?????????.  ?????????? ???????????.  ??????????? ??????? ? ???????, ? ???????, ?????????.  ???????? ?????????? ??????????????? ????????? ?? ????, ??? ???????? ????? ?? ????..  ????????????? ????????? ??????????????? ????? ???, ??? ?????? ?? ???.  ?????? ??????????? ?????????? ??????????????? ????? ????.  ???????????? ???????? ? ?????????????? ???????. ??? ????? ????????? ???????? ?? ????, ??? ??? ????? ???????? ?????????? ??????? ??? ???.  ???????????? ????????????, ????????, ???????, ?? ????????? ????? ?? ???.  ??????? ??????? ? ?????? ????? ????? ???? ???????? ??????????.  ?????????? ?????????? ?????? ???????.  ????????? ??????? ?????? ????? ????? ????????? ????????? ??? (??????? ?? ???????). ?????? ????? ?????? ?????????? ?? ?????? ?????? ??? ?????? ?????????. ?????????? ??????????? ?? ???????, ????? ? ??? ???????? ?????????????? ??????????? ??????? ?? ????? ??????? ??? (??????? ??????? ?????). ????? ??????? ?????????? ????????????????? ????? ????????. ???? ?? ?? ?????????? ????/????, ???? ??????????? ?? ???? ??????? ?? ????? ? ???????????? ?????????????? ??????????, ?? ?????? ?????????? ? ??????????? ??????? ?? ??????? ????????? ???, ??? ???????? ???????????????? ?????????? ??? ?????? ???. ??????? ????? - ???  ?????????, ??????? ??????? ???????????? ????? ? ???????????? ???????. ???????? ????? ??? ??????? ????? ??????????, ? ????? ??? ???? ??????????? ????? ??????? ?????????? ??? ?????? ???.  ????????   ??????? ????????? ?? ?????.  ?????????????? ? ??????? ???????????? ?? ????? ?????? ?? ???.  ????????? ? ???, ????? ??????? ? ?????????? ????? ??? ????. ???????  ???? ????? ????????? ??? ??????? ??? ??????? ?? ?????????, ??????? ????? ???????? ??????????. ???? ????? ?????? ???, ???? ? ????? ??????????? ?? ???????????? ???????? ??? ?????????????? ?????????. ?????????? ?????? ???????? ???????? ???????? ? ??????? ?????????? ?? ??????????.  ????? ????? ???? ???????? ??????? ???? ???????????????? ???????. ??? ???????, ????? ???????? ?? ????????????? ??? ??????????? ?????????????.  ?????????????? ????????? ?? ?????????? ?? ????????????? ??? ????????????? ??????????. ??? ????? ???????? ??????????.  ????? ?????????? ?????????? ?? ?????????? ????? ?????????????? ????????? ????????? ????????, ? ?????????:  ????????????? ??????? ????????????, ??????? ??????????????? ??????? ? ??????? ?????????? ? ??????.  ??????? ??????? ??????????? ???????????? ?? ????? ????? ????? ? ??????? ???.  ????????? ????? ? ??????? ??????? ?????????? ????? ???? ?????? ???? ?? ????? ?????? ????? ? ??????? ???. ?????? ????? ?? ?????? ?????????.  ?????????? ??????????? ??????? ?????? ?? ???.  ????????? ?????????????? ????? ???????????? ??? ????, ????? ?????????? ?? ???????? ? ????????? ?????????????.  ????????????? ?????? ? ???????? ???? ????? ????????? ????????, ???? ??? ?????? ????? ??????? ????, ??????? ?? ?? ?????? ?????????.  ??????????? ????????????? ??????, ??????? ??????? ??????? ???????????? ????????, ??? ??????? ?? ??? ?? ?????? ?? ???. ?????????? ?? ??????? ? ???????? ????????  ?????? ???????. ????????? ????? ? ??????? ???????, ? ??? ????? ? ? ????????? ???????? ???????? ??????????? ??????????. ????????? ?????????  ?????. ????????? ??? ????:  ?????? ????? ?????? ???.  ?????? ????? ?????? ????????????? ? ??????? ????.  ?????? ????????????????? ???.  ???? ???????????? ?? ?????. ?????? ?????????? ??????? ????? ?????????????? ???? ???????.  ????????? ? ???????, ???? ? ??????? 15 ????? ????? ???????????, ?? ??? ??? ?? ?????? ???????. ????????? ??? ????????? ??? ???-?????? ??????????????. ?? ????????? ?????? ?????. ?????????, ????? ????? ???????? ??????? ??????????, ? ?????? ????? ????????????? ? ???????.  ?????????? ?????????? ????? ?????? ?? ??? ? ???????????. ?? ?????????? ???? ??????? ? ??????? ???.  ????????? ??????????? ??????????? ????????????.  ????????? ????? ????????????? ?? ????? ?????? ?? ???. ? ??????? ?????????, ????????, ???????? ??????????? ??? ????? ???????, ????????? ??????? ???????????? ????????, ????? ??? ???????? ??????? ??????? ?????? ? ??????? ????? ?????.  ???????? ?????? ?????? ?? ???: ?????? ??????????????? ? ???? ? ?? ?? ????? ?????????? ???, ??????? ????, ????????????? ? ???????, ???????? ????? ???? ???????? ??????. ????????? ????? ????????? ?????? ???????? ????? ???????? ???????.  ?????????? ??????? ????????????, ??????? ?????????? ?? ?????????????? ???????????? ??????? ? ?????????? ?????????? ? ??????. ????? ??????? ????? ???????????? ???????????? ????? ???? ?????? ???????? ?????. ?? ????? ?????? ????????? ?? ??????? ?????? ? ???????????, ?????? ??????? ???? ???-?? ??????? ????????? ?????????????, ???????? ??????????? ????????? ????. ?? ?????? ???? ????????????? ???????, ???????????, ????????, ??? ??????????? ???? ?????????? ?????? ?? ?????? ????? ?? ?????? ????? ?????? ????.  ? ??????? ??? ???????????? ????????? ?????????? ?????????? ????????. ???? ??? ??????????, ??????????? ???? ?? ??????, ????????? ????, ??? ?????????? ??????????? ??????????? ??????? ?? ??? ????????. ?? ??????:  ?? ????????? ?????? ?????????? ?? ????? ????? ???????  ?????? ?????????????? ????????? ?????  ??????? ?? ????? ??????????  ?????????? ?????????? ?? ??????????? ??????? ???????? ????????????  Document Released: 09/10/2005 Document Revised: 12/03/2011 [facility] Patient Information 2014 [place], [place].

## 2014-02-18 ENCOUNTER — Ambulatory Visit (INDEPENDENT_AMBULATORY_CARE_PROVIDER_SITE_OTHER): Payer: Medicare Other | Admitting: Pharmacist Clinician (PhC)/ Clinical Pharmacy Specialist

## 2014-02-18 DIAGNOSIS — I4891 Unspecified atrial fibrillation: Secondary | ICD-10-CM

## 2014-02-18 DIAGNOSIS — Z5181 Encounter for therapeutic drug level monitoring: Secondary | ICD-10-CM

## 2014-02-18 LAB — POCT INR: INR: 2

## 2014-02-22 ENCOUNTER — Other Ambulatory Visit: Payer: Self-pay | Admitting: Cardiology

## 2014-03-04 ENCOUNTER — Other Ambulatory Visit: Payer: Self-pay | Admitting: Cardiology

## 2014-03-18 ENCOUNTER — Ambulatory Visit (INDEPENDENT_AMBULATORY_CARE_PROVIDER_SITE_OTHER): Payer: Medicare Other

## 2014-03-18 DIAGNOSIS — I4891 Unspecified atrial fibrillation: Secondary | ICD-10-CM

## 2014-03-18 DIAGNOSIS — Z5181 Encounter for therapeutic drug level monitoring: Secondary | ICD-10-CM

## 2014-03-18 LAB — POCT INR: INR: 2.7

## 2014-03-23 ENCOUNTER — Telehealth: Payer: Self-pay | Admitting: Cardiology

## 2014-03-23 ENCOUNTER — Encounter: Payer: Self-pay | Admitting: Cardiology

## 2014-03-23 ENCOUNTER — Other Ambulatory Visit (INDEPENDENT_AMBULATORY_CARE_PROVIDER_SITE_OTHER): Payer: Medicare Other

## 2014-03-23 DIAGNOSIS — E039 Hypothyroidism, unspecified: Secondary | ICD-10-CM

## 2014-03-23 DIAGNOSIS — I480 Paroxysmal atrial fibrillation: Secondary | ICD-10-CM

## 2014-03-23 NOTE — Telephone Encounter (Signed)
Pt had EKG done today and it was reviewed by Dr Burt Knack.  Dr Burt Knack ordered digoxin level.

## 2014-03-23 NOTE — Telephone Encounter (Signed)
Needs to come for ECG.  Can he be added on to a PA schedule tomorrow? I am DOD so in hospital.

## 2014-03-23 NOTE — Telephone Encounter (Signed)
New message  Pt daughter called states the pt is having a high pulse rate. States that he has fallen twice. Requests a call back to discuss.

## 2014-03-23 NOTE — Telephone Encounter (Signed)
Pt walked in requesting an EKG, he thought he might be in atrial fibrillation.  EKG done and reviewed by Dr Burt Knack.  EKG--A Tach with junctional escape. Dr Burt Knack ordered digoxin level.   Will forward to Dr Aundra Dubin.

## 2014-03-24 ENCOUNTER — Other Ambulatory Visit: Payer: Medicare Other

## 2014-03-24 LAB — TSH: TSH: 2.43 u[IU]/mL (ref 0.35–4.50)

## 2014-03-24 LAB — DIGOXIN LEVEL: Digoxin Level: 0.7 ng/mL — ABNORMAL LOW (ref 0.8–2.0)

## 2014-03-25 ENCOUNTER — Encounter: Payer: Self-pay | Admitting: Cardiology

## 2014-03-25 ENCOUNTER — Other Ambulatory Visit: Payer: Medicare Other

## 2014-03-25 ENCOUNTER — Ambulatory Visit (INDEPENDENT_AMBULATORY_CARE_PROVIDER_SITE_OTHER): Payer: Medicare Other | Admitting: Cardiology

## 2014-03-25 ENCOUNTER — Encounter: Payer: Self-pay | Admitting: *Deleted

## 2014-03-25 VITALS — BP 124/74 | HR 69 | Ht 64.0 in | Wt 140.4 lb

## 2014-03-25 DIAGNOSIS — I2510 Atherosclerotic heart disease of native coronary artery without angina pectoris: Secondary | ICD-10-CM

## 2014-03-25 DIAGNOSIS — I471 Supraventricular tachycardia: Secondary | ICD-10-CM

## 2014-03-25 DIAGNOSIS — I498 Other specified cardiac arrhythmias: Secondary | ICD-10-CM

## 2014-03-25 DIAGNOSIS — E785 Hyperlipidemia, unspecified: Secondary | ICD-10-CM

## 2014-03-25 DIAGNOSIS — I5022 Chronic systolic (congestive) heart failure: Secondary | ICD-10-CM

## 2014-03-25 NOTE — Telephone Encounter (Signed)
Ronny Bacon, pt's daughter, advised I have scheduled an appt for pt to see Dr Aundra Dubin today at [facility].

## 2014-03-25 NOTE — Patient Instructions (Signed)
Your physician has recommended that you have a Cardioversion (DCCV). Electrical Cardioversion uses a jolt of electricity to your heart either through paddles or wired patches attached to your chest. This is a controlled, usually prescheduled, procedure. Defibrillation is done under light anesthesia in the hospital, and you usually go home the day of the procedure. This is done to get your heart back into a normal rhythm. You are not awake for the procedure. Please see the instruction sheet given to you today. Monday July 13,2015  Schedule an appointment in the coumadin clinic for a pro-ime before you go to the hospital for the [place] on Monday morning July 13.2015. You need to be at the hospitl at 8:30AM.  Schedule an appointment for a pro-ime next week.  Your physician recommends that you schedule a follow-up appointment 2 weeks after the cardioversion with Dr Aundra Dubin in the [facility].

## 2014-03-26 NOTE — Progress Notes (Signed)
Patient ID: [name], male   DOB: 1926/09/19, 80 y.o.   MRN: [id number] PCP: Dr. Joseph Art  80 yo with history of CAD, ischemic CMP, CKD, and paroxysmal atrial fibrillation/flutter presents for cardiology followup. He speaks little [place] and his daughter interprets.  No chest pain.  Patient has a history of myalgias with Crestor and Lipitor.  He has tolerated pravastatin without myalgias.  At a prior appointment, he was noted to be back in atypical atrial flutter versus atrial tachycardia.  It was rate-controlled and initially I thought to treat him with a rate control/anticoagulation strategy.  However, he started to notice increased exertional dyspnea.  Therefore, I took him for cardioversion back to NSR, in 1/15.  He has been on amiodarone.  Earlier this week, he noted that his pulse was higher than usual (up to the 90s, he checks his pulse daily).  He came to the office for an ECG yesterday and was noted to be in atrial tachycardia with probable junctional escape with HR in 60s.  He is back for office visit today and remains in atrial tachycardia with junctional escape in the 60s.  He is very anxious about this, but symptoms seem to be about the same. He walks about 1 mile most days without exertional dyspnea.  No chest pain.  No dizziness/lightheadedness. His appetite is not great.    Labs (9/11): K 4.1, creatinine 1.4  Labs (8/12): K 3.8, creatinine 1.3, TSH normal, LFTs normal, LDL 96, HDL 50 Labs (1/13): K 3.9, creatinine 1.3, TSH normal, LFTs normal Labs (2/13): K 3.8, creatinine 1.3 Labs (5/13): K 3.5, creatinine 1.4, BNP 331, LFTs normal, TSH normal Labs (9/13): LDL 85, HDL 53, BNP 547, LFTs normal Labs (12/13): K 4.3, creatinine 1.49 => 1.4, digoxin 1.4, TSH normal, LFTs normal Labs (1/14): digoxin 1.0, K 4.3, creatinine 1.5 Labs (3/14): free T4 normal Labs (4/14): K 5=> 3.8, creatinine 1.8 => 1.4, digoxin 0.6 Labs (5/14): TSH normal, digoxin level normal Labs (8/14): K 4.2,  creatinine 1.66, AST 42, ALT 37 Labs (9/14): K 4, creatinine 1.6, digoxin 1.3, LDL 104, HDL 51, AST 41, ALT 30 Labs (11/14): K 3.6, creatinine 1.5, BNP 126 Labs (12/14): TSH 6.93 Labs (2/15): K 3.7, creatinine 1.4, TSH normal, AST 59, ALT 56 Labs (3/15): TSH low, AST 57, ALT 30, digoxin 1.2 Labs (4/15): K 4, creatinine 1.4, LFTs normal Labs (6/15): TSH normal, digoxin level 0.7  ECG: atrial tachycardia with junctional escape in 60s.  Morphology of atrial activity is similar to back in 1/15.    Allergies (verified):  1) ! Crestor  2) Vytorin  3) Atenolol   Past Medical History:  1. Coronary artery disease: LHC 4/06 with 60% pLAD, 40% ramus, 60% mRCA, and 90% mPDA. No intervention. Inferior akinesis on LV-gram was suggestive of possible inferior MI with recanalization.  2. Hyperlipidemia: Myalgias with Lipitor and Crestor.  3. Hypothyroidism  4. Peripheral vascular disease  5. Anxiety  6. Skin cancer, hx of 2008 BCC  7. Colonic polyps, hx of  8. Paroxysmal atrial fibrillation with rapid ventricular response treated with amiodarone therapy converting to normal sinus rhythm 02/2009.  He has also had atypical atrial flutter versus atrial tachycardia noted in 4/14, cardioverted to NSR in 4/14. In atypical flutter versus atrial tachycardia again in 1/15, DCCV in 1/15 to NSR. Atrial tachycardia in 7/15 with junctional escape in 60s.  9. Systolic CHF: Probable ischemic cardiomyopathy. Echo (6/10) with EF 40%, mildly dilated LV, moderate MR.  Echo (3/12): EF  30-35%, inferolateral severe hypokinesis, trivial AI, moderate to severe MR.  10. History of PNA  11. CKD. Baseline creatinine around 1.4.  12. Mitral regurgitation: Moderate by echo 6/10. 2-3+ by cath 2006.  Moderate-severe by echo in 3/12.  13. PVCs  14. BPH 15. Recurrent UTIs 16. Imbalance 17. Carotid stenosis: Dopplers (2/15) with 40-59% BICA stenosis.   Family History:  Family History of CAD Male 1st degree relative <60   Social  History:  Retired  Originally from [place], speaks little [place]  Married  Former Smoker   ROS: All systems reviewed and negative except as per HPI.   Current Outpatient Prescriptions  Medication Sig Dispense Refill  . amiodarone (PACERONE) 200 MG tablet Take 1 tablet (200 mg total) by mouth daily.      Marland Kitchen co-enzyme Q-10 30 MG capsule Take 3 capsules (90 mg total) by mouth daily.  90 capsule  3  . digoxin (LANOXIN) 0.125 MG tablet 1/2 tablet every other day  45 tablet  3  . furosemide (LASIX) 40 MG tablet 1 tablet (total 40mg ) in the AM  90 tablet  3  . KLOR-CON M10 10 MEQ tablet TAKE 2 TABLETS (20 MEQ TOTAL) BY MOUTH DAILY.  180 tablet  0  . levothyroxine (SYNTHROID, LEVOTHROID) 100 MCG tablet Take 1 tablet (100 mcg total) by mouth daily before breakfast.  90 tablet  1  . lisinopril (PRINIVIL,ZESTRIL) 5 MG tablet Take 1 tablet (5 mg total) by mouth daily.  90 tablet  3  . metoprolol succinate (TOPROL-XL) 25 MG 24 hr tablet TAKE 1/2 TABLET (TOTAL 12.5MG ) TWO TIMES A DAY  90 tablet  0  . Multiple Vitamin (MULTIVITAMIN) tablet Take 1 tablet by mouth daily.      . Omega-3 Fatty Acids (FISH OIL) 1200 MG CAPS Take 1 capsule (1,200 mg total) by mouth daily.  90 capsule  3  . pravastatin (PRAVACHOL) 80 MG tablet Take 1 tablet (80 mg total) by mouth daily.  90 tablet  3  . tamsulosin (FLOMAX) 0.4 MG CAPS capsule Take 1 capsule (0.4 mg total) by mouth daily. PATIENT NEEDS OFFICE VISIT FOR ADDITIONAL REFILLS  90 capsule  3  . warfarin (COUMADIN) 1 MG tablet 1 pill (1mg ) everyday except 2 pills (2mg ) on  Mondays, Thursdays and Saturdays or as directed by coumadin clinic  60 tablet  3  . zolpidem (AMBIEN) 10 MG tablet Take 1 tablet (10 mg total) by mouth at bedtime as needed for sleep.  30 tablet  5   No current facility-administered medications for this visit.    BP 124/74  Pulse 69  Ht 5\' 4"  (1.626 m)  Wt 63.685 kg (140 lb 6.4 oz)  BMI 24.09 kg/m2 General: NAD Neck: No JVD; no thyromegaly or  thyroid nodule.  Lungs: CTAB  CV: Nondisplaced PMI.  Heart regular S1/S2, no S3/S4, 1/6 HSM at apex.  No edema.  Right carotid bruit. Abdomen: Soft, nontender, no hepatosplenomegaly, no distention.  Neurologic: Alert and oriented x 3.  Psych: Normal affect. Extremities: No clubbing or cyanosis.   Assessment/Plan: 1. Atrial arrhythmias  Patient appears to be in atrial tachycardia today with junctional escape in the 60s.  The morphology of the atrial activity is similar to the 1/15 episode.  He has been on amiodarone.  INR has been therapeutic for months.  His symptoms have not appreciably changed, but the last 2 times this has occurred, he has developed worsening dyspnea over time and also gets significant anxiety. I think that  he is going to need to be cardioverted.  Ventricular rate is not high, I will not increase his beta blocker or amiodarone. Digoxin level done this week was not elevated.  I will arrange for cardioversion.  He wants to wait until I am back from vacation the week after next.  However, if symptoms worsen, I told him that Dr Haroldine Laws will be covering for me and could cardiovert him.  2. HYPERLIPIDEMIA  He is tolerating pravastatin without myalgias. Had myalgias with Lipitor and Crestor.  LDL not ideal, but will continue current pravastatin as he tolerates it.   3. MITRAL INSUFFICIENCY Last echo showed moderate to severe MR. Given age and medical condition, would plan medical management.  4. SYSTOLIC HEART FAILURE, CHRONIC  EF 30-35% on last echo. Breathing is stable, NYHA class II symptoms.  He does not appear volume overloaded on exam.  - Continue current digoxin dose, level this week was ok.  - Continue current doses of Toprol XL and lisinopril.  - Continue current dose of Lasix  - Would avoid ICD at his age.  5. Carotid stenosis: Moderate bilateral stenosis, repeat study in 2/16.   Loralie Champagne 03/26/2014

## 2014-03-30 ENCOUNTER — Encounter (HOSPITAL_COMMUNITY): Payer: Self-pay | Admitting: Pharmacy Technician

## 2014-04-05 ENCOUNTER — Ambulatory Visit (INDEPENDENT_AMBULATORY_CARE_PROVIDER_SITE_OTHER): Payer: Medicare Other

## 2014-04-05 ENCOUNTER — Ambulatory Visit (HOSPITAL_COMMUNITY)
Admission: RE | Admit: 2014-04-05 | Discharge: 2014-04-05 | Disposition: A | Discharge status: Home / Self Care | Payer: Medicare Other | Source: Ambulatory Visit | Attending: Cardiology | Admitting: Cardiology

## 2014-04-05 ENCOUNTER — Encounter (HOSPITAL_COMMUNITY)
Admission: RE | Disposition: A | Discharge status: Home / Self Care | Payer: Medicare Other | Source: Ambulatory Visit | Attending: Cardiology

## 2014-04-05 ENCOUNTER — Encounter (HOSPITAL_COMMUNITY): Payer: Self-pay | Admitting: Certified Registered Nurse Anesthetist

## 2014-04-05 DIAGNOSIS — Z5181 Encounter for therapeutic drug level monitoring: Secondary | ICD-10-CM

## 2014-04-05 DIAGNOSIS — I498 Other specified cardiac arrhythmias: Secondary | ICD-10-CM | POA: Diagnosis present

## 2014-04-05 DIAGNOSIS — I4891 Unspecified atrial fibrillation: Secondary | ICD-10-CM

## 2014-04-05 DIAGNOSIS — Z5309 Procedure and treatment not carried out because of other contraindication: Secondary | ICD-10-CM | POA: Diagnosis not present

## 2014-04-05 LAB — POCT INR: INR: 3

## 2014-04-05 SURGERY — CANCELLED PROCEDURE

## 2014-04-05 NOTE — Procedures (Signed)
Patient was admitted for DCCV.  However, he felt like he went back into NSR last night and indeed is in NSR this morning.  Therefore, no cardioversion needed and patient was sent home.  No change to medications.   [name] 04/05/2014

## 2014-04-05 NOTE — Progress Notes (Signed)
Patient on sinus brady 12 lead ekg seen by Dr Aundra Dubin. Patient can go home with no changes from her meds.

## 2014-04-06 ENCOUNTER — Ambulatory Visit: Payer: Medicare Other | Admitting: Cardiology

## 2014-04-26 ENCOUNTER — Ambulatory Visit (INDEPENDENT_AMBULATORY_CARE_PROVIDER_SITE_OTHER): Payer: Medicare Other | Admitting: Cardiology

## 2014-04-26 ENCOUNTER — Ambulatory Visit (INDEPENDENT_AMBULATORY_CARE_PROVIDER_SITE_OTHER): Payer: Medicare Other

## 2014-04-26 ENCOUNTER — Encounter: Payer: Self-pay | Admitting: Cardiology

## 2014-04-26 VITALS — BP 95/57 | HR 55 | Ht 64.0 in | Wt 139.0 lb

## 2014-04-26 DIAGNOSIS — I4891 Unspecified atrial fibrillation: Secondary | ICD-10-CM

## 2014-04-26 DIAGNOSIS — Z5181 Encounter for therapeutic drug level monitoring: Secondary | ICD-10-CM

## 2014-04-26 DIAGNOSIS — I498 Other specified cardiac arrhythmias: Secondary | ICD-10-CM

## 2014-04-26 DIAGNOSIS — I2510 Atherosclerotic heart disease of native coronary artery without angina pectoris: Secondary | ICD-10-CM

## 2014-04-26 DIAGNOSIS — I5022 Chronic systolic (congestive) heart failure: Secondary | ICD-10-CM

## 2014-04-26 DIAGNOSIS — I471 Supraventricular tachycardia: Secondary | ICD-10-CM

## 2014-04-26 DIAGNOSIS — I480 Paroxysmal atrial fibrillation: Secondary | ICD-10-CM

## 2014-04-26 LAB — BASIC METABOLIC PANEL
BUN: 28 mg/dL — ABNORMAL HIGH (ref 6–23)
CHLORIDE: 102 meq/L (ref 96–112)
CO2: 26 meq/L (ref 19–32)
CREATININE: 1.7 mg/dL — AB (ref 0.4–1.5)
Calcium: 8.6 mg/dL (ref 8.4–10.5)
GFR: 40.79 mL/min — ABNORMAL LOW (ref >60.00)
Glucose, Bld: 127 mg/dL — ABNORMAL HIGH (ref 70–99)
Potassium: 4.1 mEq/L (ref 3.5–5.1)
SODIUM: 134 meq/L — AB (ref 135–145)

## 2014-04-26 LAB — POCT INR: INR: 4

## 2014-04-26 LAB — HEPATIC FUNCTION PANEL
ALK PHOS: 47 U/L (ref 39–117)
ALT: 45 U/L (ref 0–53)
AST: 54 U/L — ABNORMAL HIGH (ref 0–37)
Albumin: 3.5 g/dL (ref 3.5–5.2)
BILIRUBIN DIRECT: 0 mg/dL (ref 0.0–0.3)
BILIRUBIN TOTAL: 0.7 mg/dL (ref 0.2–1.2)
TOTAL PROTEIN: 7.1 g/dL (ref 6.0–8.3)

## 2014-04-26 MED ORDER — METOPROLOL SUCCINATE ER 25 MG PO TB24: ORAL_TABLET | ORAL | Status: DC

## 2014-04-26 NOTE — Patient Instructions (Signed)
Decrease metoprolol succinate (Toprol XL) to 12.5mg  daily in the evening. This will be 1/2 of a 25mg  tablet daily in the evening.   Your physician recommends that you have lab work today--BMET/Liver profile.   Your physician recommends that you schedule a follow-up appointment in: 2 months with Dr Aundra Dubin.

## 2014-04-26 NOTE — Progress Notes (Signed)
Patient ID: [name], male   DOB: [birth year], 80 y.o.   MRN: [id number] PCP: Dr. Joseph Art  80 yo with history of CAD, ischemic CMP, CKD, and paroxysmal atrial fibrillation/flutter presents for cardiology followup. He speaks little [place] and his daughter interprets.  No chest pain.  Patient has a history of myalgias with Crestor and Lipitor.  He has tolerated pravastatin without myalgias.  At a prior appointment, he was noted to be back in atypical atrial flutter versus atrial tachycardia.  It was rate-controlled and initially I thought to treat him with a rate control/anticoagulation strategy.  However, he started to notice increased exertional dyspnea.  Therefore, I took him for cardioversion back to NSR, in 1/15.  He has been on amiodarone.  At last appointment, he was in atrial tachycardia again.  I arranged for cardioversion but he went back into NSR on his own.  He is in NSR today. He walks about 1 mile most days without exertional dyspnea.  No chest pain.  Weight is stable.  He feels like his balance is off and walks on his wife's arm most of the time.  No falls.  BP and HR are running on the lower side (HR in 50s).    Labs (9/11): K 4.1, creatinine 1.4  Labs (8/12): K 3.8, creatinine 1.3, TSH normal, LFTs normal, LDL 96, HDL 50 Labs (1/13): K 3.9, creatinine 1.3, TSH normal, LFTs normal Labs (2/13): K 3.8, creatinine 1.3 Labs (5/13): K 3.5, creatinine 1.4, BNP 331, LFTs normal, TSH normal Labs (9/13): LDL 85, HDL 53, BNP 547, LFTs normal Labs (12/13): K 4.3, creatinine 1.49 => 1.4, digoxin 1.4, TSH normal, LFTs normal Labs (1/14): digoxin 1.0, K 4.3, creatinine 1.5 Labs (3/14): free T4 normal Labs (4/14): K 5=> 3.8, creatinine 1.8 => 1.4, digoxin 0.6 Labs (5/14): TSH normal, digoxin level normal Labs (8/14): K 4.2, creatinine 1.66, AST 42, ALT 37 Labs (9/14): K 4, creatinine 1.6, digoxin 1.3, LDL 104, HDL 51, AST 41, ALT 30 Labs (11/14): K 3.6, creatinine 1.5, BNP 126 Labs  (12/14): TSH 6.93 Labs (2/15): K 3.7, creatinine 1.4, TSH normal, AST 59, ALT 56 Labs (3/15): TSH low, AST 57, ALT 30, digoxin 1.2 Labs (4/15): K 4, creatinine 1.4, LFTs normal Labs (6/15): TSH normal, digoxin level 0.  ECG: NSR, 1st degree AV block, poor R wave progression  Allergies (verified):  1) ! Crestor  2) Vytorin  3) Atenolol   Past Medical History:  1. Coronary artery disease: LHC 4/06 with 60% pLAD, 40% ramus, 60% mRCA, and 90% mPDA. No intervention. Inferior akinesis on LV-gram was suggestive of possible inferior MI with recanalization.  2. Hyperlipidemia: Myalgias with Lipitor and Crestor.  3. Hypothyroidism  4. Peripheral vascular disease  5. Anxiety  6. Skin cancer, hx of 2008 BCC  7. Colonic polyps, hx of  8. Paroxysmal atrial fibrillation with rapid ventricular response treated with amiodarone therapy converting to normal sinus rhythm 02/2009.  He has also had atypical atrial flutter versus atrial tachycardia noted in 4/14, cardioverted to NSR in 4/14. In atypical flutter versus atrial tachycardia again in 1/15, DCCV in 1/15 to NSR. Atrial tachycardia in 7/15 with junctional escape in 60s.  9. Systolic CHF: Probable ischemic cardiomyopathy. Echo (6/10) with EF 40%, mildly dilated LV, moderate MR.  Echo (3/12): EF 30-35%, inferolateral severe hypokinesis, trivial AI, moderate to severe MR.  10. History of PNA  11. CKD. Baseline creatinine around 1.4.  12. Mitral regurgitation: Moderate by echo 6/10. 2-3+ by cath  2006.  Moderate-severe by echo in 3/12.  13. PVCs  14. BPH 15. Recurrent UTIs 16. Imbalance 17. Carotid stenosis: Dopplers (2/15) with 40-59% BICA stenosis.   Family History:  Family History of CAD Male 1st degree relative <60   Social History:  Retired  Originally from [place], speaks little [place]  Married  Former Smoker   ROS: All systems reviewed and negative except as per HPI.   Current Outpatient Prescriptions  Medication Sig Dispense Refill   . amiodarone (PACERONE) 200 MG tablet Take 1 tablet (200 mg total) by mouth daily.      Marland Kitchen co-enzyme Q-10 30 MG capsule Take 90 mg by mouth daily.      . digoxin (LANOXIN) 0.125 MG tablet Take 0.0625 mg by mouth every other day. 1/2 tablet every other day      . furosemide (LASIX) 40 MG tablet Take 40 mg by mouth daily. 1 tablet (total 40mg ) in the AM      . levothyroxine (SYNTHROID, LEVOTHROID) 100 MCG tablet Take 100 mcg by mouth daily before breakfast.      . lisinopril (PRINIVIL,ZESTRIL) 5 MG tablet Take 1 tablet (5 mg total) by mouth daily.  90 tablet  3  . Multiple Vitamin (MULTIVITAMIN) tablet Take 1 tablet by mouth daily.      . Omega-3 Fatty Acids (FISH OIL) 1200 MG CAPS Take 1 capsule (1,200 mg total) by mouth daily.  90 capsule  3  . potassium chloride (K-DUR,KLOR-CON) 10 MEQ tablet Take 20 mEq by mouth daily.      . pravastatin (PRAVACHOL) 80 MG tablet Take 1 tablet (80 mg total) by mouth daily.  90 tablet  3  . tamsulosin (FLOMAX) 0.4 MG CAPS capsule Take 0.4 mg by mouth daily.      Marland Kitchen warfarin (COUMADIN) 1 MG tablet Take 1-2 mg by mouth daily. 1 pill (1mg ) everyday except 2 pills (2mg ) on  Mondays, Thursdays and Saturdays or as directed by coumadin clinic      . zolpidem (AMBIEN) 10 MG tablet Take 1 tablet (10 mg total) by mouth at bedtime as needed for sleep.  30 tablet  5  . metoprolol succinate (TOPROL XL) 25 MG 24 hr tablet 1/2 tablet (12.5mg ) daily in the evening  45 tablet  1   No current facility-administered medications for this visit.    BP 95/57  Pulse 55  Ht 5\' 4"  (1.626 m)  Wt 139 lb (63.05 kg)  BMI 23.85 kg/m2 General: NAD Neck: No JVD; no thyromegaly or thyroid nodule.  Lungs: CTAB  CV: Nondisplaced PMI.  Heart regular S1/S2, no S3/S4, 1/6 HSM at apex.  No edema.  Right carotid bruit. Abdomen: Soft, nontender, no hepatosplenomegaly, no distention.  Neurologic: Alert and oriented x 3.  Psych: Normal affect. Extremities: No clubbing or cyanosis.    Assessment/Plan: 1. Atrial arrhythmias  Patient remains in NSR today (was in atrial tachycardia at last appointment but converted to NSR on his own).  Continue amiodarone and warfarin.  Recent TSH normal.  Will check LFTs today.   2. HYPERLIPIDEMIA  He is tolerating pravastatin without myalgias. Had myalgias with Lipitor and Crestor.  LDL not ideal, but will continue current pravastatin as he tolerates it.   3. MITRAL INSUFFICIENCY Last echo showed moderate to severe MR. Given age and medical condition, would plan medical management.  4. SYSTOLIC HEART FAILURE, CHRONIC  EF 30-35% on last echo. Breathing is stable, NYHA class II symptoms.  He does not appear volume overloaded on  exam.  - Continue current digoxin dose, recent level was ok.   - Continue current doses of Lasix and lisinopril.  - Will decrease Toprol XL to 12.5 mg daily, take in evening (given mild bradycardia and SBP in 90s at times).  5. Carotid stenosis: Moderate bilateral stenosis, repeat study in 2/16.   Loralie Champagne 04/26/2014

## 2014-04-27 ENCOUNTER — Telehealth: Payer: Self-pay | Admitting: Cardiology

## 2014-04-27 ENCOUNTER — Encounter: Payer: Self-pay | Admitting: Cardiology

## 2014-04-27 DIAGNOSIS — I5022 Chronic systolic (congestive) heart failure: Secondary | ICD-10-CM

## 2014-04-27 NOTE — Telephone Encounter (Signed)
New message    Daughter calling for test results.

## 2014-04-27 NOTE — Telephone Encounter (Signed)
Notes Recorded by Larey Dresser, MD on 04/27/2014 at 3:18 PM Decrease Lasix to 20 mg daily and repeat BMET 2 wks.  Spoke with Ronny Bacon

## 2014-04-28 ENCOUNTER — Other Ambulatory Visit: Payer: Self-pay | Admitting: *Deleted

## 2014-04-28 ENCOUNTER — Other Ambulatory Visit (INDEPENDENT_AMBULATORY_CARE_PROVIDER_SITE_OTHER): Payer: Medicare Other

## 2014-04-28 ENCOUNTER — Encounter: Payer: Self-pay | Admitting: Cardiology

## 2014-04-28 DIAGNOSIS — I5022 Chronic systolic (congestive) heart failure: Secondary | ICD-10-CM

## 2014-04-28 DIAGNOSIS — I4891 Unspecified atrial fibrillation: Secondary | ICD-10-CM

## 2014-04-28 LAB — CBC WITH DIFFERENTIAL/PLATELET
Basophils Absolute: 0 10*3/uL (ref 0.0–0.1)
Basophils Relative: 0.1 % (ref 0.0–3.0)
EOS PCT: 0.1 % (ref 0.0–5.0)
Eosinophils Absolute: 0 10*3/uL (ref 0.0–0.7)
HEMATOCRIT: 36.7 % — AB (ref 39.0–52.0)
Hemoglobin: 12.7 g/dL — ABNORMAL LOW (ref 13.0–17.0)
LYMPHS ABS: 2 10*3/uL (ref 0.7–4.0)
Lymphocytes Relative: 29.7 % (ref 12.0–46.0)
MCHC: 34.5 g/dL (ref 30.0–36.0)
MCV: 92.9 fl (ref 78.0–100.0)
MONOS PCT: 11.9 % (ref 3.0–12.0)
Monocytes Absolute: 0.8 10*3/uL (ref 0.1–1.0)
Neutro Abs: 3.9 10*3/uL (ref 1.4–7.7)
Neutrophils Relative %: 58.2 % (ref 43.0–77.0)
Platelets: 167 10*3/uL (ref 150.0–400.0)
RBC: 3.95 Mil/uL — ABNORMAL LOW (ref 4.22–5.81)
RDW: 15.1 % (ref 11.5–15.5)
WBC: 6.8 10*3/uL (ref 4.0–10.5)

## 2014-04-28 LAB — BASIC METABOLIC PANEL
BUN: 30 mg/dL — ABNORMAL HIGH (ref 6–23)
CALCIUM: 9 mg/dL (ref 8.4–10.5)
CO2: 28 meq/L (ref 19–32)
Chloride: 104 mEq/L (ref 96–112)
Creatinine, Ser: 1.9 mg/dL — ABNORMAL HIGH (ref 0.4–1.5)
GFR: 37 mL/min — ABNORMAL LOW (ref >60.00)
GLUCOSE: 105 mg/dL — AB (ref 70–99)
Potassium: 4 mEq/L (ref 3.5–5.1)
Sodium: 138 mEq/L (ref 135–145)

## 2014-04-28 LAB — PROTIME-INR
INR: 2.6 ratio — ABNORMAL HIGH (ref 0.8–1.0)
Prothrombin Time: 28.2 s — ABNORMAL HIGH (ref 9.6–13.1)

## 2014-05-04 ENCOUNTER — Encounter: Payer: Self-pay | Admitting: Cardiology

## 2014-05-04 ENCOUNTER — Encounter: Payer: Self-pay | Admitting: *Deleted

## 2014-05-04 ENCOUNTER — Telehealth: Payer: Self-pay | Admitting: *Deleted

## 2014-05-04 ENCOUNTER — Ambulatory Visit (INDEPENDENT_AMBULATORY_CARE_PROVIDER_SITE_OTHER): Payer: Medicare Other | Admitting: *Deleted

## 2014-05-04 VITALS — BP 114/72 | HR 128 | Ht 64.0 in | Wt 138.1 lb

## 2014-05-04 DIAGNOSIS — I498 Other specified cardiac arrhythmias: Secondary | ICD-10-CM

## 2014-05-04 DIAGNOSIS — I5022 Chronic systolic (congestive) heart failure: Secondary | ICD-10-CM

## 2014-05-04 MED ORDER — METOPROLOL SUCCINATE ER 25 MG PO TB24: 25.0000 mg | ORAL_TABLET | Freq: Every day | ORAL | Status: DC

## 2014-05-04 MED ORDER — AMIODARONE HCL 200 MG PO TABS: 200.0000 mg | ORAL_TABLET | Freq: Two times a day (BID) | ORAL | Status: DC

## 2014-05-04 NOTE — Patient Instructions (Addendum)
Your physician has recommended you make the following change in your medication:  1.) Increase Toprol XL to 25mg  daily                                                  2.) increase Pacerone to 200mg  twice a day  Follow-up visit with Dayna PA tomorrow at 3:45pm Follow-up with coumadin clinic tomorrow & lab

## 2014-05-04 NOTE — Progress Notes (Signed)
1.) Reason for visit: irregular rhythm  2.) Name of MD requesting visit: McLean  3.) H&P: history of paroxysmal atrial fib/flutter  4.) ROS related to problem: exertional dyspnea, elevated heart rate  5.) Assessment and plan per MD: Dr. Aundra Dubin advised increase Toprol XL to 25mg  daily, Increase Amiodarone to 200mg  twice a day                                                               And follow-up with Dayna tomorrow at 3:45pm  ? EP consult

## 2014-05-04 NOTE — Telephone Encounter (Signed)
Dr. Aundra Dubin spoke with daughter & pt will come in for precardioversion ekg today with follow-up appointment tomorrow with Dayna at 3:45pm. Daughter agrees & Lisbeth Renshaw is aware. Horton Chin RN

## 2014-05-04 NOTE — Telephone Encounter (Signed)
Patient seen in clinic for an EKG.

## 2014-05-04 NOTE — Telephone Encounter (Signed)
Daughter calls today b/c pt heartrate is 75. States it was 110 yesterday and racing She would like Dr. Aundra Dubin to know & what should they do?  Forwarded to Dr. Aundra Dubin for review  Horton Chin RN

## 2014-05-05 ENCOUNTER — Ambulatory Visit (INDEPENDENT_AMBULATORY_CARE_PROVIDER_SITE_OTHER): Payer: Medicare Other | Admitting: Physician Assistant

## 2014-05-05 ENCOUNTER — Other Ambulatory Visit (INDEPENDENT_AMBULATORY_CARE_PROVIDER_SITE_OTHER): Payer: Medicare Other

## 2014-05-05 ENCOUNTER — Ambulatory Visit (INDEPENDENT_AMBULATORY_CARE_PROVIDER_SITE_OTHER): Payer: Medicare Other | Admitting: Pharmacist

## 2014-05-05 ENCOUNTER — Encounter: Payer: Self-pay | Admitting: Physician Assistant

## 2014-05-05 VITALS — BP 104/62 | HR 68 | Ht 64.0 in | Wt 139.0 lb

## 2014-05-05 DIAGNOSIS — I498 Other specified cardiac arrhythmias: Secondary | ICD-10-CM

## 2014-05-05 DIAGNOSIS — Z5181 Encounter for therapeutic drug level monitoring: Secondary | ICD-10-CM

## 2014-05-05 DIAGNOSIS — I340 Nonrheumatic mitral (valve) insufficiency: Secondary | ICD-10-CM

## 2014-05-05 DIAGNOSIS — I5022 Chronic systolic (congestive) heart failure: Secondary | ICD-10-CM

## 2014-05-05 DIAGNOSIS — I480 Paroxysmal atrial fibrillation: Secondary | ICD-10-CM

## 2014-05-05 DIAGNOSIS — I2589 Other forms of chronic ischemic heart disease: Secondary | ICD-10-CM

## 2014-05-05 DIAGNOSIS — I4891 Unspecified atrial fibrillation: Secondary | ICD-10-CM

## 2014-05-05 DIAGNOSIS — I059 Rheumatic mitral valve disease, unspecified: Secondary | ICD-10-CM

## 2014-05-05 DIAGNOSIS — I471 Supraventricular tachycardia: Secondary | ICD-10-CM | POA: Insufficient documentation

## 2014-05-05 DIAGNOSIS — N183 Chronic kidney disease, stage 3 unspecified: Secondary | ICD-10-CM

## 2014-05-05 DIAGNOSIS — I255 Ischemic cardiomyopathy: Secondary | ICD-10-CM

## 2014-05-05 DIAGNOSIS — I2510 Atherosclerotic heart disease of native coronary artery without angina pectoris: Secondary | ICD-10-CM

## 2014-05-05 DIAGNOSIS — I4892 Unspecified atrial flutter: Secondary | ICD-10-CM | POA: Insufficient documentation

## 2014-05-05 LAB — BASIC METABOLIC PANEL
BUN: 25 mg/dL — AB (ref 6–23)
CO2: 25 mEq/L (ref 19–32)
CREATININE: 1.7 mg/dL — AB (ref 0.4–1.5)
Calcium: 8.6 mg/dL (ref 8.4–10.5)
Chloride: 103 mEq/L (ref 96–112)
GFR: 40.51 mL/min — AB (ref >60.00)
GLUCOSE: 127 mg/dL — AB (ref 70–99)
Potassium: 4.3 mEq/L (ref 3.5–5.1)
Sodium: 134 mEq/L — ABNORMAL LOW (ref 135–145)

## 2014-05-05 LAB — POCT INR: INR: 2.1

## 2014-05-05 NOTE — Patient Instructions (Signed)
Your physician has recommended you make the following change in your medication:  1) STOP Digoxin  Your physician recommends that you schedule a follow-up appointment on Monday 8/17 for a nurse visit EKG  Your physician recommends that you schedule a follow-up appointment next with with Dr.McLean  Or PA/NP on a day that Elkins is in the office

## 2014-05-05 NOTE — Progress Notes (Signed)
[facility] [address] [place], [place]  [address] Phone: [phone] Fax:  [phone]  Date:  05/05/2014   Patient ID:  Nathan, [name] 1925/11/07, MRN [id number]   PCP:  [name], MD  Cardiologist:  Dr. Aundra Dubin  History of Present Illness: [name] is a 80 y.o. male with history of CAD, presumed ischemic CMP, CKD stage III, and paroxysmal atrial fibrillation/flutter/atrial tach who presents for cardiology followup. He speaks little [place] and his daughter interprets. He has been having issues over the last year with atrial arrhythmias. At prior appointment in January he was noted to be in atypical atirla flutter versus atrial tach. Initially a rate control approach was chosen but the patient started to notice increased exertional dyspnea. He was cardioverted in 09/2013 and has been on amiodarone. At a followup in 03/2014 he was back in atrial tach. Dr. Aundra Dubin arranged for DCCV but he went back into NSR on his own. At last f/u on 04/26/14, BP and HR were running on the lower side thus Toprol was cut to 12.5mg  daily. His daughter sent a MyChart message since that time reporting increase in palpitations. He came in for an EKG and was felt to be in atypical atrial flutter with 122bpm. Dr. Aundra Dubin advised increase Toprol XL to 25mg  daily, increase Amiodarone to 200mg  twice a day. He comes in today feeling much better. No active complaints including CP, SOB. Weight stable. HR is better controlled - I reviewed EKG with Dr. Rayann Heman given unusual P-QRS relationship who felt this represented likely atrial tach with VA dissociation.  Labs from today showed Na 134, K 4.3, Cl 104, CO2 25, Glu 127, BUN 25, Cr 1.7 (improved from 1.9 on 8/5, stable from 8/3). Baseline Cr appears somewhere between 1.4-1.7.   Recent Labs: 08/19/2013: Pro B Natriuretic peptide (BNP) 126.0*  01/04/2014: HDL Cholesterol by NMR 62.40; LDL (calc) 92  03/23/2014: TSH 2.43  04/26/2014: ALT 45  04/28/2014: Hemoglobin  12.7*  05/05/2014: Creatinine 1.7*; Potassium 4.3   Wt Readings from Last 3 Encounters:  05/05/14 139 lb (63.05 kg)  05/04/14 138 lb 1.9 oz (62.651 kg)  04/26/14 139 lb (63.05 kg)     Past Medical History  Diagnosis Date  . CAD (coronary artery disease)     a. LHC 4/06 with 60% pLAD, 40% ramus, 60% mRCA, and 90% mPDA. No intervention. Inferior akinesis on LV-gram was suggestive of possible inferior MI with recanalization.  . Hyperlipidemia     a. Myalgias with Lipitor and Crestor.  . Hypothyroidism   . Peripheral vascular disease   . Anxiety   . Hx of colonic polyps   . Paroxysmal atrial fibrillation 02/2009     a. With RVR tx with amiodarone returning to NSR 02/2009. b. Had atypical aflutter vs atrial tach 4/14, converted to NSR 4/14. In atypical flutter vs atrial tach in 1/15, DCCV 1/15 to NSR. c. Atrial tach 7/15 with junctional escape 60s.  . Systolic CHF     a.  Probable ischemic cardiomyopathy. Echo (6/10) with EF 40%, mildly dilated LV, moderate MR. ;  b.  echo 3/12: mild LVH, EF 30-35%, IL HK and Lat HK, mod to severe MR, trivial AI, mod LAE  . PNA (pneumonia)     History of   . Mitral regurgitation     a. Moderate by echo 6/10. b. 2-3+ by cath 2006; mod to severe by echo 3/12  . PVC's (premature ventricular contractions)   . Cataract   . Heart  murmur   . CKD (chronic kidney disease), stage III   . Skin cancer     Hx of 2008 BCC  . Chronic indwelling Foley catheter     per family, bladder muscles no longer work  . Depression   . Myocardial infarction   . Basal cell carcinoma   . Paroxysmal atrial tachycardia     a. See details under PAF.  Marland Kitchen Paroxysmal atrial flutter     a. See details under PAF.  Marland Kitchen BPH (benign prostatic hyperplasia)   . Recurrent UTI   . Imbalance   . Carotid stenosis     a. Dopplers 2/15 - 40-59% BICA.    Current Outpatient Prescriptions  Medication Sig Dispense Refill  . amiodarone (PACERONE) 200 MG tablet Take 1 tablet (200 mg total) by  mouth 2 (two) times daily.  180 tablet  3   digoxin (LANOXIN) 0.125 MG tablet   Take 0.0625 mg by mouth every other day. 1/2 tablet every other day    . co-enzyme Q-10 30 MG capsule Take 90 mg by mouth daily.      . furosemide (LASIX) 40 MG tablet Take 20 mg by mouth daily.       Marland Kitchen levothyroxine (SYNTHROID, LEVOTHROID) 100 MCG tablet Take 100 mcg by mouth daily before breakfast.      . lisinopril (PRINIVIL,ZESTRIL) 5 MG tablet Take 1 tablet (5 mg total) by mouth daily.  90 tablet  3  . metoprolol succinate (TOPROL XL) 25 MG 24 hr tablet Take 1 tablet (25 mg total) by mouth daily.  30 tablet  3  . Multiple Vitamin (MULTIVITAMIN) tablet Take 1 tablet by mouth daily.      . Omega-3 Fatty Acids (FISH OIL) 1200 MG CAPS Take 1 capsule (1,200 mg total) by mouth daily.  90 capsule  3  . potassium chloride (K-DUR,KLOR-CON) 10 MEQ tablet Take 20 mEq by mouth daily.      . pravastatin (PRAVACHOL) 80 MG tablet Take 1 tablet (80 mg total) by mouth daily.  90 tablet  3  . tamsulosin (FLOMAX) 0.4 MG CAPS capsule Take 0.4 mg by mouth daily.      Marland Kitchen warfarin (COUMADIN) 1 MG tablet Take 1-2 mg by mouth daily. 1 pill (1mg ) everyday except 2 pills (2mg ) on  Mondays, Thursdays and Saturdays or as directed by coumadin clinic      . zolpidem (AMBIEN) 10 MG tablet Take 1 tablet (10 mg total) by mouth at bedtime as needed for sleep.  30 tablet  5   No current facility-administered medications for this visit.    Allergies:   Atenolol; Ezetimibe-simvastatin; and Rosuvastatin   Social History:  The patient  reports that he has never smoked. He does not have any smokeless tobacco history on file. He reports that he does not drink alcohol or use illicit drugs.   Family History:  The patient's family history includes Coronary artery disease in his other; Heart disease in his mother.   ROS:  Please see the history of present illness.    All other systems reviewed and negative.   PHYSICAL EXAM:  VS:  BP 104/62  Pulse 68   Ht 5\' 4"  (1.626 m)  Wt 139 lb (63.05 kg)  BMI 23.85 kg/m2 Well nourished, well developed, in no acute distress HEENT: normal Neck: no JVD Cardiac:  normal S1, S2; RRR; no murmur Lungs:  clear to auscultation bilaterally, no wheezing, rhonchi or rales Abd: soft, nontender, no hepatomegaly Ext: no edema Skin:  warm and dry Neuro:  moves all extremities spontaneously, no focal abnormalities noted  EKG: probable atrial tach with VA dissociation, rate 68bpm (discussed with Dr. Rayann Heman)     ASSESSMENT AND PLAN:  1. Paroxysmal atrial fib/atrial flutter/atrial tach - his rate has improved but he is now in probable atrial tach with VA dissociation. D/w Dr. Rayann Heman. He recommended to discontinue digoxin but continue amiodarone and Toprol at present doses for now. Monitor for symptoms. The patient's daughter was skeptical of changing his medicines without talking to Dr. Aundra Dubin who is on vacation. Since the patient is feeling better from symptom standpoint with rate control, will hold off on plan for cardioversion and see if he goes back in on his own. Will recheck EKG on Monday and have him come back in next week for an office visit. 2. CKD stage III - Cr improved from last week. Will await further input from Dr. Claris Gladden review. 3. CAD - no anginal sx. 4. Mitral regurgitation - managed conservatively.  Dispo: As above.  Signed, Melina Copa, PA-C  05/05/2014 5:22 PM

## 2014-05-06 ENCOUNTER — Other Ambulatory Visit: Payer: Self-pay | Admitting: *Deleted

## 2014-05-06 ENCOUNTER — Telehealth: Payer: Self-pay | Admitting: Cardiology

## 2014-05-06 MED ORDER — PACERONE 200 MG PO TABS: 200.0000 mg | ORAL_TABLET | Freq: Two times a day (BID) | ORAL | Status: DC

## 2014-05-10 ENCOUNTER — Ambulatory Visit (INDEPENDENT_AMBULATORY_CARE_PROVIDER_SITE_OTHER): Payer: Medicare Other | Admitting: *Deleted

## 2014-05-10 VITALS — BP 104/62 | HR 64 | Ht 64.0 in | Wt 141.5 lb

## 2014-05-10 DIAGNOSIS — I471 Supraventricular tachycardia: Secondary | ICD-10-CM

## 2014-05-10 DIAGNOSIS — I480 Paroxysmal atrial fibrillation: Secondary | ICD-10-CM

## 2014-05-10 DIAGNOSIS — I4891 Unspecified atrial fibrillation: Secondary | ICD-10-CM

## 2014-05-10 NOTE — Patient Instructions (Signed)
EKG reviewed with Dr Lovena Le.  Keep your medications the same, stay off digoxin.   Keep your appointment already scheduled with Dr Aundra Dubin 05/14/14.

## 2014-05-10 NOTE — Progress Notes (Signed)
1.) Reason for visit: EKG  2.) Name of MD requesting visit: Dayna Dunn,PA-C        3.)H&P: Paroxysmal atrial fib/atrial flutter/atrial tach - his rate has improved but he is now in probable atrial tach with VA dissociation. D/w Dr. Rayann Heman. He recommended to discontinue digoxin but continue amiodarone and Toprol at present doses for now. Monitor for symptoms. The patient's daughter was skeptical of changing his medicines without talking to Dr. Aundra Dubin who is on vacation. Since the patient is feeling better from symptom standpoint with rate control, will hold off on plan for cardioversion and see if he goes back in on his own. Will recheck EKG on Monday and have him come back in next week for an office visit  3.) ROS related to problem: pt without complaints except daughter states some anxiousness, but not out of the ordinary for pt.   4.) Assessment and plan per MD:                           EKG reviewed with Dr Harrington Challenger and Dr Lovena Le--             Dr Lovena Le did not recommend any changes at present time.             Keep appt scheduled with Dr Aundra Dubin 05/14/14.

## 2014-05-11 ENCOUNTER — Encounter: Payer: Self-pay | Admitting: Cardiology

## 2014-05-12 ENCOUNTER — Ambulatory Visit (INDEPENDENT_AMBULATORY_CARE_PROVIDER_SITE_OTHER): Payer: Medicare Other | Admitting: Cardiology

## 2014-05-12 ENCOUNTER — Encounter: Payer: Self-pay | Admitting: Cardiology

## 2014-05-12 VITALS — BP 110/60 | HR 66 | Ht 64.0 in | Wt 141.0 lb

## 2014-05-12 DIAGNOSIS — I498 Other specified cardiac arrhythmias: Secondary | ICD-10-CM

## 2014-05-12 DIAGNOSIS — I471 Supraventricular tachycardia: Secondary | ICD-10-CM

## 2014-05-12 DIAGNOSIS — I5022 Chronic systolic (congestive) heart failure: Secondary | ICD-10-CM

## 2014-05-12 DIAGNOSIS — I2510 Atherosclerotic heart disease of native coronary artery without angina pectoris: Secondary | ICD-10-CM

## 2014-05-12 LAB — BASIC METABOLIC PANEL
BUN: 24 mg/dL — AB (ref 6–23)
CALCIUM: 8.6 mg/dL (ref 8.4–10.5)
CO2: 23 mEq/L (ref 19–32)
CREATININE: 1.6 mg/dL — AB (ref 0.4–1.5)
Chloride: 104 mEq/L (ref 96–112)
GFR: 43.74 mL/min — AB (ref >60.00)
Glucose, Bld: 97 mg/dL (ref 70–99)
Potassium: 5 mEq/L (ref 3.5–5.1)
Sodium: 134 mEq/L — ABNORMAL LOW (ref 135–145)

## 2014-05-12 NOTE — Patient Instructions (Addendum)
Your physician recommends that you return for lab work today--BMET/Digoxin level.   Decrease amiodarone to 200mg  daily.  Your physician recommends that you schedule a follow-up appointment in: 1 month with Dr Aundra Dubin in the [facility] at [facility]. Please schedule the appointment on a day Dr Aundra Dubin is scheduled to be there.

## 2014-05-13 LAB — DIGOXIN LEVEL: Digoxin Level: 0.2 ng/mL — ABNORMAL LOW (ref 0.8–2.0)

## 2014-05-13 NOTE — Progress Notes (Signed)
Patient ID: [name], male   DOB: 12-19-1925, 80 y.o.   MRN: [id number] PCP: Dr. Joseph Art  80 yo with history of CAD, ischemic CMP, CKD, and paroxysmal atrial fibrillation and atrial tachycardia presents for cardiology followup. He speaks little [place] and his daughter interprets.  No chest pain.  Patient has a history of myalgias with Crestor and Lipitor.  He has tolerated pravastatin without myalgias.  At a prior appointment, he was noted to be back in atypical atrial flutter versus atrial tachycardia.  It was rate-controlled and initially I thought to treat him with a rate control/anticoagulation strategy.  However, he started to notice increased exertional dyspnea.  Therefore, I took him for cardioversion back to NSR, in 1/15.  He has been on amiodarone.  Recently, he has been back in atrial tachycardia again.  At last appointment with PA, he was in atrial tachycardia with VA dissociation.  Digoxin was stopped.  Today, he remains in atrial tachycardia with VA dissociation (but occasionally atrial activity is conducted).  He has a high junctional escape with rate in 60s.  He feels fine.  However, he is very anxious about his heart rhythm and checks his pulse several times a day. He walks about 1 mile most days without exertional dyspnea.  No chest pain.  Weight is stable.  He feels like his balance is off and walks on his wife's arm most of the time.  No falls.  No lightheadedness or syncope.    Labs (9/11): K 4.1, creatinine 1.4  Labs (8/12): K 3.8, creatinine 1.3, TSH normal, LFTs normal, LDL 96, HDL 50 Labs (1/13): K 3.9, creatinine 1.3, TSH normal, LFTs normal Labs (2/13): K 3.8, creatinine 1.3 Labs (5/13): K 3.5, creatinine 1.4, BNP 331, LFTs normal, TSH normal Labs (9/13): LDL 85, HDL 53, BNP 547, LFTs normal Labs (12/13): K 4.3, creatinine 1.49 => 1.4, digoxin 1.4, TSH normal, LFTs normal Labs (1/14): digoxin 1.0, K 4.3, creatinine 1.5 Labs (3/14): free T4 normal Labs (4/14): K 5=>  3.8, creatinine 1.8 => 1.4, digoxin 0.6 Labs (5/14): TSH normal, digoxin level normal Labs (8/14): K 4.2, creatinine 1.66, AST 42, ALT 37 Labs (9/14): K 4, creatinine 1.6, digoxin 1.3, LDL 104, HDL 51, AST 41, ALT 30 Labs (11/14): K 3.6, creatinine 1.5, BNP 126 Labs (12/14): TSH 6.93 Labs (2/15): K 3.7, creatinine 1.4, TSH normal, AST 59, ALT 56 Labs (3/15): TSH low, AST 57, ALT 30, digoxin 1.2 Labs (4/15): K 4, creatinine 1.4, LFTs normal Labs (6/15): TSH normal, digoxin level 0. Labs (8/15): K 4.3, creatinine 1.7  ECG: Atrial tachycardia with VA dissociation, occasional conducted atrial activity, V rate 66 with high junctional escape.   Allergies (verified):  1) ! Crestor  2) Vytorin  3) Atenolol   Past Medical History:  1. Coronary artery disease: LHC 4/06 with 60% pLAD, 40% ramus, 60% mRCA, and 90% mPDA. No intervention. Inferior akinesis on LV-gram was suggestive of possible inferior MI with recanalization.  2. Hyperlipidemia: Myalgias with Lipitor and Crestor.  3. Hypothyroidism  4. Peripheral vascular disease  5. Anxiety  6. Skin cancer, hx of 2008 BCC  7. Colonic polyps, hx of  8. Atrial arrhythmias (PAF and atrial tachycardia): Paroxysmal atrial fibrillation with rapid ventricular response treated with amiodarone therapy converting to normal sinus rhythm 02/2009.  He has also had atypical atrial flutter versus atrial tachycardia noted in 4/14, cardioverted to NSR in 4/14. In atypical flutter versus atrial tachycardia again in 1/15, DCCV in 1/15 to NSR. Atrial  tachycardia in 7/15 and 8/15 with junctional escape in 60s.  9. Systolic CHF: Probable ischemic cardiomyopathy. Echo (6/10) with EF 40%, mildly dilated LV, moderate MR.  Echo (3/12): EF 30-35%, inferolateral severe hypokinesis, trivial AI, moderate to severe MR.  10. History of PNA  11. CKD. Baseline creatinine around 1.4.  12. Mitral regurgitation: Moderate by echo 6/10. 2-3+ by cath 2006.  Moderate-severe by echo in  3/12.  13. PVCs  14. BPH 15. Recurrent UTIs 16. Imbalance 17. Carotid stenosis: Dopplers (2/15) with 40-59% BICA stenosis.   Family History:  Family History of CAD Male 1st degree relative <60   Social History:  Retired  Originally from [place], speaks little [place]  Married  Former Smoker   ROS: All systems reviewed and negative except as per HPI.   Current Outpatient Prescriptions  Medication Sig Dispense Refill  . amiodarone (PACERONE) 200 MG tablet One tablet daily      . co-enzyme Q-10 30 MG capsule Take 90 mg by mouth daily.      . furosemide (LASIX) 40 MG tablet Take 20 mg by mouth daily.       Marland Kitchen levothyroxine (SYNTHROID, LEVOTHROID) 100 MCG tablet Take 100 mcg by mouth daily before breakfast.      . lisinopril (PRINIVIL,ZESTRIL) 5 MG tablet Take 1 tablet (5 mg total) by mouth daily.  90 tablet  3  . metoprolol succinate (TOPROL XL) 25 MG 24 hr tablet Take 1 tablet (25 mg total) by mouth daily.  30 tablet  3  . Multiple Vitamin (MULTIVITAMIN) tablet Take 1 tablet by mouth daily.      . Omega-3 Fatty Acids (FISH OIL) 1200 MG CAPS Take 1 capsule (1,200 mg total) by mouth daily.  90 capsule  3  . potassium chloride (K-DUR,KLOR-CON) 10 MEQ tablet Take 20 mEq by mouth daily.      . pravastatin (PRAVACHOL) 80 MG tablet Take 1 tablet (80 mg total) by mouth daily.  90 tablet  3  . tamsulosin (FLOMAX) 0.4 MG CAPS capsule Take 0.4 mg by mouth daily.      Marland Kitchen warfarin (COUMADIN) 1 MG tablet Take 1-2 mg by mouth daily. 1 pill (1mg ) everyday except 2 pills (2mg ) on  Mondays, Thursdays and Saturdays or as directed by coumadin clinic      . zolpidem (AMBIEN) 10 MG tablet Take 1 tablet (10 mg total) by mouth at bedtime as needed for sleep.  30 tablet  5   No current facility-administered medications for this visit.    BP 110/60  Pulse 66  Ht 5\' 4"  (1.626 m)  Wt 63.957 kg (141 lb)  BMI 24.19 kg/m2 General: NAD Neck: No JVD; no thyromegaly or thyroid nodule.  Lungs: CTAB  CV:  Nondisplaced PMI.  Heart regular S1/S2, no S3/S4, 1/6 HSM at apex.  No edema.  Right carotid bruit. Abdomen: Soft, nontender, no hepatosplenomegaly, no distention.  Neurologic: Alert and oriented x 3.  Psych: Normal affect. Extremities: No clubbing or cyanosis.   Assessment/Plan: 1. Atrial arrhythmias  Atrial tachycardia with VA dissociation and high junctional escape, rate in 60s.  He is now off digoxin.  He is asymptomatic.  Discussed with EP and will just watch for now.  He knows to call with lightheadedness, syncope, etc.  He can decrease amiodarone to 200 mg once daily and continue Toprol XL.  Will follow LFTs and TSH on amiodarone.   2. HYPERLIPIDEMIA  He is tolerating pravastatin without myalgias. Had myalgias with Lipitor and Crestor.  LDL not ideal, but will continue current pravastatin as he tolerates it.   3. MITRAL INSUFFICIENCY Last echo showed moderate to severe MR. Given age and medical condition, would plan medical management.  4. SYSTOLIC HEART FAILURE, CHRONIC  EF 30-35% on last echo. Breathing is stable, NYHA class II symptoms.  He does not appear volume overloaded on exam.  - Now off digoxin but no symptomatic worsening.   - Continue current doses of Lasix, Toprol XL, and lisinopril.  5. Carotid stenosis: Moderate bilateral stenosis, repeat study in 2/16.  6. CKD: Creatinine has been higher, I have cut back on Lasix.  I will repeat BMET today.   Followup in 1 month in CHF clinic.   Loralie Champagne 05/13/2014

## 2014-05-14 ENCOUNTER — Ambulatory Visit: Payer: Medicare Other | Admitting: Cardiology

## 2014-05-19 ENCOUNTER — Ambulatory Visit (INDEPENDENT_AMBULATORY_CARE_PROVIDER_SITE_OTHER): Payer: Medicare Other | Admitting: *Deleted

## 2014-05-19 DIAGNOSIS — I4891 Unspecified atrial fibrillation: Secondary | ICD-10-CM

## 2014-05-19 DIAGNOSIS — Z5181 Encounter for therapeutic drug level monitoring: Secondary | ICD-10-CM

## 2014-05-19 LAB — POCT INR: INR: 2.4

## 2014-05-21 ENCOUNTER — Other Ambulatory Visit: Payer: Self-pay | Admitting: Family Medicine

## 2014-05-21 ENCOUNTER — Ambulatory Visit (INDEPENDENT_AMBULATORY_CARE_PROVIDER_SITE_OTHER): Payer: Medicare Other

## 2014-05-21 ENCOUNTER — Ambulatory Visit (INDEPENDENT_AMBULATORY_CARE_PROVIDER_SITE_OTHER): Payer: Medicare Other | Admitting: Internal Medicine

## 2014-05-21 VITALS — BP 122/86 | HR 129 | Temp 97.7°F | Resp 18 | Ht 64.0 in | Wt 141.0 lb

## 2014-05-21 DIAGNOSIS — R0789 Other chest pain: Secondary | ICD-10-CM

## 2014-05-21 DIAGNOSIS — R509 Fever, unspecified: Secondary | ICD-10-CM

## 2014-05-21 DIAGNOSIS — R8281 Pyuria: Secondary | ICD-10-CM

## 2014-05-21 DIAGNOSIS — R002 Palpitations: Secondary | ICD-10-CM

## 2014-05-21 DIAGNOSIS — I2510 Atherosclerotic heart disease of native coronary artery without angina pectoris: Secondary | ICD-10-CM

## 2014-05-21 LAB — POCT URINALYSIS DIPSTICK
Bilirubin, UA: NEGATIVE
Glucose, UA: NEGATIVE
Ketones, UA: NEGATIVE
NITRITE UA: POSITIVE
PH UA: 5
PROTEIN UA: NEGATIVE
Spec Grav, UA: 1.025
UROBILINOGEN UA: 0.2

## 2014-05-21 MED ORDER — CIPROFLOXACIN HCL 500 MG PO TABS: 500.0000 mg | ORAL_TABLET | Freq: Two times a day (BID) | ORAL | Status: DC

## 2014-05-21 NOTE — Progress Notes (Signed)
Subjective:    Patient ID: [name], male    DOB: [birth year], 80 y.o.   MRN: [id number]  Chief Complaint  Patient presents with  . Elevated pulse    pulse noticed just today--noted it when came in the office  . Fever    pt stated earlier today had a mild fever of 99.8   daughter translating  49yo with chronic fib flutter followed by Dr Aundra Dubin Has noted pulse in 140s at times over last 24 hrs  Fever  Pertinent negatives include no abdominal pain, chest pain, coughing or sore throat.  c/o not feeling well with rapid ht rate/ pressure and crackling in chest, fever?, sl decrease activity level over last 36h  Patient Active Problem List--most pertinent   Diagnosis Date Noted  . Paroxysmal atrial tachycardia   . Paroxysmal atrial flutter/Paroxysmal atrial fibrillation   . CKD (chronic kidney disease), stage III   . BPH with urinary obstruction 05/14/2011  . HYPERTENSION, BENIGN 08/04/2009  . MITRAL INSUFFICIENCY--echo 2010 04/04/2009  . SYSTOLIC HEART FAILURE, CHRONIC 04/04/2009  . BPH_Catheter 02/22/2009   Medications include lisinopril 5 mg Amiodarone 200 mg(incr 1 week ago) Metoprolol 25 mg--(decr to 1/2 daily 04/26/14) Coumadin 1-2 mg daily Lasix 20 mg daily--recent reduction 05/12/14 from 40mg    Review of Systems  Constitutional: Positive for fever. Negative for diaphoresis, activity change and appetite change.  HENT: Negative for sore throat.   Respiratory: Negative for cough.   Cardiovascular: Negative for chest pain and leg swelling.  Gastrointestinal: Negative for abdominal pain.  Genitourinary: Negative for dysuria.  Psychiatric/Behavioral: Negative for confusion.       Objective:   Physical Exam  BP 122/86  Pulse 129  Temp(Src) 97.7 F (36.5 C) (Oral)  Resp 18  Ht 5\' 4"  (1.626 m)  Wt 141 lb (63.957 kg)  BMI 24.19 kg/m2  SpO2 97%  Wt Readings from Last 3 Encounters:  05/21/14 141 lb (63.957 kg)  05/12/14 141 lb (63.957 kg)  05/10/14 141 lb 8  oz (64.184 kg)   he is alert and oriented and in no acute distress PERRLA/EOMs conjugate No carotid bruits Heart is irregularly irregular with a rate in the high 90s There is a soft flow murmur at the apex The lungs reveal crackling on inspiration at both bases posteriorly No wheezing with forced expiration  ECG 8/19- Dr Aundra Dubin: Atrial tachycardia with VA dissociation, occasional conducted atrial activity, V rate 66 with high junctional escape. ////EKG today=same with occas PVC  [facility] reading (PRIMARY) by  Dr. Laney Pastor prominent breast shadows/? Ill-defined circular density near the right hilum Mild increased markings bilaterally compared to one year ago with blunting of calyces versus chronic scarring  Results for orders placed in visit on 05/21/14  POCT URINALYSIS DIPSTICK      Result Value Ref Range   Color, UA yellow     Clarity, UA cloudy     Glucose, UA neg     Bilirubin, UA neg     Ketones, UA neg     Spec Grav, UA 1.025     Blood, UA small     pH, UA 5.0     Protein, UA neg     Urobilinogen, UA 0.2     Nitrite, UA pos     Leukocytes, UA large (3+)     consistent with chronic catheter/we'll culture//treat only if febrile     Assessment & Plan:  Palpitations - Plan: POCT urinalysis dipstick, DG Chest 2 View, EKG 12-Lead, CANCELED: POCT  UA - Microscopic Only  Chest tightness - Plan: POCT urinalysis dipstick, DG Chest 2 View, EKG 12-Lead, CANCELED: POCT UA - Microscopic Only  Fever, unspecified fever cause - Plan: POCT urinalysis dipstick, DG Chest 2 View, EKG 12-Lead, CANCELED: POCT UA - Microscopic Only  No orders of the defined types were placed in this encounter.   Because he appears nontoxic and fever has resolved today, the prudent approach is to reinstate his full diuretic meds thinking this is an early exacerbation of his heart failure and if he develops fever he can return for consideration of antibiotics and further investigation to see if pneumonia is  developing

## 2014-05-22 ENCOUNTER — Encounter: Payer: Self-pay | Admitting: Cardiology

## 2014-05-23 ENCOUNTER — Ambulatory Visit (INDEPENDENT_AMBULATORY_CARE_PROVIDER_SITE_OTHER): Payer: Medicare Other | Admitting: Family Medicine

## 2014-05-23 ENCOUNTER — Ambulatory Visit (INDEPENDENT_AMBULATORY_CARE_PROVIDER_SITE_OTHER): Payer: Medicare Other

## 2014-05-23 VITALS — BP 98/58 | HR 62 | Temp 97.8°F | Resp 14 | Ht 64.0 in | Wt 143.0 lb

## 2014-05-23 DIAGNOSIS — I2510 Atherosclerotic heart disease of native coronary artery without angina pectoris: Secondary | ICD-10-CM

## 2014-05-23 DIAGNOSIS — R82998 Other abnormal findings in urine: Secondary | ICD-10-CM

## 2014-05-23 DIAGNOSIS — R8281 Pyuria: Secondary | ICD-10-CM

## 2014-05-23 DIAGNOSIS — R05 Cough: Secondary | ICD-10-CM

## 2014-05-23 DIAGNOSIS — R059 Cough, unspecified: Secondary | ICD-10-CM

## 2014-05-23 LAB — POCT UA - MICROSCOPIC ONLY
Casts, Ur, LPF, POC: NEGATIVE
Crystals, Ur, HPF, POC: NEGATIVE
Yeast, UA: NEGATIVE

## 2014-05-23 LAB — POCT URINALYSIS DIPSTICK
Bilirubin, UA: NEGATIVE
Blood, UA: NEGATIVE
Glucose, UA: NEGATIVE
Ketones, UA: NEGATIVE
Leukocytes, UA: NEGATIVE
Nitrite, UA: NEGATIVE
Protein, UA: NEGATIVE
Spec Grav, UA: 1.015
Urobilinogen, UA: 0.2
pH, UA: 5

## 2014-05-23 LAB — POCT CBC
Granulocyte percent: 73.8 %G (ref 37–80)
HCT, POC: 36 % — AB (ref 43.5–53.7)
Hemoglobin: 11.8 g/dL — AB (ref 14.1–18.1)
Lymph, poc: 1.8 (ref 0.6–3.4)
MCH, POC: 30.8 pg (ref 27–31.2)
MCHC: 32.8 g/dL (ref 31.8–35.4)
MCV: 94 fL (ref 80–97)
MID (cbc): 0.7 (ref 0–0.9)
MPV: 7.3 fL (ref 0–99.8)
POC Granulocyte: 7.2 — AB (ref 2–6.9)
POC LYMPH PERCENT: 18.7 %L (ref 10–50)
POC MID %: 7.5 %M (ref 0–12)
Platelet Count, POC: 159 10*3/uL (ref 142–424)
RBC: 3.83 M/uL — AB (ref 4.69–6.13)
RDW, POC: 15.9 %
WBC: 9.7 10*3/uL (ref 4.6–10.2)

## 2014-05-23 LAB — COMPREHENSIVE METABOLIC PANEL
ALT: 70 U/L — ABNORMAL HIGH (ref 0–53)
AST: 75 U/L — ABNORMAL HIGH (ref 0–37)
Albumin: 3.8 g/dL (ref 3.5–5.2)
Alkaline Phosphatase: 46 U/L (ref 39–117)
BUN: 25 mg/dL — ABNORMAL HIGH (ref 6–23)
CO2: 24 mEq/L (ref 19–32)
Calcium: 8.6 mg/dL (ref 8.4–10.5)
Chloride: 103 mEq/L (ref 96–112)
Creat: 1.63 mg/dL — ABNORMAL HIGH (ref 0.50–1.35)
Glucose, Bld: 104 mg/dL — ABNORMAL HIGH (ref 70–99)
Potassium: 4.4 mEq/L (ref 3.5–5.3)
Sodium: 137 mEq/L (ref 135–145)
Total Bilirubin: 0.9 mg/dL (ref 0.2–1.2)
Total Protein: 7 g/dL (ref 6.0–8.3)

## 2014-05-23 NOTE — Progress Notes (Signed)
Subjective:  This chart was scribed for Nathan Haber, MD by [name], [name] Scribe. This patient was seen in room 8 and the patient's care was started 10:41 AM.    Patient ID: [name], male    DOB: 1926-02-12, 80 y.o.   MRN: [id number]  Chief Complaint  Patient presents with   Follow-up    HPI HPI Comments: [name] is a 80 y.o. male who presents to [facility] here for follow-up today. Pt was seen 8/28 for a 1 day history of rapid heart rate/pressure, and crackling in chest. Activity level decreased since time of onset of symptoms. Pt denies any ongoing fever today since last visit, but reports mild cough and SOB onset yesterday. No other associated symptoms at this time. Pt has a history of chronic fib flutter followed by Dr. Loralie Champagne at [facility]. Pt with known allergies to atenolol, ezetimibe-simvastatin, and rosuvastatin. No other concerns this visit.  Patient Active Problem List   Diagnosis Date Noted   Paroxysmal atrial tachycardia    Paroxysmal atrial flutter    CKD (chronic kidney disease), stage III    Encounter for therapeutic drug monitoring 11/03/2013   Eyelid cyst 07/09/2012   Insomnia 11/18/2011   BPH with urinary obstruction 05/14/2011   HYPERTENSION, BENIGN 08/04/2009   MITRAL INSUFFICIENCY 62/69/4854   SYSTOLIC HEART FAILURE, CHRONIC 04/04/2009   Paroxysmal atrial fibrillation 02/22/2009   Unsteady gait 10/13/2008   INSOMNIA, PERSISTENT 02/11/2008   HYPOTHYROIDISM 07/21/2007   HYPERLIPIDEMIA 07/21/2007   ANXIETY 07/21/2007   CORONARY ARTERY DISEASE 07/21/2007   PERIPHERAL VASCULAR DISEASE 07/21/2007   SKIN CANCER, HX OF 07/21/2007   COLONIC POLYPS, HX OF 07/21/2007   Past Medical History  Diagnosis Date   CAD (coronary artery disease)     a. LHC 4/06 with 60% pLAD, 40% ramus, 60% mRCA, and 90% mPDA. No intervention. Inferior akinesis on LV-gram was suggestive  of possible inferior MI with recanalization.   Hyperlipidemia     a. Myalgias with Lipitor and Crestor.   Hypothyroidism    Peripheral vascular disease    Anxiety    Hx of colonic polyps    Paroxysmal atrial fibrillation 02/2009     a. With RVR tx with amiodarone returning to NSR 02/2009. b. Had atypical aflutter vs atrial tach 4/14, converted to NSR 4/14. In atypical flutter vs atrial tach in 1/15, DCCV 1/15 to NSR. c. Atrial tach 7/15 with junctional escape 60s.   Systolic CHF     a.  Probable ischemic cardiomyopathy. Echo (6/10) with EF 40%, mildly dilated LV, moderate MR. ;  b.  echo 3/12: mild LVH, EF 30-35%, IL HK and Lat HK, mod to severe MR, trivial AI, mod LAE   PNA (pneumonia)     History of    Mitral regurgitation     a. Moderate by echo 6/10. b. 2-3+ by cath 2006; mod to severe by echo 3/12   PVC's (premature ventricular contractions)    Cataract    Heart murmur    CKD (chronic kidney disease), stage III    Skin cancer     Hx of 2008 BCC   Chronic indwelling Foley catheter     per family, bladder muscles no longer work   Depression    Myocardial infarction    Basal cell carcinoma    Paroxysmal atrial tachycardia     a. See details under PAF.   Paroxysmal atrial flutter  a. See details under PAF.   BPH (benign prostatic hyperplasia)    Recurrent UTI    Imbalance    Carotid stenosis     a. Dopplers 2/15 - 40-59% BICA.   Past Surgical History  Procedure Laterality Date   Skin cancer excision  2008    shoulder and nose    Cardioversion N/A 01/02/2013    Procedure: CARDIOVERSION;  Surgeon: Larey Dresser, MD;  Location: [place];  Service: Cardiovascular;  Laterality: N/A;   Cardioversion N/A 10/23/2013    Procedure: CARDIOVERSION;  Surgeon: Larey Dresser, MD;  Location: [facility] ENDOSCOPY;  Service: Cardiovascular;  Laterality: N/A;   Allergies  Allergen Reactions   Atenolol     REACTION: brady   Ezetimibe-Simvastatin      REACTION: spasms   Rosuvastatin    Prior to Admission medications   Medication Sig Start Date End Date Taking? Authorizing Provider  amiodarone (PACERONE) 200 MG tablet 100 mg. One tablet daily 05/22/14  Yes Larey Dresser, MD  ciprofloxacin (CIPRO) 500 MG tablet Take 1 tablet (500 mg total) by mouth 2 (two) times daily. 05/21/14  Yes Nathan Haber, MD  co-enzyme Q-10 30 MG capsule Take 90 mg by mouth daily. 08/27/13  Yes Nathan Haber, MD  furosemide (LASIX) 40 MG tablet Take 40 mg by mouth daily.  05/21/14  Yes Larey Dresser, MD  levothyroxine (SYNTHROID, LEVOTHROID) 100 MCG tablet Take 100 mcg by mouth daily before breakfast. 02/04/14  Yes Larey Dresser, MD  lisinopril (PRINIVIL,ZESTRIL) 5 MG tablet Take 1 tablet (5 mg total) by mouth daily. 05/20/13  Yes Larey Dresser, MD  metoprolol succinate (TOPROL XL) 25 MG 24 hr tablet Take 1 tablet (25 mg total) by mouth daily. 05/04/14  Yes Larey Dresser, MD  Multiple Vitamin (MULTIVITAMIN) tablet Take 1 tablet by mouth daily.   Yes Historical Provider, MD  Omega-3 Fatty Acids (FISH OIL) 1200 MG CAPS Take 1 capsule (1,200 mg total) by mouth daily. 08/27/13  Yes Nathan Haber, MD  potassium chloride (K-DUR,KLOR-CON) 10 MEQ tablet Take 20 mEq by mouth daily.   Yes Historical Provider, MD  pravastatin (PRAVACHOL) 80 MG tablet Take 1 tablet (80 mg total) by mouth daily. 01/06/14  Yes Larey Dresser, MD  tamsulosin (FLOMAX) 0.4 MG CAPS capsule Take 0.4 mg by mouth daily. 12/24/13  Yes Nathan Haber, MD  warfarin (COUMADIN) 1 MG tablet Take 1-2 mg by mouth daily. 1 pill (1mg ) everyday except 2 pills (2mg ) on  Mondays, Thursdays and Saturdays or as directed by coumadin clinic 12/28/13  Yes Larey Dresser, MD  zolpidem (AMBIEN) 10 MG tablet Take 1 tablet (10 mg total) by mouth at bedtime as needed for sleep. 02/08/14  Yes Nathan Haber, MD    Review of Systems  Constitutional: Positive for activity change. Negative for fever and chills.  Respiratory:  Positive for cough and shortness of breath.     Triage Vitals: BP 98/58   Pulse 62   Temp(Src) 97.8 F (36.6 C) (Oral)   Resp 14   Ht 5\' 4"  (1.626 m)   Wt 143 lb (64.864 kg)   BMI 24.53 kg/m2   SpO2 98%  Objective:  Physical Exam  Nursing note and vitals reviewed. Constitutional: He is oriented to person, place, and time. He appears well-developed and well-nourished.  HENT:  Head: Normocephalic.  Eyes: EOM are normal.  Neck: Normal range of motion.  Cardiovascular: Normal rate and regular rhythm.   Murmur heard. 1/6 systolic  murmur  Pulmonary/Chest: Effort normal. He has rales.  Bilateral dry rales  Abdominal: He exhibits no distension.  Musculoskeletal: Normal range of motion.  Neurological: He is alert and oriented to person, place, and time.  Psychiatric: He has a normal mood and affect.   Results for orders placed in visit on 05/23/14  POCT URINALYSIS DIPSTICK      Result Value Ref Range   Color, UA yellow     Clarity, UA clear     Glucose, UA neg     Bilirubin, UA neg     Ketones, UA neg     Spec Grav, UA 1.015     Blood, UA neg     pH, UA 5.0     Protein, UA neg     Urobilinogen, UA 0.2     Nitrite, UA neg     Leukocytes, UA Negative    POCT UA - MICROSCOPIC ONLY      Result Value Ref Range   WBC, Ur, HPF, POC 1-3     RBC, urine, microscopic 0-1     Bacteria, U Microscopic 1+     Mucus, UA trace     Epithelial cells, urine per micros 1-3     Crystals, Ur, HPF, POC neg     Casts, Ur, LPF, POC neg     Yeast, UA neg     Amorphous, UA 1+    POCT CBC      Result Value Ref Range   WBC 9.7  4.6 - 10.2 K/uL   Lymph, poc 1.8  0.6 - 3.4   POC LYMPH PERCENT 18.7  10 - 50 %L   MID (cbc) 0.7  0 - 0.9   POC MID % 7.5  0 - 12 %M   POC Granulocyte 7.2 (*) 2 - 6.9   Granulocyte percent 73.8  37 - 80 %G   RBC 3.83 (*) 4.69 - 6.13 M/uL   Hemoglobin 11.8 (*) 14.1 - 18.1 g/dL   HCT, POC 36.0 (*) 43.5 - 53.7 %   MCV 94.0  80 - 97 fL   MCH, POC 30.8  27 - 31.2 pg   MCHC  32.8  31.8 - 35.4 g/dL   RDW, POC 15.9     Platelet Count, POC 159  142 - 424 K/uL   MPV 7.3  0 - 99.8 fL   [facility] reading (PRIMARY) by  Dr. Joseph Art:  CXR:  Resolution of left lung density, blunted costophrenic angles, increased AP diameter.     Assessment & Plan:  I personally performed the services described in this documentation, which was scribed in my presence. The recorded information has been reviewed and is accurate.    Py[place] - Plan: POCT urinalysis dipstick, POCT UA - Microscopic Only, DG Chest 2 View, Comprehensive metabolic panel, POCT CBC, CANCELED: CBC  Cough  Signed, Nathan Haber, MD

## 2014-05-24 ENCOUNTER — Encounter: Payer: Self-pay | Admitting: Cardiology

## 2014-05-24 ENCOUNTER — Other Ambulatory Visit: Payer: Self-pay | Admitting: Cardiology

## 2014-05-24 ENCOUNTER — Ambulatory Visit (INDEPENDENT_AMBULATORY_CARE_PROVIDER_SITE_OTHER): Payer: Medicare Other | Admitting: *Deleted

## 2014-05-24 ENCOUNTER — Ambulatory Visit (INDEPENDENT_AMBULATORY_CARE_PROVIDER_SITE_OTHER): Payer: Medicare Other | Admitting: Cardiology

## 2014-05-24 VITALS — BP 90/56 | HR 117 | Ht 64.0 in | Wt 142.0 lb

## 2014-05-24 DIAGNOSIS — N183 Chronic kidney disease, stage 3 unspecified: Secondary | ICD-10-CM

## 2014-05-24 DIAGNOSIS — I4891 Unspecified atrial fibrillation: Secondary | ICD-10-CM

## 2014-05-24 DIAGNOSIS — I080 Rheumatic disorders of both mitral and aortic valves: Secondary | ICD-10-CM

## 2014-05-24 DIAGNOSIS — I5022 Chronic systolic (congestive) heart failure: Secondary | ICD-10-CM

## 2014-05-24 DIAGNOSIS — I480 Paroxysmal atrial fibrillation: Secondary | ICD-10-CM

## 2014-05-24 DIAGNOSIS — I2510 Atherosclerotic heart disease of native coronary artery without angina pectoris: Secondary | ICD-10-CM

## 2014-05-24 DIAGNOSIS — Z5181 Encounter for therapeutic drug level monitoring: Secondary | ICD-10-CM

## 2014-05-24 DIAGNOSIS — I498 Other specified cardiac arrhythmias: Secondary | ICD-10-CM

## 2014-05-24 DIAGNOSIS — I471 Supraventricular tachycardia: Secondary | ICD-10-CM

## 2014-05-24 LAB — POCT INR: INR: 2

## 2014-05-24 MED ORDER — LISINOPRIL 2.5 MG PO TABS: 2.5000 mg | ORAL_TABLET | Freq: Every day | ORAL | Status: DC

## 2014-05-24 MED ORDER — AMIODARONE HCL 200 MG PO TABS
ORAL_TABLET | ORAL | Status: DC
Start: 2014-05-24 — End: 2014-06-01

## 2014-05-24 NOTE — Patient Instructions (Signed)
Decrease lisinopril to 2.5mg  daily. You can take 1/2 of a 5mg  tablet and use your current supply.   Increase amiodarone to 200mg  two times a day for 5 days, then decrease to amiodarone to 1 tablet (200mg ) daily. Stay on 1 tablet daily.   Your physician recommends that you schedule a follow-up appointment in: 1 week with Dr Aundra Dubin in the [facility].

## 2014-05-24 NOTE — Progress Notes (Signed)
Patient ID: [name], male   DOB: 18-Nov-1925, 80 y.o.   MRN: [id number] PCP: Dr. Joseph Art  80 yo with history of CAD, ischemic CMP, CKD, and paroxysmal atrial fibrillation and atrial tachycardia presents for cardiology followup. He speaks little [place] and his daughter interprets.  No chest pain.  Patient has a history of myalgias with Crestor and Lipitor.  He has tolerated pravastatin without myalgias.  At a prior appointment, he was noted to be back in atypical atrial flutter versus atrial tachycardia.  It was rate-controlled and initially I thought to treat him with a rate control/anticoagulation strategy.  However, he started to notice increased exertional dyspnea.  Therefore, I took him for cardioversion back to NSR, in 1/15.  He has been on amiodarone.  Recently, he has been back in atrial tachycardia again.  At a prior appointment with the PA, he was in atrial tachycardia with VA dissociation.  Digoxin was stopped.  At last appointment, he remained in atrial tachycardia with VA dissociation (but occasionally atrial activity is conducted).  He had a high junctional escape with rate in 60s.  He felt fine.  Last week, he developed a low grade fever.  He was seen by PCP and found to have a UTI.  He was put on Cipro.  Over the weekend, he noted that his HR was up to the 140s at times. He was thought to have some volume overload and Lasix was increased back to 40 mg daily.  Repeat UA done yesterday showed clearing of UTI, he was told he could stop Cipro today.   Today, HR is in the 120s with a regular tachycardia, suspect it is his atrial tachycardia conducting 1:1.  SBP is in the 90s.  INRs have been therapeutic.  He is not short of breath with exertion though he has been mildly lightheaded. He feels weaker.  SBP at home running 95-115.   Labs (9/11): K 4.1, creatinine 1.4  Labs (8/12): K 3.8, creatinine 1.3, TSH normal, LFTs normal, LDL 96, HDL 50 Labs (1/13): K 3.9, creatinine 1.3, TSH normal,  LFTs normal Labs (2/13): K 3.8, creatinine 1.3 Labs (5/13): K 3.5, creatinine 1.4, BNP 331, LFTs normal, TSH normal Labs (9/13): LDL 85, HDL 53, BNP 547, LFTs normal Labs (12/13): K 4.3, creatinine 1.49 => 1.4, digoxin 1.4, TSH normal, LFTs normal Labs (1/14): digoxin 1.0, K 4.3, creatinine 1.5 Labs (3/14): free T4 normal Labs (4/14): K 5=> 3.8, creatinine 1.8 => 1.4, digoxin 0.6 Labs (5/14): TSH normal, digoxin level normal Labs (8/14): K 4.2, creatinine 1.66, AST 42, ALT 37 Labs (9/14): K 4, creatinine 1.6, digoxin 1.3, LDL 104, HDL 51, AST 41, ALT 30 Labs (11/14): K 3.6, creatinine 1.5, BNP 126 Labs (12/14): TSH 6.93 Labs (2/15): K 3.7, creatinine 1.4, TSH normal, AST 59, ALT 56 Labs (3/15): TSH low, AST 57, ALT 30, digoxin 1.2 Labs (4/15): K 4, creatinine 1.4, LFTs normal Labs (6/15): TSH normal, digoxin level 0. Labs (8/15): K 4.3, creatinine 1.7 => 1.6, AST 75, ALT 70, hgb 11.8, WBCs 9.7  ECG: Regular SVT 122, ?1:1 AT conduction   Allergies (verified):  1) ! Crestor  2) Vytorin  3) Atenolol   Past Medical History:  1. Coronary artery disease: LHC 4/06 with 60% pLAD, 40% ramus, 60% mRCA, and 90% mPDA. No intervention. Inferior akinesis on LV-gram was suggestive of possible inferior MI with recanalization.  2. Hyperlipidemia: Myalgias with Lipitor and Crestor.  3. Hypothyroidism  4. Peripheral vascular disease  5. Anxiety  6. Skin cancer, hx of 2008 BCC  7. Colonic polyps, hx of  8. Atrial arrhythmias (PAF and atrial tachycardia): Paroxysmal atrial fibrillation with rapid ventricular response treated with amiodarone therapy converting to normal sinus rhythm 02/2009.  He has also had atypical atrial flutter versus atrial tachycardia noted in 4/14, cardioverted to NSR in 4/14. In atypical flutter versus atrial tachycardia again in 1/15, DCCV in 1/15 to NSR. Atrial tachycardia in 7/15 and 8/15 with junctional escape in 60s.  9. Systolic CHF: Probable ischemic cardiomyopathy. Echo  (6/10) with EF 40%, mildly dilated LV, moderate MR.  Echo (3/12): EF 30-35%, inferolateral severe hypokinesis, trivial AI, moderate to severe MR.  10. History of PNA  11. CKD. Baseline creatinine around 1.4.  12. Mitral regurgitation: Moderate by echo 6/10. 2-3+ by cath 2006.  Moderate-severe by echo in 3/12.  13. PVCs  14. BPH 15. Recurrent UTIs 16. Imbalance 17. Carotid stenosis: Dopplers (2/15) with 40-59% BICA stenosis.   Family History:  Family History of CAD Male 1st degree relative <60   Social History:  Retired  Originally from [place], speaks little [place]  Married  Former Smoker   ROS: All systems reviewed and negative except as per HPI.   Current Outpatient Prescriptions  Medication Sig Dispense Refill  . ciprofloxacin (CIPRO) 500 MG tablet Take 1 tablet (500 mg total) by mouth 2 (two) times daily.  20 tablet  0  . co-enzyme Q-10 30 MG capsule Take 90 mg by mouth daily.      . furosemide (LASIX) 40 MG tablet Take 40 mg by mouth daily.       Marland Kitchen levothyroxine (SYNTHROID, LEVOTHROID) 100 MCG tablet Take 100 mcg by mouth daily before breakfast.      . metoprolol succinate (TOPROL XL) 25 MG 24 hr tablet Take 1 tablet (25 mg total) by mouth daily.  30 tablet  3  . Multiple Vitamin (MULTIVITAMIN) tablet Take 1 tablet by mouth daily.      . Omega-3 Fatty Acids (FISH OIL) 1200 MG CAPS Take 1 capsule (1,200 mg total) by mouth daily.  90 capsule  3  . potassium chloride (K-DUR,KLOR-CON) 10 MEQ tablet Take 20 mEq by mouth daily.      . pravastatin (PRAVACHOL) 80 MG tablet Take 1 tablet (80 mg total) by mouth daily.  90 tablet  3  . tamsulosin (FLOMAX) 0.4 MG CAPS capsule Take 0.4 mg by mouth daily.      Marland Kitchen warfarin (COUMADIN) 1 MG tablet Take 1-2 mg by mouth daily. 1 pill (1mg ) everyday except 2 pills (2mg ) on  Mondays, Thursdays and Saturdays or as directed by coumadin clinic      . zolpidem (AMBIEN) 10 MG tablet Take 1 tablet (10 mg total) by mouth at bedtime as needed for sleep.   30 tablet  5  . amiodarone (PACERONE) 200 MG tablet 1 tablet two times a day for 5 days, then 1 tablet daily (200mg ).  30 tablet  3  . lisinopril (PRINIVIL,ZESTRIL) 2.5 MG tablet Take 1 tablet (2.5 mg total) by mouth daily.  30 tablet  3   No current facility-administered medications for this visit.    BP 90/56  Pulse 117  Ht 5\' 4"  (1.626 m)  Wt 64.411 kg (142 lb)  BMI 24.36 kg/m2  SpO2 98% General: NAD Neck: JVP 8 cm; no thyromegaly or thyroid nodule.  Lungs: CTAB  CV: Nondisplaced PMI.  Heart regular, mildly tachycardic, S1/S2, no S3/S4, 1/6 HSM at apex.  No edema.  Right carotid bruit. Abdomen: Soft, nontender, no hepatosplenomegaly, no distention.  Neurologic: Alert and oriented x 3.  Psych: Normal affect. Extremities: No clubbing or cyanosis.   Assessment/Plan: 1. Atrial arrhythmias  Atrial tachycardia with VA dissociation and high junctional escape, rate in 60s at last visit.  Today, he has a regular SVT at 122, possible atrial tachycardia with 1:1 conduction.  This change occurred  around the time of a UTI.  He is now off digoxin.   - Increase amiodarone to 200 mg bid - Continue current Toprol XL and warfarin.  - Stopping Cipro so hopefully will not have interaction with amiodarone or warfarin.  - Followup in 1 week.  If he does not convert or slow on his own with increased amiodarone, will arrange DCCV.  2. HYPERLIPIDEMIA  He is tolerating pravastatin without myalgias. Had myalgias with Lipitor and Crestor.  LDL not ideal, but will continue current pravastatin as he tolerates it.   3. MITRAL INSUFFICIENCY Last echo showed moderate to severe MR. Given age and medical condition, would plan medical management.  4. SYSTOLIC HEART FAILURE, CHRONIC  EF 30-35% on last echo. Breathing is a little worse in SVT, NYHA class II-III symptoms.   - Keep Lasix at 40 mg daily. - Decrease lisinopril to 2.5 mg daily and hold today's dose.  - Continue current Toprol XL.  5. Carotid stenosis:  Moderate bilateral stenosis, repeat study in 2/16.  6. CKD: Follow BMET closely.   Loralie Champagne 05/24/2014

## 2014-05-25 ENCOUNTER — Telehealth: Payer: Self-pay

## 2014-05-25 NOTE — Telephone Encounter (Signed)
Dr. Carlean Jews - Pt's daughter called saying you told him to be here Thursday for an appointment.  The reason she called was because no one had called her from here to confirm this.  You have am appointments only on Thursday.  Where do you want them to come see you at?  [phone]

## 2014-05-26 NOTE — Telephone Encounter (Signed)
I spoke with Angie today because we need an interpreter.  When patient was leaving 102 several days ago, I asked him to be an add-on for Thursday at [facility].

## 2014-05-26 NOTE — Telephone Encounter (Signed)
I have called for an interpreter for tomorrow at 12:30, I will have Maudie Mercury put patient on the schedule. Thanks.

## 2014-05-27 ENCOUNTER — Encounter: Payer: Self-pay | Admitting: Cardiology

## 2014-05-27 ENCOUNTER — Ambulatory Visit (INDEPENDENT_AMBULATORY_CARE_PROVIDER_SITE_OTHER): Payer: Medicare Other | Admitting: Family Medicine

## 2014-05-27 ENCOUNTER — Encounter: Payer: Self-pay | Admitting: Family Medicine

## 2014-05-27 VITALS — BP 100/58 | HR 56 | Temp 98.4°F | Resp 16 | Ht 62.5 in | Wt 142.8 lb

## 2014-05-27 DIAGNOSIS — I2510 Atherosclerotic heart disease of native coronary artery without angina pectoris: Secondary | ICD-10-CM

## 2014-05-27 DIAGNOSIS — R319 Hematuria, unspecified: Principal | ICD-10-CM

## 2014-05-27 DIAGNOSIS — R000 Tachycardia, unspecified: Secondary | ICD-10-CM

## 2014-05-27 DIAGNOSIS — N390 Urinary tract infection, site not specified: Secondary | ICD-10-CM

## 2014-05-27 DIAGNOSIS — I498 Other specified cardiac arrhythmias: Secondary | ICD-10-CM

## 2014-05-27 LAB — POCT UA - MICROSCOPIC ONLY
Casts, Ur, LPF, POC: NEGATIVE
Crystals, Ur, HPF, POC: NEGATIVE
Mucus, UA: NEGATIVE
WBC, Ur, HPF, POC: NEGATIVE
Yeast, UA: NEGATIVE

## 2014-05-27 LAB — POCT URINALYSIS DIPSTICK
Bilirubin, UA: NEGATIVE
Blood, UA: NEGATIVE
Glucose, UA: NEGATIVE
Ketones, UA: NEGATIVE
Leukocytes, UA: NEGATIVE
Nitrite, UA: NEGATIVE
Protein, UA: NEGATIVE
Spec Grav, UA: <=1.005
Urobilinogen, UA: 0.2
pH, UA: 5

## 2014-05-27 NOTE — Addendum Note (Signed)
Addended by: Yvette Rack on: 05/27/2014 04:50 PM   Modules accepted: Orders

## 2014-05-27 NOTE — Progress Notes (Signed)
80 yo man from [place] with two main problems:  1. Atrial tachycardia and intermittent A. Fib 2. Recent UTI with indwelling foley  Last week, he became febrile and urosepsis was diagnosed as he became delirious.  I started him on Cipro and adjusted his amiodarone and warfarin down.  His fever, malaise and delirium cleared over night.    He was feeling a bit weak on Monday, saw his cardiologist Dr. Loralie Champagne, and found to be in atrial tachycardia.  His amiodarone was increased to 400 daily for the week, and Cipro was stopped.  Patient has continued to feel well this week.  He still can hear lung sounds with deep breaths (I think these are rales) intermittently, but even this is less than a year ago.  Objective:  No apparent distress, seen with daughter and son in law Neck: supple, no JVD Chest: few bibasilar rales, good BS, no wheezes Heart:  Regular, 55 bpm, I/VI systolic ejection type murmur Ext:  No edema Skin:  Mild facial actinic keratosis scaling and erythema  Assessment:  Patient is back to baseline.  His atrial tachycardia has resolved, he strength is baseline, and he has no sign of residual UTI  Plan: Urinary tract infection with hematuria, site unspecified - Plan: POCT UA - Microscopic Only, POCT urinalysis dipstick, Urine culture, CANCELED: POCT UA - Microscopic Only, CANCELED: POCT urinalysis dipstick, CANCELED: Urine culture  Sinus tachycardia  We will send patient home with urine specimen cup, as his catheter bag is empty now.   Follow up one month.  Signed, [name], MD

## 2014-05-28 ENCOUNTER — Encounter: Payer: Self-pay | Admitting: Cardiology

## 2014-05-29 LAB — URINE CULTURE
Colony Count: NO GROWTH
Organism ID, Bacteria: NO GROWTH

## 2014-06-01 ENCOUNTER — Ambulatory Visit (HOSPITAL_COMMUNITY)
Admission: RE | Admit: 2014-06-01 | Discharge: 2014-06-01 | Disposition: A | Discharge status: Home / Self Care | Payer: Medicare Other | Source: Ambulatory Visit | Attending: Cardiology | Admitting: Cardiology

## 2014-06-01 VITALS — BP 92/58 | HR 63 | Wt 142.8 lb

## 2014-06-01 DIAGNOSIS — I4891 Unspecified atrial fibrillation: Secondary | ICD-10-CM | POA: Diagnosis not present

## 2014-06-01 DIAGNOSIS — N189 Chronic kidney disease, unspecified: Secondary | ICD-10-CM | POA: Diagnosis not present

## 2014-06-01 DIAGNOSIS — I059 Rheumatic mitral valve disease, unspecified: Secondary | ICD-10-CM | POA: Diagnosis not present

## 2014-06-01 DIAGNOSIS — R000 Tachycardia, unspecified: Secondary | ICD-10-CM | POA: Diagnosis present

## 2014-06-01 DIAGNOSIS — I6529 Occlusion and stenosis of unspecified carotid artery: Secondary | ICD-10-CM | POA: Insufficient documentation

## 2014-06-01 DIAGNOSIS — Z8249 Family history of ischemic heart disease and other diseases of the circulatory system: Secondary | ICD-10-CM | POA: Insufficient documentation

## 2014-06-01 DIAGNOSIS — I480 Paroxysmal atrial fibrillation: Secondary | ICD-10-CM

## 2014-06-01 DIAGNOSIS — I498 Other specified cardiac arrhythmias: Secondary | ICD-10-CM | POA: Diagnosis not present

## 2014-06-01 DIAGNOSIS — N183 Chronic kidney disease, stage 3 unspecified: Secondary | ICD-10-CM

## 2014-06-01 DIAGNOSIS — I739 Peripheral vascular disease, unspecified: Secondary | ICD-10-CM | POA: Insufficient documentation

## 2014-06-01 DIAGNOSIS — I658 Occlusion and stenosis of other precerebral arteries: Secondary | ICD-10-CM | POA: Diagnosis not present

## 2014-06-01 DIAGNOSIS — E785 Hyperlipidemia, unspecified: Secondary | ICD-10-CM | POA: Insufficient documentation

## 2014-06-01 DIAGNOSIS — Z87891 Personal history of nicotine dependence: Secondary | ICD-10-CM | POA: Diagnosis not present

## 2014-06-01 DIAGNOSIS — M79609 Pain in unspecified limb: Secondary | ICD-10-CM | POA: Diagnosis not present

## 2014-06-01 DIAGNOSIS — Z7901 Long term (current) use of anticoagulants: Secondary | ICD-10-CM | POA: Diagnosis not present

## 2014-06-01 DIAGNOSIS — I471 Supraventricular tachycardia, unspecified: Secondary | ICD-10-CM

## 2014-06-01 DIAGNOSIS — I440 Atrioventricular block, first degree: Secondary | ICD-10-CM | POA: Diagnosis not present

## 2014-06-01 DIAGNOSIS — I5022 Chronic systolic (congestive) heart failure: Secondary | ICD-10-CM

## 2014-06-01 DIAGNOSIS — E039 Hypothyroidism, unspecified: Secondary | ICD-10-CM | POA: Diagnosis not present

## 2014-06-01 NOTE — Patient Instructions (Signed)
Increase Furosemide (Lasix) to 40 mg in AM and 20 mg (1/2 tab) in PM for 3 days then back to 40 mg daily  Your physician recommends that you schedule a follow-up appointment in: 1 month with Dr Aundra Dubin and labs (bmet)

## 2014-06-02 NOTE — Progress Notes (Signed)
Patient ID: [name], male   DOB: Nov 14, 1925, 80 y.o.   MRN: [id number] PCP: Dr. Joseph Art  80 yo with history of CAD, ischemic CMP, CKD, and paroxysmal atrial fibrillation and atrial tachycardia presents for cardiology followup. He speaks little [place] and his daughter interprets.  No chest pain.  Patient has a history of myalgias with Crestor and Lipitor.  He has tolerated pravastatin without myalgias.  At a prior appointment, he was noted to be back in atypical atrial flutter versus atrial tachycardia.  It was rate-controlled and initially I thought to treat him with a rate control/anticoagulation strategy.  However, he started to notice increased exertional dyspnea.  Therefore, I took him for cardioversion back to NSR, in 1/15.  He has been on amiodarone.  Recently, he has been back in atrial tachycardia again.  At a prior appointment with the PA, he was in atrial tachycardia with VA dissociation.  Digoxin was stopped.  At 8/19 appointment with me, he remained in atrial tachycardia with VA dissociation (but occasionally atrial activity is conducted).  He had a high junctional escape with rate in 60s.  He felt fine.  Later in the month, he had a UTI and went into atrial tachycardia with RVR.  I saw him on 8/31.  Amiodarone was increased to bid and Lasix was increased due to some volume overload.   3-4 days ago, HR dropped.  He is in NSR today with rate 63.  Main complaint today is pain in his achilles tendons bilaterally that started after taking Cipro.  This has improved.  He has not been walking as much with ankle pain but denies dyspnea.  He has some swelling in his ankles.  No chest pain.  Mild occasional lightheadedness.    Labs (9/11): K 4.1, creatinine 1.4  Labs (8/12): K 3.8, creatinine 1.3, TSH normal, LFTs normal, LDL 96, HDL 50 Labs (1/13): K 3.9, creatinine 1.3, TSH normal, LFTs normal Labs (2/13): K 3.8, creatinine 1.3 Labs (5/13): K 3.5, creatinine 1.4, BNP 331, LFTs normal, TSH  normal Labs (9/13): LDL 85, HDL 53, BNP 547, LFTs normal Labs (12/13): K 4.3, creatinine 1.49 => 1.4, digoxin 1.4, TSH normal, LFTs normal Labs (1/14): digoxin 1.0, K 4.3, creatinine 1.5 Labs (3/14): free T4 normal Labs (4/14): K 5=> 3.8, creatinine 1.8 => 1.4, digoxin 0.6 Labs (5/14): TSH normal, digoxin level normal Labs (8/14): K 4.2, creatinine 1.66, AST 42, ALT 37 Labs (9/14): K 4, creatinine 1.6, digoxin 1.3, LDL 104, HDL 51, AST 41, ALT 30 Labs (11/14): K 3.6, creatinine 1.5, BNP 126 Labs (12/14): TSH 6.93 Labs (2/15): K 3.7, creatinine 1.4, TSH normal, AST 59, ALT 56 Labs (3/15): TSH low, AST 57, ALT 30, digoxin 1.2 Labs (4/15): K 4, creatinine 1.4, LFTs normal Labs (6/15): TSH normal, digoxin level 0. Labs (8/15): K 4.3, creatinine 1.7 => 1.6 => 1.63, AST 75, ALT 70, hgb 11.8, WBCs 9.7  ECG: NSR, 1st degree AV block, QTc 494 msec   Allergies (verified):  1) ! Crestor  2) Vytorin  3) Atenolol   Past Medical History:  1. Coronary artery disease: LHC 4/06 with 60% pLAD, 40% ramus, 60% mRCA, and 90% mPDA. No intervention. Inferior akinesis on LV-gram was suggestive of possible inferior MI with recanalization.  2. Hyperlipidemia: Myalgias with Lipitor and Crestor.  3. Hypothyroidism  4. Peripheral vascular disease  5. Anxiety  6. Skin cancer, hx of 2008 BCC  7. Colonic polyps, hx of  8. Atrial arrhythmias (PAF and atrial tachycardia):  Paroxysmal atrial fibrillation with rapid ventricular response treated with amiodarone therapy converting to normal sinus rhythm 02/2009.  He has also had atypical atrial flutter versus atrial tachycardia noted in 4/14, cardioverted to NSR in 4/14. In atypical flutter versus atrial tachycardia again in 1/15, DCCV in 1/15 to NSR. Atrial tachycardia in 7/15 and 8/15 with junctional escape in 60s. Off digoxin given PAT with block.  9. Systolic CHF: Probable ischemic cardiomyopathy. Echo (6/10) with EF 40%, mildly dilated LV, moderate MR.  Echo (3/12):  EF 30-35%, inferolateral severe hypokinesis, trivial AI, moderate to severe MR.  10. History of PNA  11. CKD. Baseline creatinine around 1.4.  12. Mitral regurgitation: Moderate by echo 6/10. 2-3+ by cath 2006.  Moderate-severe by echo in 3/12.  13. PVCs  14. BPH 15. Recurrent UTIs 16. Imbalance 17. Carotid stenosis: Dopplers (2/15) with 40-59% BICA stenosis.  18. Possible tendinopathy with Cipro  Family History:  Family History of CAD Male 1st degree relative <60   Social History:  Retired  Originally from [place], speaks little [place]  Married  Former Smoker   ROS: All systems reviewed and negative except as per HPI.   Current Outpatient Prescriptions  Medication Sig Dispense Refill  . amiodarone (PACERONE) 200 MG tablet Take 200 mg by mouth daily.      Marland Kitchen co-enzyme Q-10 30 MG capsule Take 90 mg by mouth daily.      . furosemide (LASIX) 40 MG tablet Take 40 mg by mouth daily.       Marland Kitchen levothyroxine (SYNTHROID, LEVOTHROID) 100 MCG tablet Take 100 mcg by mouth daily before breakfast.      . lisinopril (PRINIVIL,ZESTRIL) 2.5 MG tablet Take 1 tablet (2.5 mg total) by mouth daily.  30 tablet  3  . metoprolol succinate (TOPROL XL) 25 MG 24 hr tablet Take 1 tablet (25 mg total) by mouth daily.  30 tablet  3  . Multiple Vitamin (MULTIVITAMIN) tablet Take 1 tablet by mouth daily.      . Omega-3 Fatty Acids (FISH OIL) 1200 MG CAPS Take 1 capsule (1,200 mg total) by mouth daily.  90 capsule  3  . potassium chloride (K-DUR,KLOR-CON) 10 MEQ tablet Take 20 mEq by mouth daily.      . pravastatin (PRAVACHOL) 80 MG tablet Take 1 tablet (80 mg total) by mouth daily.  90 tablet  3  . tamsulosin (FLOMAX) 0.4 MG CAPS capsule Take 0.4 mg by mouth daily.      Marland Kitchen warfarin (COUMADIN) 1 MG tablet Take 1-2 mg by mouth daily. 1 pill (1mg ) everyday except 2 pills (2mg ) on  Mondays, Thursdays and Saturdays or as directed by coumadin clinic      . zolpidem (AMBIEN) 10 MG tablet Take 1 tablet (10 mg total) by  mouth at bedtime as needed for sleep.  30 tablet  5   No current facility-administered medications for this encounter.    BP 92/58  Pulse 63  Wt 142 lb 12 oz (64.751 kg)  SpO2 96% General: NAD Neck: JVP 8 cm; no thyromegaly or thyroid nodule.  Lungs: Slight crackles at bases.   CV: Nondisplaced PMI.  Heart regular, mildly tachycardic, S1/S2, no S3/S4, 1/6 HSM at apex.  1+ ankle edema.  Right carotid bruit. Abdomen: Soft, nontender, no hepatosplenomegaly, no distention.  Neurologic: Alert and oriented x 3.  Psych: Normal affect. Extremities: No clubbing or cyanosis.   Assessment/Plan: 1. Atrial arrhythmias  Back in NSR now. He is off digoxin with history of PAT with  block.  - Can decrease amiodarone to 200 mg daily.  Will need LFTs at followup in 1 months (mildly elevated transaminases when last checked. - Continue current Toprol XL and warfarin.  2. HYPERLIPIDEMIA  He is tolerating pravastatin without myalgias. Had myalgias with Lipitor and Crestor.  LDL not ideal, but will continue current pravastatin as he tolerates it.   3. MITRAL INSUFFICIENCY Last echo showed moderate to severe MR. Given age and medical condition, would plan medical management.  4. SYSTOLIC HEART FAILURE, CHRONIC  EF 30-35% on last echo. NYHA class II symptoms, stable.  He has mild volume overload today.   - Increase Lasix to 40 qam, 20 qpm x 3 days then back to 40 mg daily.  - Continue current lisinopril and Toprol XL.  5. Carotid stenosis: Moderate bilateral stenosis, repeat study in 2/16.  6. CKD: Creatinine stable around 1.6 recently. 7. Achilles tendon pain: Possible tendinopathy from Cipro.  Improving symptoms but still present.  I will have him avoid extensive activity until the pain resolves. Would avoid fluoroquinolones in the future.   Loralie Champagne 06/02/2014

## 2014-06-03 ENCOUNTER — Ambulatory Visit (INDEPENDENT_AMBULATORY_CARE_PROVIDER_SITE_OTHER): Payer: Medicare Other

## 2014-06-03 DIAGNOSIS — I4891 Unspecified atrial fibrillation: Secondary | ICD-10-CM

## 2014-06-03 DIAGNOSIS — Z5181 Encounter for therapeutic drug level monitoring: Secondary | ICD-10-CM

## 2014-06-03 LAB — POCT INR: INR: 2.1

## 2014-06-06 ENCOUNTER — Other Ambulatory Visit: Payer: Self-pay | Admitting: Cardiology

## 2014-06-09 ENCOUNTER — Telehealth: Payer: Self-pay

## 2014-06-09 NOTE — Telephone Encounter (Signed)
Spoke to daughter- transferred to billing to schedule an appt.

## 2014-06-09 NOTE — Telephone Encounter (Signed)
Dr. Joseph Art:  Patient would like for you to see him tomorrow. Daughter called - said you would make exception and they want to see you by appt. Tomorrow only.  Number is [phone]

## 2014-06-10 ENCOUNTER — Ambulatory Visit (INDEPENDENT_AMBULATORY_CARE_PROVIDER_SITE_OTHER): Payer: Medicare Other | Admitting: *Deleted

## 2014-06-10 DIAGNOSIS — I4891 Unspecified atrial fibrillation: Secondary | ICD-10-CM

## 2014-06-10 DIAGNOSIS — Z5181 Encounter for therapeutic drug level monitoring: Secondary | ICD-10-CM

## 2014-06-10 LAB — POCT INR: INR: 3.4

## 2014-06-15 ENCOUNTER — Ambulatory Visit (HOSPITAL_COMMUNITY)
Admission: RE | Admit: 2014-06-15 | Discharge: 2014-06-15 | Disposition: A | Discharge status: Home / Self Care | Payer: Medicare Other | Source: Ambulatory Visit | Attending: Internal Medicine | Admitting: Internal Medicine

## 2014-06-15 ENCOUNTER — Encounter (HOSPITAL_COMMUNITY): Payer: Self-pay

## 2014-06-15 VITALS — BP 88/52 | HR 90 | Wt 140.4 lb

## 2014-06-15 DIAGNOSIS — I4891 Unspecified atrial fibrillation: Secondary | ICD-10-CM | POA: Diagnosis not present

## 2014-06-15 DIAGNOSIS — N189 Chronic kidney disease, unspecified: Secondary | ICD-10-CM | POA: Insufficient documentation

## 2014-06-15 DIAGNOSIS — I059 Rheumatic mitral valve disease, unspecified: Secondary | ICD-10-CM | POA: Diagnosis not present

## 2014-06-15 DIAGNOSIS — I471 Supraventricular tachycardia, unspecified: Secondary | ICD-10-CM | POA: Insufficient documentation

## 2014-06-15 DIAGNOSIS — I6529 Occlusion and stenosis of unspecified carotid artery: Secondary | ICD-10-CM | POA: Diagnosis not present

## 2014-06-15 DIAGNOSIS — I2589 Other forms of chronic ischemic heart disease: Secondary | ICD-10-CM | POA: Diagnosis not present

## 2014-06-15 DIAGNOSIS — Z7901 Long term (current) use of anticoagulants: Secondary | ICD-10-CM | POA: Insufficient documentation

## 2014-06-15 DIAGNOSIS — Z87891 Personal history of nicotine dependence: Secondary | ICD-10-CM | POA: Diagnosis not present

## 2014-06-15 DIAGNOSIS — Z8249 Family history of ischemic heart disease and other diseases of the circulatory system: Secondary | ICD-10-CM | POA: Insufficient documentation

## 2014-06-15 DIAGNOSIS — I129 Hypertensive chronic kidney disease with stage 1 through stage 4 chronic kidney disease, or unspecified chronic kidney disease: Secondary | ICD-10-CM | POA: Insufficient documentation

## 2014-06-15 DIAGNOSIS — I509 Heart failure, unspecified: Secondary | ICD-10-CM | POA: Diagnosis not present

## 2014-06-15 DIAGNOSIS — I5022 Chronic systolic (congestive) heart failure: Secondary | ICD-10-CM | POA: Insufficient documentation

## 2014-06-15 DIAGNOSIS — E785 Hyperlipidemia, unspecified: Secondary | ICD-10-CM | POA: Insufficient documentation

## 2014-06-15 DIAGNOSIS — I480 Paroxysmal atrial fibrillation: Secondary | ICD-10-CM

## 2014-06-15 DIAGNOSIS — I658 Occlusion and stenosis of other precerebral arteries: Secondary | ICD-10-CM | POA: Diagnosis not present

## 2014-06-15 MED ORDER — AMIODARONE HCL 200 MG PO TABS: 200.0000 mg | ORAL_TABLET | Freq: Two times a day (BID) | ORAL | Status: DC

## 2014-06-15 NOTE — Patient Instructions (Signed)
Increase amiodarone to 200mg  tablet twice daily.  We will call you to schedule your pulmonary function test.  Do the following things EVERYDAY: 1) Weigh yourself in the morning before breakfast. Write it down and keep it in a log. 2) Take your medicines as prescribed 3) Eat low salt foods-Limit salt (sodium) to 2000 mg per day.  4) Stay as active as you can everyday 5) Limit all fluids for the day to less than 2 liters

## 2014-06-15 NOTE — Addendum Note (Signed)
Encounter addended by: Larey Dresser, MD on: 06/15/2014 10:36 PM<BR>     Documentation filed: Notes Section

## 2014-06-15 NOTE — Progress Notes (Addendum)
Patient ID: [name], male   DOB: 1926-05-11, 80 y.o.   MRN: [id number] PCP: Dr. Joseph Art  80 yo with history of CAD, ischemic CMP, CKD, and paroxysmal atrial fibrillation and atrial tachycardia presents for cardiology followup. He speaks little [place] and his daughter interprets.  No chest pain.  Patient has a history of myalgias with Crestor and Lipitor.  He has tolerated pravastatin without myalgias.  At a prior appointment, he was noted to be back in atypical atrial flutter versus atrial tachycardia.  It was rate-controlled and initially I thought to treat him with a rate control/anticoagulation strategy.  However, he started to notice increased exertional dyspnea.  Therefore, I took him for cardioversion back to NSR, in 1/15.  He has been on amiodarone.  Recently, he has been back in atrial tachycardia again.  At a prior appointment with the PA, he was in atrial tachycardia with VA dissociation.  Digoxin was stopped.  At 8/19 appointment with me, he remained in atrial tachycardia with VA dissociation (but occasionally atrial activity is conducted).  He had a high junctional escape with rate in 60s.  He felt fine.  Later in the month, he had a UTI and went into atrial tachycardia with RVR.  I saw him on 8/31.  Amiodarone was increased to bid and Lasix was increased due to some volume overload. At last appointment, he was back in NSR and felt better.    Recently, his heart rate has been up and down.  He feels like he is in and out of atrial arrhythmias.  When his HR is "high" (in the 80s-90s), he does not feel good. Energy level is low/more fatigued.  Today, he is in atrial tachycardia with 2:1 block again.  He also notes that he has felt weaker since stopping digoxin.  He still walks without dyspnea, just gets tired.   Labs (9/11): K 4.1, creatinine 1.4  Labs (8/12): K 3.8, creatinine 1.3, TSH normal, LFTs normal, LDL 96, HDL 50 Labs (1/13): K 3.9, creatinine 1.3, TSH normal, LFTs normal Labs  (2/13): K 3.8, creatinine 1.3 Labs (5/13): K 3.5, creatinine 1.4, BNP 331, LFTs normal, TSH normal Labs (9/13): LDL 85, HDL 53, BNP 547, LFTs normal Labs (12/13): K 4.3, creatinine 1.49 => 1.4, digoxin 1.4, TSH normal, LFTs normal Labs (1/14): digoxin 1.0, K 4.3, creatinine 1.5 Labs (3/14): free T4 normal Labs (4/14): K 5=> 3.8, creatinine 1.8 => 1.4, digoxin 0.6 Labs (5/14): TSH normal, digoxin level normal Labs (8/14): K 4.2, creatinine 1.66, AST 42, ALT 37 Labs (9/14): K 4, creatinine 1.6, digoxin 1.3, LDL 104, HDL 51, AST 41, ALT 30 Labs (11/14): K 3.6, creatinine 1.5, BNP 126 Labs (12/14): TSH 6.93 Labs (2/15): K 3.7, creatinine 1.4, TSH normal, AST 59, ALT 56 Labs (3/15): TSH low, AST 57, ALT 30, digoxin 1.2 Labs (4/15): K 4, creatinine 1.4, LFTs normal Labs (6/15): TSH normal, digoxin level 0. Labs (8/15): K 4.3, creatinine 1.7 => 1.6 => 1.63, AST 75, ALT 70, hgb 11.8, WBCs 9.7  ECG: Atrial tachycardia with 2:1 block, rate 90  Allergies (verified):  1) ! Crestor  2) Vytorin  3) Atenolol   Past Medical History:  1. Coronary artery disease: LHC 4/06 with 60% pLAD, 40% ramus, 60% mRCA, and 90% mPDA. No intervention. Inferior akinesis on LV-gram was suggestive of possible inferior MI with recanalization.  2. Hyperlipidemia: Myalgias with Lipitor and Crestor.  3. Hypothyroidism  4. Peripheral vascular disease  5. Anxiety  6. Skin cancer, hx  of 2008 BCC  7. Colonic polyps, hx of  8. Atrial arrhythmias (PAF and atrial tachycardia): Paroxysmal atrial fibrillation with rapid ventricular response treated with amiodarone therapy converting to normal sinus rhythm 02/2009.  He has also had atypical atrial flutter versus atrial tachycardia noted in 4/14, cardioverted to NSR in 4/14. In atypical flutter versus atrial tachycardia again in 1/15, DCCV in 1/15 to NSR. Atrial tachycardia in 7/15, 8/15, 9/15 with junctional escape in 60s or 2:1 block with faster rate. Off digoxin given PAT with  block.  9. Systolic CHF: Probable ischemic cardiomyopathy. Echo (6/10) with EF 40%, mildly dilated LV, moderate MR.  Echo (3/12): EF 30-35%, inferolateral severe hypokinesis, trivial AI, moderate to severe MR.  10. History of PNA  11. CKD. Baseline creatinine around 1.4.  12. Mitral regurgitation: Moderate by echo 6/10. 2-3+ by cath 2006.  Moderate-severe by echo in 3/12.  13. PVCs  14. BPH 15. Recurrent UTIs 16. Imbalance 17. Carotid stenosis: Dopplers (2/15) with 40-59% BICA stenosis.  18. Possible tendinopathy with Cipro  Family History:  Family History of CAD Male 1st degree relative <60   Social History:  Retired  Originally from [place], speaks little [place]  Married  Former Smoker   ROS: All systems reviewed and negative except as per HPI.   Current Outpatient Prescriptions  Medication Sig Dispense Refill  . amiodarone (PACERONE) 200 MG tablet Take 1 tablet (200 mg total) by mouth 2 (two) times daily.  180 tablet  3  . co-enzyme Q-10 30 MG capsule Take 90 mg by mouth daily.      . furosemide (LASIX) 40 MG tablet Take 40 mg by mouth daily.       Marland Kitchen levothyroxine (SYNTHROID, LEVOTHROID) 100 MCG tablet Take 100 mcg by mouth daily before breakfast.      . lisinopril (PRINIVIL,ZESTRIL) 2.5 MG tablet Take 1 tablet (2.5 mg total) by mouth daily.  30 tablet  3  . metoprolol succinate (TOPROL XL) 25 MG 24 hr tablet Take 1 tablet (25 mg total) by mouth daily.  30 tablet  3  . Multiple Vitamin (MULTIVITAMIN) tablet Take 1 tablet by mouth daily.      . Omega-3 Fatty Acids (FISH OIL) 1200 MG CAPS Take 1 capsule (1,200 mg total) by mouth daily.  90 capsule  3  . potassium chloride (K-DUR,KLOR-CON) 10 MEQ tablet Take 20 mEq by mouth daily.      . pravastatin (PRAVACHOL) 80 MG tablet Take 1 tablet (80 mg total) by mouth daily.  90 tablet  3  . tamsulosin (FLOMAX) 0.4 MG CAPS capsule Take 0.4 mg by mouth daily.      Marland Kitchen warfarin (COUMADIN) 1 MG tablet Take 1-2 mg by mouth daily. 1 pill (1mg )  everyday except 2 pills (2mg ) on  Mondays, Thursdays and Saturdays or as directed by coumadin clinic      . zolpidem (AMBIEN) 10 MG tablet Take 1 tablet (10 mg total) by mouth at bedtime as needed for sleep.  30 tablet  5   No current facility-administered medications for this encounter.    BP 88/52  Pulse 90  Wt 140 lb 6.4 oz (63.685 kg)  SpO2 99% General: NAD Neck: JVP 7 cm; no thyromegaly or thyroid nodule.  Lungs: Slight crackles at bases.   CV: Nondisplaced PMI.  Heart regular, mildly tachycardic, S1/S2, no S3/S4, 1/6 HSM at apex.  No ankle edema.  Right carotid bruit. Abdomen: Soft, nontender, no hepatosplenomegaly, no distention.  Neurologic: Alert and oriented x  3.  Psych: Normal affect. Extremities: No clubbing or cyanosis.   Assessment/Plan: 1. Atrial arrhythmias  He is in atrial tachycardia with 2:1 block again.  HR in 90s.  He does not feel as good when his HR is up.   - Increasing amiodarone has helped control this in the past.  I am going to increase amiodarone to 200 mg bid and probably leave it at this dose long-term.  Will need LFTs at followup in 2 wks.  I am also going to arrange for PFTs given decreased stamina. - Continue current Toprol XL and warfarin.  2. HYPERLIPIDEMIA  He is tolerating pravastatin without myalgias. Had myalgias with Lipitor and Crestor.  LDL not ideal, but will continue current pravastatin as he tolerates it.   3. MITRAL INSUFFICIENCY Last echo showed moderate to severe MR. Given age and medical condition, would plan medical management.  4. SYSTOLIC HEART FAILURE, CHRONIC  EF 30-35% on last echo. NYHA class II symptoms, stable.  Volume status look ok on Lasix 40 mg daily.   - Continue current Lasix.  - Continue current lisinopril and Toprol XL.  - He feels weaker off digoxin, but given runs of paroxysmal atrial tachycardia with block, would hold off.  5. Carotid stenosis: Moderate bilateral stenosis, repeat study in 2/16.  6. CKD: Creatinine  stable around 1.6 recently.   Loralie Champagne 06/15/2014

## 2014-06-17 ENCOUNTER — Ambulatory Visit (INDEPENDENT_AMBULATORY_CARE_PROVIDER_SITE_OTHER): Payer: Medicare Other | Admitting: Pharmacist

## 2014-06-17 DIAGNOSIS — I4891 Unspecified atrial fibrillation: Secondary | ICD-10-CM

## 2014-06-17 DIAGNOSIS — Z5181 Encounter for therapeutic drug level monitoring: Secondary | ICD-10-CM

## 2014-06-17 LAB — POCT INR: INR: 3.3

## 2014-06-18 DIAGNOSIS — Z85828 Personal history of other malignant neoplasm of skin: Secondary | ICD-10-CM

## 2014-06-21 ENCOUNTER — Other Ambulatory Visit: Payer: Self-pay

## 2014-06-21 DIAGNOSIS — I471 Supraventricular tachycardia: Secondary | ICD-10-CM

## 2014-06-21 MED ORDER — LISINOPRIL 2.5 MG PO TABS: 2.5000 mg | ORAL_TABLET | Freq: Every day | ORAL | Status: DC

## 2014-06-24 ENCOUNTER — Ambulatory Visit (HOSPITAL_COMMUNITY)
Admission: RE | Admit: 2014-06-24 | Discharge: 2014-06-24 | Disposition: A | Discharge status: Home / Self Care | Payer: Medicare Other | Source: Ambulatory Visit | Attending: Cardiology | Admitting: Cardiology

## 2014-06-24 ENCOUNTER — Ambulatory Visit (INDEPENDENT_AMBULATORY_CARE_PROVIDER_SITE_OTHER): Payer: Medicare Other

## 2014-06-24 DIAGNOSIS — I480 Paroxysmal atrial fibrillation: Secondary | ICD-10-CM

## 2014-06-24 DIAGNOSIS — I4891 Unspecified atrial fibrillation: Secondary | ICD-10-CM | POA: Diagnosis not present

## 2014-06-24 DIAGNOSIS — I5022 Chronic systolic (congestive) heart failure: Secondary | ICD-10-CM | POA: Insufficient documentation

## 2014-06-24 DIAGNOSIS — Z5181 Encounter for therapeutic drug level monitoring: Secondary | ICD-10-CM

## 2014-06-24 LAB — PULMONARY FUNCTION TEST
DL/VA % pred: 94 %
DL/VA: 3.91 ml/min/mmHg/L
DLCO COR: 14.36 ml/min/mmHg
DLCO UNC: 14.36 ml/min/mmHg
DLCO cor % pred: 59 %
DLCO unc % pred: 59 %
FEF 25-75 Post: 3.03 L/sec
FEF 25-75 Pre: 1.38 L/sec
FEF2575-%Change-Post: 119 %
FEF2575-%Pred-Post: 272 %
FEF2575-%Pred-Pre: 124 %
FEV1-%Change-Post: 19 %
FEV1-%Pred-Post: 114 %
FEV1-%Pred-Pre: 95 %
FEV1-Post: 2.14 L
FEV1-Pre: 1.79 L
FEV1FVC-%Change-Post: 5 %
FEV1FVC-%Pred-Pre: 107 %
FEV6-%Change-Post: 12 %
FEV6-%PRED-POST: 104 %
FEV6-%Pred-Pre: 93 %
FEV6-PRE: 2.35 L
FEV6-Post: 2.64 L
FEV6FVC-%Change-Post: 0 %
FEV6FVC-%PRED-POST: 107 %
FEV6FVC-%Pred-Pre: 108 %
FVC-%Change-Post: 12 %
FVC-%Pred-Post: 96 %
FVC-%Pred-Pre: 85 %
FVC-POST: 2.69 L
FVC-Pre: 2.39 L
POST FEV6/FVC RATIO: 98 %
Post FEV1/FVC ratio: 79 %
Pre FEV1/FVC ratio: 75 %
Pre FEV6/FVC Ratio: 99 %
RV % pred: 52 %
RV: 1.3 L
TLC % PRED: 66 %
TLC: 3.9 L

## 2014-06-24 LAB — POCT INR: INR: 3

## 2014-06-24 MED ORDER — ALBUTEROL SULFATE (2.5 MG/3ML) 0.083% IN NEBU
2.5000 mg | INHALATION_SOLUTION | Freq: Once | RESPIRATORY_TRACT | Status: AC
  Administered 2014-06-24: 2.5 mg via RESPIRATORY_TRACT

## 2014-06-28 ENCOUNTER — Telehealth: Payer: Self-pay | Admitting: Cardiology

## 2014-06-28 ENCOUNTER — Ambulatory Visit: Payer: Medicare Other | Admitting: Cardiology

## 2014-06-28 NOTE — Telephone Encounter (Signed)
follow up:    Pt 's daughter called and would like a call back on pt lung capacity test please.

## 2014-06-28 NOTE — Telephone Encounter (Signed)
Spoke w/pt's daughter and provided pft results

## 2014-07-01 ENCOUNTER — Ambulatory Visit (INDEPENDENT_AMBULATORY_CARE_PROVIDER_SITE_OTHER): Payer: Medicare Other

## 2014-07-01 DIAGNOSIS — Z5181 Encounter for therapeutic drug level monitoring: Secondary | ICD-10-CM

## 2014-07-01 DIAGNOSIS — I4891 Unspecified atrial fibrillation: Secondary | ICD-10-CM

## 2014-07-01 LAB — POCT INR: INR: 3.4

## 2014-07-05 ENCOUNTER — Encounter (HOSPITAL_COMMUNITY): Payer: Self-pay

## 2014-07-05 ENCOUNTER — Encounter: Payer: Self-pay | Admitting: Cardiology

## 2014-07-05 ENCOUNTER — Ambulatory Visit (HOSPITAL_COMMUNITY)
Admission: RE | Admit: 2014-07-05 | Discharge: 2014-07-05 | Disposition: A | Discharge status: Home / Self Care | Payer: Medicare Other | Source: Ambulatory Visit | Attending: Cardiology | Admitting: Cardiology

## 2014-07-05 VITALS — BP 100/50 | HR 63 | Wt 139.8 lb

## 2014-07-05 DIAGNOSIS — I4891 Unspecified atrial fibrillation: Secondary | ICD-10-CM | POA: Diagnosis not present

## 2014-07-05 DIAGNOSIS — I340 Nonrheumatic mitral (valve) insufficiency: Secondary | ICD-10-CM | POA: Insufficient documentation

## 2014-07-05 DIAGNOSIS — Z79899 Other long term (current) drug therapy: Secondary | ICD-10-CM | POA: Diagnosis not present

## 2014-07-05 DIAGNOSIS — I471 Supraventricular tachycardia: Secondary | ICD-10-CM | POA: Diagnosis present

## 2014-07-05 DIAGNOSIS — Z8249 Family history of ischemic heart disease and other diseases of the circulatory system: Secondary | ICD-10-CM | POA: Diagnosis not present

## 2014-07-05 DIAGNOSIS — E785 Hyperlipidemia, unspecified: Secondary | ICD-10-CM | POA: Diagnosis not present

## 2014-07-05 DIAGNOSIS — E039 Hypothyroidism, unspecified: Secondary | ICD-10-CM | POA: Diagnosis not present

## 2014-07-05 DIAGNOSIS — I6523 Occlusion and stenosis of bilateral carotid arteries: Secondary | ICD-10-CM | POA: Insufficient documentation

## 2014-07-05 DIAGNOSIS — I2510 Atherosclerotic heart disease of native coronary artery without angina pectoris: Secondary | ICD-10-CM

## 2014-07-05 DIAGNOSIS — I5022 Chronic systolic (congestive) heart failure: Secondary | ICD-10-CM | POA: Insufficient documentation

## 2014-07-05 DIAGNOSIS — Z85828 Personal history of other malignant neoplasm of skin: Secondary | ICD-10-CM | POA: Insufficient documentation

## 2014-07-05 DIAGNOSIS — N189 Chronic kidney disease, unspecified: Secondary | ICD-10-CM | POA: Insufficient documentation

## 2014-07-05 DIAGNOSIS — I4892 Unspecified atrial flutter: Secondary | ICD-10-CM

## 2014-07-05 LAB — COMPREHENSIVE METABOLIC PANEL
ALT: 60 U/L — AB (ref 0–53)
AST: 93 U/L — ABNORMAL HIGH (ref 0–37)
Albumin: 3.3 g/dL — ABNORMAL LOW (ref 3.5–5.2)
Alkaline Phosphatase: 62 U/L (ref 39–117)
Anion gap: 15 (ref 5–15)
BILIRUBIN TOTAL: 1.1 mg/dL (ref 0.3–1.2)
BUN: 26 mg/dL — ABNORMAL HIGH (ref 6–23)
CO2: 22 meq/L (ref 19–32)
Calcium: 8.8 mg/dL (ref 8.4–10.5)
Chloride: 101 mEq/L (ref 96–112)
Creatinine, Ser: 1.75 mg/dL — ABNORMAL HIGH (ref 0.50–1.35)
GFR, EST AFRICAN AMERICAN: 39 mL/min — AB (ref >90)
GFR, EST NON AFRICAN AMERICAN: 34 mL/min — AB (ref >90)
GLUCOSE: 113 mg/dL — AB (ref 70–99)
Potassium: 4.2 mEq/L (ref 3.7–5.3)
SODIUM: 138 meq/L (ref 137–147)
Total Protein: 7.3 g/dL (ref 6.0–8.3)

## 2014-07-05 LAB — TSH: TSH: 4.72 u[IU]/mL — ABNORMAL HIGH (ref 0.350–4.500)

## 2014-07-05 MED ORDER — FUROSEMIDE 40 MG PO TABS: 80.0000 mg | ORAL_TABLET | Freq: Every day | ORAL | Status: DC

## 2014-07-05 MED ORDER — FUROSEMIDE 40 MG PO TABS: ORAL_TABLET | ORAL | Status: DC

## 2014-07-05 MED ORDER — DIGOXIN 125 MCG PO TABS: 0.0625 mg | ORAL_TABLET | ORAL | Status: DC

## 2014-07-05 NOTE — Patient Instructions (Addendum)
Increase Furosemide to 40 mg (1 tab) in AM and 20 mg (1/2 tab) in PM  Start Digoxin 0.0625 mg (1/2 tab) every other day   Labs today  Your physician recommends that you schedule a follow-up appointment in: 2 week with labs (bmet, digoxin)

## 2014-07-06 NOTE — Progress Notes (Signed)
Patient ID: [name], male   DOB: 01/25/1926, 80 y.o.   MRN: [id number] PCP: Dr. Joseph Art  80 yo with history of CAD, ischemic CMP, CKD, and paroxysmal atrial fibrillation and atrial tachycardia presents for cardiology followup. He speaks little [place] and his daughter interprets.  No chest pain.  Patient has a history of myalgias with Crestor and Lipitor.  He has tolerated pravastatin without myalgias.  At a prior appointment, he was noted to be back in atypical atrial flutter versus atrial tachycardia.  It was rate-controlled and initially I thought to treat him with a rate control/anticoagulation strategy.  However, he started to notice increased exertional dyspnea.  Therefore, I took him for cardioversion back to NSR, in 1/15.  He has been on amiodarone.  Recently, he has been back in atrial tachycardia again.  At a prior appointment with the PA, he was in atrial tachycardia with VA dissociation.  Digoxin was stopped.  At 8/19 appointment with me, he remained in atrial tachycardia with VA dissociation (but occasionally atrial activity is conducted).  He had a high junctional escape with rate in 60s.  He felt fine.  Later in the month, he had a UTI and went into atrial tachycardia with RVR.  I saw him on 8/31.  Amiodarone was increased to bid and Lasix was increased due to some volume overload.  Amiodarone decreased back to 200 mg daily but at last appointment, he was back in atrial tachy with 2:1 block and felt bad.  I again increased amiodarone to 200 mg bid.   Today, he is in NSR.  He has poor appetite and feels fatigued.  Weight is down 1 lb.  He is more fatigued when he walks and is not going as far, he rests every 10 minutes.  He thinks his stamina has been much worse since he stopped digoxin.  He coughs when he lies down.  PFTs done in 10/15 showed mild restriction, no marked abnormalities.   Labs (9/11): K 4.1, creatinine 1.4  Labs (8/12): K 3.8, creatinine 1.3, TSH normal, LFTs normal,  LDL 96, HDL 50 Labs (1/13): K 3.9, creatinine 1.3, TSH normal, LFTs normal Labs (2/13): K 3.8, creatinine 1.3 Labs (5/13): K 3.5, creatinine 1.4, BNP 331, LFTs normal, TSH normal Labs (9/13): LDL 85, HDL 53, BNP 547, LFTs normal Labs (12/13): K 4.3, creatinine 1.49 => 1.4, digoxin 1.4, TSH normal, LFTs normal Labs (1/14): digoxin 1.0, K 4.3, creatinine 1.5 Labs (3/14): free T4 normal Labs (4/14): K 5=> 3.8, creatinine 1.8 => 1.4, digoxin 0.6 Labs (5/14): TSH normal, digoxin level normal Labs (8/14): K 4.2, creatinine 1.66, AST 42, ALT 37 Labs (9/14): K 4, creatinine 1.6, digoxin 1.3, LDL 104, HDL 51, AST 41, ALT 30 Labs (11/14): K 3.6, creatinine 1.5, BNP 126 Labs (12/14): TSH 6.93 Labs (2/15): K 3.7, creatinine 1.4, TSH normal, AST 59, ALT 56 Labs (3/15): TSH low, AST 57, ALT 30, digoxin 1.2 Labs (4/15): K 4, creatinine 1.4, LFTs normal Labs (6/15): TSH normal, digoxin level 0. Labs (8/15): K 4.3, creatinine 1.7 => 1.6 => 1.63, AST 75, ALT 70, hgb 11.8, WBCs 9.7  ECG:  NSR, 1st degree AV block, LPFB  Allergies (verified):  1) ! Crestor  2) Vytorin  3) Atenolol   Past Medical History:  1. Coronary artery disease: LHC 4/06 with 60% pLAD, 40% ramus, 60% mRCA, and 90% mPDA. No intervention. Inferior akinesis on LV-gram was suggestive of possible inferior MI with recanalization.  2. Hyperlipidemia: Myalgias with Lipitor  and Crestor.  3. Hypothyroidism  4. Peripheral vascular disease  5. Anxiety  6. Skin cancer, hx of 2008 BCC  7. Colonic polyps, hx of  8. Atrial arrhythmias (PAF and atrial tachycardia): Paroxysmal atrial fibrillation with rapid ventricular response treated with amiodarone therapy converting to normal sinus rhythm 02/2009.  He has also had atypical atrial flutter versus atrial tachycardia noted in 4/14, cardioverted to NSR in 4/14. In atypical flutter versus atrial tachycardia again in 1/15, DCCV in 1/15 to NSR. Atrial tachycardia in 7/15, 8/15, 9/15 with junctional  escape in 60s or 2:1 block with faster rate. Off digoxin given PAT with block.  PFTs (10/15) mild restriction, no marked abnormalities.  9. Systolic CHF: Probable ischemic cardiomyopathy. Echo (6/10) with EF 40%, mildly dilated LV, moderate MR.  Echo (3/12): EF 30-35%, inferolateral severe hypokinesis, trivial AI, moderate to severe MR.  10. History of PNA  11. CKD. Baseline creatinine around 1.4.  12. Mitral regurgitation: Moderate by echo 6/10. 2-3+ by cath 2006.  Moderate-severe by echo in 3/12.  13. PVCs  14. BPH 15. Recurrent UTIs 16. Imbalance 17. Carotid stenosis: Dopplers (2/15) with 40-59% BICA stenosis.  18. Possible tendinopathy with Cipro  Family History:  Family History of CAD Male 1st degree relative <60   Social History:  Retired  Originally from [place], speaks little [place]  Married  Former Smoker   ROS: All systems reviewed and negative except as per HPI.   Current Outpatient Prescriptions  Medication Sig Dispense Refill  . amiodarone (PACERONE) 200 MG tablet Take 1 tablet (200 mg total) by mouth 2 (two) times daily.  180 tablet  3  . co-enzyme Q-10 30 MG capsule Take 90 mg by mouth daily.      Marland Kitchen levothyroxine (SYNTHROID, LEVOTHROID) 100 MCG tablet Take 100 mcg by mouth daily before breakfast.      . lisinopril (PRINIVIL,ZESTRIL) 2.5 MG tablet Take 1 tablet (2.5 mg total) by mouth daily.  90 tablet  0  . metoprolol succinate (TOPROL XL) 25 MG 24 hr tablet Take 1 tablet (25 mg total) by mouth daily.  30 tablet  3  . Multiple Vitamin (MULTIVITAMIN) tablet Take 1 tablet by mouth daily.      . Omega-3 Fatty Acids (FISH OIL) 1200 MG CAPS Take 1 capsule (1,200 mg total) by mouth daily.  90 capsule  3  . potassium chloride (K-DUR,KLOR-CON) 10 MEQ tablet Take 20 mEq by mouth daily.      . pravastatin (PRAVACHOL) 80 MG tablet Take 1 tablet (80 mg total) by mouth daily.  90 tablet  3  . tamsulosin (FLOMAX) 0.4 MG CAPS capsule Take 0.4 mg by mouth daily.      Marland Kitchen warfarin  (COUMADIN) 1 MG tablet Take 1-2 mg by mouth daily. 1 pill (1mg ) everyday except 2 pills (2mg ) on  Mondays, Thursdays and Saturdays or as directed by coumadin clinic      . zolpidem (AMBIEN) 10 MG tablet Take 1 tablet (10 mg total) by mouth at bedtime as needed for sleep.  30 tablet  5  . digoxin (LANOXIN) 0.125 MG tablet Take 0.5 tablets (0.0625 mg total) by mouth every other day.  15 tablet  3  . furosemide (LASIX) 40 MG tablet Take 1 tab in AM and 1/2 tab in PM  45 tablet  3   No current facility-administered medications for this encounter.    BP 100/50  Pulse 63  Wt 139 lb 12.8 oz (63.413 kg)  SpO2 97% General: NAD  Neck: JVP 8 cm; no thyromegaly or thyroid nodule.  Lungs: Slight crackles at bases.   CV: Nondisplaced PMI.  Heart regular, mildly tachycardic, S1/S2, no S3/S4, 1/6 HSM at apex.  No ankle edema.  Right carotid bruit. Abdomen: Soft, nontender, no hepatosplenomegaly, no distention.  Neurologic: Alert and oriented x 3.  Psych: Normal affect. Extremities: No clubbing or cyanosis.   Assessment/Plan: 1. Atrial arrhythmias  Atrial tachycardia at last appointment, now back in NSR.  He seems to stay in NSR more at amiodarone 200 mg bid, so I will leave him at this dose.  Recent PFTs did not suggest amiodarone lung toxicity.  - Keep amiodarone at 200 mg bid, check LFTs/TSH today.   - Continue current Toprol XL and warfarin.  2. HYPERLIPIDEMIA  He is tolerating pravastatin without myalgias. Had myalgias with Lipitor and Crestor.  LDL not ideal, but will continue current pravastatin as he tolerates it.   3. MITRAL INSUFFICIENCY Last echo showed moderate to severe MR. Given age and medical condition, would plan medical management.  4. SYSTOLIC HEART FAILURE, CHRONIC  EF 30-35% on last echo. NYHA class III symptoms.  Possible mild volume overload.  - Increase Lasix to 40 qam/20 qpm.  Repeat BMET in 2 wks.  - Continue current lisinopril and Toprol XL.  - He feels like he got worse off  digoxin.  I am going to try him back on digoxin at 0.0625 mg every other day (his prior dose).  Will get digoxin level in 2 wks.  5. Carotid stenosis: Moderate bilateral stenosis, repeat study in 2/16.  6. CKD: Creatinine stable around 1.6 recently.   Loralie Champagne 07/06/2014

## 2014-07-08 ENCOUNTER — Ambulatory Visit (INDEPENDENT_AMBULATORY_CARE_PROVIDER_SITE_OTHER): Payer: Medicare Other | Admitting: Pharmacist

## 2014-07-08 DIAGNOSIS — I4891 Unspecified atrial fibrillation: Secondary | ICD-10-CM

## 2014-07-08 DIAGNOSIS — Z5181 Encounter for therapeutic drug level monitoring: Secondary | ICD-10-CM

## 2014-07-08 LAB — POCT INR: INR: 2.7

## 2014-07-08 MED ORDER — WARFARIN SODIUM 1 MG PO TABS: 1.0000 mg | ORAL_TABLET | Freq: Every day | ORAL | Status: DC

## 2014-07-09 ENCOUNTER — Telehealth: Payer: Self-pay

## 2014-07-09 NOTE — Telephone Encounter (Signed)
Pt was seen for labs at the cardiologist a few days ago and was told that a few of his medications would need to change and to expect a call from Dr. Carlean Jews (his primary care) for this to take place. Pt's daughter called because they have not heard from [place] regarding the medication change. She said the cardiologist sent [place] a message three days ago.

## 2014-07-09 NOTE — Telephone Encounter (Signed)
Called [facility] [phone] and left message to return call regarding this and to clarify because we have not received any messages

## 2014-07-10 ENCOUNTER — Telehealth: Payer: Self-pay

## 2014-07-10 DIAGNOSIS — E039 Hypothyroidism, unspecified: Secondary | ICD-10-CM

## 2014-07-10 NOTE — Telephone Encounter (Signed)
Patient's daughter called needing a new prescription for her father's thyroid medication. Please advise at [phone]. She is very anxious

## 2014-07-13 MED ORDER — LEVOTHYROXINE SODIUM 100 MCG PO TABS: 100.0000 ug | ORAL_TABLET | Freq: Every day | ORAL | Status: DC

## 2014-07-13 NOTE — Telephone Encounter (Signed)
See labs 10/12. Can we send in refill

## 2014-07-13 NOTE — Telephone Encounter (Signed)
Notified daughter RF at same dose sent in. She agreed to have pt return in 1 mos for recheck.

## 2014-07-13 NOTE — Telephone Encounter (Signed)
See labs 10/12

## 2014-07-13 NOTE — Telephone Encounter (Signed)
Even though his results were a little elevated I would stay on the same dose of thyroid medication and recheck in a month to see if it has stabilized since his hospitalization.

## 2014-07-19 ENCOUNTER — Other Ambulatory Visit: Payer: Self-pay | Admitting: Urology

## 2014-07-19 DIAGNOSIS — N281 Cyst of kidney, acquired: Secondary | ICD-10-CM

## 2014-07-20 ENCOUNTER — Encounter (HOSPITAL_COMMUNITY): Payer: Self-pay

## 2014-07-20 ENCOUNTER — Ambulatory Visit (HOSPITAL_COMMUNITY)
Admission: RE | Admit: 2014-07-20 | Discharge: 2014-07-20 | Disposition: A | Discharge status: Home / Self Care | Payer: Medicare Other | Source: Ambulatory Visit | Attending: Cardiology | Admitting: Cardiology

## 2014-07-20 VITALS — BP 106/58 | HR 59 | Wt 137.8 lb

## 2014-07-20 DIAGNOSIS — E039 Hypothyroidism, unspecified: Secondary | ICD-10-CM | POA: Insufficient documentation

## 2014-07-20 DIAGNOSIS — I6523 Occlusion and stenosis of bilateral carotid arteries: Secondary | ICD-10-CM | POA: Insufficient documentation

## 2014-07-20 DIAGNOSIS — E785 Hyperlipidemia, unspecified: Secondary | ICD-10-CM | POA: Diagnosis not present

## 2014-07-20 DIAGNOSIS — I480 Paroxysmal atrial fibrillation: Secondary | ICD-10-CM | POA: Insufficient documentation

## 2014-07-20 DIAGNOSIS — N189 Chronic kidney disease, unspecified: Secondary | ICD-10-CM | POA: Diagnosis not present

## 2014-07-20 DIAGNOSIS — I5022 Chronic systolic (congestive) heart failure: Secondary | ICD-10-CM

## 2014-07-20 DIAGNOSIS — Z7901 Long term (current) use of anticoagulants: Secondary | ICD-10-CM | POA: Insufficient documentation

## 2014-07-20 DIAGNOSIS — I2510 Atherosclerotic heart disease of native coronary artery without angina pectoris: Secondary | ICD-10-CM | POA: Insufficient documentation

## 2014-07-20 DIAGNOSIS — Z87891 Personal history of nicotine dependence: Secondary | ICD-10-CM | POA: Insufficient documentation

## 2014-07-20 DIAGNOSIS — I255 Ischemic cardiomyopathy: Secondary | ICD-10-CM | POA: Diagnosis not present

## 2014-07-20 DIAGNOSIS — I471 Supraventricular tachycardia: Secondary | ICD-10-CM | POA: Insufficient documentation

## 2014-07-20 DIAGNOSIS — I340 Nonrheumatic mitral (valve) insufficiency: Secondary | ICD-10-CM | POA: Insufficient documentation

## 2014-07-20 DIAGNOSIS — I4892 Unspecified atrial flutter: Secondary | ICD-10-CM

## 2014-07-20 LAB — COMPREHENSIVE METABOLIC PANEL
ALT: 82 U/L — ABNORMAL HIGH (ref 0–53)
ANION GAP: 15 (ref 5–15)
AST: 134 U/L — AB (ref 0–37)
Albumin: 3.4 g/dL — ABNORMAL LOW (ref 3.5–5.2)
Alkaline Phosphatase: 67 U/L (ref 39–117)
BUN: 21 mg/dL (ref 6–23)
CO2: 24 meq/L (ref 19–32)
CREATININE: 1.49 mg/dL — AB (ref 0.50–1.35)
Calcium: 9 mg/dL (ref 8.4–10.5)
Chloride: 99 mEq/L (ref 96–112)
GFR calc Af Amer: 47 mL/min — ABNORMAL LOW (ref >90)
GFR calc non Af Amer: 41 mL/min — ABNORMAL LOW (ref >90)
Glucose, Bld: 119 mg/dL — ABNORMAL HIGH (ref 70–99)
Potassium: 4.4 mEq/L (ref 3.7–5.3)
Sodium: 138 mEq/L (ref 137–147)
Total Bilirubin: 0.6 mg/dL (ref 0.3–1.2)
Total Protein: 7.6 g/dL (ref 6.0–8.3)

## 2014-07-20 LAB — DIGOXIN LEVEL: DIGOXIN LVL: 0.4 ng/mL — AB (ref 0.8–2.0)

## 2014-07-20 MED ORDER — METOPROLOL SUCCINATE ER 25 MG PO TB24: 25.0000 mg | ORAL_TABLET | Freq: Every day | ORAL | Status: DC

## 2014-07-20 NOTE — Patient Instructions (Signed)
Doing well  Call any issues.  Follow up in month.  Do the following things EVERYDAY: 1) Weigh yourself in the morning before breakfast. Write it down and keep it in a log. 2) Take your medicines as prescribed 3) Eat low salt foods-Limit salt (sodium) to 2000 mg per day.  4) Stay as active as you can everyday 5) Limit all fluids for the day to less than 2 liters 6)

## 2014-07-21 ENCOUNTER — Ambulatory Visit: Payer: Medicare Other | Admitting: Cardiology

## 2014-07-21 ENCOUNTER — Encounter: Payer: Self-pay | Admitting: Cardiology

## 2014-07-21 ENCOUNTER — Other Ambulatory Visit: Payer: Self-pay | Admitting: Family Medicine

## 2014-07-21 NOTE — Progress Notes (Signed)
Patient ID: [name], male   DOB: 07/09/1926, 80 y.o.   MRN: [id number] PCP: Dr. Joseph Art  80 yo with history of CAD, ischemic CMP, CKD, and paroxysmal atrial fibrillation and atrial tachycardia presents for cardiology followup. He speaks little [place] and his daughter interprets.  No chest pain.  Patient has a history of myalgias with Crestor and Lipitor.  He has tolerated pravastatin without myalgias.  At a prior appointment, he was noted to be back in atypical atrial flutter versus atrial tachycardia.  It was rate-controlled and initially I thought to treat him with a rate control/anticoagulation strategy.  However, he started to notice increased exertional dyspnea.  Therefore, I took him for cardioversion back to NSR, in 1/15.  He has been on amiodarone.  Recently, he has been back in atrial tachycardia again.  At a prior appointment with the PA, he was in atrial tachycardia with VA dissociation.  Digoxin was stopped.  At 8/19 appointment with me, he remained in atrial tachycardia with VA dissociation (but occasionally atrial activity is conducted).  He had a high junctional escape with rate in 60s.  He felt fine.  Later in the month, he had a UTI and went into atrial tachycardia with RVR.  I saw him on 8/31.  Amiodarone was increased to bid and Lasix was increased due to some volume overload.  Amiodarone decreased back to 200 mg daily but at a prior appointment, he was back in atrial tachy with 2:1 block and felt bad.  I again increased amiodarone to 200 mg bid. PFTs done in 10/15 showed mild restriction, no marked abnormalities.   Today, he is in NSR.  At last appointment, I restarted digoxin and increased Lasix.  Weight is down 2 lbs.  He feels better, stronger.  Cough has resolved, more energy.  He is now walking 30 minutes/day.    Labs (9/11): K 4.1, creatinine 1.4  Labs (8/12): K 3.8, creatinine 1.3, TSH normal, LFTs normal, LDL 96, HDL 50 Labs (1/13): K 3.9, creatinine 1.3, TSH normal,  LFTs normal Labs (2/13): K 3.8, creatinine 1.3 Labs (5/13): K 3.5, creatinine 1.4, BNP 331, LFTs normal, TSH normal Labs (9/13): LDL 85, HDL 53, BNP 547, LFTs normal Labs (12/13): K 4.3, creatinine 1.49 => 1.4, digoxin 1.4, TSH normal, LFTs normal Labs (1/14): digoxin 1.0, K 4.3, creatinine 1.5 Labs (3/14): free T4 normal Labs (4/14): K 5=> 3.8, creatinine 1.8 => 1.4, digoxin 0.6 Labs (5/14): TSH normal, digoxin level normal Labs (8/14): K 4.2, creatinine 1.66, AST 42, ALT 37 Labs (9/14): K 4, creatinine 1.6, digoxin 1.3, LDL 104, HDL 51, AST 41, ALT 30 Labs (11/14): K 3.6, creatinine 1.5, BNP 126 Labs (12/14): TSH 6.93 Labs (2/15): K 3.7, creatinine 1.4, TSH normal, AST 59, ALT 56 Labs (3/15): TSH low, AST 57, ALT 30, digoxin 1.2 Labs (4/15): K 4, creatinine 1.4, LFTs normal Labs (6/15): TSH normal, digoxin level 0. Labs (8/15): K 4.3, creatinine 1.7 => 1.6 => 1.63, AST 75, ALT 70, hgb 11.8, WBCs 9.7 Labs (10/15): K 4.2, creatinine 1.75, AST 93, ALT 60  ECG:  NSR, 1st degree AV block, iRBBB, QTc 494  Allergies (verified):  1) ! Crestor  2) Vytorin  3) Atenolol   Past Medical History:  1. Coronary artery disease: LHC 4/06 with 60% pLAD, 40% ramus, 60% mRCA, and 90% mPDA. No intervention. Inferior akinesis on LV-gram was suggestive of possible inferior MI with recanalization.  2. Hyperlipidemia: Myalgias with Lipitor and Crestor.  3. Hypothyroidism  4. Peripheral vascular disease  5. Anxiety  6. Skin cancer, hx of 2008 BCC  7. Colonic polyps, hx of  8. Atrial arrhythmias (PAF and atrial tachycardia): Paroxysmal atrial fibrillation with rapid ventricular response treated with amiodarone therapy converting to normal sinus rhythm 02/2009.  He has also had atypical atrial flutter versus atrial tachycardia noted in 4/14, cardioverted to NSR in 4/14. In atypical flutter versus atrial tachycardia again in 1/15, DCCV in 1/15 to NSR. Atrial tachycardia in 7/15, 8/15, 9/15 with junctional  escape in 60s or 2:1 block with faster rate. Off digoxin given PAT with block.  PFTs (10/15) mild restriction, no marked abnormalities.  9. Systolic CHF: Probable ischemic cardiomyopathy. Echo (6/10) with EF 40%, mildly dilated LV, moderate MR.  Echo (3/12): EF 30-35%, inferolateral severe hypokinesis, trivial AI, moderate to severe MR.  10. History of PNA  11. CKD. Baseline creatinine around 1.4.  12. Mitral regurgitation: Moderate by echo 6/10. 2-3+ by cath 2006.  Moderate-severe by echo in 3/12.  13. PVCs  14. BPH 15. Recurrent UTIs 16. Imbalance 17. Carotid stenosis: Dopplers (2/15) with 40-59% BICA stenosis.  18. Possible tendinopathy with Cipro  Family History:  Family History of CAD Male 1st degree relative <60   Social History:  Retired  Originally from [place], speaks little [place]  Married  Former Smoker   ROS: All systems reviewed and negative except as per HPI.   Current Outpatient Prescriptions  Medication Sig Dispense Refill  . amiodarone (PACERONE) 200 MG tablet Take 1 tablet (200 mg total) by mouth 2 (two) times daily.  180 tablet  3  . co-enzyme Q-10 30 MG capsule Take 90 mg by mouth daily.      . digoxin (LANOXIN) 0.125 MG tablet Take 0.5 tablets (0.0625 mg total) by mouth every other day.  15 tablet  3  . furosemide (LASIX) 40 MG tablet Take 1 tab in AM and 1/2 tab in PM  45 tablet  3  . levothyroxine (SYNTHROID, LEVOTHROID) 100 MCG tablet Take 1 tablet (100 mcg total) by mouth daily before breakfast.  30 tablet  1  . lisinopril (PRINIVIL,ZESTRIL) 2.5 MG tablet Take 1 tablet (2.5 mg total) by mouth daily.  90 tablet  0  . metoprolol succinate (TOPROL XL) 25 MG 24 hr tablet Take 1 tablet (25 mg total) by mouth daily.  30 tablet  6  . Multiple Vitamin (MULTIVITAMIN) tablet Take 1 tablet by mouth daily.      . Omega-3 Fatty Acids (FISH OIL) 1200 MG CAPS Take 1 capsule (1,200 mg total) by mouth daily.  90 capsule  3  . potassium chloride (K-DUR,KLOR-CON) 10 MEQ  tablet Take 20 mEq by mouth daily.      . pravastatin (PRAVACHOL) 80 MG tablet Take 1 tablet (80 mg total) by mouth daily.  90 tablet  3  . tamsulosin (FLOMAX) 0.4 MG CAPS capsule Take 0.4 mg by mouth daily.      Marland Kitchen warfarin (COUMADIN) 1 MG tablet Take 1-2 tablets (1-2 mg total) by mouth daily. As directed  35 tablet  3  . zolpidem (AMBIEN) 10 MG tablet Take 1 tablet (10 mg total) by mouth at bedtime as needed for sleep.  30 tablet  5   No current facility-administered medications for this encounter.    BP 106/58  Pulse 59  Wt 137 lb 12.8 oz (62.506 kg)  SpO2 99% General: NAD Neck: JVP 7 cm; no thyromegaly or thyroid nodule.  Lungs: Slight crackles at bases.  CV: Nondisplaced PMI.  Heart regular, S1/S2, no S3/S4, 1/6 HSM at apex.  No ankle edema.  Right carotid bruit. Abdomen: Soft, nontender, no hepatosplenomegaly, no distention.  Neurologic: Alert and oriented x 3.  Psych: Normal affect. Extremities: No clubbing or cyanosis.   Assessment/Plan: 1. Atrial arrhythmias  Patient remainsin NSR.  He seems to stay in NSR more at amiodarone 200 mg bid, so I will leave him at this dose.  Recent PFTs did not suggest amiodarone lung toxicity.  Main concern at this point hepatic toxicity given some rise in LFTs.  - Keep amiodarone at 200 mg bid, check LFTs again today.   - Continue current Toprol XL and warfarin.  2. HYPERLIPIDEMIA  He is tolerating pravastatin without myalgias. Had myalgias with Lipitor and Crestor.  LDL not ideal, but will continue current pravastatin as he tolerates it.  If LFTs are up again, however, will stop pravastatin.  3. MITRAL INSUFFICIENCY Last echo showed moderate to severe MR. Given age and medical condition, would plan medical management.  4. SYSTOLIC HEART FAILURE, CHRONIC  EF 30-35% on last echo. NYHA class II symptoms, improved.  He is euvolemic. Feels much better after restarting digoxin.  - Continue Lasix to 40 qam/20 qpm.  Creatinine stable.   - Continue  current lisinopril and Toprol XL.  - Continue digoxin, check level.    5. Carotid stenosis: Moderate bilateral stenosis, repeat study in 2/16.  6. CKD: Creatinine stable.   Loralie Champagne 07/21/2014

## 2014-07-22 ENCOUNTER — Telehealth (HOSPITAL_COMMUNITY): Payer: Self-pay | Admitting: *Deleted

## 2014-07-22 ENCOUNTER — Ambulatory Visit (INDEPENDENT_AMBULATORY_CARE_PROVIDER_SITE_OTHER): Payer: Medicare Other | Admitting: *Deleted

## 2014-07-22 DIAGNOSIS — I4891 Unspecified atrial fibrillation: Secondary | ICD-10-CM

## 2014-07-22 DIAGNOSIS — Z5181 Encounter for therapeutic drug level monitoring: Secondary | ICD-10-CM

## 2014-07-22 LAB — POCT INR: INR: 2.6

## 2014-07-22 NOTE — Telephone Encounter (Signed)
Pt's daughter aware and will have pt stop medication, she states she would like to see what his repeat labs are before referring him to GI, repeat lab sch for 11/10

## 2014-07-22 NOTE — Telephone Encounter (Signed)
Message copied by Scarlette Calico on Thu Jul 22, 2014  2:48 PM ------      Message from: Larey Dresser      Created: Wed Jul 21, 2014  4:46 PM       Patient's LFTs are higher.        1. Stop pravastatin, repeat LFTs in 2 weeks.       2. Refer to GI for asap appt for rising LFTs.  Would like to continue amiodarone if possible as he does very poorly with atrial arrhythmias.   ------

## 2014-07-23 ENCOUNTER — Ambulatory Visit
Admission: RE | Admit: 2014-07-23 | Discharge: 2014-07-23 | Disposition: A | Discharge status: Home / Self Care | Payer: Medicare Other | Source: Ambulatory Visit | Attending: Urology | Admitting: Urology

## 2014-07-23 DIAGNOSIS — N281 Cyst of kidney, acquired: Secondary | ICD-10-CM

## 2014-07-28 ENCOUNTER — Other Ambulatory Visit: Payer: Self-pay | Admitting: Family Medicine

## 2014-08-03 ENCOUNTER — Encounter: Payer: Self-pay | Admitting: Cardiology

## 2014-08-03 ENCOUNTER — Ambulatory Visit (HOSPITAL_COMMUNITY)
Admission: RE | Admit: 2014-08-03 | Discharge: 2014-08-03 | Disposition: A | Discharge status: Home / Self Care | Payer: Medicare Other | Source: Ambulatory Visit | Attending: Cardiology | Admitting: Cardiology

## 2014-08-03 ENCOUNTER — Telehealth (HOSPITAL_COMMUNITY): Payer: Self-pay | Admitting: Vascular Surgery

## 2014-08-03 ENCOUNTER — Other Ambulatory Visit: Payer: Self-pay | Admitting: Cardiology

## 2014-08-03 DIAGNOSIS — I5022 Chronic systolic (congestive) heart failure: Secondary | ICD-10-CM | POA: Insufficient documentation

## 2014-08-03 LAB — HEPATIC FUNCTION PANEL
ALT: 67 U/L — ABNORMAL HIGH (ref 0–53)
AST: 97 U/L — ABNORMAL HIGH (ref 0–37)
Albumin: 3.2 g/dL — ABNORMAL LOW (ref 3.5–5.2)
Alkaline Phosphatase: 62 U/L (ref 39–117)
Bilirubin, Direct: <0.2 mg/dL (ref 0.0–0.3)
Total Bilirubin: 0.5 mg/dL (ref 0.3–1.2)
Total Protein: 7.1 g/dL (ref 6.0–8.3)

## 2014-08-03 NOTE — Telephone Encounter (Signed)
Pt daughter called about labs results seemed abnormal she needs some clarification on the results.. Please advise

## 2014-08-03 NOTE — Telephone Encounter (Signed)
Dr Aundra Dubin spoke w/pt's daughter

## 2014-08-11 ENCOUNTER — Telehealth (HOSPITAL_COMMUNITY): Payer: Self-pay | Admitting: Vascular Surgery

## 2014-08-11 ENCOUNTER — Ambulatory Visit (INDEPENDENT_AMBULATORY_CARE_PROVIDER_SITE_OTHER): Payer: Medicare Other | Admitting: Family Medicine

## 2014-08-11 VITALS — BP 100/58 | HR 63 | Temp 98.1°F | Resp 16 | Ht 62.5 in | Wt 137.0 lb

## 2014-08-11 DIAGNOSIS — I480 Paroxysmal atrial fibrillation: Secondary | ICD-10-CM

## 2014-08-11 DIAGNOSIS — R338 Other retention of urine: Secondary | ICD-10-CM

## 2014-08-11 DIAGNOSIS — N401 Enlarged prostate with lower urinary tract symptoms: Secondary | ICD-10-CM

## 2014-08-11 DIAGNOSIS — N400 Enlarged prostate without lower urinary tract symptoms: Secondary | ICD-10-CM

## 2014-08-11 DIAGNOSIS — I2510 Atherosclerotic heart disease of native coronary artery without angina pectoris: Secondary | ICD-10-CM

## 2014-08-11 DIAGNOSIS — R5382 Chronic fatigue, unspecified: Secondary | ICD-10-CM

## 2014-08-11 DIAGNOSIS — R5081 Fever presenting with conditions classified elsewhere: Secondary | ICD-10-CM

## 2014-08-11 DIAGNOSIS — I5022 Chronic systolic (congestive) heart failure: Secondary | ICD-10-CM

## 2014-08-11 LAB — POCT URINALYSIS DIPSTICK
Bilirubin, UA: NEGATIVE
Glucose, UA: NEGATIVE
Ketones, UA: NEGATIVE
Nitrite, UA: POSITIVE
Protein, UA: NEGATIVE
Spec Grav, UA: <=1.005
Urobilinogen, UA: 0.2
pH, UA: 5

## 2014-08-11 MED ORDER — CEPHALEXIN 500 MG PO CAPS: 500.0000 mg | ORAL_CAPSULE | Freq: Three times a day (TID) | ORAL | Status: DC

## 2014-08-11 NOTE — Progress Notes (Signed)
Patient ID: Eula Mazzola MRN: [id number], DOB: May 01, 1926, 80 y.o. Date of Encounter: 08/11/2014, 6:47 PM  This chart was scribed for Dr. [name], MD by Erling Conte, Medical Scribe. This patient was seen in Room 14 and the patient's care was started at 6:47 PM.  Primary Physician: [name], MD  Chief Complaint:  Chief Complaint  Patient presents with  . Fever    since today   . Fatigue    HPI: 80 y.o. year old male with history below presents with a subjective fever onset this morning. He is having associated fatigue and dizziness. Pt has a urostomy bag. Daughter states that he has not been wanting to eat very much. She notes his liver function is not good but it has stabilized due to a supplement he is taking. Pt denies any cough, rhinorrhea, congestion, abdominal pain, sore throat, nausea, vomiting or diarrhea.    Past Medical History  Diagnosis Date  . CAD (coronary artery disease)     a. LHC 4/06 with 60% pLAD, 40% ramus, 60% mRCA, and 90% mPDA. No intervention. Inferior akinesis on LV-gram was suggestive of possible inferior MI with recanalization.  . Hyperlipidemia     a. Myalgias with Lipitor and Crestor.  . Hypothyroidism   . Peripheral vascular disease   . Anxiety   . Hx of colonic polyps   . Paroxysmal atrial fibrillation 02/2009     a. With RVR tx with amiodarone returning to NSR 02/2009. b. Had atypical aflutter vs atrial tach 4/14, converted to NSR 4/14. In atypical flutter vs atrial tach in 1/15, DCCV 1/15 to NSR. c. Atrial tach 7/15 with junctional escape 60s.  . Systolic CHF     a.  Probable ischemic cardiomyopathy. Echo (6/10) with EF 40%, mildly dilated LV, moderate MR. ;  b.  echo 3/12: mild LVH, EF 30-35%, IL HK and Lat HK, mod to severe MR, trivial AI, mod LAE  . PNA (pneumonia)     History of   . Mitral regurgitation     a. Moderate by echo 6/10. b. 2-3+ by cath 2006; mod to severe by echo 3/12  . PVC's (premature ventricular  contractions)   . Cataract   . Heart murmur   . CKD (chronic kidney disease), stage III   . Skin cancer     Hx of 2008 BCC  . Chronic indwelling Foley catheter     per family, bladder muscles no longer work  . Depression   . Myocardial infarction   . Basal cell carcinoma   . Paroxysmal atrial tachycardia     a. See details under PAF.  Marland Kitchen Paroxysmal atrial flutter     a. See details under PAF.  Marland Kitchen BPH (benign prostatic hyperplasia)   . Recurrent UTI   . Imbalance   . Carotid stenosis     a. Dopplers 2/15 - 40-59% BICA.     Home Meds: Prior to Admission medications   Medication Sig Start Date End Date Taking? Authorizing Provider  amiodarone (PACERONE) 200 MG tablet Take 1 tablet (200 mg total) by mouth 2 (two) times daily. 06/15/14  Yes Larey Dresser, MD  co-enzyme Q-10 30 MG capsule Take 90 mg by mouth daily. 08/27/13  Yes [name], MD  digoxin (LANOXIN) 0.125 MG tablet Take 0.5 tablets (0.0625 mg total) by mouth every other day. 07/05/14  Yes Larey Dresser, MD  furosemide (LASIX) 40 MG tablet Take 1 tab in AM and 1/2 tab in PM 07/05/14  Yes  Larey Dresser, MD  levothyroxine (SYNTHROID, LEVOTHROID) 100 MCG tablet Take 1 tablet (100 mcg total) by mouth daily before breakfast. 07/13/14  Yes Mancel Bale, PA-C  lisinopril (PRINIVIL,ZESTRIL) 2.5 MG tablet Take 1 tablet (2.5 mg total) by mouth daily. 06/21/14  Yes Larey Dresser, MD  metoprolol succinate (TOPROL XL) 25 MG 24 hr tablet Take 1 tablet (25 mg total) by mouth daily. 07/20/14  Yes Larey Dresser, MD  Multiple Vitamin (MULTIVITAMIN) tablet Take 1 tablet by mouth daily.   Yes Historical Provider, MD  Omega-3 Fatty Acids (FISH OIL) 1200 MG CAPS Take 1 capsule (1,200 mg total) by mouth daily. 08/27/13  Yes [name], MD  potassium chloride (K-DUR,KLOR-CON) 10 MEQ tablet Take 20 mEq by mouth daily.   Yes Historical Provider, MD  tamsulosin (FLOMAX) 0.4 MG CAPS capsule Take 0.4 mg by mouth daily. 12/24/13  Yes [name], MD  warfarin (COUMADIN) 1 MG tablet Take 1-2 tablets (1-2 mg total) by mouth daily. As directed 07/08/14  Yes Larey Dresser, MD  zolpidem (AMBIEN) 10 MG tablet TAKE 1 TABLET BY MOUTH AT BEDTIME AS NEEDED FOR SLEEP 07/22/14  Yes [name], MD    Allergies:  Allergies  Allergen Reactions  . Atenolol     REACTION: brady  . Ciprofloxacin     Possible tendinopathy   . Ezetimibe-Simvastatin     REACTION: spasms  . Rosuvastatin     History   Social History  . Marital Status: Married    Spouse Name: N/A    Number of Children: N/A  . Years of Education: N/A   Occupational History  . Not on file.   Social History Main Topics  . Smoking status: Never Smoker   . Smokeless tobacco: Not on file  . Alcohol Use: No  . Drug Use: No  . Sexual Activity: No   Other Topics Concern  . Not on file   Social History Narrative   Retired. Originally from [place], speaks little [place]. Married. Daughter- Leota Sauers, her cell number is [phone]      Review of Systems: Constitutional: positive for subjective fever, chills, and fatigue. negative for night sweats, weight changes. HEENT: negative for vision changes, hearing loss, congestion, rhinorrhea, ST, epistaxis, or sinus pressure Cardiovascular: negative for chest pain or palpitations Respiratory: negative for hemoptysis, wheezing, shortness of breath, or cough Abdominal: negative for abdominal pain, nausea, vomiting, diarrhea, or constipation Dermatological: negative for rash Neurologic: positive for dizziness. negative for headache or syncope All other systems reviewed and are otherwise negative with the exception to those above and in the HPI.   Physical Exam: Blood pressure 100/58, pulse 63, temperature 98.1 F (36.7 C), temperature source Oral, resp. rate 16, height 5' 2.5" (1.588 m), weight 137 lb (62.143 kg), SpO2 96 %., Body mass index is 24.64 kg/(m^2). General: Well developed, well nourished, in no  acute distress. Head: Normocephalic, atraumatic, eyes without discharge, sclera non-icteric, nares are without discharge. Bilateral auditory canals clear, TM's are without perforation, pearly grey and translucent with reflective cone of light bilaterally. Oral cavity moist, posterior pharynx without exudate, erythema, peritonsillar abscess, or post nasal drip.  Neck: Supple. No thyromegaly. Full ROM. No lymphadenopathy. Lungs: Clear bilaterally to auscultation without wheezes, rales, or rhonchi. Breathing is unlabored. Heart: RRR with S1 S2. No murmurs, rubs, or gallops appreciated. Abdomen: Soft, non-tender, non-distended with normoactive bowel sounds. No hepatomegaly. No rebound/guarding. No obvious abdominal masses.   Msk:  Strength and tone normal for age.  Extremities/Skin: Warm and dry. No clubbing or cyanosis. No edema. No rashes or suspicious lesions. Neuro: Alert and oriented X 3. Moves all extremities spontaneously. Gait is normal. CNII-XII grossly in tact. Psych:  Responds to questions appropriately with a normal affect.   Labs:  Results for orders placed or performed during the hospital encounter of 08/03/14  Hepatic function panel  Result Value Ref Range   Total Protein 7.1 6.0 - 8.3 g/dL   Albumin 3.2 (L) 3.5 - 5.2 g/dL   AST 97 (H) 0 - 37 U/L   ALT 67 (H) 0 - 53 U/L   Alkaline Phosphatase 62 39 - 117 U/L   Total Bilirubin 0.5 0.3 - 1.2 mg/dL   Bilirubin, Direct <0.2 0.0 - 0.3 mg/dL   Indirect Bilirubin NOT CALCULATED 0.3 - 0.9 mg/dL      ASSESSMENT AND PLAN:  81 y.o. year old male with   1. Fever presenting with conditions classified elsewhere   2. Enlarged prostate with urinary retention    Meds ordered this encounter  Medications  . cephALEXin (KEFLEX) 500 MG capsule    Sig: Take 1 capsule (500 mg total) by mouth 3 (three) times daily.    Dispense:  21 capsule    Refill:  1     I personally performed the services described in this documentation, which was  scribed in my presence. The recorded information has been reviewed and is accurate.   Signed, [name], MD 08/11/2014 7:10 PM

## 2014-08-11 NOTE — Telephone Encounter (Signed)
Pt need prescription with  dosage changes for Torsemide

## 2014-08-11 NOTE — Patient Instructions (Signed)
I believe you have a urinary tract infection.  Lab tests are pending for tomorrow

## 2014-08-12 LAB — CBC
HCT: 36.3 % — ABNORMAL LOW (ref 39.0–52.0)
Hemoglobin: 12.4 g/dL — ABNORMAL LOW (ref 13.0–17.0)
MCH: 30.2 pg (ref 26.0–34.0)
MCHC: 34.2 g/dL (ref 30.0–36.0)
MCV: 88.3 fL (ref 78.0–100.0)
MPV: 10.4 fL (ref 9.4–12.4)
Platelets: 162 10*3/uL (ref 150–400)
RBC: 4.11 MIL/uL — ABNORMAL LOW (ref 4.22–5.81)
RDW: 14.9 % (ref 11.5–15.5)
WBC: 9.6 10*3/uL (ref 4.0–10.5)

## 2014-08-12 LAB — COMPREHENSIVE METABOLIC PANEL
ALT: 49 U/L (ref 0–53)
AST: 60 U/L — ABNORMAL HIGH (ref 0–37)
Albumin: 3.7 g/dL (ref 3.5–5.2)
Alkaline Phosphatase: 60 U/L (ref 39–117)
BUN: 18 mg/dL (ref 6–23)
CO2: 25 mEq/L (ref 19–32)
Calcium: 8.5 mg/dL (ref 8.4–10.5)
Chloride: 100 mEq/L (ref 96–112)
Creat: 1.42 mg/dL — ABNORMAL HIGH (ref 0.50–1.35)
Glucose, Bld: 109 mg/dL — ABNORMAL HIGH (ref 70–99)
Potassium: 4.3 mEq/L (ref 3.5–5.3)
Sodium: 135 mEq/L (ref 135–145)
Total Bilirubin: 0.9 mg/dL (ref 0.2–1.2)
Total Protein: 7 g/dL (ref 6.0–8.3)

## 2014-08-12 MED ORDER — FUROSEMIDE 40 MG PO TABS: ORAL_TABLET | ORAL | Status: DC

## 2014-08-12 NOTE — Telephone Encounter (Signed)
As requested refills returned to pharmacy for FUROSEMIDE as pt is not in TORSEMIDE

## 2014-08-15 LAB — URINE CULTURE: Colony Count: >=100000

## 2014-08-16 ENCOUNTER — Ambulatory Visit (INDEPENDENT_AMBULATORY_CARE_PROVIDER_SITE_OTHER): Payer: Medicare Other | Admitting: *Deleted

## 2014-08-16 DIAGNOSIS — Z5181 Encounter for therapeutic drug level monitoring: Secondary | ICD-10-CM

## 2014-08-16 DIAGNOSIS — I4891 Unspecified atrial fibrillation: Secondary | ICD-10-CM

## 2014-08-16 LAB — POCT INR: INR: 1.5

## 2014-08-19 ENCOUNTER — Other Ambulatory Visit: Payer: Self-pay | Admitting: Family Medicine

## 2014-08-19 DIAGNOSIS — N390 Urinary tract infection, site not specified: Secondary | ICD-10-CM

## 2014-08-19 DIAGNOSIS — T83511D Infection and inflammatory reaction due to indwelling urethral catheter, subsequent encounter: Principal | ICD-10-CM

## 2014-08-19 MED ORDER — SULFAMETHOXAZOLE-TRIMETHOPRIM 400-80 MG PO TABS: 1.0000 | ORAL_TABLET | Freq: Two times a day (BID) | ORAL | Status: DC

## 2014-08-19 MED ORDER — AMOXICILLIN-POT CLAVULANATE 875-125 MG PO TABS: 1.0000 | ORAL_TABLET | Freq: Two times a day (BID) | ORAL | Status: DC

## 2014-08-20 ENCOUNTER — Inpatient Hospital Stay (HOSPITAL_COMMUNITY)
Admission: EM | Admit: 2014-08-20 | Discharge: 2014-08-24 | DRG: 470 | Disposition: A | Discharge status: Skilled Nursing Facility | Payer: Medicare Other | Attending: Family Medicine | Admitting: Family Medicine

## 2014-08-20 ENCOUNTER — Encounter: Payer: Self-pay | Admitting: Cardiology

## 2014-08-20 ENCOUNTER — Emergency Department (HOSPITAL_COMMUNITY): Payer: Medicare Other

## 2014-08-20 ENCOUNTER — Encounter (HOSPITAL_COMMUNITY): Payer: Self-pay | Admitting: *Deleted

## 2014-08-20 DIAGNOSIS — R319 Hematuria, unspecified: Secondary | ICD-10-CM | POA: Diagnosis not present

## 2014-08-20 DIAGNOSIS — Z87440 Personal history of urinary (tract) infections: Secondary | ICD-10-CM | POA: Diagnosis not present

## 2014-08-20 DIAGNOSIS — F419 Anxiety disorder, unspecified: Secondary | ICD-10-CM | POA: Diagnosis present

## 2014-08-20 DIAGNOSIS — I4719 Other supraventricular tachycardia: Secondary | ICD-10-CM | POA: Diagnosis present

## 2014-08-20 DIAGNOSIS — E785 Hyperlipidemia, unspecified: Secondary | ICD-10-CM | POA: Diagnosis present

## 2014-08-20 DIAGNOSIS — N390 Urinary tract infection, site not specified: Secondary | ICD-10-CM | POA: Diagnosis present

## 2014-08-20 DIAGNOSIS — D72829 Elevated white blood cell count, unspecified: Secondary | ICD-10-CM | POA: Diagnosis not present

## 2014-08-20 DIAGNOSIS — B9620 Unspecified Escherichia coli [E. coli] as the cause of diseases classified elsewhere: Secondary | ICD-10-CM | POA: Diagnosis present

## 2014-08-20 DIAGNOSIS — G4700 Insomnia, unspecified: Secondary | ICD-10-CM | POA: Diagnosis present

## 2014-08-20 DIAGNOSIS — I2510 Atherosclerotic heart disease of native coronary artery without angina pectoris: Secondary | ICD-10-CM | POA: Diagnosis present

## 2014-08-20 DIAGNOSIS — I252 Old myocardial infarction: Secondary | ICD-10-CM

## 2014-08-20 DIAGNOSIS — F329 Major depressive disorder, single episode, unspecified: Secondary | ICD-10-CM | POA: Diagnosis present

## 2014-08-20 DIAGNOSIS — M25551 Pain in right hip: Secondary | ICD-10-CM | POA: Diagnosis present

## 2014-08-20 DIAGNOSIS — W19XXXA Unspecified fall, initial encounter: Secondary | ICD-10-CM | POA: Insufficient documentation

## 2014-08-20 DIAGNOSIS — W010XXA Fall on same level from slipping, tripping and stumbling without subsequent striking against object, initial encounter: Secondary | ICD-10-CM | POA: Diagnosis present

## 2014-08-20 DIAGNOSIS — N401 Enlarged prostate with lower urinary tract symptoms: Secondary | ICD-10-CM | POA: Diagnosis present

## 2014-08-20 DIAGNOSIS — E039 Hypothyroidism, unspecified: Secondary | ICD-10-CM | POA: Diagnosis present

## 2014-08-20 DIAGNOSIS — N183 Chronic kidney disease, stage 3 unspecified: Secondary | ICD-10-CM | POA: Diagnosis present

## 2014-08-20 DIAGNOSIS — Z7901 Long term (current) use of anticoagulants: Secondary | ICD-10-CM

## 2014-08-20 DIAGNOSIS — S72011A Unspecified intracapsular fracture of right femur, initial encounter for closed fracture: Principal | ICD-10-CM | POA: Diagnosis present

## 2014-08-20 DIAGNOSIS — I480 Paroxysmal atrial fibrillation: Secondary | ICD-10-CM | POA: Diagnosis present

## 2014-08-20 DIAGNOSIS — I129 Hypertensive chronic kidney disease with stage 1 through stage 4 chronic kidney disease, or unspecified chronic kidney disease: Secondary | ICD-10-CM | POA: Diagnosis present

## 2014-08-20 DIAGNOSIS — I471 Supraventricular tachycardia: Secondary | ICD-10-CM | POA: Diagnosis not present

## 2014-08-20 DIAGNOSIS — Z96641 Presence of right artificial hip joint: Secondary | ICD-10-CM | POA: Insufficient documentation

## 2014-08-20 DIAGNOSIS — I739 Peripheral vascular disease, unspecified: Secondary | ICD-10-CM | POA: Diagnosis present

## 2014-08-20 DIAGNOSIS — B961 Klebsiella pneumoniae [K. pneumoniae] as the cause of diseases classified elsewhere: Secondary | ICD-10-CM | POA: Diagnosis present

## 2014-08-20 DIAGNOSIS — S72001A Fracture of unspecified part of neck of right femur, initial encounter for closed fracture: Secondary | ICD-10-CM | POA: Insufficient documentation

## 2014-08-20 DIAGNOSIS — J189 Pneumonia, unspecified organism: Secondary | ICD-10-CM

## 2014-08-20 DIAGNOSIS — I5022 Chronic systolic (congestive) heart failure: Secondary | ICD-10-CM | POA: Diagnosis present

## 2014-08-20 DIAGNOSIS — I340 Nonrheumatic mitral (valve) insufficiency: Secondary | ICD-10-CM | POA: Diagnosis present

## 2014-08-20 DIAGNOSIS — C4490 Unspecified malignant neoplasm of skin, unspecified: Secondary | ICD-10-CM | POA: Diagnosis present

## 2014-08-20 LAB — BASIC METABOLIC PANEL
Anion gap: 14 (ref 5–15)
BUN: 35 mg/dL — ABNORMAL HIGH (ref 6–23)
CALCIUM: 8.4 mg/dL (ref 8.4–10.5)
CO2: 24 mEq/L (ref 19–32)
CREATININE: 2.03 mg/dL — AB (ref 0.50–1.35)
Chloride: 99 mEq/L (ref 96–112)
GFR calc Af Amer: 32 mL/min — ABNORMAL LOW (ref >90)
GFR, EST NON AFRICAN AMERICAN: 28 mL/min — AB (ref >90)
GLUCOSE: 116 mg/dL — AB (ref 70–99)
Potassium: 4.5 mEq/L (ref 3.7–5.3)
SODIUM: 137 meq/L (ref 137–147)

## 2014-08-20 LAB — CBC WITH DIFFERENTIAL/PLATELET
Basophils Absolute: 0 10*3/uL (ref 0.0–0.1)
Basophils Relative: 0 % (ref 0–1)
EOS ABS: 0 10*3/uL (ref 0.0–0.7)
EOS PCT: 0 % (ref 0–5)
HCT: 37.1 % — ABNORMAL LOW (ref 39.0–52.0)
Hemoglobin: 12.1 g/dL — ABNORMAL LOW (ref 13.0–17.0)
LYMPHS PCT: 24 % (ref 12–46)
Lymphs Abs: 2.3 10*3/uL (ref 0.7–4.0)
MCH: 30.1 pg (ref 26.0–34.0)
MCHC: 32.6 g/dL (ref 30.0–36.0)
MCV: 92.3 fL (ref 78.0–100.0)
MONO ABS: 1 10*3/uL (ref 0.1–1.0)
Monocytes Relative: 10 % (ref 3–12)
Neutro Abs: 6.4 10*3/uL (ref 1.7–7.7)
Neutrophils Relative %: 66 % (ref 43–77)
PLATELETS: 200 10*3/uL (ref 150–400)
RBC: 4.02 MIL/uL — ABNORMAL LOW (ref 4.22–5.81)
RDW: 15 % (ref 11.5–15.5)
WBC: 9.7 10*3/uL (ref 4.0–10.5)

## 2014-08-20 LAB — ABO/RH: ABO/RH(D): A POS

## 2014-08-20 LAB — PROTIME-INR
INR: 1.67 — ABNORMAL HIGH (ref 0.00–1.49)
PROTHROMBIN TIME: 19.9 s — AB (ref 11.6–15.2)

## 2014-08-20 LAB — TYPE AND SCREEN
ABO/RH(D): A POS
ANTIBODY SCREEN: NEGATIVE

## 2014-08-20 MED ORDER — AMIODARONE HCL 200 MG PO TABS
200.0000 mg | ORAL_TABLET | Freq: Every day | ORAL | Status: DC
  Filled 2014-08-20: qty 1

## 2014-08-20 NOTE — ED Notes (Signed)
Pt in after a fall today c/o right hip and leg pain, unable to ambulate, alert

## 2014-08-20 NOTE — ED Provider Notes (Signed)
CSN: [id number]     Arrival date & time 08/20/14  1904 History   First MD Initiated Contact with Patient 08/20/14 1911     Chief Complaint  Patient presents with  . Fall     (Consider location/radiation/quality/duration/timing/severity/associated sxs/prior Treatment) Patient is a 80 y.o. male presenting with fall.  Fall This is a new problem. The current episode started today. The problem occurs constantly. The problem has been unchanged. Pertinent negatives include no abdominal pain, chills, fatigue or fever. Nothing aggravates the symptoms. He has tried nothing for the symptoms.    Past Medical History  Diagnosis Date  . CAD (coronary artery disease)     a. LHC 4/06 with 60% pLAD, 40% ramus, 60% mRCA, and 90% mPDA. No intervention. Inferior akinesis on LV-gram was suggestive of possible inferior MI with recanalization.  . Hyperlipidemia     a. Myalgias with Lipitor and Crestor.  . Hypothyroidism   . Peripheral vascular disease   . Anxiety   . Hx of colonic polyps   . Paroxysmal atrial fibrillation 02/2009     a. With RVR tx with amiodarone returning to NSR 02/2009. b. Had atypical aflutter vs atrial tach 4/14, converted to NSR 4/14. In atypical flutter vs atrial tach in 1/15, DCCV 1/15 to NSR. c. Atrial tach 7/15 with junctional escape 60s.  . Systolic CHF     a.  Probable ischemic cardiomyopathy. Echo (6/10) with EF 40%, mildly dilated LV, moderate MR. ;  b.  echo 3/12: mild LVH, EF 30-35%, IL HK and Lat HK, mod to severe MR, trivial AI, mod LAE  . PNA (pneumonia)     History of   . Mitral regurgitation     a. Moderate by echo 6/10. b. 2-3+ by cath 2006; mod to severe by echo 3/12  . PVC's (premature ventricular contractions)   . Cataract   . Heart murmur   . CKD (chronic kidney disease), stage III   . Skin cancer     Hx of 2008 BCC  . Chronic indwelling Foley catheter     per family, bladder muscles no longer work  . Depression   . Myocardial infarction   . Basal cell  carcinoma   . Paroxysmal atrial tachycardia     a. See details under PAF.  Marland Kitchen Paroxysmal atrial flutter     a. See details under PAF.  Marland Kitchen BPH (benign prostatic hyperplasia)   . Recurrent UTI   . Imbalance   . Carotid stenosis     a. Dopplers 2/15 - 40-59% BICA.   Past Surgical History  Procedure Laterality Date  . Skin cancer excision  2008    shoulder and nose   . Cardioversion N/A 01/02/2013    Procedure: CARDIOVERSION;  Surgeon: Larey Dresser, MD;  Location: [facility];  Service: Cardiovascular;  Laterality: N/A;  . Cardioversion N/A 10/23/2013    Procedure: CARDIOVERSION;  Surgeon: Larey Dresser, MD;  Location: [facility] ENDOSCOPY;  Service: Cardiovascular;  Laterality: N/A;   Family History  Problem Relation Age of Onset  . Coronary artery disease Other     male, 1st degree relative <60   . Heart disease Mother   . Heart attack Mother    History  Substance Use Topics  . Smoking status: Never Smoker   . Smokeless tobacco: Not on file  . Alcohol Use: No    Review of Systems  Constitutional: Negative for fever, chills and fatigue.  Gastrointestinal: Negative for abdominal pain.  Musculoskeletal:  Fall and right leg pain when bearing weight.   All other systems reviewed and are negative.     Allergies  Ciprofloxacin; Ezetimibe-simvastatin; Rosuvastatin; and Atenolol  Home Medications   Prior to Admission medications   Medication Sig Start Date End Date Taking? Authorizing Provider  amiodarone (PACERONE) 200 MG tablet Take 1 tablet (200 mg total) by mouth 2 (two) times daily. 06/15/14  Yes Larey Dresser, MD  amoxicillin-clavulanate (AUGMENTIN) 875-125 MG per tablet Take 1 tablet by mouth 2 (two) times daily. Patient taking differently: Take 1 tablet by mouth 2 (two) times daily. 10 day course started 08/19/14 pm 08/19/14  Yes [name], MD  Coenzyme Q10 (COQ10 PO) Take 1 tablet by mouth daily.   Yes Historical Provider, MD  digoxin (LANOXIN) 0.125 MG  tablet Take 0.5 tablets (0.0625 mg total) by mouth every other day. Patient taking differently: Take 0.0625 mg by mouth every other day. Takes at noon 07/05/14  Yes Larey Dresser, MD  furosemide (LASIX) 40 MG tablet Take 1 tab in AM and 1/2 tab in PM Patient taking differently: Take 20-40 mg by mouth 2 (two) times daily. Take 1 tablet (40 mg) every morning and 1/2 tablet (20 mg) every night 08/12/14  Yes Jolaine Artist, MD  levothyroxine (SYNTHROID, LEVOTHROID) 100 MCG tablet Take 1 tablet (100 mcg total) by mouth daily before breakfast. 07/13/14  Yes Mancel Bale, PA-C  lisinopril (PRINIVIL,ZESTRIL) 2.5 MG tablet Take 1 tablet (2.5 mg total) by mouth daily. Patient taking differently: Take 2.5 mg by mouth daily at 3 pm.  06/21/14  Yes Larey Dresser, MD  metoprolol succinate (TOPROL XL) 25 MG 24 hr tablet Take 1 tablet (25 mg total) by mouth daily. 07/20/14  Yes Larey Dresser, MD  MILK THISTLE PO Take 325 mg by mouth 3 (three) times daily.   Yes Historical Provider, MD  Multiple Vitamin (MULTIVITAMIN WITH MINERALS) TABS tablet Take 1 tablet by mouth daily.   Yes Historical Provider, MD  Omega-3 Fatty Acids (FISH OIL) 1200 MG CAPS Take 1 capsule (1,200 mg total) by mouth daily. 08/27/13  Yes [name], MD  potassium chloride (K-DUR,KLOR-CON) 10 MEQ tablet Take 20 mEq by mouth daily.   Yes Historical Provider, MD  tamsulosin (FLOMAX) 0.4 MG CAPS capsule Take 0.4 mg by mouth at bedtime.  12/24/13  Yes [name], MD  warfarin (COUMADIN) 1 MG tablet Take 1-2 tablets (1-2 mg total) by mouth daily. As directed Patient taking differently: Take 1 mg by mouth every evening. 5pm 07/08/14  Yes Larey Dresser, MD  zolpidem (AMBIEN) 10 MG tablet Take 10 mg by mouth at bedtime.   Yes Historical Provider, MD  zolpidem (AMBIEN) 10 MG tablet TAKE 1 TABLET BY MOUTH AT BEDTIME AS NEEDED FOR SLEEP Patient not taking: Reported on 08/20/2014 07/22/14   [name], MD   BP 113/66 mmHg  Pulse 78   Temp(Src) 98.5 F (36.9 C) (Oral)  Resp 29  SpO2 96% Physical Exam  Constitutional: He is oriented to person, place, and time. He appears well-developed and well-nourished.  HENT:  Head: Normocephalic and atraumatic.  Eyes: Conjunctivae and EOM are normal. Pupils are equal, round, and reactive to light.  Neck: Normal range of motion.  Cardiovascular: Normal rate and regular rhythm.   Pulmonary/Chest: Effort normal and breath sounds normal.  Abdominal: Soft. He exhibits no distension. There is no tenderness.  Musculoskeletal: Normal range of motion. He exhibits no edema or tenderness.  Right leg shorter  than left  Neurological: He is alert and oriented to person, place, and time.  Skin: Skin is warm and dry.  Nursing note and vitals reviewed.   ED Course  Procedures (including critical care time) Labs Review Labs Reviewed  BASIC METABOLIC PANEL - Abnormal; Notable for the following:    Glucose, Bld 116 (*)    BUN 35 (*)    Creatinine, Ser 2.03 (*)    GFR calc non Af Amer 28 (*)    GFR calc Af Amer 32 (*)    All other components within normal limits  CBC WITH DIFFERENTIAL - Abnormal; Notable for the following:    RBC 4.02 (*)    Hemoglobin 12.1 (*)    HCT 37.1 (*)    All other components within normal limits  PROTIME-INR - Abnormal; Notable for the following:    Prothrombin Time 19.9 (*)    INR 1.67 (*)    All other components within normal limits  URINE CULTURE  URINALYSIS, ROUTINE W REFLEX MICROSCOPIC  TYPE AND SCREEN  ABO/RH    Imaging Review Dg Pelvis 1-2 Views  08/20/2014   CLINICAL DATA:  Fall  EXAM: PELVIS - 1-2 VIEW  COMPARISON:  None.  FINDINGS: Right femoral neck fracture with mild displacement. Hip joint appears normal bilaterally. No other fracture identified.  IMPRESSION: Right femoral neck fracture.   Electronically Signed   By: Franchot Gallo M.D.   On: 08/20/2014 20:20   Dg Wrist Complete Right  08/20/2014   CLINICAL DATA:  Acute right wrist pain  after fall.  EXAM: RIGHT WRIST - COMPLETE 3+ VIEW  COMPARISON:  None.  FINDINGS: There is no evidence of fracture or dislocation. Narrowing and sclerosis is seen involving the joint space between the scaphoid and trapezium, as well as the first carpometacarpal joint. Soft tissues are unremarkable.  IMPRESSION: Findings consistent with osteoarthritis as described above. No acute abnormality seen in the right wrist.   Electronically Signed   By: Sabino Dick M.D.   On: 08/20/2014 20:23   Dg Femur Right  08/20/2014   CLINICAL DATA:  Fall with right hip pain.  EXAM: RIGHT FEMUR - 2 VIEW  COMPARISON:  None.  FINDINGS: There is a fracture of the proximal right femur. Findings are consistent with a subcapital fracture. Mild elevation of the distal fragment. The right femoral head is located. No acute bone abnormality in the mid and distal femur.  IMPRESSION: Subcapital right femoral fracture.   Electronically Signed   By: Markus Daft M.D.   On: 08/20/2014 20:22   Ct Head Wo Contrast  08/20/2014   CLINICAL DATA:  Fall  EXAM: CT HEAD WITHOUT CONTRAST  TECHNIQUE: Contiguous axial images were obtained from the base of the skull through the vertex without intravenous contrast.  COMPARISON:  None.  FINDINGS: Moderate atrophy. Chronic microvascular ischemic changes in the white matter. Chronic infarct right cerebellum.  Negative for acute infarct. Negative for hemorrhage or mass. No shift of the midline structures. Negative for fracture.  IMPRESSION: Atrophy and chronic ischemia.  No acute abnormality.   Electronically Signed   By: Franchot Gallo M.D.   On: 08/20/2014 20:37     EKG Interpretation None      MDM   Final diagnoses:  Fall  Closed right hip fracture, initial encounter    80 yo M w/ mechanical fall, on coumadin, xr's with hip fracture, intact pulse, head ct negative. Ortho consulted, family medicine to admit. Pain controlled, not wanting narcotic pain  medications at this time.     Merrily Pew, MD 08/21/14 [id number]  Varney Biles, MD 08/21/14 [id number]

## 2014-08-20 NOTE — H&P (Signed)
[facility] Admission History and Physical Service Pager: [phone]  Patient name: [name] Medical record number: [id number] Date of birth: 1925-12-19 Age: 80 y.o. Gender: male  Primary Care Provider: [name], MD Consultants: Orthopedic surgery  Code Status: Full  Chief Complaint: Leg pain  Assessment and Plan: [name] is a 80 y.o. male presenting with femur fracture . PMH is significant for HTN, HLD, hypothyroidism, BPH, CAD with mitral insufficiency, chronic systolic CHF, peripheral vascular disease, paroxysmal atrial flutter/fib, and CKD stage III.   Right femoral fracture - Admit to telemetry - Orthopedics consult in the ER, appreciate recommendations and management - Physical therapy per or thin recommendations when he is ready - On warfarin for A. fib, transition to heparin drip in anticipation of surgery - INR 1.67 tonight  Acute kidney injury on Chronic kidney disease stage III - repeat UA, recent UTI,. Likely pre-renal  Recent UTI - Started on Keflex for UTI on 11/18, his urine culture grew pansensitive Escherichia coli and Klebsiella, changed to Augmentin on 11/26 - Continue Augmentin for now - Repeat UA and culture  CAD, mitral insufficiency, chronic systolic CHF - No chest pain - Echo from 02/2014 with EF of 40% and moderate mitral regurg - Continue metoprolol, amiodarone, digoxin - Transition to heparin drip from warfarin in anticipation of surgery  HTN - Blood pressure reasonable tonight - Continue metoprolol - Hold lisinopril considering bumping creatinine  A Fib/flutter - Rate controlled and appears to be in normal sinus rhythm currently - Continue metoprolol, amiodarone, digoxin  FEN/GI: 75 mL/hr 7 hours, heart healthy diet if no surgery, nothing by mouth for now Prophylaxis: Full dose heparin for A. fib  Disposition: Telemetry  History of Present Illness: [name] is a 80 y.o. male  presenting with right femur fracture after an extrinsic fall earlier today.   History was conducted via an interpreter, the patient's son named [name], who states that he is a medical interpreter for the hospital.  His son explains that earlier today his dad was walking and stepped off of a curb when he fell landing on his right hip right wrist and hitting his head without loss of consciousness. He denies any dizziness preceding the event. His father was not in any pain directly after that event but could not stand. His son picked him up plased on the car and drove him to the ER.  His father has been recently treated for UTI, he was changed from Keflex to Augmentin one day ago as his symptoms are not resolved. He states that his father has not been eating and drinking normally today since the accident. He denies chest pain, dyspnea, palpitations, and abdominal pain.  Review Of Systems: Per HPI, Otherwise 12 point review of systems was performed and was unremarkable.  Patient Active Problem List   Diagnosis Date Noted  . Paroxysmal atrial tachycardia   . Paroxysmal atrial flutter   . CKD (chronic kidney disease), stage III   . Encounter for therapeutic drug monitoring 11/03/2013  . Eyelid cyst 07/09/2012  . Insomnia 11/18/2011  . BPH with urinary obstruction 05/14/2011  . HYPERTENSION, BENIGN 08/04/2009  . MITRAL INSUFFICIENCY 04/04/2009  . SYSTOLIC HEART FAILURE, CHRONIC 04/04/2009  . Paroxysmal atrial fibrillation 02/22/2009  . Unsteady gait 10/13/2008  . INSOMNIA, PERSISTENT 02/11/2008  . HYPOTHYROIDISM 07/21/2007  . HYPERLIPIDEMIA 07/21/2007  . ANXIETY 07/21/2007  . Coronary atherosclerosis 07/21/2007  . PERIPHERAL VASCULAR DISEASE 07/21/2007  . SKIN CANCER, HX OF 07/21/2007  . COLONIC POLYPS,  HX OF 07/21/2007   Past Medical History: Past Medical History  Diagnosis Date  . CAD (coronary artery disease)     a. LHC 4/06 with 60% pLAD, 40% ramus, 60% mRCA, and 90% mPDA. No  intervention. Inferior akinesis on LV-gram was suggestive of possible inferior MI with recanalization.  . Hyperlipidemia     a. Myalgias with Lipitor and Crestor.  . Hypothyroidism   . Peripheral vascular disease   . Anxiety   . Hx of colonic polyps   . Paroxysmal atrial fibrillation 02/2009     a. With RVR tx with amiodarone returning to NSR 02/2009. b. Had atypical aflutter vs atrial tach 4/14, converted to NSR 4/14. In atypical flutter vs atrial tach in 1/15, DCCV 1/15 to NSR. c. Atrial tach 7/15 with junctional escape 60s.  . Systolic CHF     a.  Probable ischemic cardiomyopathy. Echo (6/10) with EF 40%, mildly dilated LV, moderate MR. ;  b.  echo 3/12: mild LVH, EF 30-35%, IL HK and Lat HK, mod to severe MR, trivial AI, mod LAE  . PNA (pneumonia)     History of   . Mitral regurgitation     a. Moderate by echo 6/10. b. 2-3+ by cath 2006; mod to severe by echo 3/12  . PVC's (premature ventricular contractions)   . Cataract   . Heart murmur   . CKD (chronic kidney disease), stage III   . Skin cancer     Hx of 2008 BCC  . Chronic indwelling Foley catheter     per family, bladder muscles no longer work  . Depression   . Myocardial infarction   . Basal cell carcinoma   . Paroxysmal atrial tachycardia     a. See details under PAF.  Marland Kitchen Paroxysmal atrial flutter     a. See details under PAF.  Marland Kitchen BPH (benign prostatic hyperplasia)   . Recurrent UTI   . Imbalance   . Carotid stenosis     a. Dopplers 2/15 - 40-59% BICA.   Past Surgical History: Past Surgical History  Procedure Laterality Date  . Skin cancer excision  2008    shoulder and nose   . Cardioversion N/A 01/02/2013    Procedure: CARDIOVERSION;  Surgeon: Larey Dresser, MD;  Location: [place];  Service: Cardiovascular;  Laterality: N/A;  . Cardioversion N/A 10/23/2013    Procedure: CARDIOVERSION;  Surgeon: Larey Dresser, MD;  Location: [facility] ENDOSCOPY;  Service: Cardiovascular;  Laterality: N/A;   Social  History: History  Substance Use Topics  . Smoking status: Never Smoker   . Smokeless tobacco: Not on file  . Alcohol Use: No   Additional social history:Please also refer to relevant sections of EMR.  Family History: Family History  Problem Relation Age of Onset  . Coronary artery disease Other     male, 1st degree relative <60   . Heart disease Mother   . Heart attack Mother    Allergies and Medications: Allergies  Allergen Reactions  . Ciprofloxacin Nausea And Vomiting and Other (See Comments)    Possible tendinopathy   . Ezetimibe-Simvastatin Other (See Comments)     spasms  . Rosuvastatin Other (See Comments)    Leg spasms  . Atenolol Rash   No current facility-administered medications on file prior to encounter.   Current Outpatient Prescriptions on File Prior to Encounter  Medication Sig Dispense Refill  . amiodarone (PACERONE) 200 MG tablet Take 1 tablet (200 mg total) by mouth 2 (two) times daily.  180 tablet 3  . amoxicillin-clavulanate (AUGMENTIN) 875-125 MG per tablet Take 1 tablet by mouth 2 (two) times daily. (Patient taking differently: Take 1 tablet by mouth 2 (two) times daily. 10 day course started 08/19/14 pm) 20 tablet 0  . digoxin (LANOXIN) 0.125 MG tablet Take 0.5 tablets (0.0625 mg total) by mouth every other day. (Patient taking differently: Take 0.0625 mg by mouth every other day. Takes at noon) 15 tablet 3  . furosemide (LASIX) 40 MG tablet Take 1 tab in AM and 1/2 tab in PM (Patient taking differently: Take 20-40 mg by mouth 2 (two) times daily. Take 1 tablet (40 mg) every morning and 1/2 tablet (20 mg) every night) 45 tablet 3  . levothyroxine (SYNTHROID, LEVOTHROID) 100 MCG tablet Take 1 tablet (100 mcg total) by mouth daily before breakfast. 30 tablet 1  . lisinopril (PRINIVIL,ZESTRIL) 2.5 MG tablet Take 1 tablet (2.5 mg total) by mouth daily. (Patient taking differently: Take 2.5 mg by mouth daily at 3 pm. ) 90 tablet 0  . metoprolol succinate  (TOPROL XL) 25 MG 24 hr tablet Take 1 tablet (25 mg total) by mouth daily. 30 tablet 6  . Omega-3 Fatty Acids (FISH OIL) 1200 MG CAPS Take 1 capsule (1,200 mg total) by mouth daily. 90 capsule 3  . potassium chloride (K-DUR,KLOR-CON) 10 MEQ tablet Take 20 mEq by mouth daily.    . tamsulosin (FLOMAX) 0.4 MG CAPS capsule Take 0.4 mg by mouth at bedtime.     Marland Kitchen warfarin (COUMADIN) 1 MG tablet Take 1-2 tablets (1-2 mg total) by mouth daily. As directed (Patient taking differently: Take 1 mg by mouth every evening. 5pm) 35 tablet 3  . zolpidem (AMBIEN) 10 MG tablet TAKE 1 TABLET BY MOUTH AT BEDTIME AS NEEDED FOR SLEEP (Patient not taking: Reported on 08/20/2014) 30 tablet 3    Objective: BP 126/54 mmHg  Pulse 53  Temp(Src) 98.5 F (36.9 C) (Oral)  Resp 17  SpO2 97% Exam: Gen: NAD, alert, cooperative with exam HEENT: NCAT, EOMI, MMM CV: RRR, good U5/K2, 2 to 3/6 systolic murmur Resp: CTABL, no wheezes, non-labored Abd: SNTND, BS present, no guarding or organomegaly Ext: No edema, warm with 1+ DP pulse bilaterally, right lower extremity shortened and externally rotated Neuro: Alert and interactive with his children, did not speak to me directly as he has a language barrier, strength 5/5 in bilateral upper and lower extremities   Labs and Imaging: CBC BMET   Recent Labs Lab 08/20/14 1934  WBC 9.7  HGB 12.1*  HCT 37.1*  PLT 200    Recent Labs Lab 08/20/14 1934  NA 137  K 4.5  CL 99  CO2 24  BUN 35*  CREATININE 2.03*  GLUCOSE 116*  CALCIUM 8.4      Recent Labs Lab 08/16/14 1217 08/20/14 1934  INR 1.5 1.67*     CT head 08/20/2014 IMPRESSION: Atrophy and chronic ischemia. No acute abnormality.  R femur fracture  XR 08/20/2014 IMPRESSION: Subcapital right femoral fracture.  R pelvis XR 08/20/2014 IMPRESSION: Right femoral neck fracture.  R wrist XR 08/20/2014 IMPRESSION: Findings consistent with osteoarthritis as described above. No acute abnormality seen  in the right wrist.   Timmothy Euler, MD 08/20/2014, 10:41 PM PGY-3, [facility] Intern pager: [phone], text pages welcome

## 2014-08-20 NOTE — ED Notes (Signed)
Patient transported to X-ray and CT scan.  

## 2014-08-20 NOTE — ED Notes (Signed)
Dr. Verneda Skill notified on pt.'s family concern about his heart rate increasing to 70's / bedtime medications .

## 2014-08-20 NOTE — ED Provider Notes (Signed)
I saw and evaluated the patient, reviewed the resident's note and I agree with the findings and plan.   EKG Interpretation None      Pt comes in with cc of mechanical fall. Pain to the right hip, difficulty intubating Also [place] injury to the right wrist. Imaging shows hip fracture. Ortho to see, Medicine to admit.    ICD-9-CM ICD-10-CM   1. Closed right hip fracture, initial encounter 820.8 S72.001A   2. Fall E888.9 W19.XXXA DG Wrist Complete Right     DG Pelvis 1-2 Views     DG Wrist Complete Right     DG Pelvis 1-2 Views     CANCELED: DG Knee 2 Views Right     CANCELED: DG Knee 2 Views Right  3. History of right hip hemiarthroplasty V43.64 Z96.641 Pelvis Portable     Pelvis Portable     Varney Biles, MD 08/22/14 [phone]

## 2014-08-21 ENCOUNTER — Encounter (HOSPITAL_COMMUNITY): Payer: Self-pay | Admitting: Anesthesiology

## 2014-08-21 ENCOUNTER — Inpatient Hospital Stay (HOSPITAL_COMMUNITY): Payer: Medicare Other | Admitting: Certified Registered"

## 2014-08-21 ENCOUNTER — Encounter (HOSPITAL_COMMUNITY)
Admission: EM | Disposition: A | Discharge status: Skilled Nursing Facility | Payer: Self-pay | Source: Home / Self Care | Attending: Family Medicine

## 2014-08-21 ENCOUNTER — Inpatient Hospital Stay (HOSPITAL_COMMUNITY): Payer: Medicare Other

## 2014-08-21 ENCOUNTER — Encounter: Payer: Self-pay | Admitting: Cardiology

## 2014-08-21 ENCOUNTER — Other Ambulatory Visit: Payer: Self-pay

## 2014-08-21 DIAGNOSIS — Z01810 Encounter for preprocedural cardiovascular examination: Secondary | ICD-10-CM

## 2014-08-21 DIAGNOSIS — I5022 Chronic systolic (congestive) heart failure: Secondary | ICD-10-CM

## 2014-08-21 DIAGNOSIS — W19XXXD Unspecified fall, subsequent encounter: Secondary | ICD-10-CM

## 2014-08-21 DIAGNOSIS — S72001A Fracture of unspecified part of neck of right femur, initial encounter for closed fracture: Secondary | ICD-10-CM | POA: Insufficient documentation

## 2014-08-21 DIAGNOSIS — W19XXXA Unspecified fall, initial encounter: Secondary | ICD-10-CM | POA: Insufficient documentation

## 2014-08-21 HISTORY — PX: HIP ARTHROPLASTY: SHX981

## 2014-08-21 LAB — COMPREHENSIVE METABOLIC PANEL
ALK PHOS: 57 U/L (ref 39–117)
ALT: 37 U/L (ref 0–53)
AST: 50 U/L — ABNORMAL HIGH (ref 0–37)
Albumin: 3 g/dL — ABNORMAL LOW (ref 3.5–5.2)
Anion gap: 14 (ref 5–15)
BUN: 31 mg/dL — ABNORMAL HIGH (ref 6–23)
CALCIUM: 8.3 mg/dL — AB (ref 8.4–10.5)
CO2: 22 meq/L (ref 19–32)
Chloride: 101 mEq/L (ref 96–112)
Creatinine, Ser: 1.65 mg/dL — ABNORMAL HIGH (ref 0.50–1.35)
GFR, EST AFRICAN AMERICAN: 42 mL/min — AB (ref >90)
GFR, EST NON AFRICAN AMERICAN: 36 mL/min — AB (ref >90)
GLUCOSE: 102 mg/dL — AB (ref 70–99)
Potassium: 4.1 mEq/L (ref 3.7–5.3)
Sodium: 137 mEq/L (ref 137–147)
Total Bilirubin: 0.7 mg/dL (ref 0.3–1.2)
Total Protein: 6.9 g/dL (ref 6.0–8.3)

## 2014-08-21 LAB — URINALYSIS, ROUTINE W REFLEX MICROSCOPIC
Bilirubin Urine: NEGATIVE
Glucose, UA: NEGATIVE mg/dL
KETONES UR: NEGATIVE mg/dL
LEUKOCYTES UA: NEGATIVE
NITRITE: NEGATIVE
PH: 5 (ref 5.0–8.0)
Protein, ur: NEGATIVE mg/dL
Specific Gravity, Urine: 1.011 (ref 1.005–1.030)
Urobilinogen, UA: 0.2 mg/dL (ref 0.0–1.0)

## 2014-08-21 LAB — CBC
HCT: 35.2 % — ABNORMAL LOW (ref 39.0–52.0)
Hemoglobin: 11.6 g/dL — ABNORMAL LOW (ref 13.0–17.0)
MCH: 29.7 pg (ref 26.0–34.0)
MCHC: 33 g/dL (ref 30.0–36.0)
MCV: 90.3 fL (ref 78.0–100.0)
PLATELETS: 179 10*3/uL (ref 150–400)
RBC: 3.9 MIL/uL — ABNORMAL LOW (ref 4.22–5.81)
RDW: 15.1 % (ref 11.5–15.5)
WBC: 12.7 10*3/uL — ABNORMAL HIGH (ref 4.0–10.5)

## 2014-08-21 LAB — MRSA PCR SCREENING: MRSA by PCR: NEGATIVE

## 2014-08-21 LAB — URINE MICROSCOPIC-ADD ON

## 2014-08-21 LAB — HEPARIN LEVEL (UNFRACTIONATED): HEPARIN UNFRACTIONATED: 0.17 [IU]/mL — AB (ref 0.30–0.70)

## 2014-08-21 SURGERY — HEMIARTHROPLASTY, HIP, DIRECT ANTERIOR APPROACH, FOR FRACTURE
Anesthesia: General | Laterality: Right

## 2014-08-21 MED ORDER — ACETAMINOPHEN 650 MG RE SUPP
650.0000 mg | Freq: Four times a day (QID) | RECTAL | Status: DC | PRN
Start: 2014-08-21 — End: 2014-08-21

## 2014-08-21 MED ORDER — CEFAZOLIN SODIUM-DEXTROSE 2-3 GM-% IV SOLR
2.0000 g | Freq: Once | INTRAVENOUS | Status: DC
  Filled 2014-08-21: qty 50

## 2014-08-21 MED ORDER — NEOSTIGMINE METHYLSULFATE 10 MG/10ML IV SOLN
INTRAVENOUS | Status: AC
  Filled 2014-08-21: qty 1

## 2014-08-21 MED ORDER — WARFARIN SODIUM 1 MG PO TABS
1.5000 mg | ORAL_TABLET | Freq: Once | ORAL | Status: AC
  Administered 2014-08-21: 1.5 mg via ORAL
  Filled 2014-08-21: qty 1

## 2014-08-21 MED ORDER — FENTANYL CITRATE 0.05 MG/ML IJ SOLN
INTRAMUSCULAR | Status: AC
  Filled 2014-08-21: qty 5

## 2014-08-21 MED ORDER — DOCUSATE SODIUM 100 MG PO CAPS
100.0000 mg | ORAL_CAPSULE | Freq: Two times a day (BID) | ORAL | Status: DC
  Administered 2014-08-21 – 2014-08-24 (×6): 100 mg via ORAL
  Filled 2014-08-21 (×7): qty 1

## 2014-08-21 MED ORDER — HYDROCODONE-ACETAMINOPHEN 5-325 MG PO TABS: 1.0000 | ORAL_TABLET | ORAL | Status: DC | PRN

## 2014-08-21 MED ORDER — ONDANSETRON HCL 4 MG/2ML IJ SOLN
INTRAMUSCULAR | Status: AC
  Filled 2014-08-21: qty 2

## 2014-08-21 MED ORDER — ARTIFICIAL TEARS OP OINT
TOPICAL_OINTMENT | OPHTHALMIC | Status: AC
  Filled 2014-08-21: qty 3.5

## 2014-08-21 MED ORDER — PROPOFOL 10 MG/ML IV BOLUS
INTRAVENOUS | Status: DC | PRN
  Administered 2014-08-21: 70 mg via INTRAVENOUS

## 2014-08-21 MED ORDER — ACETAMINOPHEN 325 MG PO TABS: 650.0000 mg | ORAL_TABLET | Freq: Four times a day (QID) | ORAL | Status: DC | PRN

## 2014-08-21 MED ORDER — LIDOCAINE HCL (CARDIAC) 20 MG/ML IV SOLN
INTRAVENOUS | Status: DC | PRN
  Administered 2014-08-21: 50 mg via INTRAVENOUS

## 2014-08-21 MED ORDER — METOPROLOL SUCCINATE 12.5 MG HALF TABLET
12.5000 mg | ORAL_TABLET | Freq: Two times a day (BID) | ORAL | Status: DC
  Filled 2014-08-21 (×2): qty 1

## 2014-08-21 MED ORDER — HEPARIN (PORCINE) IN NACL 100-0.45 UNIT/ML-% IJ SOLN
1200.0000 [IU]/h | INTRAMUSCULAR | Status: DC
  Administered 2014-08-21: 1050 [IU]/h via INTRAVENOUS
  Administered 2014-08-22: 1200 [IU]/h via INTRAVENOUS
  Filled 2014-08-21 (×3): qty 250

## 2014-08-21 MED ORDER — FENTANYL CITRATE 0.05 MG/ML IJ SOLN
INTRAMUSCULAR | Status: AC
  Filled 2014-08-21: qty 2

## 2014-08-21 MED ORDER — SODIUM CHLORIDE 0.9 % IV SOLN
INTRAVENOUS | Status: DC
  Administered 2014-08-21: 16:00:00 via INTRAVENOUS

## 2014-08-21 MED ORDER — ONDANSETRON HCL 4 MG PO TABS: 4.0000 mg | ORAL_TABLET | Freq: Four times a day (QID) | ORAL | Status: DC | PRN

## 2014-08-21 MED ORDER — CEFAZOLIN SODIUM-DEXTROSE 2-3 GM-% IV SOLR
INTRAVENOUS | Status: AC
  Filled 2014-08-21: qty 50

## 2014-08-21 MED ORDER — MENTHOL 3 MG MT LOZG: 1.0000 | LOZENGE | OROMUCOSAL | Status: DC | PRN

## 2014-08-21 MED ORDER — HYDROCODONE-ACETAMINOPHEN 5-325 MG PO TABS
1.0000 | ORAL_TABLET | Freq: Four times a day (QID) | ORAL | Status: DC | PRN
  Administered 2014-08-23: 1 via ORAL
  Filled 2014-08-21: qty 1

## 2014-08-21 MED ORDER — WARFARIN - PHARMACIST DOSING INPATIENT
Freq: Every day | Status: DC
  Administered 2014-08-21 – 2014-08-22 (×2)

## 2014-08-21 MED ORDER — FENTANYL CITRATE 0.05 MG/ML IJ SOLN
INTRAMUSCULAR | Status: DC | PRN
  Administered 2014-08-21: 25 ug via INTRAVENOUS
  Administered 2014-08-21: 50 ug via INTRAVENOUS

## 2014-08-21 MED ORDER — MORPHINE SULFATE 2 MG/ML IJ SOLN: 0.5000 mg | INTRAMUSCULAR | Status: DC | PRN

## 2014-08-21 MED ORDER — TAMSULOSIN HCL 0.4 MG PO CAPS
0.4000 mg | ORAL_CAPSULE | Freq: Every day | ORAL | Status: DC
  Administered 2014-08-21 – 2014-08-23 (×4): 0.4 mg via ORAL
  Filled 2014-08-21 (×5): qty 1

## 2014-08-21 MED ORDER — PROPOFOL 10 MG/ML IV BOLUS
INTRAVENOUS | Status: AC
  Filled 2014-08-21: qty 20

## 2014-08-21 MED ORDER — POTASSIUM CHLORIDE CRYS ER 20 MEQ PO TBCR
20.0000 meq | EXTENDED_RELEASE_TABLET | Freq: Every day | ORAL | Status: DC
  Administered 2014-08-22 – 2014-08-24 (×3): 20 meq via ORAL
  Filled 2014-08-21 (×4): qty 1

## 2014-08-21 MED ORDER — FENTANYL CITRATE 0.05 MG/ML IJ SOLN
25.0000 ug | INTRAMUSCULAR | Status: DC | PRN
  Administered 2014-08-21: 50 ug via INTRAVENOUS

## 2014-08-21 MED ORDER — DIGOXIN 0.0625 MG HALF TABLET
0.0625 mg | ORAL_TABLET | ORAL | Status: DC
  Administered 2014-08-22 – 2014-08-24 (×2): 0.0625 mg via ORAL
  Filled 2014-08-21 (×2): qty 1

## 2014-08-21 MED ORDER — SODIUM CHLORIDE 0.9 % IV SOLN
INTRAVENOUS | Status: DC | PRN
Start: 2014-08-21 — End: 2014-08-21
  Administered 2014-08-21 (×2): via INTRAVENOUS

## 2014-08-21 MED ORDER — ACETAMINOPHEN 650 MG RE SUPP: 650.0000 mg | Freq: Four times a day (QID) | RECTAL | Status: DC | PRN

## 2014-08-21 MED ORDER — METOPROLOL SUCCINATE ER 25 MG PO TB24
25.0000 mg | ORAL_TABLET | Freq: Every day | ORAL | Status: DC
  Administered 2014-08-21: 25 mg via ORAL
  Filled 2014-08-21 (×2): qty 1

## 2014-08-21 MED ORDER — GLYCOPYRROLATE 0.2 MG/ML IJ SOLN
INTRAMUSCULAR | Status: AC
  Filled 2014-08-21: qty 2

## 2014-08-21 MED ORDER — ROCURONIUM BROMIDE 50 MG/5ML IV SOLN
INTRAVENOUS | Status: AC
  Filled 2014-08-21: qty 1

## 2014-08-21 MED ORDER — CEFAZOLIN SODIUM-DEXTROSE 2-3 GM-% IV SOLR
2.0000 g | Freq: Four times a day (QID) | INTRAVENOUS | Status: AC
  Administered 2014-08-21 (×2): 2 g via INTRAVENOUS
  Filled 2014-08-21 (×2): qty 50

## 2014-08-21 MED ORDER — PROMETHAZINE HCL 25 MG/ML IJ SOLN: 6.2500 mg | INTRAMUSCULAR | Status: DC | PRN

## 2014-08-21 MED ORDER — PHENYLEPHRINE HCL 10 MG/ML IJ SOLN
INTRAMUSCULAR | Status: DC | PRN
  Administered 2014-08-21 (×2): 80 ug via INTRAVENOUS

## 2014-08-21 MED ORDER — CEFAZOLIN SODIUM-DEXTROSE 2-3 GM-% IV SOLR
INTRAVENOUS | Status: DC | PRN
  Administered 2014-08-21: 2 g via INTRAVENOUS

## 2014-08-21 MED ORDER — METOPROLOL SUCCINATE 12.5 MG HALF TABLET
12.5000 mg | ORAL_TABLET | Freq: Two times a day (BID) | ORAL | Status: DC
  Administered 2014-08-21 – 2014-08-24 (×6): 12.5 mg via ORAL
  Filled 2014-08-21 (×7): qty 1

## 2014-08-21 MED ORDER — LEVOTHYROXINE SODIUM 100 MCG PO TABS
100.0000 ug | ORAL_TABLET | Freq: Every day | ORAL | Status: DC
  Administered 2014-08-22 – 2014-08-24 (×3): 100 ug via ORAL
  Filled 2014-08-21 (×4): qty 1

## 2014-08-21 MED ORDER — ONDANSETRON HCL 4 MG/2ML IJ SOLN: 4.0000 mg | Freq: Four times a day (QID) | INTRAMUSCULAR | Status: DC | PRN

## 2014-08-21 MED ORDER — METOPROLOL SUCCINATE 12.5 MG HALF TABLET
12.5000 mg | ORAL_TABLET | Freq: Two times a day (BID) | ORAL | Status: DC
  Filled 2014-08-21 (×4): qty 1

## 2014-08-21 MED ORDER — DIGOXIN 0.0625 MG HALF TABLET
0.0625 mg | ORAL_TABLET | ORAL | Status: DC
  Filled 2014-08-21: qty 1

## 2014-08-21 MED ORDER — METOCLOPRAMIDE HCL 5 MG/ML IJ SOLN: 5.0000 mg | Freq: Three times a day (TID) | INTRAMUSCULAR | Status: DC | PRN

## 2014-08-21 MED ORDER — FUROSEMIDE 40 MG PO TABS
40.0000 mg | ORAL_TABLET | Freq: Two times a day (BID) | ORAL | Status: DC
  Administered 2014-08-21 – 2014-08-23 (×4): 40 mg via ORAL
  Filled 2014-08-21 (×7): qty 1

## 2014-08-21 MED ORDER — ACETAMINOPHEN 325 MG PO TABS
650.0000 mg | ORAL_TABLET | Freq: Four times a day (QID) | ORAL | Status: DC | PRN
  Administered 2014-08-22 (×2): 650 mg via ORAL
  Filled 2014-08-21 (×2): qty 2

## 2014-08-21 MED ORDER — PHENYLEPHRINE HCL 10 MG/ML IJ SOLN
10.0000 mg | INTRAVENOUS | Status: DC | PRN
  Administered 2014-08-21: 15 ug/min via INTRAVENOUS

## 2014-08-21 MED ORDER — PHENOL 1.4 % MT LIQD
1.0000 | OROMUCOSAL | Status: DC | PRN
  Filled 2014-08-21: qty 177

## 2014-08-21 MED ORDER — AMIODARONE HCL 200 MG PO TABS
200.0000 mg | ORAL_TABLET | Freq: Two times a day (BID) | ORAL | Status: DC
  Administered 2014-08-21 – 2014-08-24 (×9): 200 mg via ORAL
  Filled 2014-08-21 (×10): qty 1

## 2014-08-21 MED ORDER — ROCURONIUM BROMIDE 100 MG/10ML IV SOLN
INTRAVENOUS | Status: DC | PRN
  Administered 2014-08-21: 25 mg via INTRAVENOUS

## 2014-08-21 MED ORDER — ONDANSETRON HCL 4 MG/2ML IJ SOLN
INTRAMUSCULAR | Status: DC | PRN
  Administered 2014-08-21: 4 mg via INTRAVENOUS

## 2014-08-21 MED ORDER — HEPARIN (PORCINE) IN NACL 100-0.45 UNIT/ML-% IJ SOLN
1050.0000 [IU]/h | INTRAMUSCULAR | Status: DC
  Administered 2014-08-21: 850 [IU]/h via INTRAVENOUS
  Filled 2014-08-21 (×2): qty 250

## 2014-08-21 MED ORDER — METOCLOPRAMIDE HCL 10 MG PO TABS: 5.0000 mg | ORAL_TABLET | Freq: Three times a day (TID) | ORAL | Status: DC | PRN

## 2014-08-21 MED ORDER — METHOCARBAMOL 1000 MG/10ML IJ SOLN
500.0000 mg | Freq: Four times a day (QID) | INTRAVENOUS | Status: DC | PRN
  Filled 2014-08-21: qty 5

## 2014-08-21 MED ORDER — SODIUM CHLORIDE 0.9 % IR SOLN
Status: DC | PRN
  Administered 2014-08-21: 1

## 2014-08-21 MED ORDER — SODIUM CHLORIDE 0.9 % IV SOLN
INTRAVENOUS | Status: AC
  Administered 2014-08-21: 01:00:00 via INTRAVENOUS

## 2014-08-21 MED ORDER — ZOLPIDEM TARTRATE 5 MG PO TABS
5.0000 mg | ORAL_TABLET | Freq: Every day | ORAL | Status: DC
  Administered 2014-08-21 – 2014-08-23 (×4): 5 mg via ORAL
  Filled 2014-08-21 (×4): qty 1

## 2014-08-21 MED ORDER — EPHEDRINE SULFATE 50 MG/ML IJ SOLN
INTRAMUSCULAR | Status: DC | PRN
  Administered 2014-08-21 (×2): 10 mg via INTRAVENOUS

## 2014-08-21 MED ORDER — GLYCOPYRROLATE 0.2 MG/ML IJ SOLN
INTRAMUSCULAR | Status: DC | PRN
  Administered 2014-08-21: 0.4 mg via INTRAVENOUS

## 2014-08-21 MED ORDER — LACTATED RINGERS IV SOLN: INTRAVENOUS | Status: DC | PRN

## 2014-08-21 MED ORDER — NEOSTIGMINE METHYLSULFATE 10 MG/10ML IV SOLN
INTRAVENOUS | Status: DC | PRN
  Administered 2014-08-21: 3 mg via INTRAVENOUS

## 2014-08-21 MED ORDER — METHOCARBAMOL 500 MG PO TABS
500.0000 mg | ORAL_TABLET | Freq: Four times a day (QID) | ORAL | Status: DC | PRN
  Filled 2014-08-21: qty 1

## 2014-08-21 MED ORDER — AMOXICILLIN-POT CLAVULANATE 500-125 MG PO TABS
1.0000 | ORAL_TABLET | Freq: Two times a day (BID) | ORAL | Status: DC
  Administered 2014-08-21 – 2014-08-22 (×4): 500 mg via ORAL
  Filled 2014-08-21 (×7): qty 1

## 2014-08-21 SURGICAL SUPPLY — 41 items
BLADE SAGITTAL (BLADE) ×2
BLADE SAW THK.89X75X18XSGTL (BLADE) ×1 IMPLANT
CAPT HIP FX BIPOLAR/UNIPOLAR ×3 IMPLANT
COVER SURGICAL LIGHT HANDLE (MISCELLANEOUS) ×3 IMPLANT
DRAPE HIP W/POCKET STRL (DRAPE) ×3 IMPLANT
DRAPE IMP U-DRAPE 54X76 (DRAPES) ×3 IMPLANT
DRAPE INCISE IOBAN 85X60 (DRAPES) ×6 IMPLANT
DRAPE U-SHAPE 47X51 STRL (DRAPES) ×3 IMPLANT
DRSG AQUACEL AG ADV 3.5X10 (GAUZE/BANDAGES/DRESSINGS) ×3 IMPLANT
DRSG MEPILEX BORDER 4X8 (GAUZE/BANDAGES/DRESSINGS) ×3 IMPLANT
DURAPREP 26ML APPLICATOR (WOUND CARE) ×3 IMPLANT
ELECT BLADE 6.5 EXT (BLADE) IMPLANT
ELECT CAUTERY BLADE 6.4 (BLADE) ×3 IMPLANT
ELECT REM PT RETURN 9FT ADLT (ELECTROSURGICAL) ×3
ELECTRODE REM PT RTRN 9FT ADLT (ELECTROSURGICAL) ×1 IMPLANT
EVACUATOR 1/8 PVC DRAIN (DRAIN) IMPLANT
GLOVE BIOGEL PI IND STRL 7.5 (GLOVE) ×1 IMPLANT
GLOVE BIOGEL PI IND STRL 8 (GLOVE) ×1 IMPLANT
GLOVE BIOGEL PI INDICATOR 7.5 (GLOVE) ×2
GLOVE BIOGEL PI INDICATOR 8 (GLOVE) ×2
GLOVE ORTHO TXT STRL SZ7.5 (GLOVE) ×3 IMPLANT
GOWN STRL REUS W/ TWL LRG LVL3 (GOWN DISPOSABLE) ×2 IMPLANT
GOWN STRL REUS W/ TWL XL LVL3 (GOWN DISPOSABLE) ×4 IMPLANT
GOWN STRL REUS W/TWL LRG LVL3 (GOWN DISPOSABLE) ×4
GOWN STRL REUS W/TWL XL LVL3 (GOWN DISPOSABLE) ×8
KIT BASIN OR (CUSTOM PROCEDURE TRAY) ×3 IMPLANT
KIT ROOM TURNOVER OR (KITS) ×3 IMPLANT
MANIFOLD NEPTUNE II (INSTRUMENTS) ×3 IMPLANT
NS IRRIG 1000ML POUR BTL (IV SOLUTION) ×3 IMPLANT
PACK TOTAL JOINT (CUSTOM PROCEDURE TRAY) ×3 IMPLANT
PACK UNIVERSAL I (CUSTOM PROCEDURE TRAY) ×3 IMPLANT
PAD ARMBOARD 7.5X6 YLW CONV (MISCELLANEOUS) ×6 IMPLANT
STAPLER VISISTAT 35W (STAPLE) ×3 IMPLANT
SUT ETHIBOND NAB CT1 #1 30IN (SUTURE) ×6 IMPLANT
SUT VIC AB 1 CTB1 27 (SUTURE) ×6 IMPLANT
SUT VIC AB 2-0 CT1 27 (SUTURE) ×4
SUT VIC AB 2-0 CT1 TAPERPNT 27 (SUTURE) ×2 IMPLANT
TOWEL OR 17X24 6PK STRL BLUE (TOWEL DISPOSABLE) ×3 IMPLANT
TOWEL OR 17X26 10 PK STRL BLUE (TOWEL DISPOSABLE) ×3 IMPLANT
TRAY FOLEY CATH 16FRSI W/METER (SET/KITS/TRAYS/PACK) IMPLANT
WATER STERILE IRR 1000ML POUR (IV SOLUTION) IMPLANT

## 2014-08-21 NOTE — Progress Notes (Signed)
ANTICOAGULATION CONSULT NOTE - Initial Consult  Pharmacy Consult for Heparin (while warfarin on hold) Indication: atrial fibrillation  Allergies  Allergen Reactions  . Ciprofloxacin Nausea And Vomiting and Other (See Comments)    Possible tendinopathy   . Ezetimibe-Simvastatin Other (See Comments)     spasms  . Rosuvastatin Other (See Comments)    Leg spasms  . Atenolol Rash    Patient Measurements: 62 kg  Vital Signs: Temp: 98.5 F (36.9 C) (11/27 1917) Temp Source: Oral (11/27 1917) BP: 113/66 mmHg (11/27 2300) Pulse Rate: 78 (11/27 2300)  Labs:  Recent Labs  08/20/14 1934  HGB 12.1*  HCT 37.1*  PLT 200  LABPROT 19.9*  INR 1.67*  CREATININE 2.03*    Estimated Creatinine Clearance: 20.6 mL/min (by C-G formula based on Cr of 2.03).   Medical History: Past Medical History  Diagnosis Date  . CAD (coronary artery disease)     a. LHC 4/06 with 60% pLAD, 40% ramus, 60% mRCA, and 90% mPDA. No intervention. Inferior akinesis on LV-gram was suggestive of possible inferior MI with recanalization.  . Hyperlipidemia     a. Myalgias with Lipitor and Crestor.  . Hypothyroidism   . Peripheral vascular disease   . Anxiety   . Hx of colonic polyps   . Paroxysmal atrial fibrillation 02/2009     a. With RVR tx with amiodarone returning to NSR 02/2009. b. Had atypical aflutter vs atrial tach 4/14, converted to NSR 4/14. In atypical flutter vs atrial tach in 1/15, DCCV 1/15 to NSR. c. Atrial tach 7/15 with junctional escape 60s.  . Systolic CHF     a.  Probable ischemic cardiomyopathy. Echo (6/10) with EF 40%, mildly dilated LV, moderate MR. ;  b.  echo 3/12: mild LVH, EF 30-35%, IL HK and Lat HK, mod to severe MR, trivial AI, mod LAE  . PNA (pneumonia)     History of   . Mitral regurgitation     a. Moderate by echo 6/10. b. 2-3+ by cath 2006; mod to severe by echo 3/12  . PVC's (premature ventricular contractions)   . Cataract   . Heart murmur   . CKD (chronic kidney  disease), stage III   . Skin cancer     Hx of 2008 BCC  . Chronic indwelling Foley catheter     per family, bladder muscles no longer work  . Depression   . Myocardial infarction   . Basal cell carcinoma   . Paroxysmal atrial tachycardia     a. See details under PAF.  Marland Kitchen Paroxysmal atrial flutter     a. See details under PAF.  Marland Kitchen BPH (benign prostatic hyperplasia)   . Recurrent UTI   . Imbalance   . Carotid stenosis     a. Dopplers 2/15 - 40-59% BICA.     Assessment: Transitioning from warfarin to heparin in anticipation of ortho surgery. INR is 1.67. Hgb 12.1. Scr 2.03. Other labs as as above.   Goal of Therapy:  Heparin level 0.3-0.7 units/ml Monitor platelets by anticoagulation protocol: Yes   Plan:  -Start heparin drip at 850 units/hr, no bolus with elevated INR -0930 HL -Daily CBC/HL -Monitor for bleeding -F/U surgery plans  Narda Bonds 08/21/2014,12:32 AM

## 2014-08-21 NOTE — Progress Notes (Signed)
Family Medicine Teaching Service Daily Progress Note Intern Pager: [phone]  Patient name: [name] Medical record number: [id number] Date of birth: 1925-11-21 Age: 80 y.o. Gender: male  Primary Care Provider: [name], MD Consultants: ortho, cards Code Status: full  Assessment and Plan: Dashiell Franchino is a 80 y.o. male presenting with femur fracture . PMH is significant for HTN, HLD, hypothyroidism, BPH, CAD with mitral insufficiency, chronic systolic CHF, peripheral vascular disease, paroxysmal atrial flutter/fib, and CKD stage III.   Right femoral fracture - related to fall after stepping off curb - Orthopedics consulted, surgery today - Physical therapy recommendations when he is ready - On warfarin for A. fib, transition to heparin drip in anticipation of surgery - last INR 1.67  Acute kidney injury on Chronic kidney disease stage III - repeat UA with no signs of infection.  - likey prerenal  Recent UTI - Started on Keflex for UTI on 11/18, his urine culture grew pansensitive Escherichia coli and Klebsiella, changed to Augmentin on 11/26 - Continue Augmentin for now - F/u urine culture  CAD, mitral insufficiency, chronic systolic CHF - No chest pain at this time, appears to be at cardiac baseline - Echo from 02/2014 with EF of 40% and moderate mitral regurg - Continue metoprolol, amiodarone, digoxin - Transition to heparin drip from warfarin given anticipated surgery - consult cards for surgical clearance - they have cleared him for surgery, appreciate their help  HTN - Blood pressure stable - Continue metoprolol - will make BID per patients home dosing - Hold lisinopril considering bumping creatinine  A Fib/flutter - Rate controlled and appears to be in normal sinus rhythm currently - Continue metoprolol, amiodarone, digoxin  FEN/GI: NPO pending surgery Prophylaxis: Full dose heparin for A. fib  Disposition: surgery today, discharge pending PT eval  following surgery  Subjective:  Notes pain only with movement. Otherwise reports he feels well.   Objective: Temp:  [98.1 F (36.7 C)-99.3 F (37.4 C)] 99.3 F (37.4 C) (11/28 1243) Pulse Rate:  [51-79] 54 (11/28 1243) Resp:  [16-31] 18 (11/28 1243) BP: (111-126)/(44-66) 115/44 mmHg (11/28 1243) SpO2:  [94 %-97 %] 94 % (11/28 1243) Weight:  [137 lb 4.8 oz (62.279 kg)] 137 lb 4.8 oz (62.279 kg) (11/28 0045) Physical Exam: General: NAD, resting comfortably in bed Cardiovascular: bradycardic, regular rhythm Respiratory: CTAB, no rales or wheezes Abdomen: s, NT, ND Extremities: no edema MSK: RLE shortened, externally rotated, warm, well prefused  Laboratory:  Recent Labs Lab 08/20/14 1934 08/21/14 0320  WBC 9.7 12.7*  HGB 12.1* 11.6*  HCT 37.1* 35.2*  PLT 200 179    Recent Labs Lab 08/20/14 1934 08/21/14 0320  NA 137 137  K 4.5 4.1  CL 99 101  CO2 24 22  BUN 35* 31*  CREATININE 2.03* 1.65*  CALCIUM 8.4 8.3*  PROT  --  6.9  BILITOT  --  0.7  ALKPHOS  --  57  ALT  --  37  AST  --  50*  GLUCOSE 116* 102*    Dg Pelvis 1-2 Views  08/20/2014   CLINICAL DATA:  Fall  EXAM: PELVIS - 1-2 VIEW  COMPARISON:  None.  FINDINGS: Right femoral neck fracture with mild displacement. Hip joint appears normal bilaterally. No other fracture identified.  IMPRESSION: Right femoral neck fracture.   Electronically Signed   By: Franchot Gallo M.D.   On: 08/20/2014 20:20   Dg Wrist Complete Right  08/20/2014   CLINICAL DATA:  Acute right wrist pain after fall.  EXAM: RIGHT WRIST - COMPLETE 3+ VIEW  COMPARISON:  None.  FINDINGS: There is no evidence of fracture or dislocation. Narrowing and sclerosis is seen involving the joint space between the scaphoid and trapezium, as well as the first carpometacarpal joint. Soft tissues are unremarkable.  IMPRESSION: Findings consistent with osteoarthritis as described above. No acute abnormality seen in the right wrist.   Electronically Signed   By:  Sabino Dick M.D.   On: 08/20/2014 20:23   Dg Femur Right  08/20/2014   CLINICAL DATA:  Fall with right hip pain.  EXAM: RIGHT FEMUR - 2 VIEW  COMPARISON:  None.  FINDINGS: There is a fracture of the proximal right femur. Findings are consistent with a subcapital fracture. Mild elevation of the distal fragment. The right femoral head is located. No acute bone abnormality in the mid and distal femur.  IMPRESSION: Subcapital right femoral fracture.   Electronically Signed   By: Markus Daft M.D.   On: 08/20/2014 20:22   Ct Head Wo Contrast  08/20/2014   CLINICAL DATA:  Fall  EXAM: CT HEAD WITHOUT CONTRAST  TECHNIQUE: Contiguous axial images were obtained from the base of the skull through the vertex without intravenous contrast.  COMPARISON:  None.  FINDINGS: Moderate atrophy. Chronic microvascular ischemic changes in the white matter. Chronic infarct right cerebellum.  Negative for acute infarct. Negative for hemorrhage or mass. No shift of the midline structures. Negative for fracture.  IMPRESSION: Atrophy and chronic ischemia.  No acute abnormality.   Electronically Signed   By: Franchot Gallo M.D.   On: 08/20/2014 20:37     Leone Haven, MD 08/21/2014, 1:24 PM PGY-3, [place] Intern pager: [phone], text pages welcome

## 2014-08-21 NOTE — Progress Notes (Signed)
Spoke to Dr. Marlou Sa with ortho; stated it would be okay to re-start heparin gtt between 10-11pm tonight.   Archie Patten, MD [facility] Family Medicine Resident  08/21/2014, 4:20 PM

## 2014-08-21 NOTE — Progress Notes (Signed)
ANTICOAGULATION CONSULT NOTE - Initial Consult  Pharmacy Consult for coumadin Indication: VTE prophylaxis  Allergies  Allergen Reactions  . Ciprofloxacin Nausea And Vomiting and Other (See Comments)    Possible tendinopathy   . Ezetimibe-Simvastatin Other (See Comments)     spasms  . Rosuvastatin Other (See Comments)    Leg spasms  . Atenolol Rash    Patient Measurements: Height: 5' 2.5" (158.8 cm) Weight: 137 lb 4.8 oz (62.279 kg) IBW/kg (Calculated) : 55.75   Vital Signs: Temp: 98.5 F (36.9 C) (11/28 1545) Temp Source: Oral (11/28 1545) BP: 110/56 mmHg (11/28 1545) Pulse Rate: 55 (11/28 1545)  Labs:  Recent Labs  08/20/14 1934 08/21/14 0320 08/21/14 0925  HGB 12.1* 11.6*  --   HCT 37.1* 35.2*  --   PLT 200 179  --   LABPROT 19.9*  --   --   INR 1.67*  --   --   HEPARINUNFRC  --   --  0.17*  CREATININE 2.03* 1.65*  --     Estimated Creatinine Clearance: 25.4 mL/min (by C-G formula based on Cr of 1.65).   Medical History: Past Medical History  Diagnosis Date  . CAD (coronary artery disease)     a. LHC 4/06 with 60% pLAD, 40% ramus, 60% mRCA, and 90% mPDA. No intervention. Inferior akinesis on LV-gram was suggestive of possible inferior MI with recanalization.  . Hyperlipidemia     a. Myalgias with Lipitor and Crestor.  . Hypothyroidism   . Peripheral vascular disease   . Anxiety   . Hx of colonic polyps   . Paroxysmal atrial fibrillation 02/2009     a. With RVR tx with amiodarone returning to NSR 02/2009. b. Had atypical aflutter vs atrial tach 4/14, converted to NSR 4/14. In atypical flutter vs atrial tach in 1/15, DCCV 1/15 to NSR. c. Atrial tach 7/15 with junctional escape 60s.  . Systolic CHF     a.  Probable ischemic cardiomyopathy. Echo (6/10) with EF 40%, mildly dilated LV, moderate MR. ;  b.  echo 3/12: mild LVH, EF 30-35%, IL HK and Lat HK, mod to severe MR, trivial AI, mod LAE  . PNA (pneumonia)     History of   . Mitral regurgitation     a.  Moderate by echo 6/10. b. 2-3+ by cath 2006; mod to severe by echo 3/12  . PVC's (premature ventricular contractions)   . Cataract   . Heart murmur   . CKD (chronic kidney disease), stage III   . Skin cancer     Hx of 2008 BCC  . Chronic indwelling Foley catheter     per family, bladder muscles no longer work  . Depression   . Myocardial infarction   . Basal cell carcinoma   . Paroxysmal atrial tachycardia     a. See details under PAF.  Marland Kitchen Paroxysmal atrial flutter     a. See details under PAF.  Marland Kitchen BPH (benign prostatic hyperplasia)   . Recurrent UTI   . Imbalance   . Carotid stenosis     a. Dopplers 2/15 - 40-59% BICA.    Medications:  Prescriptions prior to admission  Medication Sig Dispense Refill Last Dose  . amiodarone (PACERONE) 200 MG tablet Take 1 tablet (200 mg total) by mouth 2 (two) times daily. 180 tablet 3 08/20/2014 at am  . amoxicillin-clavulanate (AUGMENTIN) 875-125 MG per tablet Take 1 tablet by mouth 2 (two) times daily. (Patient taking differently: Take 1 tablet by mouth 2 (  two) times daily. 10 day course started 08/19/14 pm) 20 tablet 0 08/20/2014 at am  . Coenzyme Q10 (COQ10 PO) Take 1 tablet by mouth daily.   08/20/2014 at Unknown time  . digoxin (LANOXIN) 0.125 MG tablet Take 0.5 tablets (0.0625 mg total) by mouth every other day. (Patient taking differently: Take 0.0625 mg by mouth every other day. Takes at noon) 15 tablet 3 08/20/2014 at Unknown time  . furosemide (LASIX) 40 MG tablet Take 1 tab in AM and 1/2 tab in PM (Patient taking differently: Take 20-40 mg by mouth 2 (two) times daily. Take 1 tablet (40 mg) every morning and 1/2 tablet (20 mg) every night) 45 tablet 3 08/20/2014 at am  . levothyroxine (SYNTHROID, LEVOTHROID) 100 MCG tablet Take 1 tablet (100 mcg total) by mouth daily before breakfast. 30 tablet 1 08/20/2014 at Unknown time  . lisinopril (PRINIVIL,ZESTRIL) 2.5 MG tablet Take 1 tablet (2.5 mg total) by mouth daily. (Patient taking  differently: Take 2.5 mg by mouth daily at 3 pm. ) 90 tablet 0 08/20/2014 at Unknown time  . metoprolol succinate (TOPROL XL) 25 MG 24 hr tablet Take 1 tablet (25 mg total) by mouth daily. 30 tablet 6 08/20/2014 at 1000  . MILK THISTLE PO Take 325 mg by mouth 3 (three) times daily.   08/20/2014 at Unknown time  . Multiple Vitamin (MULTIVITAMIN WITH MINERALS) TABS tablet Take 1 tablet by mouth daily.   08/20/2014 at Unknown time  . Omega-3 Fatty Acids (FISH OIL) 1200 MG CAPS Take 1 capsule (1,200 mg total) by mouth daily. 90 capsule 3 08/20/2014 at Unknown time  . potassium chloride (K-DUR,KLOR-CON) 10 MEQ tablet Take 20 mEq by mouth daily.   08/20/2014 at Unknown time  . tamsulosin (FLOMAX) 0.4 MG CAPS capsule Take 0.4 mg by mouth at bedtime.    08/19/2014 at Unknown time  . warfarin (COUMADIN) 1 MG tablet Take 1-2 tablets (1-2 mg total) by mouth daily. As directed (Patient taking differently: Take 1 mg by mouth every evening. 5pm) 35 tablet 3 08/20/2014 at Unknown time  . zolpidem (AMBIEN) 10 MG tablet Take 10 mg by mouth at bedtime.   08/19/2014 at Unknown time  . zolpidem (AMBIEN) 10 MG tablet TAKE 1 TABLET BY MOUTH AT BEDTIME AS NEEDED FOR SLEEP (Patient not taking: Reported on 08/20/2014) 30 tablet 3 Not Taking at Unknown time    Assessment: 80 yo lady to start coumadin for VTE px s/p R hip hemiarthroplasty.  She was on coumadin PTA for afib.   Goal of Therapy:  INR 2-3 Monitor platelets by anticoagulation protocol: Yes   Plan:  Coumadin 1.5 mg po today Daily protime Monitor for bleeding  Trevonn Hallum Poteet 08/21/2014,3:59 PM

## 2014-08-21 NOTE — Plan of Care (Signed)
Problem: Phase I Progression Outcomes Goal: Pre op pain controlled with appropriate interventions Outcome: Completed/Met Date Met:  08/21/14 Goal: Pre op Medical MD consult, if indicated Outcome: Completed/Met Date Met:  08/21/14 Goal: Pre op Protime within normal limits Outcome: Completed/Met Date Met:  08/21/14 Goal: Pre op-initial discharge plan identified Outcome: Completed/Met Date Met:  08/21/14 Goal: Post op pain controlled with appropriate interventions Outcome: Completed/Met Date Met:  08/21/14 Goal: Post op hemodynamically stable Outcome: Completed/Met Date Met:  08/21/14 Goal: Post op CMS/Neurovascular status WDL Outcome: Completed/Met Date Met:  08/21/14 Goal: Post op clear liquids, advance diet as tolerated Outcome: Completed/Met Date Met:  08/21/14 Goal: Other Phase I Outcomes/Goals Outcome: Completed/Met Date Met:  08/21/14

## 2014-08-21 NOTE — Op Note (Signed)
NAMEBUCKLEY, Nathan NO.:  [id number]  MEDICAL RECORD NO.:  [id number]  LOCATION:  [facility]                         FACILITY:  [place] [facility]  PHYSICIAN:  Lind Guest. Ninfa Linden, M.D.DATE OF BIRTH:  29-Jan-1926  DATE OF PROCEDURE:  08/21/2014 DATE OF DISCHARGE:                              OPERATIVE REPORT   PREOPERATIVE DIAGNOSIS:  Displaced right hip femoral neck fracture.  POSTOPERATIVE DIAGNOSIS:  Displaced right hip femoral neck fracture.  PROCEDURE:  Right hip hemiarthroplasty.  IMPLANTS:  DePuy Summit Basic press-fit stem with a collar size 5, size 49+ 0 unipolar hip ball.  SURGEON:  Lind Guest. Ninfa Linden, M.D.  ANESTHESIA:  General.  ANTIBIOTICS:  2 g IV Ancef.  BLOOD LOSS:  Less than 100 mL.  COMPLICATIONS:  None.  INDICATIONS:  [name] is an 80 year old gentleman who sustained a mechanical fall yesterday.  He is very frail.  He tripped on a curb. He was brought to the [facility] Emergency Room and found to have suffered a right hip femoral neck fracture with displacement.  He was admitted to the [facility] Medicine Teaching Service.  He was seen by Cardiology and the Medicine Service for clearing for surgery.  He is certainly of intermediate risk for this surgery, but he is a Hydrographic surveyor.  His family interprets for him and he understands the risks and benefits of the surgery and the reason behind proceeding with surgery.  PROCEDURE DESCRIPTION:  After informed consent was obtained, appropriate right hip was marked.  He was brought to the operating room and general anesthesia was obtained while he was on the stretcher.  He was then placed supine on the operating table and turned into a lateral decubitus position with the right operative hip up and axillary roll in place and padding of the down on operative arms and legs as well as positioning of the head and neck appropriately.  His right operative hip was then prepped and draped with  DuraPrep and sterile drapes.  A time-out was called to identify correct patient, correct right hip.  We then made an incision over the greater trochanter and carried this proximally and distally.  I dissected down the iliotibial band and divided the iliotibial band longitudinally.  Next, I proceeded with an anterolateral approach to the hip.  I took down a small sleeve of gluteus medius and minimus tendon off the greater trochanter and plugged this anteriorly. I was able to dissect down the hip capsule and opened up the hip capsule finding a large hematoma.  I then made my femoral neck cut with an oscillating saw and completed this with an osteotome.  I removed remnants of the femoral neck.  I then placed a corkscrew guide in the femoral head removed the femoral head in its entirety, measured it to be appropriately about a size 49.  I then cleaned the acetabular debris.  I then turned attention to the femur.  With the leg flexed and externally rotated off the table, I was able to use initiating reamer to open up the femoral canal and a canal finding guide.  I then used a lateralizing reamer and began broaching from a size 1 broach up to a size 5.  With a size 5, I felt it to be a tight fit.  I trialed a 49 head with a +0 taper spacer and reduced this in the acetabulum.  His leg lengths were equal and he was stable.  I dislocated the hip and removed the trial components and then placed the real DePuy Summit Basic press-fit stem size 5, the real 49 unipolar head with +0 taper space and reduced this back in the acetabulum.  Again, it was stable.  I copiously irrigated the soft tissues with normal saline solution and closed the joint capsule with interrupted #1 Ethibond suture.  We reapproximated the gluteus medius and minimus tendons of the greater trochanter and oversew this using a #1 Ethibond suture followed by a #1 Vicryl in the IT band; 0 Vicryl in the deep tissue, 2-0 Vicryl in the  subcutaneous tissue, and staples on the skin.  An Aquacel dressing was applied.  He was turned back into a supine position.  His leg lengths were measured equal.  He was awakened, extubated, and taken to recovery room in stable condition.     Lind Guest. Ninfa Linden, M.D.     CYB/MEDQ  D:  08/21/2014  T:  08/21/2014  Job:  [id number]

## 2014-08-21 NOTE — Anesthesia Preprocedure Evaluation (Addendum)
Anesthesia Evaluation  Patient identified by MRN, date of birth, ID band Patient awake    Reviewed: Allergy & Precautions, H&P , NPO status , Patient's Chart, lab work & pertinent test results  Airway Mallampati: III  TM Distance: >3 FB Neck ROM: Full  Mouth opening: Limited Mouth Opening  Dental  (+) Edentulous Upper, Edentulous Lower   Pulmonary neg pulmonary ROS,          Cardiovascular hypertension, + CAD and + Peripheral Vascular Disease + dysrhythmias Atrial Fibrillation + Valvular Problems/Murmurs MR  EF 35-40% on 2012 TTE with moderate MR. Multivessel CAD.   Neuro/Psych Anxiety Depression negative neurological ROS     GI/Hepatic negative GI ROS, Neg liver ROS,   Endo/Other  Hypothyroidism   Renal/GU CRF and ARFRenal disease     Musculoskeletal negative musculoskeletal ROS (+)   Abdominal   Peds  Hematology  (+) anemia ,   Anesthesia Other Findings   Reproductive/Obstetrics                            Anesthesia Physical Anesthesia Plan  ASA: III  Anesthesia Plan: General   Post-op Pain Management:    Induction: Intravenous  Airway Management Planned: Oral ETT  Additional Equipment: Arterial line  Intra-op Plan:   Post-operative Plan: Extubation in OR  Informed Consent: I have reviewed the patients History and Physical, chart, labs and discussed the procedure including the risks, benefits and alternatives for the proposed anesthesia with the patient or authorized representative who has indicated his/her understanding and acceptance.   Dental advisory given  Plan Discussed with: CRNA and Surgeon  Anesthesia Plan Comments:         Anesthesia Quick Evaluation

## 2014-08-21 NOTE — Progress Notes (Signed)
Patient ID: [name], male   DOB: 01-17-1926, 81 y.o.   MRN: [id number] X-rays reveal a displaced right hip femoral neck fracture. A right hip hemiarthroplasty will be recommended.  Full consult note to follow.  Will plan on surgery possibly today if clear medically.

## 2014-08-21 NOTE — Transfer of Care (Signed)
Immediate Anesthesia Transfer of Care Note  Patient: [name]  Procedure(s) Performed: Procedure(s): ARTHROPLASTY BIPOLAR HIP (Right)  Patient Location: PACU  Anesthesia Type:General  Level of Consciousness: awake, alert  and oriented  Airway & Oxygen Therapy: Patient Spontanous Breathing and Patient connected to face mask oxygen  Post-op Assessment: Report given to PACU RN  Post vital signs: Reviewed and stable  Complications: No apparent anesthesia complications

## 2014-08-21 NOTE — Progress Notes (Signed)
ANTICOAGULATION CONSULT NOTE - Follow Up Consult  Pharmacy Consult for Heparin Indication: atrial fibrillation  Allergies  Allergen Reactions  . Ciprofloxacin Nausea And Vomiting and Other (See Comments)    Possible tendinopathy   . Ezetimibe-Simvastatin Other (See Comments)     spasms  . Rosuvastatin Other (See Comments)    Leg spasms  . Atenolol Rash    Patient Measurements: Height: 5' 2.5" (158.8 cm) Weight: 137 lb 4.8 oz (62.279 kg) IBW/kg (Calculated) : 55.75 Heparin Dosing Weight: 59 kg  Vital Signs: Temp: 98.5 F (36.9 C) (11/28 1545) Temp Source: Oral (11/28 1545) BP: 110/56 mmHg (11/28 1545) Pulse Rate: 55 (11/28 1545)  Labs:  Recent Labs  08/20/14 1934 08/21/14 0320 08/21/14 0925  HGB 12.1* 11.6*  --   HCT 37.1* 35.2*  --   PLT 200 179  --   LABPROT 19.9*  --   --   INR 1.67*  --   --   HEPARINUNFRC  --   --  0.17*  CREATININE 2.03* 1.65*  --     Estimated Creatinine Clearance: 25.4 mL/min (by C-G formula based on Cr of 1.65).   Medications:  Scheduled:  . amiodarone  200 mg Oral BID  . amoxicillin-clavulanate  1 tablet Oral BID  . ceFAZolin      .  ceFAZolin (ANCEF) IV  2 g Intravenous Once  . digoxin  0.0625 mg Oral QODAY  . fentaNYL      . furosemide  40 mg Oral BID  . levothyroxine  100 mcg Oral QAC breakfast  . metoprolol succinate  12.5 mg Oral BID  . potassium chloride  20 mEq Oral Daily  . tamsulosin  0.4 mg Oral QHS  . zolpidem  5 mg Oral QHS   Infusions:  . heparin Stopped (08/21/14 1242)   Assessment: 64 yom with a past medical history of atrial fibrillation presenting with femur fracture.  Patient is on warfarin PTA to transition to heparin for procedure for arthoplasty of right hip.    Goal of Therapy:  INR 2-3 Monitor platelets by anticoagulation protocol: Yes   Plan:  - Increase hepatin rate to 1050 units/hr - F/U HL - Daily CBC - Monitor for s/sx of bleeding  Hassie Bruce, Pharm. D. Clinical Pharmacy  Resident Pager: [phone] Ph: [phone] 08/21/2014 3:59 PM

## 2014-08-21 NOTE — Plan of Care (Signed)
Problem: Phase I Progression Outcomes Goal: Pre op labs/procedures/consults per MD order Outcome: Completed/Met Date Met:  08/21/14 Goal: Pre op NPO per MD orders Outcome: Completed/Met Date Met:  08/21/14 Goal: Pre op hemodynamically stable Outcome: Completed/Met Date Met:  08/21/14

## 2014-08-21 NOTE — Consult Note (Signed)
Reason for Consult:  Right hip fracture Referring Physician:   EDP  [name] is an 80 y.o. male.  HPI:   80 yo male who sustained a right hip fracture after an accidental mechanical fall.  Was admitted to the [facility] Teaching Service given his age and multiple medical problems.  He is awake and alert and does follow commands appropriately while his son-n-law interprets at the bedside.  He does reports right hip pain.  I did go over the x-rays with the family.  Past Medical History  Diagnosis Date  . CAD (coronary artery disease)     a. LHC 4/06 with 60% pLAD, 40% ramus, 60% mRCA, and 90% mPDA. No intervention. Inferior akinesis on LV-gram was suggestive of possible inferior MI with recanalization.  . Hyperlipidemia     a. Myalgias with Lipitor and Crestor.  . Hypothyroidism   . Peripheral vascular disease   . Anxiety   . Hx of colonic polyps   . Paroxysmal atrial fibrillation 02/2009     a. With RVR tx with amiodarone returning to NSR 02/2009. b. Had atypical aflutter vs atrial tach 4/14, converted to NSR 4/14. In atypical flutter vs atrial tach in 1/15, DCCV 1/15 to NSR. c. Atrial tach 7/15 with junctional escape 60s.  . Systolic CHF     a.  Probable ischemic cardiomyopathy. Echo (6/10) with EF 40%, mildly dilated LV, moderate MR. ;  b.  echo 3/12: mild LVH, EF 30-35%, IL HK and Lat HK, mod to severe MR, trivial AI, mod LAE  . PNA (pneumonia)     History of   . Mitral regurgitation     a. Moderate by echo 6/10. b. 2-3+ by cath 2006; mod to severe by echo 3/12  . PVC's (premature ventricular contractions)   . Cataract   . Heart murmur   . CKD (chronic kidney disease), stage III   . Skin cancer     Hx of 2008 BCC  . Chronic indwelling Foley catheter     per family, bladder muscles no longer work  . Depression   . Myocardial infarction   . Basal cell carcinoma   . Paroxysmal atrial tachycardia     a. See details under PAF.  Marland Kitchen Paroxysmal atrial flutter     a. See  details under PAF.  Marland Kitchen BPH (benign prostatic hyperplasia)   . Recurrent UTI   . Imbalance   . Carotid stenosis     a. Dopplers 2/15 - 40-59% BICA.    Past Surgical History  Procedure Laterality Date  . Skin cancer excision  2008    shoulder and nose   . Cardioversion N/A 01/02/2013    Procedure: CARDIOVERSION;  Surgeon: Larey Dresser, MD;  Location: [place];  Service: Cardiovascular;  Laterality: N/A;  . Cardioversion N/A 10/23/2013    Procedure: CARDIOVERSION;  Surgeon: Larey Dresser, MD;  Location: [facility] ENDOSCOPY;  Service: Cardiovascular;  Laterality: N/A;    Family History  Problem Relation Age of Onset  . Coronary artery disease Other     male, 1st degree relative <60   . Heart disease Mother   . Heart attack Mother     Social History:  reports that he has never smoked. He does not have any smokeless tobacco history on file. He reports that he does not drink alcohol or use illicit drugs.  Allergies:  Allergies  Allergen Reactions  . Ciprofloxacin Nausea And Vomiting and Other (See Comments)    Possible tendinopathy   .  Ezetimibe-Simvastatin Other (See Comments)     spasms  . Rosuvastatin Other (See Comments)    Leg spasms  . Atenolol Rash    Medications: I have reviewed the patient's current medications.  Results for orders placed or performed during the hospital encounter of 08/20/14 (from the past 48 hour(s))  Basic metabolic panel     Status: Abnormal   Collection Time: 08/20/14  7:34 PM  Result Value Ref Range   Sodium 137 137 - 147 mEq/L   Potassium 4.5 3.7 - 5.3 mEq/L   Chloride 99 96 - 112 mEq/L   CO2 24 19 - 32 mEq/L   Glucose, Bld 116 (H) 70 - 99 mg/dL   BUN 35 (H) 6 - 23 mg/dL   Creatinine, Ser 2.03 (H) 0.50 - 1.35 mg/dL   Calcium 8.4 8.4 - 10.5 mg/dL   GFR calc non Af Amer 28 (L) >90 mL/min   GFR calc Af Amer 32 (L) >90 mL/min    Comment: (NOTE) The eGFR has been calculated using the CKD EPI equation. This calculation has not been  validated in all clinical situations. eGFR's persistently <90 mL/min signify possible Chronic Kidney Disease.    Anion gap 14 5 - 15  CBC WITH DIFFERENTIAL     Status: Abnormal   Collection Time: 08/20/14  7:34 PM  Result Value Ref Range   WBC 9.7 4.0 - 10.5 K/uL   RBC 4.02 (L) 4.22 - 5.81 MIL/uL   Hemoglobin 12.1 (L) 13.0 - 17.0 g/dL   HCT 37.1 (L) 39.0 - 52.0 %   MCV 92.3 78.0 - 100.0 fL   MCH 30.1 26.0 - 34.0 pg   MCHC 32.6 30.0 - 36.0 g/dL   RDW 15.0 11.5 - 15.5 %   Platelets 200 150 - 400 K/uL   Neutrophils Relative % 66 43 - 77 %   Neutro Abs 6.4 1.7 - 7.7 K/uL   Lymphocytes Relative 24 12 - 46 %   Lymphs Abs 2.3 0.7 - 4.0 K/uL   Monocytes Relative 10 3 - 12 %   Monocytes Absolute 1.0 0.1 - 1.0 K/uL   Eosinophils Relative 0 0 - 5 %   Eosinophils Absolute 0.0 0.0 - 0.7 K/uL   Basophils Relative 0 0 - 1 %   Basophils Absolute 0.0 0.0 - 0.1 K/uL  Protime-INR     Status: Abnormal   Collection Time: 08/20/14  7:34 PM  Result Value Ref Range   Prothrombin Time 19.9 (H) 11.6 - 15.2 seconds   INR 1.67 (H) 0.00 - 1.49  Type and screen     Status: None   Collection Time: 08/20/14  7:36 PM  Result Value Ref Range   ABO/RH(D) A POS    Antibody Screen NEG    Sample Expiration 08/23/2014   ABO/Rh     Status: None   Collection Time: 08/20/14  7:36 PM  Result Value Ref Range   ABO/RH(D) A POS   Urinalysis, Routine w reflex microscopic     Status: Abnormal   Collection Time: 08/21/14 12:57 AM  Result Value Ref Range   Color, Urine YELLOW YELLOW   APPearance CLEAR CLEAR   Specific Gravity, Urine 1.011 1.005 - 1.030   pH 5.0 5.0 - 8.0   Glucose, UA NEGATIVE NEGATIVE mg/dL   Hgb urine dipstick SMALL (A) NEGATIVE   Bilirubin Urine NEGATIVE NEGATIVE   Ketones, ur NEGATIVE NEGATIVE mg/dL   Protein, ur NEGATIVE NEGATIVE mg/dL   Urobilinogen, UA 0.2 0.0 - 1.0  mg/dL   Nitrite NEGATIVE NEGATIVE   Leukocytes, UA NEGATIVE NEGATIVE  Urine microscopic-add on     Status: Abnormal    Collection Time: 08/21/14 12:57 AM  Result Value Ref Range   Squamous Epithelial / LPF RARE RARE   WBC, UA 3-6 <3 WBC/hpf   RBC / HPF 0-2 <3 RBC/hpf   Bacteria, UA RARE RARE   Casts GRANULAR CAST (A) NEGATIVE    Comment: HYALINE CASTS  Comprehensive metabolic panel     Status: Abnormal   Collection Time: 08/21/14  3:20 AM  Result Value Ref Range   Sodium 137 137 - 147 mEq/L   Potassium 4.1 3.7 - 5.3 mEq/L   Chloride 101 96 - 112 mEq/L   CO2 22 19 - 32 mEq/L   Glucose, Bld 102 (H) 70 - 99 mg/dL   BUN 31 (H) 6 - 23 mg/dL   Creatinine, Ser 1.65 (H) 0.50 - 1.35 mg/dL   Calcium 8.3 (L) 8.4 - 10.5 mg/dL   Total Protein 6.9 6.0 - 8.3 g/dL   Albumin 3.0 (L) 3.5 - 5.2 g/dL   AST 50 (H) 0 - 37 U/L   ALT 37 0 - 53 U/L   Alkaline Phosphatase 57 39 - 117 U/L   Total Bilirubin 0.7 0.3 - 1.2 mg/dL   GFR calc non Af Amer 36 (L) >90 mL/min   GFR calc Af Amer 42 (L) >90 mL/min    Comment: (NOTE) The eGFR has been calculated using the CKD EPI equation. This calculation has not been validated in all clinical situations. eGFR's persistently <90 mL/min signify possible Chronic Kidney Disease.    Anion gap 14 5 - 15  CBC     Status: Abnormal   Collection Time: 08/21/14  3:20 AM  Result Value Ref Range   WBC 12.7 (H) 4.0 - 10.5 K/uL   RBC 3.90 (L) 4.22 - 5.81 MIL/uL   Hemoglobin 11.6 (L) 13.0 - 17.0 g/dL   HCT 35.2 (L) 39.0 - 52.0 %   MCV 90.3 78.0 - 100.0 fL   MCH 29.7 26.0 - 34.0 pg   MCHC 33.0 30.0 - 36.0 g/dL   RDW 15.1 11.5 - 15.5 %   Platelets 179 150 - 400 K/uL    Dg Pelvis 1-2 Views  08/20/2014   CLINICAL DATA:  Fall  EXAM: PELVIS - 1-2 VIEW  COMPARISON:  None.  FINDINGS: Right femoral neck fracture with mild displacement. Hip joint appears normal bilaterally. No other fracture identified.  IMPRESSION: Right femoral neck fracture.   Electronically Signed   By: Franchot Gallo M.D.   On: 08/20/2014 20:20   Dg Wrist Complete Right  08/20/2014   CLINICAL DATA:  Acute right wrist  pain after fall.  EXAM: RIGHT WRIST - COMPLETE 3+ VIEW  COMPARISON:  None.  FINDINGS: There is no evidence of fracture or dislocation. Narrowing and sclerosis is seen involving the joint space between the scaphoid and trapezium, as well as the first carpometacarpal joint. Soft tissues are unremarkable.  IMPRESSION: Findings consistent with osteoarthritis as described above. No acute abnormality seen in the right wrist.   Electronically Signed   By: Sabino Dick M.D.   On: 08/20/2014 20:23   Dg Femur Right  08/20/2014   CLINICAL DATA:  Fall with right hip pain.  EXAM: RIGHT FEMUR - 2 VIEW  COMPARISON:  None.  FINDINGS: There is a fracture of the proximal right femur. Findings are consistent with a subcapital fracture. Mild elevation of the distal  fragment. The right femoral head is located. No acute bone abnormality in the mid and distal femur.  IMPRESSION: Subcapital right femoral fracture.   Electronically Signed   By: Markus Daft M.D.   On: 08/20/2014 20:22   Ct Head Wo Contrast  08/20/2014   CLINICAL DATA:  Fall  EXAM: CT HEAD WITHOUT CONTRAST  TECHNIQUE: Contiguous axial images were obtained from the base of the skull through the vertex without intravenous contrast.  COMPARISON:  None.  FINDINGS: Moderate atrophy. Chronic microvascular ischemic changes in the white matter. Chronic infarct right cerebellum.  Negative for acute infarct. Negative for hemorrhage or mass. No shift of the midline structures. Negative for fracture.  IMPRESSION: Atrophy and chronic ischemia.  No acute abnormality.   Electronically Signed   By: Franchot Gallo M.D.   On: 08/20/2014 20:37    ROS Blood pressure 111/57, pulse 51, temperature 98.5 F (36.9 C), temperature source Oral, resp. rate 20, height 5' 2.5" (1.588 m), weight 62.279 kg (137 lb 4.8 oz), SpO2 96 %. Physical Exam  Musculoskeletal:       Right hip: He exhibits decreased range of motion, decreased strength, bony tenderness and deformity.  He has significant  pain on attempts of right hip motion. His right leg is shortened and externally rotated. He can move his toes. His foot is warm and well-perfused.    Assessment/Plan: Right hip displaced femoral neck/hip fracture 1)  A right hip hemiarthroplasty is recommended given this type of fracture.  His son-n-law interprets for him and he understands fully the risks and benefits of surgery given his situation 2)  Will plan on surgery later today with the goals being decreased pain, improved mobility, and improved quality of life given this injury.  Hopefully this will advert the risks of DVT, bed sores, pneumonia, and other complications associated with non-surgical treatment of hip fractures.  Cledis Sohn Y 08/21/2014, 7:22 AM

## 2014-08-21 NOTE — Brief Op Note (Signed)
08/20/2014 - 08/21/2014  2:37 PM  PATIENT:  Sherin Quarry  80 y.o. male  PRE-OPERATIVE DIAGNOSIS:  right femoral neck fracture  POST-OPERATIVE DIAGNOSIS:  same  PROCEDURE:  Procedure(s): ARTHROPLASTY BIPOLAR HIP (Right)  SURGEON:  Surgeon(s) and Role:    * Mcarthur Rossetti, MD - Primary  ANESTHESIA:   general  EBL:  Total I/O In: 0240 [I.V.:1476] Out: 600 [Urine:400; Blood:200]  BLOOD ADMINISTERED:none  DRAINS: none   LOCAL MEDICATIONS USED:  NONE  SPECIMEN:  No Specimen  DISPOSITION OF SPECIMEN:  N/A  COUNTS:  YES  TOURNIQUET:  * No tourniquets in log *  DICTATION: .Other Dictation: Dictation Number [id number]  PLAN OF CARE: Admit to inpatient   PATIENT DISPOSITION:  PACU - hemodynamically stable.   Delay start of Pharmacological VTE agent (>24hrs) due to surgical blood loss or risk of bleeding: no

## 2014-08-21 NOTE — Consult Note (Signed)
CARDIOLOGY CONSULT NOTE   Patient ID: [name] MRN: [id number] DOB/AGE: 12/17/1925 80 y.o.  Admit Date: 08/20/2014  Primary Physician: [name], MD  Primary Cardiologist     [facility]   Clinical Summary [name] is a 80 y.o.male. He is an elderly frail gentleman from [place]. He does not speak [place]. His family speaks well and I have talked with them. He slipped and fell and fractured his hip. Hemodynamically he is stable. We are consulting to see if he can be cleared for hip surgery. I went to see him as soon as I was called, because orthopedics post proceed today if he is cleared. The patient has known CHF. He also has atrial arrhythmias. He is on amiodarone. Fortunately his rhythm was stable. He has not had significant symptoms from his CHF recently. There is no chest pain. Also, he was seen in the outpatient clinic recently and he was stable.   Allergies  Allergen Reactions  . Ciprofloxacin Nausea And Vomiting and Other (See Comments)    Possible tendinopathy   . Ezetimibe-Simvastatin Other (See Comments)     spasms  . Rosuvastatin Other (See Comments)    Leg spasms  . Atenolol Rash    Medications Scheduled Medications: . amiodarone  200 mg Oral BID  . amoxicillin-clavulanate  1 tablet Oral BID  . digoxin  0.0625 mg Oral QODAY  . furosemide  40 mg Oral BID  . levothyroxine  100 mcg Oral QAC breakfast  . potassium chloride  20 mEq Oral Daily  . tamsulosin  0.4 mg Oral QHS  . zolpidem  5 mg Oral QHS     Infusions: . heparin 850 Units/hr (08/21/14 0114)     PRN Medications:  acetaminophen **OR** acetaminophen, HYDROcodone-acetaminophen   Past Medical History  Diagnosis Date  . CAD (coronary artery disease)     a. LHC 4/06 with 60% pLAD, 40% ramus, 60% mRCA, and 90% mPDA. No intervention. Inferior akinesis on LV-gram was suggestive of possible inferior MI with recanalization.  . Hyperlipidemia     a. Myalgias with Lipitor and  Crestor.  . Hypothyroidism   . Peripheral vascular disease   . Anxiety   . Hx of colonic polyps   . Paroxysmal atrial fibrillation 02/2009     a. With RVR tx with amiodarone returning to NSR 02/2009. b. Had atypical aflutter vs atrial tach 4/14, converted to NSR 4/14. In atypical flutter vs atrial tach in 1/15, DCCV 1/15 to NSR. c. Atrial tach 7/15 with junctional escape 60s.  . Systolic CHF     a.  Probable ischemic cardiomyopathy. Echo (6/10) with EF 40%, mildly dilated LV, moderate MR. ;  b.  echo 3/12: mild LVH, EF 30-35%, IL HK and Lat HK, mod to severe MR, trivial AI, mod LAE  . PNA (pneumonia)     History of   . Mitral regurgitation     a. Moderate by echo 6/10. b. 2-3+ by cath 2006; mod to severe by echo 3/12  . PVC's (premature ventricular contractions)   . Cataract   . Heart murmur   . CKD (chronic kidney disease), stage III   . Skin cancer     Hx of 2008 BCC  . Chronic indwelling Foley catheter     per family, bladder muscles no longer work  . Depression   . Myocardial infarction   . Basal cell carcinoma   . Paroxysmal atrial tachycardia     a. See details under PAF.  Marland Kitchen Paroxysmal  atrial flutter     a. See details under PAF.  Marland Kitchen BPH (benign prostatic hyperplasia)   . Recurrent UTI   . Imbalance   . Carotid stenosis     a. Dopplers 2/15 - 40-59% BICA.    Past Surgical History  Procedure Laterality Date  . Skin cancer excision  2008    shoulder and nose   . Cardioversion N/A 01/02/2013    Procedure: CARDIOVERSION;  Surgeon: Larey Dresser, MD;  Location: [place];  Service: Cardiovascular;  Laterality: N/A;  . Cardioversion N/A 10/23/2013    Procedure: CARDIOVERSION;  Surgeon: Larey Dresser, MD;  Location: [facility] ENDOSCOPY;  Service: Cardiovascular;  Laterality: N/A;    Family History  Problem Relation Age of Onset  . Coronary artery disease Other     male, 1st degree relative <60   . Heart disease Mother   . Heart attack Mother     Social History Mr.  [name] reports that he has never smoked. He does not have any smokeless tobacco history on file. [name] reports that he does not drink alcohol.  Review of Systems Patient history is taken through the family. He denies fever, chills, headache, sweats, rash, treated vision, change in hearing, chest pain, cough, nausea or vomiting, urinary symptoms. All other systems are reviewed and are negative.  Physical Examination Blood pressure 111/57, pulse 51, temperature 98.5 F (36.9 C), temperature source Oral, resp. rate 20, height 5' 2.5" (1.588 m), weight 137 lb 4.8 oz (62.279 kg), SpO2 96 %.  Intake/Output Summary (Last 24 hours) at 08/21/14 1107 Last data filed at 08/21/14 0932  Gross per 24 hour  Intake      0 ml  Output    675 ml  Net   -675 ml   He is thin and frail but stable and smiling. He is oriented to person time and place. Affect is normal paced on how he responds to his family. Head is atraumatic. Sclera and conjunctiva are normal. There is no jugular venous distention. Lungs are clear. Respiratory effort is not labored. Cardiac exam reveals S1 and S2. The abdomen is soft. There is no peripheral edema. He has the musculoskeletal deformity related to his fractured hip. There is no edema.  Neuropsychiatric: Alert and oriented x3, affect grossly appropriate.  Prior Cardiac Testing/Procedures  Lab Results  Basic Metabolic Panel:  Recent Labs Lab 08/20/14 1934 08/21/14 0320  NA 137 137  K 4.5 4.1  CL 99 101  CO2 24 22  GLUCOSE 116* 102*  BUN 35* 31*  CREATININE 2.03* 1.65*  CALCIUM 8.4 8.3*    Liver Function Tests:  Recent Labs Lab 08/21/14 0320  AST 50*  ALT 37  ALKPHOS 57  BILITOT 0.7  PROT 6.9  ALBUMIN 3.0*    CBC:  Recent Labs Lab 08/20/14 1934 08/21/14 0320  WBC 9.7 12.7*  NEUTROABS 6.4  --   HGB 12.1* 11.6*  HCT 37.1* 35.2*  MCV 92.3 90.3  PLT 200 179    Cardiac Enzymes: No results for input(s): CKTOTAL, CKMB, CKMBINDEX,  TROPONINI in the last 168 hours.  BNP: Invalid input(s): POCBNP   Radiology: Dg Pelvis 1-2 Views  08/20/2014   CLINICAL DATA:  Fall  EXAM: PELVIS - 1-2 VIEW  COMPARISON:  None.  FINDINGS: Right femoral neck fracture with mild displacement. Hip joint appears normal bilaterally. No other fracture identified.  IMPRESSION: Right femoral neck fracture.   Electronically Signed   By: Franchot Gallo M.D.   On:  08/20/2014 20:20   Dg Wrist Complete Right  08/20/2014   CLINICAL DATA:  Acute right wrist pain after fall.  EXAM: RIGHT WRIST - COMPLETE 3+ VIEW  COMPARISON:  None.  FINDINGS: There is no evidence of fracture or dislocation. Narrowing and sclerosis is seen involving the joint space between the scaphoid and trapezium, as well as the first carpometacarpal joint. Soft tissues are unremarkable.  IMPRESSION: Findings consistent with osteoarthritis as described above. No acute abnormality seen in the right wrist.   Electronically Signed   By: Sabino Dick M.D.   On: 08/20/2014 20:23   Dg Femur Right  08/20/2014   CLINICAL DATA:  Fall with right hip pain.  EXAM: RIGHT FEMUR - 2 VIEW  COMPARISON:  None.  FINDINGS: There is a fracture of the proximal right femur. Findings are consistent with a subcapital fracture. Mild elevation of the distal fragment. The right femoral head is located. No acute bone abnormality in the mid and distal femur.  IMPRESSION: Subcapital right femoral fracture.   Electronically Signed   By: Markus Daft M.D.   On: 08/20/2014 20:22   Ct Head Wo Contrast  08/20/2014   CLINICAL DATA:  Fall  EXAM: CT HEAD WITHOUT CONTRAST  TECHNIQUE: Contiguous axial images were obtained from the base of the skull through the vertex without intravenous contrast.  COMPARISON:  None.  FINDINGS: Moderate atrophy. Chronic microvascular ischemic changes in the white matter. Chronic infarct right cerebellum.  Negative for acute infarct. Negative for hemorrhage or mass. No shift of the midline structures.  Negative for fracture.  IMPRESSION: Atrophy and chronic ischemia.  No acute abnormality.   Electronically Signed   By: Franchot Gallo M.D.   On: 08/20/2014 20:37     ECG: His rhythm on the monitor is sinus with mild sinus bradycardia.   Impression and Recommendations  The patient certainly has increased risk due to his age and fragility and overall cardiac status. However he is stable at this time. No further cardiac workup is recommended. He is cleared for surgery from the cardiac viewpoint.    Coronary atherosclerosis     The patient has known coronary disease. However he is stable. No further workup.    SYSTOLIC HEART FAILURE, CHRONIC     Patient has chronic systolic CHF. His volume status is stable. He has not had a recent episode of worsening of his CHF. Careful attention will have to be paid to his volume status perioperatively.    Paroxysmal atrial tachycardia     He has a history of multiple atrial arrhythmias. He is stable currently on amiodarone. This is to be continued.    CKD (chronic kidney disease), stage III    Renal function is stable for him today. This will have to be followed carefully.      Femur fracture, right   Femur fracture     Hopefully this will be successfully repaired.   Daryel November, MD 08/21/2014, 11:07 AM

## 2014-08-21 NOTE — Anesthesia Postprocedure Evaluation (Signed)
  Anesthesia Post-op Note  Patient: [name]  Procedure(s) Performed: Procedure(s): ARTHROPLASTY BIPOLAR HIP (Right)  Patient Location: PACU  Anesthesia Type:General  Level of Consciousness: awake, alert  and oriented  Airway and Oxygen Therapy: Patient Spontanous Breathing  Post-op Pain: none  Post-op Assessment: Post-op Vital signs reviewed  Post-op Vital Signs: Reviewed  Last Vitals:  Filed Vitals:   08/21/14 1554  BP:   Pulse:   Temp:   Resp: 16    Complications: No apparent anesthesia complications

## 2014-08-22 DIAGNOSIS — I471 Supraventricular tachycardia: Secondary | ICD-10-CM

## 2014-08-22 DIAGNOSIS — Z96641 Presence of right artificial hip joint: Secondary | ICD-10-CM

## 2014-08-22 LAB — BASIC METABOLIC PANEL
Anion gap: 13 (ref 5–15)
BUN: 24 mg/dL — ABNORMAL HIGH (ref 6–23)
CHLORIDE: 101 meq/L (ref 96–112)
CO2: 23 meq/L (ref 19–32)
Calcium: 7.8 mg/dL — ABNORMAL LOW (ref 8.4–10.5)
Creatinine, Ser: 1.45 mg/dL — ABNORMAL HIGH (ref 0.50–1.35)
GFR calc Af Amer: 49 mL/min — ABNORMAL LOW (ref >90)
GFR calc non Af Amer: 42 mL/min — ABNORMAL LOW (ref >90)
GLUCOSE: 122 mg/dL — AB (ref 70–99)
POTASSIUM: 4.2 meq/L (ref 3.7–5.3)
SODIUM: 137 meq/L (ref 137–147)

## 2014-08-22 LAB — CBC
HCT: 33.8 % — ABNORMAL LOW (ref 39.0–52.0)
HEMOGLOBIN: 11.1 g/dL — AB (ref 13.0–17.0)
MCH: 30.6 pg (ref 26.0–34.0)
MCHC: 32.8 g/dL (ref 30.0–36.0)
MCV: 93.1 fL (ref 78.0–100.0)
Platelets: 163 10*3/uL (ref 150–400)
RBC: 3.63 MIL/uL — AB (ref 4.22–5.81)
RDW: 15.2 % (ref 11.5–15.5)
WBC: 10.8 10*3/uL — AB (ref 4.0–10.5)

## 2014-08-22 LAB — URINALYSIS, ROUTINE W REFLEX MICROSCOPIC
Bilirubin Urine: NEGATIVE
Glucose, UA: NEGATIVE mg/dL
KETONES UR: NEGATIVE mg/dL
NITRITE: NEGATIVE
Protein, ur: 30 mg/dL — AB
Specific Gravity, Urine: 1.013 (ref 1.005–1.030)
UROBILINOGEN UA: 0.2 mg/dL (ref 0.0–1.0)
pH: 5 (ref 5.0–8.0)

## 2014-08-22 LAB — URINE CULTURE
Colony Count: NO GROWTH
Culture: NO GROWTH

## 2014-08-22 LAB — PROTIME-INR
INR: 1.85 — AB (ref 0.00–1.49)
PROTHROMBIN TIME: 21.5 s — AB (ref 11.6–15.2)

## 2014-08-22 LAB — URINE MICROSCOPIC-ADD ON

## 2014-08-22 LAB — HEPARIN LEVEL (UNFRACTIONATED)
HEPARIN UNFRACTIONATED: 0.14 [IU]/mL — AB (ref 0.30–0.70)
HEPARIN UNFRACTIONATED: 0.54 [IU]/mL (ref 0.30–0.70)
Heparin Unfractionated: 0.49 IU/mL (ref 0.30–0.70)

## 2014-08-22 MED ORDER — GLYCERIN (LAXATIVE) 2.1 G RE SUPP
1.0000 | Freq: Once | RECTAL | Status: AC
  Administered 2014-08-23: 1 via RECTAL
  Filled 2014-08-22: qty 1

## 2014-08-22 MED ORDER — WARFARIN SODIUM 1 MG PO TABS
1.5000 mg | ORAL_TABLET | Freq: Once | ORAL | Status: DC
  Filled 2014-08-22: qty 1

## 2014-08-22 MED ORDER — MILK THISTLE XTRA PO CAPS
1.0000 | ORAL_CAPSULE | Freq: Three times a day (TID) | ORAL | Status: DC
  Administered 2014-08-22 – 2014-08-24 (×7): 1 via ORAL
  Filled 2014-08-22: qty 1

## 2014-08-22 MED ORDER — WARFARIN SODIUM 1 MG PO TABS
1.0000 mg | ORAL_TABLET | Freq: Once | ORAL | Status: AC
  Administered 2014-08-22: 1 mg via ORAL
  Filled 2014-08-22: qty 1

## 2014-08-22 NOTE — Progress Notes (Signed)
Family Medicine Teaching Service Daily Progress Note Intern Pager: [phone]  Patient name: [name] Medical record number: [id number] Date of birth: 09/21/1926 Age: 80 y.o. Gender: male  Primary Care Provider: [name], MD Consultants: ortho, cards Code Status: full  Assessment and Plan: [name] is a 80 y.o. male presenting with femur fracture . PMH is significant for HTN, HLD, hypothyroidism, BPH, CAD with mitral insufficiency, chronic systolic CHF, peripheral vascular disease, paroxysmal atrial flutter/fib, and CKD stage III.   Right femoral fracture - related to fall after stepping off curb - Orthopedics consulted, s/p arthroplasty (11/28) - Warfarin held for surgery, now back on with heparin bridge - D/c heparin when INR >2 - f/u PT/OT recs  Acute kidney injury on Chronic kidney disease stage III. Cr 2.03 on admission, baseline 1.4-1.7. Likely prerenal etiology.  - Resolved, Cr now back to baseline.   Recent UTI. Patient has chronic indwelling catheter.  - Started on Keflex for UTI on 11/18, his urine culture grew pansensitive Escherichia coli and Klebsiella, changed to Augmentin on 11/26 - Continue Augmentin for now - F/u urine culture  CAD, mitral insufficiency, chronic systolic CHF - No chest pain at this time, appears to be at cardiac baseline - Echo from 02/2014 with EF of 40% and moderate mitral regurg - Continue metoprolol, amiodarone, digoxin  HTN - Blood pressure stable - Continue metoprolol - will make BID per patients home dosing - Holding lisinopril in setting of AKI on CKD  A Fib/flutter - Rate controlled and appears to be in normal sinus rhythm currently - Continue metoprolol, amiodarone, digoxin  FEN/GI: KVO, regular diet Prophylaxis: Full dose pharmacologic prophylaxis as above for A. fib  Disposition: Discharge pending PT eval following surgery  Subjective:  Doing well this morning s/p surgery. Has not tried to bear weight yet.  No chest pain or shortness of breath.  Objective: Temp:  [98.3 F (36.8 C)-100.1 F (37.8 C)] 98.3 F (36.8 C) (11/29 0952) Pulse Rate:  [53-62] 57 (11/29 0952) Resp:  [15-35] 16 (11/29 0952) BP: (100-115)/(44-57) 110/55 mmHg (11/29 0952) SpO2:  [94 %-100 %] 99 % (11/29 0952) Arterial Line BP: (119)/(48) 119/48 mmHg (11/28 1444) Physical Exam: General: NAD, resting comfortably in bed Cardiovascular: bradycardic, regular rhythm Respiratory: CTAB, no rales or wheezes Abdomen: s, NT, ND Extremities: no edema MSK: Incision clean dry and intact, DP and PT pulses palpable bilaterally with good capillary refill  Laboratory:  Recent Labs Lab 08/20/14 1934 08/21/14 0320 08/22/14 0015  WBC 9.7 12.7* 10.8*  HGB 12.1* 11.6* 11.1*  HCT 37.1* 35.2* 33.8*  PLT 200 179 163    Recent Labs Lab 08/20/14 1934 08/21/14 0320 08/22/14 0015  NA 137 137 137  K 4.5 4.1 4.2  CL 99 101 101  CO2 24 22 23   BUN 35* 31* 24*  CREATININE 2.03* 1.65* 1.45*  CALCIUM 8.4 8.3* 7.8*  PROT  --  6.9  --   BILITOT  --  0.7  --   ALKPHOS  --  57  --   ALT  --  37  --   AST  --  50*  --   GLUCOSE 116* 102* 122*    Dg Pelvis 1-2 Views  08/20/2014   CLINICAL DATA:  Fall  EXAM: PELVIS - 1-2 VIEW  COMPARISON:  None.  FINDINGS: Right femoral neck fracture with mild displacement. Hip joint appears normal bilaterally. No other fracture identified.  IMPRESSION: Right femoral neck fracture.   Electronically Signed   By: Juanda Crumble  Carlis Abbott M.D.   On: 08/20/2014 20:20   Dg Wrist Complete Right  08/20/2014   CLINICAL DATA:  Acute right wrist pain after fall.  EXAM: RIGHT WRIST - COMPLETE 3+ VIEW  COMPARISON:  None.  FINDINGS: There is no evidence of fracture or dislocation. Narrowing and sclerosis is seen involving the joint space between the scaphoid and trapezium, as well as the first carpometacarpal joint. Soft tissues are unremarkable.  IMPRESSION: Findings consistent with osteoarthritis as described above. No  acute abnormality seen in the right wrist.   Electronically Signed   By: Sabino Dick M.D.   On: 08/20/2014 20:23   Dg Femur Right  08/20/2014   CLINICAL DATA:  Fall with right hip pain.  EXAM: RIGHT FEMUR - 2 VIEW  COMPARISON:  None.  FINDINGS: There is a fracture of the proximal right femur. Findings are consistent with a subcapital fracture. Mild elevation of the distal fragment. The right femoral head is located. No acute bone abnormality in the mid and distal femur.  IMPRESSION: Subcapital right femoral fracture.   Electronically Signed   By: Markus Daft M.D.   On: 08/20/2014 20:22   Ct Head Wo Contrast  08/20/2014   CLINICAL DATA:  Fall  EXAM: CT HEAD WITHOUT CONTRAST  TECHNIQUE: Contiguous axial images were obtained from the base of the skull through the vertex without intravenous contrast.  COMPARISON:  None.  FINDINGS: Moderate atrophy. Chronic microvascular ischemic changes in the white matter. Chronic infarct right cerebellum.  Negative for acute infarct. Negative for hemorrhage or mass. No shift of the midline structures. Negative for fracture.  IMPRESSION: Atrophy and chronic ischemia.  No acute abnormality.   Electronically Signed   By: Franchot Gallo M.D.   On: 08/20/2014 20:37   Pelvis Portable  08/21/2014   CLINICAL DATA:  Postop right hip arthroplasty  EXAM: PORTABLE PELVIS 1-2 VIEWS  COMPARISON:  08/20/2014  FINDINGS: Single portable view of the pelvis submitted. There is right hip prosthesis in anatomic alignment. Lateral skin staples are noted.  IMPRESSION: Right hip prosthesis in anatomic alignment.   Electronically Signed   By: Lahoma Crocker M.D.   On: 08/21/2014 16:25     Dimas Chyle, MD 08/22/2014, 11:36 AM PGY-1, [place] Intern pager: [phone], text pages welcome

## 2014-08-22 NOTE — Progress Notes (Addendum)
ANTICOAGULATION CONSULT NOTE - Follow Up Consult  Pharmacy Consult for heparin and warfarin Indication: atrial fibrillation  Allergies  Allergen Reactions  . Ciprofloxacin Nausea And Vomiting and Other (See Comments)    Possible tendinopathy   . Ezetimibe-Simvastatin Other (See Comments)     spasms  . Rosuvastatin Other (See Comments)    Leg spasms  . Atenolol Rash    Patient Measurements: Height: 5' 2.5" (158.8 cm) Weight: 137 lb 4.8 oz (62.279 kg) IBW/kg (Calculated) : 55.75 Heparin Dosing Weight: 59 kg  Vital Signs: Temp: 98.6 F (37 C) (11/29 1326) Temp Source: Oral (11/29 1326) BP: 99/44 mmHg (11/29 1326) Pulse Rate: 56 (11/29 1326)  Labs:  Recent Labs  08/20/14 1934 08/21/14 0320 08/21/14 0925 08/22/14 0015 08/22/14 0400 08/22/14 1510  HGB 12.1* 11.6*  --  11.1*  --   --   HCT 37.1* 35.2*  --  33.8*  --   --   PLT 200 179  --  163  --   --   LABPROT 19.9*  --   --  21.5*  --   --   INR 1.67*  --   --  1.85*  --   --   HEPARINUNFRC  --   --  0.17*  --  0.14* 0.54  CREATININE 2.03* 1.65*  --  1.45*  --   --     Estimated Creatinine Clearance: 28.9 mL/min (by C-G formula based on Cr of 1.45).   Medications:  Infusions:  . sodium chloride 10 mL/hr at 08/21/14 1614  . heparin 1,200 Units/hr (08/22/14 0542)    Assessment: 80 y/o male presenting with femur fracture after fall s/p arthroplasty on 11/28. He is on chronic warfarin for Afib and is on IV heparin for bridge currently.  PTA: 1 mg daily  Heparin level is therapeutic at 0.54 on 1200 units/hr. INR is subtherapeutic at 1.85. No bleeding noted, CBC is stable.  Goal of Therapy:  INR 2-3 Heparin level 0.3-0.7 units/ml Monitor platelets by anticoagulation protocol: Yes   Plan:  - Continue heparin drip at 1200 units/hr - 6 hr confirmatory heparin level - Warfarin 1 mg PO tonight - Daily heparin level, CBC and INR - Monitor for s/sx of bleeding  [facility], Pharm.D., BCPS Clinical  Pharmacist Pager: [phone] 08/22/2014 3:51 PM

## 2014-08-22 NOTE — Progress Notes (Signed)
ANTICOAGULATION CONSULT NOTE - Follow Up Consult  Pharmacy Consult for heparin Indication: atrial fibrillation  Allergies  Allergen Reactions  . Ciprofloxacin Nausea And Vomiting and Other (See Comments)    Possible tendinopathy   . Ezetimibe-Simvastatin Other (See Comments)     spasms  . Rosuvastatin Other (See Comments)    Leg spasms  . Atenolol Rash    Patient Measurements: Height: 5' 2.5" (158.8 cm) Weight: 137 lb 4.8 oz (62.279 kg) IBW/kg (Calculated) : 55.75 Heparin Dosing Weight: 59 kg  Vital Signs: Temp: 98.8 F (37.1 C) (11/29 1937) Temp Source: Oral (11/29 1937) BP: 110/46 mmHg (11/29 1937) Pulse Rate: 61 (11/29 1937)  Labs:  Recent Labs  08/20/14 1934 08/21/14 0320  08/22/14 0015 08/22/14 0400 08/22/14 1510 08/22/14 2107  HGB 12.1* 11.6*  --  11.1*  --   --   --   HCT 37.1* 35.2*  --  33.8*  --   --   --   PLT 200 179  --  163  --   --   --   LABPROT 19.9*  --   --  21.5*  --   --   --   INR 1.67*  --   --  1.85*  --   --   --   HEPARINUNFRC  --   --   < >  --  0.14* 0.54 0.49  CREATININE 2.03* 1.65*  --  1.45*  --   --   --   < > = values in this interval not displayed.  Estimated Creatinine Clearance: 28.9 mL/min (by C-G formula based on Cr of 1.45).   Medications:  Infusions:  . sodium chloride 10 mL/hr at 08/21/14 1614  . heparin 1,200 Units/hr (08/22/14 1748)    Assessment: 80 y/o male presenting with femur fracture after fall s/p arthroplasty on 11/28. He is on chronic warfarin for Afib and is on IV heparin for bridge currently.  PTA: 1 mg daily  Heparin level is therapeutic at 0.49 on 1200 units/hr. INR is subtherapeutic at 1.85. No bleeding noted, CBC is stable.  Goal of Therapy:  INR 2-3 Heparin level 0.3-0.7 units/ml Monitor platelets by anticoagulation protocol: Yes   Plan:  - Continue heparin drip at 1200 units/hr - Daily heparin level, CBC and INR - Monitor for s/sx of bleeding  Uvaldo Rising, BCPS  Clinical  Pharmacist Pager [phone]  08/22/2014 9:42 PM

## 2014-08-22 NOTE — Plan of Care (Signed)
Problem: Phase II Progression Outcomes Goal: CMS/Neurovascular status WDL Outcome: Completed/Met Date Met:  08/22/14 Goal: Tolerating diet Outcome: Completed/Met Date Met:  08/22/14 Goal: Bed to chair Outcome: Completed/Met Date Met:  08/22/14 Goal: Discharge plan established Outcome: Completed/Met Date Met:  08/22/14 Goal: Other Phase II Outcomes/Goals Outcome: Completed/Met Date Met:  08/22/14

## 2014-08-22 NOTE — Evaluation (Addendum)
Physical Therapy Evaluation Patient Details Name: [name] MRN: [id number] DOB: June 30, 1926 Today's Date: 08/22/2014   History of Present Illness  Patient is a 80 y/o male s/p Right hip hemiarthroplasty secondary to mechanical fall. WBAT RLE. PMH of CAD, PVD, A-fib, CHF, mitral regurgitation, CKD, indwelling foley catheter and MI.     Clinical Impression  Patient presents with post surgical deficits of RLE secondary to above surgery. Biggest limiting factors today are pain and weakness throughout BLEs. Requires 2 person assist for transfers. Would benefit from taking pain medication prior to PT treatment to allow for more tolerance of activity. Reviewed HEP and precautions. Son-in-law interprets during session. Discussed that pt would benefit from ST SNF at discharge. Pt would benefit from skilled PT to improve transfers, gait, balance and mobility so pt can maximize independence, minimize fall risk and ease burden of care prior to return home.    Follow Up Recommendations SNF;Supervision/Assistance - 24 hour    Equipment Recommendations  Other (comment) (defer to SNF.)    Recommendations for Other Services       Precautions / Restrictions Precautions Precautions: Fall;Anterior Hip Precaution Booklet Issued: Yes (comment) Precaution Comments: Reviewed precautions and HEP.  Restrictions Weight Bearing Restrictions: Yes RLE Weight Bearing: Weight bearing as tolerated      Mobility  Bed Mobility Overal bed mobility: Needs Assistance;+2 for physical assistance Bed Mobility: Rolling;Sidelying to Sit Rolling: Max assist;+2 for physical assistance Sidelying to sit: Max assist;+2 for physical assistance;HOB elevated       General bed mobility comments: VC's for sequencing, hand placement and technique. Increased time and effort. Use of chuck pad to assist with scooting bottom to EOB.   Transfers Overall transfer level: Needs assistance Equipment used: Rolling walker (2  wheeled);None Transfers: Sit to/from Stand Sit to Stand: Max assist;+2 physical assistance;Total assist         General transfer comment: Stood from EOB x2 with RW for support, able to clear bottom first attempt but not fully upright, able to stand upright on second attempt with manual cues for hip/knee extension and verbal cues for upright posture. Bil knee buckling. SPT bed to chair Max-total A of 2. SPT chair<->BSC Max A of 2 - able to shuffle feet with Max manual cues for weightshifting with bil knee buckling. Manual cues for hand placement and technique.   Ambulation/Gait         Gait velocity: Deferred      Stairs            Wheelchair Mobility    Modified Rankin (Stroke Patients Only)       Balance Overall balance assessment: Needs assistance;History of Falls Sitting-balance support: Feet supported;Bilateral upper extremity supported Sitting balance-Leahy Scale: Poor Sitting balance - Comments: Pt with Min-Mod A for support to maintain static sitting balance EOB. Left lateral lean as position of comfort due to pain in right hip. Postural control: Left lateral lean Standing balance support: During functional activity Standing balance-Leahy Scale: Zero Standing balance comment: Requires assist of 2 to stand and for pivoting.                             Pertinent Vitals/Pain Pain Assessment: Faces Faces Pain Scale: Hurts whole lot Pain Location: right hip Pain Descriptors / Indicators: Sharp;Sore Pain Intervention(s): Limited activity within patient's tolerance;Monitored during session;Repositioned;Premedicated before session    Home Living Family/patient expects to be discharged to:: Skilled nursing facility Living Arrangements: Spouse/significant other  Prior Function Level of Independence: Needs assistance   Gait / Transfers Assistance Needed: Per son-in-law, pt has someone close by during community ambulation (holds  their hand) for support. Multiple falls at home.  ADL's / Homemaking Assistance Needed: Supervision for bathing.         Hand Dominance        Extremity/Trunk Assessment   Upper Extremity Assessment: Defer to OT evaluation;Generalized weakness           Lower Extremity Assessment: RLE deficits/detail;Generalized weakness;LLE deficits/detail RLE Deficits / Details: Limited AROM hip flexion, knee flexion/extension. Ankle AROM WFL. Knee buckling upon standing BLEs. LLE Deficits / Details: Generally weak - knee buckling present upon standing.     Communication   Communication: Interpreter utilized (Son-in-law interprets. Form signed to waive interpreter.)  Cognition Arousal/Alertness: Awake/alert Behavior During Therapy: WFL for tasks assessed/performed Overall Cognitive Status: Within Functional Limits for tasks assessed       Memory: Decreased recall of precautions              General Comments General comments (skin integrity, edema, etc.): Lengthy discussion with patient and family about disposition plans, goals of therapy. Education provided on ST SNF, hip precautions and HEP.    Exercises Total Joint Exercises Ankle Circles/Pumps: Both;15 reps;Seated Quad Sets: Both;15 reps;Seated (3-5 sec hold) Gluteal Sets: Both;10 reps;Seated      Assessment/Plan    PT Assessment Patient needs continued PT services  PT Diagnosis Generalized weakness;Acute pain   PT Problem List Pain;Decreased strength;Decreased range of motion;Decreased activity tolerance;Decreased balance;Decreased mobility;Decreased knowledge of precautions;Decreased knowledge of use of DME  PT Treatment Interventions Balance training;Gait training;DME instruction;Patient/family education;Functional mobility training;Therapeutic activities;Therapeutic exercise   PT Goals (Current goals can be found in the Care Plan section) Acute Rehab PT Goals Patient Stated Goal: none stated PT Goal Formulation:  With patient/family Time For Goal Achievement: 09/05/14 Potential to Achieve Goals: Fair    Frequency Min 4X/week   Barriers to discharge Decreased caregiver support      Co-evaluation               End of Session Equipment Utilized During Treatment: Gait belt Activity Tolerance: Patient limited by pain Patient left: in chair;with call bell/phone within reach;with family/visitor present Nurse Communication: Mobility status;Precautions;Weight bearing status         Time: 2585-2778 PT Time Calculation (min) (ACUTE ONLY): 66 min   Charges:   PT Evaluation $Initial PT Evaluation Tier I: 1 Procedure PT Treatments $Therapeutic Exercise: 8-22 mins $Therapeutic Activity: 8-22 mins $Self Care/Home Management: Jun 11, 2023   PT G CodesCandy Sledge A 08/22/2014, 1:05 PM Candy Sledge, [place], DPT [phone]

## 2014-08-22 NOTE — Progress Notes (Signed)
[place] for Heparin  Indication: atrial fibrillation  Allergies  Allergen Reactions  . Ciprofloxacin Nausea And Vomiting and Other (See Comments)    Possible tendinopathy   . Ezetimibe-Simvastatin Other (See Comments)     spasms  . Rosuvastatin Other (See Comments)    Leg spasms  . Atenolol Rash    Patient Measurements: 62 kg  Vital Signs: Temp: 99.5 F (37.5 C) (11/29 0501) Temp Source: Oral (11/29 0501) BP: 101/49 mmHg (11/29 0501) Pulse Rate: 57 (11/29 0501)  Labs:  Recent Labs  08/20/14 1934 08/21/14 0320 08/21/14 0925 08/22/14 0015 08/22/14 0400  HGB 12.1* 11.6*  --  11.1*  --   HCT 37.1* 35.2*  --  33.8*  --   PLT 200 179  --  163  --   LABPROT 19.9*  --   --  21.5*  --   INR 1.67*  --   --  1.85*  --   HEPARINUNFRC  --   --  0.17*  --  0.14*  CREATININE 2.03* 1.65*  --  1.45*  --     Estimated Creatinine Clearance: 28.9 mL/min (by C-G formula based on Cr of 1.45).   Medical History: Past Medical History  Diagnosis Date  . CAD (coronary artery disease)     a. LHC 4/06 with 60% pLAD, 40% ramus, 60% mRCA, and 90% mPDA. No intervention. Inferior akinesis on LV-gram was suggestive of possible inferior MI with recanalization.  . Hyperlipidemia     a. Myalgias with Lipitor and Crestor.  . Hypothyroidism   . Peripheral vascular disease   . Anxiety   . Hx of colonic polyps   . Paroxysmal atrial fibrillation 02/2009     a. With RVR tx with amiodarone returning to NSR 02/2009. b. Had atypical aflutter vs atrial tach 4/14, converted to NSR 4/14. In atypical flutter vs atrial tach in 1/15, DCCV 1/15 to NSR. c. Atrial tach 7/15 with junctional escape 60s.  . Systolic CHF     a.  Probable ischemic cardiomyopathy. Echo (6/10) with EF 40%, mildly dilated LV, moderate MR. ;  b.  echo 3/12: mild LVH, EF 30-35%, IL HK and Lat HK, mod to severe MR, trivial AI, mod LAE  . PNA (pneumonia)     History of   . Mitral regurgitation      a. Moderate by echo 6/10. b. 2-3+ by cath 2006; mod to severe by echo 3/12  . PVC's (premature ventricular contractions)   . Cataract   . Heart murmur   . CKD (chronic kidney disease), stage III   . Skin cancer     Hx of 2008 BCC  . Chronic indwelling Foley catheter     per family, bladder muscles no longer work  . Depression   . Myocardial infarction   . Basal cell carcinoma   . Paroxysmal atrial tachycardia     a. See details under PAF.  Marland Kitchen Paroxysmal atrial flutter     a. See details under PAF.  Marland Kitchen BPH (benign prostatic hyperplasia)   . Recurrent UTI   . Imbalance   . Carotid stenosis     a. Dopplers 2/15 - 40-59% BICA.     Assessment: Sub-therapeutic heparin level, warfarin has been re-started but INR is still <2. No issues per RN.   Goal of Therapy:  Heparin level 0.3-0.7 units/ml Monitor platelets by anticoagulation protocol: Yes   Plan:  -Increase heparin drip to 1200 units/hr -1330 HL -Daily CBC/HL -  Monitor for bleeding -DC heparin when INR >2  Narda Bonds 08/22/2014,5:37 AM

## 2014-08-22 NOTE — Progress Notes (Signed)
Pt stable No excessive leg swelling Requests abx with meals oob to chair today

## 2014-08-22 NOTE — Progress Notes (Signed)
Patient ID: [name], male   DOB: [birth year], 80 y.o.   MRN: [id number]  Cardiac is stable. He is holding sinus rhythm.  Daryel November, MD

## 2014-08-23 ENCOUNTER — Encounter (HOSPITAL_COMMUNITY): Payer: Self-pay | Admitting: Orthopaedic Surgery

## 2014-08-23 ENCOUNTER — Inpatient Hospital Stay (HOSPITAL_COMMUNITY): Payer: Medicare Other

## 2014-08-23 DIAGNOSIS — I2510 Atherosclerotic heart disease of native coronary artery without angina pectoris: Secondary | ICD-10-CM

## 2014-08-23 DIAGNOSIS — N183 Chronic kidney disease, stage 3 (moderate): Secondary | ICD-10-CM

## 2014-08-23 LAB — BASIC METABOLIC PANEL
Anion gap: 15 (ref 5–15)
BUN: 24 mg/dL — ABNORMAL HIGH (ref 6–23)
CO2: 21 mEq/L (ref 19–32)
Calcium: 7.7 mg/dL — ABNORMAL LOW (ref 8.4–10.5)
Chloride: 98 mEq/L (ref 96–112)
Creatinine, Ser: 1.34 mg/dL (ref 0.50–1.35)
GFR calc non Af Amer: 46 mL/min — ABNORMAL LOW (ref >90)
GFR, EST AFRICAN AMERICAN: 54 mL/min — AB (ref >90)
GLUCOSE: 94 mg/dL (ref 70–99)
POTASSIUM: 4.2 meq/L (ref 3.7–5.3)
SODIUM: 134 meq/L — AB (ref 137–147)

## 2014-08-23 LAB — URINALYSIS, ROUTINE W REFLEX MICROSCOPIC
Bilirubin Urine: NEGATIVE
Glucose, UA: NEGATIVE mg/dL
Ketones, ur: NEGATIVE mg/dL
Nitrite: NEGATIVE
Protein, ur: 30 mg/dL — AB
Specific Gravity, Urine: 1.02 (ref 1.005–1.030)
Urobilinogen, UA: 0.2 mg/dL (ref 0.0–1.0)
pH: 5 (ref 5.0–8.0)

## 2014-08-23 LAB — CBC
HEMATOCRIT: 27.8 % — AB (ref 39.0–52.0)
HEMOGLOBIN: 9.4 g/dL — AB (ref 13.0–17.0)
MCH: 30.8 pg (ref 26.0–34.0)
MCHC: 33.8 g/dL (ref 30.0–36.0)
MCV: 91.1 fL (ref 78.0–100.0)
Platelets: 130 10*3/uL — ABNORMAL LOW (ref 150–400)
RBC: 3.05 MIL/uL — ABNORMAL LOW (ref 4.22–5.81)
RDW: 15 % (ref 11.5–15.5)
WBC: 13.3 10*3/uL — AB (ref 4.0–10.5)

## 2014-08-23 LAB — HEPARIN LEVEL (UNFRACTIONATED): Heparin Unfractionated: 0.22 IU/mL — ABNORMAL LOW (ref 0.30–0.70)

## 2014-08-23 LAB — URINE MICROSCOPIC-ADD ON

## 2014-08-23 LAB — PROTIME-INR
INR: 2.35 — AB (ref 0.00–1.49)
PROTHROMBIN TIME: 25.9 s — AB (ref 11.6–15.2)

## 2014-08-23 MED ORDER — LISINOPRIL 2.5 MG PO TABS
2.5000 mg | ORAL_TABLET | Freq: Every day | ORAL | Status: DC
  Filled 2014-08-23 (×2): qty 1

## 2014-08-23 MED ORDER — FUROSEMIDE 40 MG PO TABS: 40.0000 mg | ORAL_TABLET | Freq: Every evening | ORAL | Status: DC

## 2014-08-23 MED ORDER — WHITE PETROLATUM GEL
Status: AC
  Administered 2014-08-23: 05:00:00
  Filled 2014-08-23: qty 5

## 2014-08-23 MED ORDER — TRAMADOL HCL 50 MG PO TABS
50.0000 mg | ORAL_TABLET | Freq: Two times a day (BID) | ORAL | Status: DC | PRN
  Administered 2014-08-24: 50 mg via ORAL
  Filled 2014-08-23: qty 1

## 2014-08-23 MED ORDER — WARFARIN 0.5 MG HALF TABLET
0.5000 mg | ORAL_TABLET | Freq: Once | ORAL | Status: AC
  Administered 2014-08-23: 0.5 mg via ORAL
  Filled 2014-08-23: qty 1

## 2014-08-23 MED ORDER — FUROSEMIDE 20 MG PO TABS
20.0000 mg | ORAL_TABLET | Freq: Every evening | ORAL | Status: DC
  Administered 2014-08-23 – 2014-08-24 (×2): 20 mg via ORAL
  Filled 2014-08-23 (×2): qty 1

## 2014-08-23 MED ORDER — FUROSEMIDE 40 MG PO TABS
40.0000 mg | ORAL_TABLET | Freq: Every day | ORAL | Status: DC
  Administered 2014-08-24: 40 mg via ORAL
  Filled 2014-08-23 (×3): qty 1

## 2014-08-23 MED ORDER — HYDROCODONE-ACETAMINOPHEN 5-325 MG PO TABS: 1.0000 | ORAL_TABLET | Freq: Four times a day (QID) | ORAL | Status: DC | PRN

## 2014-08-23 MED ORDER — PRAVASTATIN SODIUM 80 MG PO TABS
80.0000 mg | ORAL_TABLET | Freq: Every day | ORAL | Status: DC
  Administered 2014-08-23 – 2014-08-24 (×2): 80 mg via ORAL
  Filled 2014-08-23 (×2): qty 1

## 2014-08-23 NOTE — Clinical Social Work Psychosocial (Signed)
Clinical Social Work Department BRIEF PSYCHOSOCIAL ASSESSMENT 08/23/2014  Patient:  [name], [name]     Account Number:  [id number]     Admit date:  08/20/2014  Clinical Social Worker:  Delrae Sawyers  Date/Time:  08/23/2014 04:14 PM  Referred by:  Physician  Date Referred:  08/23/2014 Referred for  SNF Placement   Other Referral:   none.   Interview type:  Family Other interview type:   CSW spoke with pt's son-in-law, [place], regarding discharge disposition.    PSYCHOSOCIAL DATA Living Status:  WIFE Admitted from facility:   Level of care:   Primary support name:  [name] [phone]) Primary support relationship to patient:  CHILD, ADULT Degree of support available:   Strong support system.    CURRENT CONCERNS Current Concerns  Post-Acute Placement   Other Concerns:   none.    SOCIAL WORK ASSESSMENT / PLAN CSW received referral for possible SNF placement at time of discharge. CSW met with pt and pt's son-in-law at bedside regarding discharge disposition.    Pt's son-in-law informed CSW pt does not speak [place] (only [place]) and pt's son-in-law is interpretor for pt. Per pt's son-in-law, pt from home with pt's wife and was "very active" prior to hospital admission. Pt's son-in-law agreeable to PT recommendation for SNF placement at time of discharge. Pt's son-in-law expressed preference for [facility] or [facility] as both have PT that speak [place] (per pt's son-in-law).    CSW to continue to follow and assist with discharge planning needs.   Assessment/plan status:  Psychosocial Support/Ongoing Assessment of Needs Other assessment/ plan:   none.   Information/referral to community resources:   [facility] bed offers.    PATIENT'S/FAMILY'S RESPONSE TO PLAN OF CARE: Pt and pt's family understanding and agreeable to CSW plan of care. Pt and pt's family expressed no further questions or concerns at this time.       Lubertha Sayres, [place]  [phone]) Licensed Clinical Social Worker Orthopedics [phone]) and Surgical [phone])

## 2014-08-23 NOTE — Progress Notes (Signed)
Subjective: 2 Days Post-Op Procedure(s) (LRB): ARTHROPLASTY BIPOLAR HIP (Right) Patient reports pain as moderate (thru interpreter).  No acute changes overnight.  Asymptomatic acute blood loss anemia.  Very slow progress with therapy to be expected.  Will need SNF placement.  Objective: Vital signs in last 24 hours: Temp:  [98.3 F (36.8 C)-99.6 F (37.6 C)] 99 F (37.2 C) (11/30 0500) Pulse Rate:  [56-63] 62 (11/30 0500) Resp:  [16-18] 16 (11/30 0500) BP: (99-112)/(44-55) 106/47 mmHg (11/30 0500) SpO2:  [97 %-99 %] 99 % (11/30 0500)  Intake/Output from previous day: 11/29 0701 - 11/30 0700 In: 1385.8 [P.O.:1080; I.V.:305.8] Out: 1650 [Urine:1650] Intake/Output this shift:     Recent Labs  08/20/14 1934 08/21/14 0320 08/22/14 0015 08/23/14 0430  HGB 12.1* 11.6* 11.1* 9.4*    Recent Labs  08/22/14 0015 08/23/14 0430  WBC 10.8* 13.3*  RBC 3.63* 3.05*  HCT 33.8* 27.8*  PLT 163 130*    Recent Labs  08/22/14 0015 08/23/14 0430  NA 137 134*  K 4.2 4.2  CL 101 98  CO2 23 21  BUN 24* 24*  CREATININE 1.45* 1.34  GLUCOSE 122* 94  CALCIUM 7.8* 7.7*    Recent Labs  08/22/14 0015 08/23/14 0430  INR 1.85* 2.35*    Sensation intact distally Intact pulses distally Dorsiflexion/Plantar flexion intact Incision: scant drainage  Assessment/Plan: 2 Days Post-Op Procedure(s) (LRB): ARTHROPLASTY BIPOLAR HIP (Right) Up with therapy Discharge to SNF when medically stable (ok from ortho standpoint)  Chrishana Spargur Y 08/23/2014, 7:41 AM

## 2014-08-23 NOTE — Care Management Note (Signed)
CARE MANAGEMENT NOTE 08/23/2014  Patient:  [name]   Account Number:  [id number]  Date Initiated:  08/23/2014  Documentation initiated by:  Ricki Miller  Subjective/Objective Assessment:   80 yr old male admitted s/p fall  with a right hip fracture. Patient had a right hip hemiarthroplasty.     Action/Plan:   Physical therapy has recommended that patient go to SNF for shortterm rehab. Social worker notified. Patient will d/c with foley.   Anticipated DC Date:  08/24/2014   Anticipated DC Plan:  [place]  CM consult      PAC Choice  NA   Choice offered to / List presented to:     DME arranged  NA        [place] arranged  NA      Status of service:  Completed, signed off Medicare Important Message given?  YES (If response is "NO", the following Medicare IM given date fields will be blank) Date Medicare IM given:  08/23/2014 Medicare IM given by:  Ricki Miller Date Additional Medicare IM given:   Additional Medicare IM given by:    Discharge Disposition:  [place]  Per UR Regulation:  Reviewed for med. necessity/level of care/duration of stay

## 2014-08-23 NOTE — Progress Notes (Signed)
ANTICOAGULATION CONSULT NOTE - Follow Up Consult  Pharmacy Consult for Coumadin Indication: atrial fibrillation  Allergies  Allergen Reactions  . Ciprofloxacin Nausea And Vomiting and Other (See Comments)    Possible tendinopathy   . Ezetimibe-Simvastatin Other (See Comments)     spasms  . Rosuvastatin Other (See Comments)    Leg spasms  . Atenolol Rash    Patient Measurements: Height: 5' 2.5" (158.8 cm) Weight: 137 lb 4.8 oz (62.279 kg) IBW/kg (Calculated) : 55.75 Heparin Dosing Weight:   Vital Signs: Temp: 100.5 F (38.1 C) (11/30 1300) Temp Source: Oral (11/30 0500) BP: 100/35 mmHg (11/30 1300) Pulse Rate: 62 (11/30 1300)  Labs:  Recent Labs  08/20/14 1934 08/21/14 0320  08/22/14 0015  08/22/14 1510 08/22/14 2107 08/23/14 0430  HGB 12.1* 11.6*  --  11.1*  --   --   --  9.4*  HCT 37.1* 35.2*  --  33.8*  --   --   --  27.8*  PLT 200 179  --  163  --   --   --  130*  LABPROT 19.9*  --   --  21.5*  --   --   --  25.9*  INR 1.67*  --   --  1.85*  --   --   --  2.35*  HEPARINUNFRC  --   --   < >  --   < > 0.54 0.49 0.22*  CREATININE 2.03* 1.65*  --  1.45*  --   --   --  1.34  < > = values in this interval not displayed.  Estimated Creatinine Clearance: 31.2 mL/min (by C-G formula based on Cr of 1.34).   Medications:  Scheduled:  . amiodarone  200 mg Oral BID  . digoxin  0.0625 mg Oral Q48H  . docusate sodium  100 mg Oral BID  . furosemide  20 mg Oral QPM  . furosemide  40 mg Oral QPC breakfast  . levothyroxine  100 mcg Oral QAC breakfast  . lisinopril  2.5 mg Oral Daily  . metoprolol succinate  12.5 mg Oral BID  . MILK THISTLE XTRA  1 capsule Oral TID  . potassium chloride  20 mEq Oral Daily  . pravastatin  80 mg Oral Daily  . tamsulosin  0.4 mg Oral QHS  . Warfarin - Pharmacist Dosing Inpatient   Does not apply q1800  . zolpidem  5 mg Oral QHS    Assessment: 80yo male with AFib, on Heparin bridge to Coumadin.  INR therapeutic this AM at 2.35-  trending up quickly.   Heparin has been stopped.  Pt is on Amiodarone, though this is not a new drug for him.  Both Hg and pltc have been drifting down.  Cr improved to 1.34.  No bleeding noted.    Goal of Therapy:  INR 2-3 Monitor platelets by anticoagulation protocol: Yes   Plan:  Coumadin 0.5mg  x 1 D/C heparin labs Watch for s/s of bleeding  Gracy Bruins, PharmD [facility]

## 2014-08-23 NOTE — Progress Notes (Signed)
Family Medicine Teaching Service Daily Progress Note Intern Pager: [phone]  Patient name: [name] Medical record number: [id number] Date of birth: 11-06-1925 Age: 80 y.o. Gender: male  Primary Care Provider: [name], MD Consultants: ortho, cards Code Status: full  Assessment and Plan: Sunny Gains is a 80 y.o. male presenting with femur fracture . PMH is significant for HTN, HLD, hypothyroidism, BPH, CAD with mitral insufficiency, chronic systolic CHF, peripheral vascular disease, paroxysmal atrial flutter/fib, and CKD stage III.   Right femoral fracture - related to fall after stepping off curb - Orthopedics consulted, s/p arthroplasty (11/28) - On chronic warfarin for afib, held for surgery, now back on with heparin bridge - D/c heparin when INR >2 - PT recommending SNF placement  Recent UTI. Patient has chronic indwelling catheter.  - Started on Keflex for UTI on 11/18, his urine culture grew pansensitive Escherichia coli and Klebsiella, changed to Augmentin on 11/26 through 11/29 - Repeat urine culture negative  Hematuria. Possibly related to chronic indwelling foley catheter. No recent trauma or catheter changes. INR has not been severely elevated. - Improving - Continue to monitor - Consider further evaluation if no improvement.   Acute kidney injury on Chronic kidney disease stage III. Cr 2.03 on admission, baseline 1.4-1.7. Likely prerenal etiology.  - Resolved, Cr now back to baseline.   CAD, mitral insufficiency, chronic systolic CHF - No chest pain at this time, appears to be at cardiac baseline - Echo from 02/2014 with EF of 40% and moderate mitral regurg - Continue metoprolol, amiodarone, digoxin, lisinopril - Continue home pravastatin  HTN - Blood pressure stable - Continue metoprolol, lisinopril  A Fib/flutter - Rate controlled and appears to be in normal sinus rhythm currently - Continue metoprolol, amiodarone, digoxin  FEN/GI: KVO,  regular diet Prophylaxis: Full dose pharmacologic prophylaxis as above for A. fib  Disposition: SNF bed when available.   Subjective:  NAEON. Doing well. Pain has improved. Wants to get up and work with PT.   Objective: Temp:  [98.3 F (36.8 C)-99.6 F (37.6 C)] 99 F (37.2 C) (11/30 0500) Pulse Rate:  [56-63] 62 (11/30 0500) Resp:  [16-18] 16 (11/30 0500) BP: (99-112)/(44-55) 106/47 mmHg (11/30 0500) SpO2:  [97 %-99 %] 99 % (11/30 0500) Physical Exam: General: NAD, resting comfortably in bed Cardiovascular: bradycardic, regular rhythm Respiratory: CTAB, no rales or wheezes Abdomen: s, NT, ND Extremities: no edema MSK: Incision clean dry and intact, DP and PT pulses palpable bilaterally with good capillary refill  Laboratory:  Recent Labs Lab 08/21/14 0320 08/22/14 0015 08/23/14 0430  WBC 12.7* 10.8* 13.3*  HGB 11.6* 11.1* 9.4*  HCT 35.2* 33.8* 27.8*  PLT 179 163 130*    Recent Labs Lab 08/21/14 0320 08/22/14 0015 08/23/14 0430  NA 137 137 134*  K 4.1 4.2 4.2  CL 101 101 98  CO2 22 23 21   BUN 31* 24* 24*  CREATININE 1.65* 1.45* 1.34  CALCIUM 8.3* 7.8* 7.7*  PROT 6.9  --   --   BILITOT 0.7  --   --   ALKPHOS 57  --   --   ALT 37  --   --   AST 50*  --   --   GLUCOSE 102* 122* 94    Pelvis Portable  08/21/2014   CLINICAL DATA:  Postop right hip arthroplasty  EXAM: PORTABLE PELVIS 1-2 VIEWS  COMPARISON:  08/20/2014  FINDINGS: Single portable view of the pelvis submitted. There is right hip prosthesis in anatomic alignment. Lateral skin  staples are noted.  IMPRESSION: Right hip prosthesis in anatomic alignment.   Electronically Signed   By: Lahoma Crocker M.D.   On: 08/21/2014 16:25     Dimas Chyle, MD 08/23/2014, 8:34 AM PGY-1, [place] Intern pager: [phone], text pages welcome

## 2014-08-23 NOTE — Clinical Social Work Placement (Addendum)
Clinical Social Work Department CLINICAL SOCIAL WORK PLACEMENT NOTE 08/23/2014  Patient:  [name], [name]  Account Number:  [id number] Admit date:  08/20/2014  Clinical Social Worker:  Delrae Sawyers  Date/time:  08/23/2014 04:19 PM  Clinical Social Work is seeking post-discharge placement for this patient at the following level of care:   [place]   (*CSW will update this form in Epic as items are completed)   08/23/2014  Patient/family provided with [facility] Department of Clinical Social Work's list of facilities offering this level of care within the geographic area requested by the patient (or if unable, by the patient's family).  08/23/2014  Patient/family informed of their freedom to choose among providers that offer the needed level of care, that participate in Medicare, Medicaid or managed care program needed by the patient, have an available bed and are willing to accept the patient.  08/23/2014  Patient/family informed of [facility]' ownership interest in [facility], as well as of the fact that they are under no obligation to receive care at this facility.  PASARR submitted to EDS on 08/23/2014 PASARR number received on 08/23/2014  FL2 transmitted to all facilities in geographic area requested by pt/family on  08/23/2014 FL2 transmitted to all facilities within larger geographic area on   Patient informed that his/her managed care company has contracts with or will negotiate with  certain facilities, including the following:     Patient/family informed of bed offers received:   Patient chooses bed at : [facility]  Physician recommends and patient chooses bed at    Patient to be transferred to [facility] on 08/24/2014 Patient to be transferred to facility by Ambulance Patient and family notified of transfer on 08/24/2014 Name of family member notified:  Paulino Door  The following physician request were entered in Epic:  Additional  Comments:  08/24/2014 - Gwenevere Abbot CSW-Intern Reviewed and Approved By: Crawford Deaisha Welborn, LCSW    Lubertha Sayres, LCSWA [phone]) Licensed Clinical Social Worker Orthopedics [phone]) and Surgical [phone])

## 2014-08-23 NOTE — Progress Notes (Signed)
OT Cancellation Note  Patient Details Name: [name] MRN: [id number] DOB: 09-Sep-1926   Cancelled Treatment:    Reason Eval/Treat Not Completed: Other (comment) Pt is Medicare and current D/C plan is SNF. No apparent immediate acute care OT needs, therefore will defer OT to SNF. If OT eval is needed please call Acute Rehab Dept. at [phone] or text page OT at [phone].    Almon Register [phone] 08/23/2014, 7:27 AM

## 2014-08-23 NOTE — Progress Notes (Signed)
Physical Therapy Treatment Patient Details Name: [name] MRN: [id number] DOB: 05/06/1926 Today's Date: 08/23/2014    History of Present Illness Patient is a 80 y/o male s/p Right hip hemiarthroplasty secondary to mechanical fall. WBAT RLE. PMH of CAD, PVD, A-fib, CHF, mitral regurgitation, CKD, indwelling foley catheter and MI.     PT Comments    Making slow, but solid progress today with incr activity tolerance, incr standing tolerance enough to initiate gait training; With today's progress, and the fact that he was very active PTA, it is worth considering CIR for post-acute rehab -- still, pt and family may chose a certain SNF for rehab as there is a therapist who speaks [place] there;    Follow Up Recommendations  Supervision/Assistance - 24 hour; Worth considering CIR for post-acute rehab     Equipment Recommendations  Rolling walker with 5" wheels;3in1 (PT)    Recommendations for Other Services       Precautions / Restrictions Precautions Precautions: Fall;Anterior Hip Precaution Comments: Reviewed precautions Restrictions RLE Weight Bearing: Weight bearing as tolerated    Mobility  Bed Mobility                  Transfers Overall transfer level: Needs assistance Equipment used: Rolling walker (2 wheeled);None Transfers: Sit to/from Stand Sit to Stand: Max assist;Mod assist;+2 physical assistance         General transfer comment: Multiple reps of sit to stand with improvements noted with practice; Requires heavy L knee blocking initially for stability, able to back off of support; much more difficulty when attempted with RW as it was difficult to keep non-operative LLE stable, and pt sat back to chair unexpectedly  Ambulation/Gait Ambulation/Gait assistance: +2 physical assistance;Max assist (and chair behind) Ambulation Distance (Feet): 2 Feet (x2) Assistive device: 2 person hand held assist Gait Pattern/deviations: Step-to pattern;Decreased step  length - right;Decreased stance time - left;Trunk flexed     General Gait Details: Verbal and tactile cues to weight shift onto stance leg (L and R), with close guard and occasional need for knee blocking (especially L) for stability   Stairs            Wheelchair Mobility    Modified Rankin (Stroke Patients Only)       Balance             Standing balance-Leahy Scale: Zero                      Cognition Arousal/Alertness: Awake/alert Behavior During Therapy: WFL for tasks assessed/performed Overall Cognitive Status: Within Functional Limits for tasks assessed                      Exercises      General Comments        Pertinent Vitals/Pain Pain Assessment: 0-10 Pain Score: 8  Pain Location: R hip, with movement; much les pain without motion Pain Descriptors / Indicators: Aching;Grimacing Pain Intervention(s): Limited activity within patient's tolerance;Monitored during session;Repositioned;Premedicated before session    Home Living                      Prior Function            PT Goals (current goals can now be found in the care plan section) Acute Rehab PT Goals Patient Stated Goal: would like to walk PT Goal Formulation: With patient/family Time For Goal Achievement: 09/05/14 Potential to Achieve Goals: Fair Progress towards PT  goals: Progressing toward goals    Frequency  Min 4X/week    PT Plan Current plan remains appropriate    Co-evaluation             End of Session Equipment Utilized During Treatment: Gait belt Activity Tolerance: Patient limited by pain Patient left: in chair;with call bell/phone within reach;with family/visitor present     Time: 1352-1449 PT Time Calculation (min) (ACUTE ONLY): 57 min  Charges:  $Gait Training: 23-37 mins $Therapeutic Activity: 23-37 mins                    G Codes:      Quin Hoop 08/23/2014, 4:31 PM  Roney Marion, [place] Pager [phone] Office [phone]

## 2014-08-23 NOTE — Care Management Note (Signed)
2nd IM letter given. Keziah Avis, RN BSN Case Manager 

## 2014-08-23 NOTE — Progress Notes (Signed)
Patient ID: [name], male   DOB: 05/03/1926, 80 y.o.   MRN: [id number]   SUBJECTIVE: Some pain in right hip.  Needs a lot of help to get out of bed.  No dyspnea.  Remains in NSR.  Low grade fever to 99.    Scheduled Meds: . amiodarone  200 mg Oral BID  .  ceFAZolin (ANCEF) IV  2 g Intravenous Once  . digoxin  0.0625 mg Oral Q48H  . docusate sodium  100 mg Oral BID  . furosemide  20 mg Oral QPM  . furosemide  40 mg Oral QPC breakfast  . Glycerin (Adult)  1 suppository Rectal Once  . levothyroxine  100 mcg Oral QAC breakfast  . lisinopril  2.5 mg Oral Daily  . metoprolol succinate  12.5 mg Oral BID  . MILK THISTLE XTRA  1 capsule Oral TID  . potassium chloride  20 mEq Oral Daily  . pravastatin  80 mg Oral Daily  . tamsulosin  0.4 mg Oral QHS  . Warfarin - Pharmacist Dosing Inpatient   Does not apply q1800  . zolpidem  5 mg Oral QHS   Continuous Infusions: . sodium chloride 10 mL/hr at 08/21/14 1614   PRN Meds:.acetaminophen **OR** acetaminophen, HYDROcodone-acetaminophen, menthol-cetylpyridinium **OR** phenol, methocarbamol **OR** methocarbamol (ROBAXIN)  IV, metoCLOPramide **OR** metoCLOPramide (REGLAN) injection, morphine injection, ondansetron **OR** ondansetron (ZOFRAN) IV, traMADol    Filed Vitals:   08/22/14 1820 08/22/14 1844 08/22/14 1937 08/23/14 0500  BP:  112/46 110/46 106/47  Pulse:  63 61 62  Temp: 99.6 F (37.6 C)  98.8 F (37.1 C) 99 F (37.2 C)  TempSrc: Oral  Oral Oral  Resp:  16 16 16   Height:      Weight:      SpO2:  99% 98% 99%    Intake/Output Summary (Last 24 hours) at 08/23/14 0820 Last data filed at 08/23/14 0501  Gross per 24 hour  Intake 1145.83 ml  Output   1650 ml  Net -504.17 ml    LABS: Basic Metabolic Panel:  Recent Labs  08/22/14 0015 08/23/14 0430  NA 137 134*  K 4.2 4.2  CL 101 98  CO2 23 21  GLUCOSE 122* 94  BUN 24* 24*  CREATININE 1.45* 1.34  CALCIUM 7.8* 7.7*   Liver Function Tests:  Recent Labs   08/21/14 0320  AST 50*  ALT 37  ALKPHOS 57  BILITOT 0.7  PROT 6.9  ALBUMIN 3.0*   No results for input(s): LIPASE, AMYLASE in the last 72 hours. CBC:  Recent Labs  08/20/14 1934  08/22/14 0015 08/23/14 0430  WBC 9.7  < > 10.8* 13.3*  NEUTROABS 6.4  --   --   --   HGB 12.1*  < > 11.1* 9.4*  HCT 37.1*  < > 33.8* 27.8*  MCV 92.3  < > 93.1 91.1  PLT 200  < > 163 130*  < > = values in this interval not displayed. Cardiac Enzymes: No results for input(s): CKTOTAL, CKMB, CKMBINDEX, TROPONINI in the last 72 hours. BNP: Invalid input(s): POCBNP D-Dimer: No results for input(s): DDIMER in the last 72 hours. Hemoglobin A1C: No results for input(s): HGBA1C in the last 72 hours. Fasting Lipid Panel: No results for input(s): CHOL, HDL, LDLCALC, TRIG, CHOLHDL, LDLDIRECT in the last 72 hours. Thyroid Function Tests: No results for input(s): TSH, T4TOTAL, T3FREE, THYROIDAB in the last 72 hours.  Invalid input(s): FREET3 Anemia Panel: No results for input(s): VITAMINB12, FOLATE, FERRITIN, TIBC, IRON, RETICCTPCT  in the last 72 hours.  RADIOLOGY: Dg Pelvis 1-2 Views  08/20/2014   CLINICAL DATA:  Fall  EXAM: PELVIS - 1-2 VIEW  COMPARISON:  None.  FINDINGS: Right femoral neck fracture with mild displacement. Hip joint appears normal bilaterally. No other fracture identified.  IMPRESSION: Right femoral neck fracture.   Electronically Signed   By: Franchot Gallo M.D.   On: 08/20/2014 20:20   Dg Wrist Complete Right  08/20/2014   CLINICAL DATA:  Acute right wrist pain after fall.  EXAM: RIGHT WRIST - COMPLETE 3+ VIEW  COMPARISON:  None.  FINDINGS: There is no evidence of fracture or dislocation. Narrowing and sclerosis is seen involving the joint space between the scaphoid and trapezium, as well as the first carpometacarpal joint. Soft tissues are unremarkable.  IMPRESSION: Findings consistent with osteoarthritis as described above. No acute abnormality seen in the right wrist.    Electronically Signed   By: Sabino Dick M.D.   On: 08/20/2014 20:23   Dg Femur Right  08/20/2014   CLINICAL DATA:  Fall with right hip pain.  EXAM: RIGHT FEMUR - 2 VIEW  COMPARISON:  None.  FINDINGS: There is a fracture of the proximal right femur. Findings are consistent with a subcapital fracture. Mild elevation of the distal fragment. The right femoral head is located. No acute bone abnormality in the mid and distal femur.  IMPRESSION: Subcapital right femoral fracture.   Electronically Signed   By: Markus Daft M.D.   On: 08/20/2014 20:22   Ct Head Wo Contrast  08/20/2014   CLINICAL DATA:  Fall  EXAM: CT HEAD WITHOUT CONTRAST  TECHNIQUE: Contiguous axial images were obtained from the base of the skull through the vertex without intravenous contrast.  COMPARISON:  None.  FINDINGS: Moderate atrophy. Chronic microvascular ischemic changes in the white matter. Chronic infarct right cerebellum.  Negative for acute infarct. Negative for hemorrhage or mass. No shift of the midline structures. Negative for fracture.  IMPRESSION: Atrophy and chronic ischemia.  No acute abnormality.   Electronically Signed   By: Franchot Gallo M.D.   On: 08/20/2014 20:37   Pelvis Portable  08/21/2014   CLINICAL DATA:  Postop right hip arthroplasty  EXAM: PORTABLE PELVIS 1-2 VIEWS  COMPARISON:  08/20/2014  FINDINGS: Single portable view of the pelvis submitted. There is right hip prosthesis in anatomic alignment. Lateral skin staples are noted.  IMPRESSION: Right hip prosthesis in anatomic alignment.   Electronically Signed   By: Lahoma Crocker M.D.   On: 08/21/2014 16:25    PHYSICAL EXAM General: NAD Neck: No JVD, no thyromegaly or thyroid nodule.  Lungs: Clear to auscultation bilaterally with normal respiratory effort. CV: Nondisplaced PMI.  Heart regular S1/S2, no S3/S4, 2/6 HSM apex.  No peripheral edema.  No carotid bruit.  Normal pedal pulses.  Abdomen: Soft, nontender, no hepatosplenomegaly, no distention.   Neurologic: Alert and oriented x 3.  Psych: Normal affect. Extremities: No clubbing or cyanosis.   TELEMETRY: Reviewed telemetry pt in NSR  ASSESSMENT AND PLAN: 80 yo with history of CAD, ischemic cardiomyopathy (EF 30-35% on last echo with moderate-severe MR), paroxysmal atrial fibrillation and atrial tachycardia presented with mechanical fall and right hip fracture, now s/p surgery.  1. Right hip fracture: s/p surgery.  Will need rehab placement. Continue PT work.  2. Atrial arrhythmias: Maintaining NSR, continue home amiodarone dosing. Continue coumadin, can stop heparin gtt with INR > 2.  3. Chronic systolic CHF: EF 16-10% on 2012 echo with moderate-severe  MR.  Volume looks stable. Continue home dosing of digoxin, lisinopril, Lasix, and Toprol XL.  Will check digoxin level in am.   4. CKD: Creatinine stable.  5. CAD: No chest pain.  Restart home statin.  He is on warfarin so not on ASA.   6. ID: Low grade fever, mildly elevated WBCs.  May be post-op.  He has had frequent UTIs.  Will resend UA/culture (negative from earlier in stay).  Will get CXR.   Loralie Champagne 08/23/2014 8:24 AM

## 2014-08-24 ENCOUNTER — Encounter (HOSPITAL_COMMUNITY): Payer: Medicare Other

## 2014-08-24 DIAGNOSIS — W19XXXS Unspecified fall, sequela: Secondary | ICD-10-CM

## 2014-08-24 DIAGNOSIS — S72001S Fracture of unspecified part of neck of right femur, sequela: Secondary | ICD-10-CM

## 2014-08-24 LAB — PROTIME-INR
INR: 1.98 — ABNORMAL HIGH (ref 0.00–1.49)
Prothrombin Time: 22.7 seconds — ABNORMAL HIGH (ref 11.6–15.2)

## 2014-08-24 LAB — CBC
HCT: 28.6 % — ABNORMAL LOW (ref 39.0–52.0)
HEMOGLOBIN: 9.7 g/dL — AB (ref 13.0–17.0)
MCH: 30.8 pg (ref 26.0–34.0)
MCHC: 33.9 g/dL (ref 30.0–36.0)
MCV: 90.8 fL (ref 78.0–100.0)
Platelets: 160 10*3/uL (ref 150–400)
RBC: 3.15 MIL/uL — ABNORMAL LOW (ref 4.22–5.81)
RDW: 15 % (ref 11.5–15.5)
WBC: 13.6 10*3/uL — ABNORMAL HIGH (ref 4.0–10.5)

## 2014-08-24 LAB — BASIC METABOLIC PANEL
Anion gap: 12 (ref 5–15)
BUN: 26 mg/dL — AB (ref 6–23)
CHLORIDE: 98 meq/L (ref 96–112)
CO2: 26 mEq/L (ref 19–32)
Calcium: 8 mg/dL — ABNORMAL LOW (ref 8.4–10.5)
Creatinine, Ser: 1.37 mg/dL — ABNORMAL HIGH (ref 0.50–1.35)
GFR calc non Af Amer: 45 mL/min — ABNORMAL LOW (ref >90)
GFR, EST AFRICAN AMERICAN: 52 mL/min — AB (ref >90)
Glucose, Bld: 107 mg/dL — ABNORMAL HIGH (ref 70–99)
Potassium: 3.9 mEq/L (ref 3.7–5.3)
Sodium: 136 mEq/L — ABNORMAL LOW (ref 137–147)

## 2014-08-24 LAB — URINE CULTURE
CULTURE: NO GROWTH
Colony Count: NO GROWTH

## 2014-08-24 LAB — DIGOXIN LEVEL: Digoxin Level: 0.5 ng/mL — ABNORMAL LOW (ref 0.8–2.0)

## 2014-08-24 LAB — POCT INR: INR: 1.8 — AB (ref <=1.1)

## 2014-08-24 MED ORDER — PRAVASTATIN SODIUM 80 MG PO TABS: 80.0000 mg | ORAL_TABLET | Freq: Every day | ORAL | Status: DC

## 2014-08-24 MED ORDER — DSS 100 MG PO CAPS: 100.0000 mg | ORAL_CAPSULE | Freq: Two times a day (BID) | ORAL | Status: DC

## 2014-08-24 MED ORDER — WARFARIN 0.5 MG HALF TABLET
1.5000 mg | ORAL_TABLET | Freq: Once | ORAL | Status: AC
  Administered 2014-08-24: 1.5 mg via ORAL
  Filled 2014-08-24: qty 1

## 2014-08-24 NOTE — Progress Notes (Signed)
Physical Therapy Treatment Patient Details Name: [name] MRN: [id number] DOB: Jul 21, 1926 Today's Date: 08/24/2014    History of Present Illness Patient is a 80 y/o male s/p Right hip hemiarthroplasty secondary to mechanical fall. WBAT RLE. PMH of CAD, PVD, A-fib, CHF, mitral regurgitation, CKD, indwelling foley catheter and MI.     PT Comments    Improving activity tolerance, and abl e to make continuing progress with taking steps; anticipate slow, but steady progress in post-acute rehab  Follow Up Recommendations  SNF;Supervision/Assistance - 24 hour     Equipment Recommendations  Rolling walker with 5" wheels;3in1 (PT)    Recommendations for Other Services       Precautions / Restrictions Precautions Precautions: Fall;Anterior Hip Precaution Comments: Reviewed precautions Restrictions Weight Bearing Restrictions: Yes RLE Weight Bearing: Weight bearing as tolerated    Mobility  Bed Mobility Overal bed mobility: Needs Assistance;+2 for physical assistance Bed Mobility: Supine to Sit     Supine to sit: +2 for physical assistance;Max assist     General bed mobility comments: VC's for sequencing, hand placement and technique. Increased time and effort. Use of chuck pad to assist with scooting bottom to EOB.   Transfers Overall transfer level: Needs assistance Equipment used: Rolling walker (2 wheeled);None Transfers: Sit to/from Stand Sit to Stand: Mod assist         General transfer comment: Continuing practice with multiple reps and noted improvement in initiation and no huge need to block knees for successful standing  Ambulation/Gait Ambulation/Gait assistance: +2 physical assistance;Mod assist (and chair behind) Ambulation Distance (Feet): 8 Feet (x3) Assistive device: Rolling walker (2 wheeled) Gait Pattern/deviations: Step-to pattern;Decreased stance time - right;Decreased step length - left;Trunk flexed     General Gait Details: Continued cues  for gait sequence and posture; much improved stance stability today with no need to block knees for stability and better use of RW   Stairs            Wheelchair Mobility    Modified Rankin (Stroke Patients Only)       Balance             Standing balance-Leahy Scale: Poor                      Cognition Arousal/Alertness: Awake/alert Behavior During Therapy: WFL for tasks assessed/performed Overall Cognitive Status: Within Functional Limits for tasks assessed                      Exercises Total Joint Exercises Quad Sets: AROM;Right;10 reps Gluteal Sets: AROM;Both;10 reps Towel Squeeze: AROM;Right;10 reps Heel Slides: AAROM;Right;10 reps    General Comments        Pertinent Vitals/Pain Pain Assessment: 0-10 Pain Score: 5  Pain Location: R hip with movement and WB Pain Descriptors / Indicators: Aching;Sore Pain Intervention(s): Monitored during session;Repositioned    Home Living                      Prior Function            PT Goals (current goals can now be found in the care plan section) Acute Rehab PT Goals Patient Stated Goal: would like to walk PT Goal Formulation: With patient/family Time For Goal Achievement: 09/05/14 Potential to Achieve Goals: Good Progress towards PT goals: Progressing toward goals    Frequency  Min 4X/week    PT Plan Current plan remains appropriate    Co-evaluation  End of Session Equipment Utilized During Treatment: Gait belt Activity Tolerance: Patient tolerated treatment well;Patient limited by pain Patient left: in chair;with call bell/phone within reach;with family/visitor present     Time: 0156-1537 PT Time Calculation (min) (ACUTE ONLY): 44 min  Charges:  $Gait Training: 8-22 mins $Therapeutic Exercise: 8-22 mins $Therapeutic Activity: 8-22 mins                    G Codes:      Roney Marion Hamff 08/24/2014, 1:50 PM  Roney Marion, [place] Pager [phone] Office [phone]

## 2014-08-24 NOTE — Progress Notes (Signed)
ANTICOAGULATION CONSULT NOTE - Follow Up Consult  Pharmacy Consult for Coumadin Indication: atrial fibrillation  Allergies  Allergen Reactions  . Ciprofloxacin Nausea And Vomiting and Other (See Comments)    Possible tendinopathy   . Ezetimibe-Simvastatin Other (See Comments)     spasms  . Rosuvastatin Other (See Comments)    Leg spasms  . Atenolol Rash    Patient Measurements: Height: 5' 2.5" (158.8 cm) Weight: 137 lb 4.8 oz (62.279 kg) IBW/kg (Calculated) : 55.75 Heparin Dosing Weight:   Vital Signs: Temp: 98.4 F (36.9 C) (12/01 1411) Temp Source: Oral (12/01 0553) BP: 115/49 mmHg (12/01 1411) Pulse Rate: 60 (12/01 1411)  Labs:  Recent Labs  08/22/14 0015  08/22/14 1510 08/22/14 2107 08/23/14 0430 08/24/14 0512  HGB 11.1*  --   --   --  9.4* 9.7*  HCT 33.8*  --   --   --  27.8* 28.6*  PLT 163  --   --   --  130* 160  LABPROT 21.5*  --   --   --  25.9* 22.7*  INR 1.85*  --   --   --  2.35* 1.98*  HEPARINUNFRC  --   < > 0.54 0.49 0.22*  --   CREATININE 1.45*  --   --   --  1.34 1.37*  < > = values in this interval not displayed.  Estimated Creatinine Clearance: 30.5 mL/min (by C-G formula based on Cr of 1.37).   Medications:  Scheduled:  . amiodarone  200 mg Oral BID  . digoxin  0.0625 mg Oral Q48H  . docusate sodium  100 mg Oral BID  . furosemide  20 mg Oral QPM  . furosemide  40 mg Oral QPC breakfast  . levothyroxine  100 mcg Oral QAC breakfast  . lisinopril  2.5 mg Oral Daily  . metoprolol succinate  12.5 mg Oral BID  . MILK THISTLE XTRA  1 capsule Oral TID  . potassium chloride  20 mEq Oral Daily  . pravastatin  80 mg Oral Daily  . tamsulosin  0.4 mg Oral QHS  . Warfarin - Pharmacist Dosing Inpatient   Does not apply q1800  . zolpidem  5 mg Oral QHS    Assessment: 80yo male with AFib, continuing on Coumadin.  INR 1.98 today, down a bit.  No bleeding problems noted.  Goal of Therapy:  INR 2-3 Monitor platelets by anticoagulation protocol:  Yes   Plan:  Coumadin 1.5mg  today  Gracy Bruins, PharmD Clinical Pharmacist [facility]

## 2014-08-24 NOTE — Discharge Instructions (Signed)
Increase activities as comfort allows. Only ever up with assistance. Can put full weight as tolerated on right hip. Anterior hip precautions. Can leave current dressing in place and alone until his outpatient orthopedic follow-up.   ??????? ????? ????? (Hip Fracture) ??????? ????? ????? - ??? ??????? ??????? ????? ????????? ????? (?????).  ??????? ??????? ????? ????? ?????????? ? ?????????? ??????? ????? ? ??????? ?????. ???, ??? ???????, ???????? ??????????? ???????, ?? ????? ????????? ? ? ?????? ???????, ????????, ??? ????????????? ??????. ??????? ????? ?????????? ???? ????????? ????? ????? ?????????? ? ????????? ????????? ?????:  ??? ???????????? ?????? (???????) ? ??? ??????????, ??????? ???????????? ????????? ??????????, ???????? ??? ??????? ?????????? ??? ????????;  ???? ??????? ????? ??????????? ??? ????????????;  ? ?????? ????, ??????? ???????????????? ?? ????? ???;  ??? ??????? ????????? ???????? ?????? ???????. ????????  ???????? ???????? ????? ?????:  ???? ? ??????? ?????? ?????.  ????????????? ????????? ??? ?? ???????????? ???? (????, ????????? ???????? ????? ?????? ????? ???????? ????? ?????).  ???? ??????? ??????? [?? ?????], ?????? ? ?????? ???????????? ???? ????????? ??????. ??????? ??????????? ?????? ????????? ???????????????? ??????????, ???? ?? ????? ??????? ????? ?????. ????? ??????????? ??????? ???????? ? ??????? ?????? ??????, ????????? ?????????? ??????????????. ??? ???????????? ????? ???????? ?????????? ??? ???????? ????? ?????. ???????, ???? ??????? ?? ????? ?? ????????????? ???????????, ????????? ???????? ??- ??? ???-????????????. ???????  ??????? ????????, ??? ???????, ?????????????. ??? ????????, ??? ??? ?????????? ??????? ???????? ?????????? ??????????? ?????, ?????? ??? ????????.  ?????????? ?? ????? ? ???????? ???????? ?????????? ????????????? ????????? ? ???????????? ? ?????????? ?????. ?????????? ? ?????, ????: ???? ???????????? ???? ????? ??????  ??????????????? ?????????. ?????????, ??? ??:  ????????? ?????? ??????????.  ?????? ?????????????? ??????? ?? ????? ??????????.  ??????????????? ?????????? ? ?????, ???? ??? ?? ?????????? ????? ??? ?????????? ????. Document Released: 09/10/2005 Document Revised: 09/15/2013 [facility] Patient Information 2015 [place]. This information is not intended to replace advice given to you by your health care provider. Make sure you discuss any questions you have with your health care provider.  Information on my medicine - Coumadin   (Warfarin)  This medication education was reviewed with me or my healthcare representative as part of my discharge preparation.  The pharmacist that spoke with me during my hospital stay was:  Jaquita Folds, [facility].  Why was Coumadin prescribed for you? Coumadin was prescribed for you because you have a blood clot or a medical condition that can cause an increased risk of forming blood clots. Blood clots can cause serious health problems by blocking the flow of blood to the heart, lung, or brain. Coumadin can prevent harmful blood clots from forming. As a reminder your indication for Coumadin is:   Stroke Prevention Because Of Atrial Fibrillation  What test will check on my response to Coumadin? While on Coumadin (warfarin) you will need to have an INR test regularly to ensure that your dose is keeping you in the desired range. The INR (international normalized ratio) number is calculated from the result of the laboratory test called prothrombin time (PT).  If an INR APPOINTMENT HAS NOT ALREADY BEEN MADE FOR YOU please schedule an appointment to have this lab work done by your health care provider within 7 days. Your INR goal is usually a number between:  2 to 3 or your provider may give you a more narrow range like 2-2.5.  Ask your health care provider during an office visit what your goal INR is.  What  do you need to  know  About  COUMADIN? Take Coumadin  (warfarin) exactly as prescribed  by your healthcare provider about the same time each day.  DO NOT stop taking without talking to the doctor who prescribed the medication.  Stopping without other blood clot prevention medication to take the place of Coumadin may increase your risk of developing a new clot or stroke.  Get refills before you run out.  What do you do if you miss a dose? If you miss a dose, take it as soon as you remember on the same day then continue your regularly scheduled regimen the next day.  Do not take two doses of Coumadin at the same time.  Important Safety Information A possible side effect of Coumadin (Warfarin) is an increased risk of bleeding. You should call your healthcare provider right away if you experience any of the following: ? Bleeding from an injury or your nose that does not stop. ? Unusual colored urine (red or dark brown) or unusual colored stools (red or black). ? Unusual bruising for unknown reasons. ? A serious fall or if you hit your head (even if there is no bleeding).  Some foods or medicines interact with Coumadin (warfarin) and might alter your response to warfarin. To help avoid this: ? Eat a balanced diet, maintaining a consistent amount of Vitamin K. ? Notify your provider about major diet changes you plan to make. ? Avoid alcohol or limit your intake to 1 drink for women and 2 drinks for men per day. (1 drink is 5 oz. wine, 12 oz. beer, or 1.5 oz. liquor.)  Make sure that ANY health care provider who prescribes medication for you knows that you are taking Coumadin (warfarin).  Also make sure the healthcare provider who is monitoring your Coumadin knows when you have started a new medication including herbals and non-prescription products.  Coumadin (Warfarin)  Major Drug Interactions  Increased Warfarin Effect Decreased Warfarin Effect  Alcohol (large quantities) Antibiotics (esp. Septra/Bactrim, Flagyl, Cipro) Amiodarone  (Cordarone) Aspirin (ASA) Cimetidine (Tagamet) Megestrol (Megace) NSAIDs (ibuprofen, naproxen, etc.) Piroxicam (Feldene) Propafenone (Rythmol SR) Propranolol (Inderal) Isoniazid (INH) Posaconazole (Noxafil) Barbiturates (Phenobarbital) Carbamazepine (Tegretol) Chlordiazepoxide (Librium) Cholestyramine (Questran) Griseofulvin Oral Contraceptives Rifampin Sucralfate (Carafate) Vitamin K   Coumadin (Warfarin) Major Herbal Interactions  Increased Warfarin Effect Decreased Warfarin Effect  Garlic Ginseng Ginkgo biloba Coenzyme Q10 Green tea St. Johns wort    Coumadin (Warfarin) FOOD Interactions  Eat a consistent number of servings per week of foods HIGH in Vitamin K (1 serving =  cup)  Collards (cooked, or boiled & drained) Kale (cooked, or boiled & drained) Mustard greens (cooked, or boiled & drained) Parsley *serving size only =  cup Spinach (cooked, or boiled & drained) Swiss chard (cooked, or boiled & drained) Turnip greens (cooked, or boiled & drained)  Eat a consistent number of servings per week of foods MEDIUM-HIGH in Vitamin K (1 serving = 1 cup)  Asparagus (cooked, or boiled & drained) Broccoli (cooked, boiled & drained, or raw & chopped) Brussel sprouts (cooked, or boiled & drained) *serving size only =  cup Lettuce, raw (green leaf, endive, romaine) Spinach, raw Turnip greens, raw & chopped   These websites have more information on Coumadin (warfarin):  FailFactory.se; VeganReport.com.au;

## 2014-08-24 NOTE — Progress Notes (Signed)
Report called to RN at [facility]. Patient transported in a stable condition to facility.

## 2014-08-24 NOTE — Progress Notes (Signed)
Patient ID: [name], male   DOB: November 07, 1925, 80 y.o.   MRN: [id number]   SUBJECTIVE: Some pain in right hip.   No dyspnea.  Remains in NSR.  Low grade fever to 100.5 last night.    Scheduled Meds: . amiodarone  200 mg Oral BID  . digoxin  0.0625 mg Oral Q48H  . docusate sodium  100 mg Oral BID  . furosemide  20 mg Oral QPM  . furosemide  40 mg Oral QPC breakfast  . levothyroxine  100 mcg Oral QAC breakfast  . lisinopril  2.5 mg Oral Daily  . metoprolol succinate  12.5 mg Oral BID  . MILK THISTLE XTRA  1 capsule Oral TID  . potassium chloride  20 mEq Oral Daily  . pravastatin  80 mg Oral Daily  . tamsulosin  0.4 mg Oral QHS  . Warfarin - Pharmacist Dosing Inpatient   Does not apply q1800  . zolpidem  5 mg Oral QHS   Continuous Infusions: . sodium chloride 10 mL/hr at 08/23/14 0700   PRN Meds:.acetaminophen **OR** acetaminophen, HYDROcodone-acetaminophen, menthol-cetylpyridinium **OR** phenol, methocarbamol **OR** methocarbamol (ROBAXIN)  IV, metoCLOPramide **OR** metoCLOPramide (REGLAN) injection, morphine injection, ondansetron **OR** ondansetron (ZOFRAN) IV, traMADol    Filed Vitals:   08/23/14 2027 08/24/14 0000 08/24/14 0400 08/24/14 0553  BP: 107/52   98/45  Pulse: 57   65  Temp: 98.1 F (36.7 C)   98.8 F (37.1 C)  TempSrc:    Oral  Resp: 16 16 16    Height:      Weight:      SpO2: 98% 97% 95% 93%    Intake/Output Summary (Last 24 hours) at 08/24/14 1018 Last data filed at 08/24/14 0700  Gross per 24 hour  Intake 342.67 ml  Output   1150 ml  Net -807.33 ml    LABS: Basic Metabolic Panel:  Recent Labs  08/23/14 0430 08/24/14 0512  NA 134* 136*  K 4.2 3.9  CL 98 98  CO2 21 26  GLUCOSE 94 107*  BUN 24* 26*  CREATININE 1.34 1.37*  CALCIUM 7.7* 8.0*   Liver Function Tests: No results for input(s): AST, ALT, ALKPHOS, BILITOT, PROT, ALBUMIN in the last 72 hours. No results for input(s): LIPASE, AMYLASE in the last 72 hours. CBC:  Recent Labs  08/23/14 0430 08/24/14 0512  WBC 13.3* 13.6*  HGB 9.4* 9.7*  HCT 27.8* 28.6*  MCV 91.1 90.8  PLT 130* 160   Cardiac Enzymes: No results for input(s): CKTOTAL, CKMB, CKMBINDEX, TROPONINI in the last 72 hours. BNP: Invalid input(s): POCBNP D-Dimer: No results for input(s): DDIMER in the last 72 hours. Hemoglobin A1C: No results for input(s): HGBA1C in the last 72 hours. Fasting Lipid Panel: No results for input(s): CHOL, HDL, LDLCALC, TRIG, CHOLHDL, LDLDIRECT in the last 72 hours. Thyroid Function Tests: No results for input(s): TSH, T4TOTAL, T3FREE, THYROIDAB in the last 72 hours.  Invalid input(s): FREET3 Anemia Panel: No results for input(s): VITAMINB12, FOLATE, FERRITIN, TIBC, IRON, RETICCTPCT in the last 72 hours.  RADIOLOGY: Dg Chest 2 View  08/23/2014   CLINICAL DATA:  Fever.  EXAM: CHEST  2 VIEW  COMPARISON:  05/23/2014.  FINDINGS: Mediastinum and hilar structures normal. Bibasilar mild subsegmental atelectasis and or infiltrates. Small left pleural effusion. Cardiomegaly. No acute bony abnormality.  IMPRESSION: Mild bibasilar subsegmental atelectasis and or infiltrates.   Electronically Signed   By: Marcello Moores  Register   On: 08/23/2014 08:59   Dg Pelvis 1-2 Views  08/20/2014  CLINICAL DATA:  Fall  EXAM: PELVIS - 1-2 VIEW  COMPARISON:  None.  FINDINGS: Right femoral neck fracture with mild displacement. Hip joint appears normal bilaterally. No other fracture identified.  IMPRESSION: Right femoral neck fracture.   Electronically Signed   By: Franchot Gallo M.D.   On: 08/20/2014 20:20   Dg Wrist Complete Right  08/20/2014   CLINICAL DATA:  Acute right wrist pain after fall.  EXAM: RIGHT WRIST - COMPLETE 3+ VIEW  COMPARISON:  None.  FINDINGS: There is no evidence of fracture or dislocation. Narrowing and sclerosis is seen involving the joint space between the scaphoid and trapezium, as well as the first carpometacarpal joint. Soft tissues are unremarkable.  IMPRESSION: Findings  consistent with osteoarthritis as described above. No acute abnormality seen in the right wrist.   Electronically Signed   By: Sabino Dick M.D.   On: 08/20/2014 20:23   Dg Femur Right  08/20/2014   CLINICAL DATA:  Fall with right hip pain.  EXAM: RIGHT FEMUR - 2 VIEW  COMPARISON:  None.  FINDINGS: There is a fracture of the proximal right femur. Findings are consistent with a subcapital fracture. Mild elevation of the distal fragment. The right femoral head is located. No acute bone abnormality in the mid and distal femur.  IMPRESSION: Subcapital right femoral fracture.   Electronically Signed   By: Markus Daft M.D.   On: 08/20/2014 20:22   Ct Head Wo Contrast  08/20/2014   CLINICAL DATA:  Fall  EXAM: CT HEAD WITHOUT CONTRAST  TECHNIQUE: Contiguous axial images were obtained from the base of the skull through the vertex without intravenous contrast.  COMPARISON:  None.  FINDINGS: Moderate atrophy. Chronic microvascular ischemic changes in the white matter. Chronic infarct right cerebellum.  Negative for acute infarct. Negative for hemorrhage or mass. No shift of the midline structures. Negative for fracture.  IMPRESSION: Atrophy and chronic ischemia.  No acute abnormality.   Electronically Signed   By: Franchot Gallo M.D.   On: 08/20/2014 20:37   Pelvis Portable  08/21/2014   CLINICAL DATA:  Postop right hip arthroplasty  EXAM: PORTABLE PELVIS 1-2 VIEWS  COMPARISON:  08/20/2014  FINDINGS: Single portable view of the pelvis submitted. There is right hip prosthesis in anatomic alignment. Lateral skin staples are noted.  IMPRESSION: Right hip prosthesis in anatomic alignment.   Electronically Signed   By: Lahoma Crocker M.D.   On: 08/21/2014 16:25    PHYSICAL EXAM General: NAD Neck: No JVD, no thyromegaly or thyroid nodule.  Lungs: Clear to auscultation bilaterally with normal respiratory effort. CV: Nondisplaced PMI.  Heart regular S1/S2, no S3/S4, 2/6 HSM apex.  No peripheral edema.  No carotid bruit.   Normal pedal pulses.  Abdomen: Soft, nontender, no hepatosplenomegaly, no distention.  Neurologic: Alert and oriented x 3.  Psych: Normal affect. Extremities: No clubbing or cyanosis.   TELEMETRY: Reviewed telemetry pt in NSR  ASSESSMENT AND PLAN: 80 yo with history of CAD, ischemic cardiomyopathy (EF 30-35% on last echo with moderate-severe MR), paroxysmal atrial fibrillation and atrial tachycardia presented with mechanical fall and right hip fracture, now s/p surgery.  1. Right hip fracture: s/p surgery.  Will need rehab placement. Continue PT work.  2. Atrial arrhythmias: Maintaining NSR, continue home amiodarone dosing. Continue coumadin.  3. Chronic systolic CHF: EF 58-09% on 2012 echo with moderate-severe MR.  Volume looks stable. Continue home dosing of digoxin, lisinopril, Lasix, and Toprol XL.  Digoxin level ok.    4.  CKD: Creatinine stable.  5. CAD: No chest pain. Continue statin.  He is on warfarin so not on ASA.   6. ID: Temp to 100.5 last night, mildly elevated WBCs.  May be post-op.  He has had frequent UTIs: UA not markedly abnormal, urine culture pending.  CXR with probable atelectasis.  Would be reasonable to watch one more day given age and frailty.   Loralie Champagne 08/24/2014 10:18 AM

## 2014-08-24 NOTE — Progress Notes (Signed)
Received prescreen request for inpatient rehab from PT and have reviewed pt's case. It is documented that pt speaks only [place]. Per Education officer, museum, "Pt's son-in-law agreeable to PT recommendation for SNF placement at time of discharge. Pt's son-in-law expressed preference for [facility] or [facility] as both have PT that speak [place] (per pt's son-in-law)." Based on family's preference for [facility] or [facility], we would recommend that SNF be pursued.  No need for MD rehab consult at this time. Thank you.  Nanetta Batty, PT Rehabilitation Admissions Coordinator [phone]

## 2014-08-24 NOTE — Discharge Summary (Signed)
[facility] Discharge Summary  Patient name: [name] Medical record number: [id number] Date of birth: 1925-11-30 Age: 80 y.o. Gender: male Date of Admission: 08/20/2014  Date of Discharge:08/24/2014  Admitting Physician: Blane Ohara McDiarmid, MD  Primary Care Provider: [name], MD Consultants: Orthopedics, cardiology  Indication for Hospitalization: Femur fracture  Discharge Diagnoses/Problem List:  Femur fracture, CKD, CAD, sCHF, HTN, Afib  Disposition: SNF  Discharge Condition: Improved  Discharge Exam:  Filed Vitals:   08/24/14 0553  BP: 98/45  Pulse: 65  Temp: 98.8 F (37.1 C)  Resp:   General: NAD, resting comfortably in bed Cardiovascular: bradycardic, regular rhythm Respiratory: CTAB, no rales or wheezes Abdomen: s, NT, ND Extremities: no edema MSK: Incision clean dry and intact, DP and PT pulses palpable bilaterally with good capillary refill  Brief Hospital Course:  [name] is a 80 y.o. Male who presented with femur fracture . PMH is significant for HTN, HLD, hypothyroidism, BPH, CAD with mitral insufficiency, chronic systolic CHF, peripheral vascular disease, paroxysmal atrial flutter/fib, and CKD stage III.   His hospital course, by problem, is listed below:  Right femoral fracture. Patient presented with right femur fracture after stepping off a curb. Orthopedics was consulted and the patient underwent right hip hemiarthroplasty. He tolerated the procedure well with no complications. He had no significant post-op complications. He was evaluated by PT and OT post op, who recommended SNF placement.    Recent UTI. Associated with patient's chronic indwelling foley catheter. He was continued on oral antibiotics. His repeat urine culture here was negative and his antibiotics were stopped.   Hematuria. Patient's UA was remarkable for RBCs on microscopy, possibly related to chronic indwelling foley catheter. No recent  trauma or catheter changes. No further workup was performed during this admission.  Acute kidney injury on Chronic kidney disease stage III. Likely prerenal. Creatine trended back to baseline by time of discharge.   The rest of the patient's chronic medical conditions were stable and the patient was continued on his home regimen.   Issues for Follow Up:  1) Hematuria - Evaluate for resolution, may need urology consult if persists 2) f/u muscle pain/spasms - Patient started on statin here, has had problems with muscle pain with prior statins.   Significant Procedures: Right hip hemiarthoplasty 11/28  Significant Labs and Imaging:   Recent Labs Lab 08/22/14 0015 08/23/14 0430 08/24/14 0512  WBC 10.8* 13.3* 13.6*  HGB 11.1* 9.4* 9.7*  HCT 33.8* 27.8* 28.6*  PLT 163 130* 160    Recent Labs Lab 08/20/14 1934 08/21/14 0320 08/22/14 0015 08/23/14 0430 08/24/14 0512  NA 137 137 137 134* 136*  K 4.5 4.1 4.2 4.2 3.9  CL 99 101 101 98 98  CO2 24 22 23 21 26   GLUCOSE 116* 102* 122* 94 107*  BUN 35* 31* 24* 24* 26*  CREATININE 2.03* 1.65* 1.45* 1.34 1.37*  CALCIUM 8.4 8.3* 7.8* 7.7* 8.0*  ALKPHOS  --  57  --   --   --   AST  --  50*  --   --   --   ALT  --  37  --   --   --   ALBUMIN  --  3.0*  --   --   --     Dg Pelvis 1-2 Views  08/20/2014   CLINICAL DATA:  Fall  EXAM: PELVIS - 1-2 VIEW  COMPARISON:  None.  FINDINGS: Right femoral neck fracture with mild displacement. Hip joint appears  normal bilaterally. No other fracture identified.  IMPRESSION: Right femoral neck fracture.   Electronically Signed   By: Franchot Gallo M.D.   On: 08/20/2014 20:20   Dg Wrist Complete Right  08/20/2014   CLINICAL DATA:  Acute right wrist pain after fall.  EXAM: RIGHT WRIST - COMPLETE 3+ VIEW  COMPARISON:  None.  FINDINGS: There is no evidence of fracture or dislocation. Narrowing and sclerosis is seen involving the joint space between the scaphoid and trapezium, as well as the first  carpometacarpal joint. Soft tissues are unremarkable.  IMPRESSION: Findings consistent with osteoarthritis as described above. No acute abnormality seen in the right wrist.   Electronically Signed   By: Sabino Dick M.D.   On: 08/20/2014 20:23   Dg Femur Right  08/20/2014   CLINICAL DATA:  Fall with right hip pain.  EXAM: RIGHT FEMUR - 2 VIEW  COMPARISON:  None.  FINDINGS: There is a fracture of the proximal right femur. Findings are consistent with a subcapital fracture. Mild elevation of the distal fragment. The right femoral head is located. No acute bone abnormality in the mid and distal femur.  IMPRESSION: Subcapital right femoral fracture.   Electronically Signed   By: Markus Daft M.D.   On: 08/20/2014 20:22   Ct Head Wo Contrast  08/20/2014   CLINICAL DATA:  Fall  EXAM: CT HEAD WITHOUT CONTRAST  TECHNIQUE: Contiguous axial images were obtained from the base of the skull through the vertex without intravenous contrast.  COMPARISON:  None.  FINDINGS: Moderate atrophy. Chronic microvascular ischemic changes in the white matter. Chronic infarct right cerebellum.  Negative for acute infarct. Negative for hemorrhage or mass. No shift of the midline structures. Negative for fracture.  IMPRESSION: Atrophy and chronic ischemia.  No acute abnormality.   Electronically Signed   By: Franchot Gallo M.D.   On: 08/20/2014 20:37   Pelvis Portable  08/21/2014   CLINICAL DATA:  Postop right hip arthroplasty  EXAM: PORTABLE PELVIS 1-2 VIEWS  COMPARISON:  08/20/2014  FINDINGS: Single portable view of the pelvis submitted. There is right hip prosthesis in anatomic alignment. Lateral skin staples are noted.  IMPRESSION: Right hip prosthesis in anatomic alignment.   Electronically Signed   By: Lahoma Crocker M.D.   On: 08/21/2014 16:25    Results/Tests Pending at Time of Discharge: None  Discharge Medications:    Medication List    STOP taking these medications        amoxicillin-clavulanate 875-125 MG per tablet   Commonly known as:  AUGMENTIN      TAKE these medications        amiodarone 200 MG tablet  Commonly known as:  PACERONE  Take 1 tablet (200 mg total) by mouth 2 (two) times daily.     COQ10 PO  Take 1 tablet by mouth daily.     digoxin 0.125 MG tablet  Commonly known as:  LANOXIN  Take 0.5 tablets (0.0625 mg total) by mouth every other day.     DSS 100 MG Caps  Take 100 mg by mouth 2 (two) times daily.     Fish Oil 1200 MG Caps  Take 1 capsule (1,200 mg total) by mouth daily.     furosemide 40 MG tablet  Commonly known as:  LASIX  Take 1 tab in AM and 1/2 tab in PM     HYDROcodone-acetaminophen 5-325 MG per tablet  Commonly known as:  NORCO/VICODIN  Take 1-2 tablets by mouth every 6 (six)  hours as needed for moderate pain.     levothyroxine 100 MCG tablet  Commonly known as:  SYNTHROID, LEVOTHROID  Take 1 tablet (100 mcg total) by mouth daily before breakfast.     lisinopril 2.5 MG tablet  Commonly known as:  PRINIVIL,ZESTRIL  Take 1 tablet (2.5 mg total) by mouth daily.     metoprolol succinate 25 MG 24 hr tablet  Commonly known as:  TOPROL XL  Take 1 tablet (25 mg total) by mouth daily.     MILK THISTLE PO  Take 325 mg by mouth 3 (three) times daily.     multivitamin with minerals Tabs tablet  Take 1 tablet by mouth daily.     potassium chloride 10 MEQ tablet  Commonly known as:  K-DUR,KLOR-CON  Take 20 mEq by mouth daily.     pravastatin 80 MG tablet  Commonly known as:  PRAVACHOL  Take 1 tablet (80 mg total) by mouth daily.     tamsulosin 0.4 MG Caps capsule  Commonly known as:  FLOMAX  Take 0.4 mg by mouth at bedtime.     warfarin 1 MG tablet  Commonly known as:  COUMADIN  Take 1-2 tablets (1-2 mg total) by mouth daily. As directed     zolpidem 10 MG tablet  Commonly known as:  AMBIEN  Take 10 mg by mouth at bedtime.        Discharge Instructions: Please refer to Patient Instructions section of EMR for full details.  Patient was counseled  important signs and symptoms that should prompt return to medical care, changes in medications, dietary instructions, activity restrictions, and follow up appointments.   Follow-Up Appointments: Follow-up Information    Follow up with Mcarthur Rossetti, MD. Schedule an appointment as soon as possible for a visit in 2 weeks.   Specialty:  Orthopedic Surgery   Contact information:   [place] [address] [phone]       Follow up with [name], MD.   Specialty:  [facility]   Contact information:   [place] [address] [phone]       Dimas Chyle, MD 08/24/2014, 12:47 PM PGY-1, [facility]

## 2014-08-24 NOTE — Progress Notes (Signed)
FMTS Attending Note Patient seen and examined by me, son-in-law at bedside.  Ortho CM also at bedside, patient has bed at SNF of his/family's choosing and is ready for discharge. Denies complaints of cough or shortness of breath. Pain well controlled, POD#2 s/p R hip arthroplasty. Low-grade temperature and mildly elevated WBC count (13). CXR done yesterday inconclusive atelectasis vs infiltrate. UA non-contributory.  This morning he is awake and alert.  Lungs are clear bilaterally, without wheezes or rales.  A/P: Patient s/p R hip hemiarthroplasty POD#2. Discussed mild leukocytosis and low-grade temp with Dr Ninfa Linden, in keeping with usual post-op course at this stage.  To proceed with plans for discharge to SNF. To cancel CIR consult that had been ordered for today.  Son-in-law asks that probiotics be reinstated in his medication regimen.  Dalbert Mayotte, MD

## 2014-08-24 NOTE — Progress Notes (Signed)
Patient ID: [name], male   DOB: 11/05/1925, 80 y.o.   MRN: [id number] He is making good progress.  His hip incision looks good and he is mobilizing better.  I feel like he can go to SNF today.  His WBC is only slightly elevated and this seems to be more related to his pain as well as lack of BM.  His last elevated temp was 100 yesterday afternoon.

## 2014-08-25 ENCOUNTER — Non-Acute Institutional Stay (SKILLED_NURSING_FACILITY): Payer: Medicare Other | Admitting: Registered Nurse

## 2014-08-25 ENCOUNTER — Encounter: Payer: Self-pay | Admitting: Registered Nurse

## 2014-08-25 ENCOUNTER — Other Ambulatory Visit: Payer: Self-pay | Admitting: *Deleted

## 2014-08-25 DIAGNOSIS — R6889 Other general symptoms and signs: Secondary | ICD-10-CM

## 2014-08-25 DIAGNOSIS — Z049 Encounter for examination and observation for unspecified reason: Secondary | ICD-10-CM

## 2014-08-25 DIAGNOSIS — I480 Paroxysmal atrial fibrillation: Secondary | ICD-10-CM

## 2014-08-25 DIAGNOSIS — E039 Hypothyroidism, unspecified: Secondary | ICD-10-CM

## 2014-08-25 DIAGNOSIS — I5022 Chronic systolic (congestive) heart failure: Secondary | ICD-10-CM

## 2014-08-25 DIAGNOSIS — R509 Fever, unspecified: Secondary | ICD-10-CM

## 2014-08-25 DIAGNOSIS — G4700 Insomnia, unspecified: Secondary | ICD-10-CM

## 2014-08-25 DIAGNOSIS — E785 Hyperlipidemia, unspecified: Secondary | ICD-10-CM

## 2014-08-25 DIAGNOSIS — N401 Enlarged prostate with lower urinary tract symptoms: Secondary | ICD-10-CM

## 2014-08-25 DIAGNOSIS — N138 Other obstructive and reflux uropathy: Secondary | ICD-10-CM

## 2014-08-25 DIAGNOSIS — S7291XS Unspecified fracture of right femur, sequela: Secondary | ICD-10-CM

## 2014-08-25 DIAGNOSIS — K5901 Slow transit constipation: Secondary | ICD-10-CM

## 2014-08-25 LAB — HEPATIC FUNCTION PANEL
ALK PHOS: 61 U/L (ref 25–125)
ALT: 31 U/L (ref 10–40)
AST: 77 U/L — AB (ref 14–40)
Bilirubin, Total: 1.2 mg/dL

## 2014-08-25 LAB — BASIC METABOLIC PANEL
BUN: 31 mg/dL — AB (ref 4–21)
Creatinine: 1.1 mg/dL (ref 0.6–1.3)
Glucose: 117 mg/dL
POTASSIUM: 3.8 mmol/L (ref 3.4–5.3)
Sodium: 133 mmol/L — AB (ref 137–147)

## 2014-08-25 LAB — CBC AND DIFFERENTIAL
HCT: 30 % — AB (ref 41–53)
HEMOGLOBIN: 10.1 g/dL — AB (ref 13.5–17.5)
Platelets: 183 10*3/uL (ref 150–399)
WBC: 14.2 10*3/mL

## 2014-08-25 MED ORDER — ZOLPIDEM TARTRATE 10 MG PO TABS: ORAL_TABLET | ORAL | Status: DC

## 2014-08-25 MED ORDER — HYDROCODONE-ACETAMINOPHEN 5-325 MG PO TABS: ORAL_TABLET | ORAL | Status: DC

## 2014-08-25 NOTE — Progress Notes (Signed)
This encounter was created in error - please disregard.

## 2014-08-25 NOTE — Telephone Encounter (Signed)
[facility] 

## 2014-08-25 NOTE — Progress Notes (Addendum)
Patient ID: [name], male   DOB: 05-06-1926, 80 y.o.   MRN: [id number]   Place of Service: [facility]  Allergies  Allergen Reactions  . Ciprofloxacin Nausea And Vomiting and Other (See Comments)    Possible tendinopathy   . Ezetimibe-Simvastatin Other (See Comments)     spasms  . Rosuvastatin Other (See Comments)    Leg spasms  . Atenolol Rash    Code Status: Full Code  Goals of Care: Longevity/STR  Chief Complaint  Patient presents with  . Hospitalization Follow-up    HPI 80 y.o. [place] male with PMH of paroxsymal afib, chronic systolic CHF, CAD with mitral insufficiency, hypothyroidism, among others is being seen for a follow-up visit post hospital admission from 08/20/14 to 08/24/14 for Right closed femur/hip fracture s/p hip arthroplasty. He is now here for short term rehab. Patient speaks only [place]. Son-in-law and daughter at bedside and are available for interpretation over the phone as needed. The patient developed a low grade fever overnight and daughter is very concerned about  infections and would like to treat him immediately if possible. His family is very involved in his medical care and requested for his temp to checked several times a day as well as checking his HR regularly to ensure it is between 55 to 60. She has requested for me to contact their family's physician, Dr. [name] and give him an update on the patient.   Review of Systems (through daughter and son-in-law) Constitutional: Positive for fever. Negative for chills and fatigue. HENT: Negative for facial swelling, ear pain, congestion, and sore throat Eyes: Negative for eye pain, eye discharge, and visual disturbance  Cardiovascular: Negative for chest pain, palpitations, and leg swelling Respiratory: Negative cough, shortness of breath, and wheezing.  Gastrointestinal: Negative for nausea and vomiting. Negative for abdominal pain. Positive for constipation Genitourinary:  Negative for dysuria. Positive for chronic indwelling foley cath  Endocrine: Negative for polydipsia, polyphagia, and polyuria Musculoskeletal: Negative for back pain. Positive for left arm pain and right hip pain Neurological: Negative for dizziness and headache.  Skin: Negative for rash. Positive for surgical wound and wound on buttocks Psychiatric: Negative for depression  Past Medical History  Diagnosis Date  . CAD (coronary artery disease)     a. LHC 4/06 with 60% pLAD, 40% ramus, 60% mRCA, and 90% mPDA. No intervention. Inferior akinesis on LV-gram was suggestive of possible inferior MI with recanalization.  . Hyperlipidemia     a. Myalgias with Lipitor and Crestor.  . Hypothyroidism   . Peripheral vascular disease   . Anxiety   . Hx of colonic polyps   . Paroxysmal atrial fibrillation 02/2009     a. With RVR tx with amiodarone returning to NSR 02/2009. b. Had atypical aflutter vs atrial tach 4/14, converted to NSR 4/14. In atypical flutter vs atrial tach in 1/15, DCCV 1/15 to NSR. c. Atrial tach 7/15 with junctional escape 60s.  . Systolic CHF     a.  Probable ischemic cardiomyopathy. Echo (6/10) with EF 40%, mildly dilated LV, moderate MR. ;  b.  echo 3/12: mild LVH, EF 30-35%, IL HK and Lat HK, mod to severe MR, trivial AI, mod LAE  . PNA (pneumonia)     History of   . Mitral regurgitation     a. Moderate by echo 6/10. b. 2-3+ by cath 2006; mod to severe by echo 3/12  . PVC's (premature ventricular contractions)   . Cataract   . Heart murmur   .  CKD (chronic kidney disease), stage III   . Skin cancer     Hx of 2008 BCC  . Chronic indwelling Foley catheter     per family, bladder muscles no longer work  . Depression   . Myocardial infarction   . Basal cell carcinoma   . Paroxysmal atrial tachycardia     a. See details under PAF.  Marland Kitchen Paroxysmal atrial flutter     a. See details under PAF.  Marland Kitchen BPH (benign prostatic hyperplasia)   . Recurrent UTI   . Imbalance   . Carotid  stenosis     a. Dopplers 2/15 - 40-59% BICA.   Past Surgical History  Procedure Laterality Date  . Skin cancer excision  2008    shoulder and nose   . Cardioversion N/A 01/02/2013    Procedure: CARDIOVERSION;  Surgeon: Larey Dresser, MD;  Location: [facility];  Service: Cardiovascular;  Laterality: N/A;  . Cardioversion N/A 10/23/2013    Procedure: CARDIOVERSION;  Surgeon: Larey Dresser, MD;  Location: [facility];  Service: Cardiovascular;  Laterality: N/A;  . Hip arthroplasty Right 08/21/2014    Procedure: ARTHROPLASTY BIPOLAR HIP;  Surgeon: Mcarthur Rossetti, MD;  Location: [facility];  Service: Orthopedics;  Laterality: Right;   History   Social History  . Marital Status: Married    Spouse Name: N/A    Number of Children: N/A  . Years of Education: N/A   Occupational History  . Not on file.   Social History Main Topics  . Smoking status: Never Smoker   . Smokeless tobacco: Not on file  . Alcohol Use: No  . Drug Use: No  . Sexual Activity: No   Other Topics Concern  . Not on file   Social History Narrative   Retired. Originally from [place], speaks little [place]. Married. Daughter- Leota Sauers, her cell number is [phone]    Family History  Problem Relation Age of Onset  . Coronary artery disease Other     male, 1st degree relative <60   . Heart disease Mother   . Heart attack Mother       Medication List       This list is accurate as of: 08/25/14  9:36 PM.  Always use your most recent med list.               amiodarone 200 MG tablet  Commonly known as:  PACERONE  Take 1 tablet (200 mg total) by mouth 2 (two) times daily.     COQ10 PO  Take 1 tablet by mouth daily.     digoxin 0.125 MG tablet  Commonly known as:  LANOXIN  Take 0.5 tablets (0.0625 mg total) by mouth every other day.     DSS 100 MG Caps  Take 100 mg by mouth 2 (two) times daily.     Fish Oil 1200 MG Caps  Take 1 capsule (1,200 mg total) by mouth daily.      furosemide 40 MG tablet  Commonly known as:  LASIX  Take 1 tab in AM and 1/2 tab in PM     levothyroxine 100 MCG tablet  Commonly known as:  SYNTHROID, LEVOTHROID  Take 1 tablet (100 mcg total) by mouth daily before breakfast.     lisinopril 2.5 MG tablet  Commonly known as:  PRINIVIL,ZESTRIL  Take 1 tablet (2.5 mg total) by mouth daily.     metoprolol succinate 25 MG 24 hr tablet  Commonly known as:  TOPROL XL  Take 1 tablet (25 mg total) by mouth daily.     MILK THISTLE PO  Take 325 mg by mouth 3 (three) times daily.     multivitamin with minerals Tabs tablet  Take 1 tablet by mouth daily.     polyethylene glycol packet  Commonly known as:  MIRALAX / GLYCOLAX  Take 17 g by mouth daily as needed.     potassium chloride 10 MEQ tablet  Commonly known as:  K-DUR,KLOR-CON  Take 20 mEq by mouth daily.     pravastatin 80 MG tablet  Commonly known as:  PRAVACHOL  Take 1 tablet (80 mg total) by mouth daily.     Senna 8.6-50 MG Tabs  Take 2 tablets by mouth daily.     tamsulosin 0.4 MG Caps capsule  Commonly known as:  FLOMAX  Take 0.4 mg by mouth at bedtime.     warfarin 1 MG tablet  Commonly known as:  COUMADIN  Take 1-2 tablets (1-2 mg total) by mouth daily. As directed     zolpidem 10 MG tablet  Commonly known as:  AMBIEN  Take one tablet by mouth every night at bedtime for rest        Physical Exam Filed Vitals:   08/25/14 2125  BP: 95/50  Pulse: 60  Temp: 99.7 F (37.6 C)  Resp: 18   Constitutional: frail elderly male in no acute distress. Speaks [place]. Pleasant.  HEENT: Normocephalic and atraumatic. PERRL. EOM intact. No icterus. Oral mucosa moist. Posterior pharynx clear of any exudate or lesions.   Neck: Supple and nontender. No lymphadenopathy, masses, or thyromegaly. No JVD or carotid bruits. Cardiac: Normal S1, S2. RRR with 2/6 holosystolic murmur. Distal pulses intact. Trace pitting edema of BLE Lungs: No respiratory distress. Breath sounds  diminished bilaterally without rales, rhonchi, or wheezes. Abdomen: Audible bowel sounds in all quadrants. Soft, nontender, nondistended. Musculoskeletal: able to move all extremities. Strength diminished throughout. No joint erythema or tenderness.  GU: foley cath in place Skin: Warm and dry. No rash noted. Sacral area erythematous, DTI suspected. Opened blister on Right buttock. Surgical incision on right hip. No signs of infections.  Neurological: Alert and oriented to person Psychiatric: Appropriate mood and affect.   Labs Reviewed  CBC Latest Ref Rng 08/24/2014 08/23/2014 08/22/2014  WBC 4.0 - 10.5 K/uL 13.6(H) 13.3(H) 10.8(H)  Hemoglobin 13.0 - 17.0 g/dL 9.7(L) 9.4(L) 11.1(L)  Hematocrit 39.0 - 52.0 % 28.6(L) 27.8(L) 33.8(L)  Platelets 150 - 400 K/uL 160 130(L) 163    CMP Latest Ref Rng 08/24/2014 08/23/2014 08/22/2014  Glucose 70 - 99 mg/dL 107(H) 94 122(H)  BUN 6 - 23 mg/dL 26(H) 24(H) 24(H)  Creatinine 0.50 - 1.35 mg/dL 1.37(H) 1.34 1.45(H)  Sodium 137 - 147 mEq/L 136(L) 134(L) 137  Potassium 3.7 - 5.3 mEq/L 3.9 4.2 4.2  Chloride 96 - 112 mEq/L 98 98 101  CO2 19 - 32 mEq/L 26 21 23   Calcium 8.4 - 10.5 mg/dL 8.0(L) 7.7(L) 7.8(L)  Total Protein 6.0 - 8.3 g/dL - - -  Total Bilirubin 0.3 - 1.2 mg/dL - - -  Alkaline Phos 39 - 117 U/L - - -  AST 0 - 37 U/L - - -  ALT 0 - 53 U/L - - -    Lab Results  Component Value Date   HGBA1C 5.6 02/04/2008    Lab Results  Component Value Date   TSH 4.720* 07/05/2014    Lipid Panel     Component Value Date/Time  CHOL 176 01/04/2014 0942   TRIG 107.0 01/04/2014 0942   HDL 62.40 01/04/2014 0942   CHOLHDL 3 01/04/2014 0942   VLDL 21.4 01/04/2014 0942   LDLCALC 92 01/04/2014 0942   LDLDIRECT 133.2 01/12/2011 1013   LDLDIRECT 173.4 07/11/2006 0948   Urinalysis    Component Value Date/Time   COLORURINE AMBER* 08/23/2014 0815   APPEARANCEUR CLOUDY* 08/23/2014 0815   LABSPEC 1.020 08/23/2014 0815   PHURINE 5.0 08/23/2014 0815    GLUCOSEU NEGATIVE 08/23/2014 0815   GLUCOSEU NEGATIVE 10/08/2007 0823   HGBUR LARGE* 08/23/2014 0815   BILIRUBINUR NEGATIVE 08/23/2014 0815   BILIRUBINUR neg 08/11/2014 1928   KETONESUR NEGATIVE 08/23/2014 0815   PROTEINUR 30* 08/23/2014 0815   PROTEINUR neg 08/11/2014 1928   UROBILINOGEN 0.2 08/23/2014 0815   UROBILINOGEN 0.2 08/11/2014 1928   NITRITE NEGATIVE 08/23/2014 0815   NITRITE positive 08/11/2014 1928   LEUKOCYTESUR SMALL* 08/23/2014 0815    Diagnostic Studies Reviewed 08/21/2014 PORTABLE PELVIS:  There is right hip prosthesis in anatomic alignment. Lateral skin staples are noted.Right hip prosthesis in anatomic alignment.  08/23/14 CXR: Mediastinum and hilar structures normal. Bibasilar mild subsegmental atelectasis and or infiltrates. Small left pleural effusion. Cardiomegaly. No acute bony abnormality.  Assessment & Plan 1. Paroxysmal atrial fibrillation NSR. Continue amiodarone 200mg  twice daily. Continue oral anticoagulation with warfarin. INR today 1.8. Will adjust his coumadin to 2mg  on Wed and 1mg  on all other days. Recheck INR on 08/30/14. Continue to monitor.   2. Hypothyroidism, unspecified hypothyroidism type TSH elevated. He supposed to have his TSH recheck in November. Will continue synthroid at 182mcg daily for now. Recheck TSH and adjust dosage accordingly.   3. INSOMNIA, PERSISTENT Stable. Continue ambien 10mg  daily at bedtime and monitor  4. Femur fracture, right, sequela S/p right hip arthoplasty. Continue to work with PT/OT for gait/strength/balance training. D/c Norco per daughter's request. Continue tylenol 650mg  Q4-6H prn pain and fever. Continue to monitor  5. BPH with urinary obstruction Continue Foley cath for chronic urinary retention. Followed by urology. Continue flomax 0.4mg  daily and monitor.   6. HLD (hyperlipidemia) Last LDL 92. Continue pravachol 80mg  daily and monitor.   7. SYSTOLIC HEART FAILURE, CHRONIC Euvolemic exam.  Continue digoxin 0.0625mg  every other day, lisinopril 2.5mg  daily, metoprolol 12.5mg  twice daily, lasix 40mg  daily in the morning and 20mg  nightly, and potassium supplement 62mEQ daily. Continue to monitor. Daughter has requested for lisinopril to be discontinued earlier today. I spoke to Dr. Joseph Art and he recommended to keep it for renal protection. I informed her about Dr. Pauletta Browns recommendation. She agreed to his judgment.   8. Constipation Usual bowel pattern is daily. Complaining of constipation. Will increase senna from daily to twice daily. Start miralax 17g daily prn. Continue to monitor   9. Low grade fever Most likely post-op. WBCs trending up. Will recheck CBC with diff and CMP. Reassured family. Continue to monitor for now. Possible UA with C&S and CXR to rule out UTI and PNA in near future to rule out acute infectious processes. Most recent UA and CXR were inconclusive.   10. Suspected deep tissue injury of sacrum/blister of R buttock Continue skin care, pressure ulcer precautions, and monitor.  Diagnostic Studies/Labs Ordered: CBC with diff, CMP, TSH  Time spent: over 1 hr on counseling and care coordination.   Family/Staff Communication Plan of care discussed with daughter and nursing staff. Daughter and nursing staff verbalized understanding and agree with plan of care. No additional questions or concerns reported.  Arthur Holms, MSN, AGNP-C [facility] [address] [place], [place] [address] [phone] [8am-5pm] After hours: [phone]

## 2014-08-26 ENCOUNTER — Non-Acute Institutional Stay (SKILLED_NURSING_FACILITY): Payer: Medicare Other | Admitting: Internal Medicine

## 2014-08-26 ENCOUNTER — Other Ambulatory Visit: Payer: Self-pay | Admitting: *Deleted

## 2014-08-26 ENCOUNTER — Encounter: Payer: Self-pay | Admitting: Internal Medicine

## 2014-08-26 ENCOUNTER — Other Ambulatory Visit: Payer: Self-pay | Admitting: Family Medicine

## 2014-08-26 DIAGNOSIS — G4700 Insomnia, unspecified: Secondary | ICD-10-CM | POA: Diagnosis not present

## 2014-08-26 DIAGNOSIS — D72829 Elevated white blood cell count, unspecified: Secondary | ICD-10-CM | POA: Diagnosis not present

## 2014-08-26 DIAGNOSIS — I5022 Chronic systolic (congestive) heart failure: Secondary | ICD-10-CM | POA: Diagnosis not present

## 2014-08-26 DIAGNOSIS — L8995 Pressure ulcer of unspecified site, unstageable: Secondary | ICD-10-CM | POA: Diagnosis not present

## 2014-08-26 DIAGNOSIS — S72001D Fracture of unspecified part of neck of right femur, subsequent encounter for closed fracture with routine healing: Secondary | ICD-10-CM

## 2014-08-26 DIAGNOSIS — R066 Hiccough: Secondary | ICD-10-CM

## 2014-08-26 DIAGNOSIS — I10 Essential (primary) hypertension: Secondary | ICD-10-CM | POA: Diagnosis not present

## 2014-08-26 DIAGNOSIS — I482 Chronic atrial fibrillation, unspecified: Secondary | ICD-10-CM

## 2014-08-26 DIAGNOSIS — I480 Paroxysmal atrial fibrillation: Secondary | ICD-10-CM

## 2014-08-26 MED ORDER — GABAPENTIN 100 MG PO CAPS: 100.0000 mg | ORAL_CAPSULE | Freq: Three times a day (TID) | ORAL | Status: DC

## 2014-08-26 MED ORDER — TRAMADOL HCL 50 MG PO TABS: ORAL_TABLET | ORAL | Status: DC

## 2014-08-26 NOTE — Telephone Encounter (Signed)
[facility] 

## 2014-08-26 NOTE — Progress Notes (Signed)
Patient ID: [name], male   DOB: 1925-12-02, 80 y.o.   MRN: [id number]     Facility: [facility]    PCP: [name], MD  Code Status: full code  Allergies  Allergen Reactions  . Ciprofloxacin Nausea And Vomiting and Other (See Comments)    Possible tendinopathy   . Ezetimibe-Simvastatin Other (See Comments)     spasms  . Rosuvastatin Other (See Comments)    Leg spasms  . Atenolol Rash    Chief Complaint  Patient presents with  . New Admit To SNF     HPI:  80 y/o male patient is here for STR post hospital admission from 08/20/14 to 08/24/14 for right closed femur fracture. He is s/p right hip arthroplasty He has PMH of paroxsymal afib, chronic systolic CHF, CAD with mitral insufficiency, hypothyroidism, HTN. He speaks only [place] and his son in law is present in the room to help with interpretation.  He complaints of pain of 7-8/10 with movement and 1-2/10 at rest in right leg. Denies muscle cramps or spasm. He has hiccups at times that bothers him. He also has dry mouth. He has been having low grade temperature of 99-100.5 as per family and they are concerned for infection. This am his body temperature is 99. Denies any chills. He feels tired.   Review of Systems  Constitutional: Negative for diaphoresis.  HENT: Negative for congestion, hearing loss and sore throat.   Eyes: Negative for blurred vision, double vision and discharge.  Respiratory: Negative for cough, sputum production, shortness of breath and wheezing.   Cardiovascular: Negative for chest pain, palpitations, orthopnea and leg swelling.  Gastrointestinal: Negative for heartburn, nausea, vomiting, abdominal pain. Has constipation.  Genitourinary: has chronic indwelling foley catheter  Musculoskeletal: Negative for back pain, falls in facility Skin: Negative for itching and rash. has sores in his bottom area Neurological: Negative for dizziness, tingling, focal weakness and  headaches.  Psychiatric/Behavioral: Negative for depression and memory loss.   Past Medical History  Diagnosis Date  . CAD (coronary artery disease)     a. LHC 4/06 with 60% pLAD, 40% ramus, 60% mRCA, and 90% mPDA. No intervention. Inferior akinesis on LV-gram was suggestive of possible inferior MI with recanalization.  . Hyperlipidemia     a. Myalgias with Lipitor and Crestor.  . Hypothyroidism   . Peripheral vascular disease   . Anxiety   . Hx of colonic polyps   . Paroxysmal atrial fibrillation 02/2009     a. With RVR tx with amiodarone returning to NSR 02/2009. b. Had atypical aflutter vs atrial tach 4/14, converted to NSR 4/14. In atypical flutter vs atrial tach in 1/15, DCCV 1/15 to NSR. c. Atrial tach 7/15 with junctional escape 60s.  . Systolic CHF     a.  Probable ischemic cardiomyopathy. Echo (6/10) with EF 40%, mildly dilated LV, moderate MR. ;  b.  echo 3/12: mild LVH, EF 30-35%, IL HK and Lat HK, mod to severe MR, trivial AI, mod LAE  . PNA (pneumonia)     History of   . Mitral regurgitation     a. Moderate by echo 6/10. b. 2-3+ by cath 2006; mod to severe by echo 3/12  . PVC's (premature ventricular contractions)   . Cataract   . Heart murmur   . CKD (chronic kidney disease), stage III   . Skin cancer     Hx of 2008 BCC  . Chronic indwelling Foley catheter     per  family, bladder muscles no longer work  . Depression   . Myocardial infarction   . Basal cell carcinoma   . Paroxysmal atrial tachycardia     a. See details under PAF.  Marland Kitchen Paroxysmal atrial flutter     a. See details under PAF.  Marland Kitchen BPH (benign prostatic hyperplasia)   . Recurrent UTI   . Imbalance   . Carotid stenosis     a. Dopplers 2/15 - 40-59% BICA.   Past Surgical History  Procedure Laterality Date  . Skin cancer excision  2008    shoulder and nose   . Cardioversion N/A 01/02/2013    Procedure: CARDIOVERSION;  Surgeon: Larey Dresser, MD;  Location: [place];  Service: Cardiovascular;   Laterality: N/A;  . Cardioversion N/A 10/23/2013    Procedure: CARDIOVERSION;  Surgeon: Larey Dresser, MD;  Location: [facility] ENDOSCOPY;  Service: Cardiovascular;  Laterality: N/A;  . Hip arthroplasty Right 08/21/2014    Procedure: ARTHROPLASTY BIPOLAR HIP;  Surgeon: Mcarthur Rossetti, MD;  Location: [place];  Service: Orthopedics;  Laterality: Right;   Social History:   reports that he has never smoked. He does not have any smokeless tobacco history on file. He reports that he does not drink alcohol or use illicit drugs.  Family History  Problem Relation Age of Onset  . Coronary artery disease Other     male, 1st degree relative <60   . Heart disease Mother   . Heart attack Mother     Medications: Patient's Medications  New Prescriptions   No medications on file  Previous Medications   AMIODARONE (PACERONE) 200 MG TABLET    Take 1 tablet (200 mg total) by mouth 2 (two) times daily.   COENZYME Q10 (COQ10 PO)    Take 1 tablet by mouth daily.   DIGOXIN (LANOXIN) 0.125 MG TABLET    Take 0.5 tablets (0.0625 mg total) by mouth every other day.   FUROSEMIDE (LASIX) 40 MG TABLET    Take 1 tab in AM and 1/2 tab in PM   LEVOTHYROXINE (SYNTHROID, LEVOTHROID) 100 MCG TABLET    Take 1 tablet (100 mcg total) by mouth daily before breakfast.   LISINOPRIL (PRINIVIL,ZESTRIL) 2.5 MG TABLET    Take 1 tablet (2.5 mg total) by mouth daily.   METOPROLOL SUCCINATE (TOPROL XL) 25 MG 24 HR TABLET    Take 1 tablet (25 mg total) by mouth daily.   MILK THISTLE PO    Take 325 mg by mouth 3 (three) times daily.   MULTIPLE VITAMIN (MULTIVITAMIN WITH MINERALS) TABS TABLET    Take 1 tablet by mouth daily.   OMEGA-3 FATTY ACIDS (FISH OIL) 1200 MG CAPS    Take 1 capsule (1,200 mg total) by mouth daily.   POLYETHYLENE GLYCOL (MIRALAX / GLYCOLAX) PACKET    Take 17 g by mouth daily as needed.   POTASSIUM CHLORIDE (K-DUR,KLOR-CON) 10 MEQ TABLET    Take 20 mEq by mouth daily.   PRAVASTATIN (PRAVACHOL) 80 MG TABLET     Take 1 tablet (80 mg total) by mouth daily.   SENNOSIDES-DOCUSATE SODIUM (SENNA) 8.6-50 MG TABS    Take 2 tablets by mouth daily.   TAMSULOSIN (FLOMAX) 0.4 MG CAPS CAPSULE    Take 0.4 mg by mouth at bedtime.    TRAMADOL (ULTRAM) 50 MG TABLET    Take one tablet by mouth every 6 hours as needed for moderate to severe pain   WARFARIN (COUMADIN) 1 MG TABLET    Take  1-2 tablets (1-2 mg total) by mouth daily. As directed   ZOLPIDEM (AMBIEN) 10 MG TABLET    Take one tablet by mouth every night at bedtime for rest  Modified Medications   No medications on file  Discontinued Medications   No medications on file     Physical Exam: Filed Vitals:   08/26/14 1455  BP: 107/50  Pulse: 70  Temp: 99 F (37.2 C)  Resp: 18  SpO2: 96%    General- elderly male in no acute distress Head- atraumatic, normocephalic Eyes- no pallor, no icterus, no discharge Neck- no cervical lymphadenopathy Throat- moist mucus membrane, normal oropharynx  Cardiovascular- normal s1,s2, no murmurs, palpable dorsalis pedis Respiratory- bilateral decreased breath sounds, no wheeze, no rhonchi, no crackles, no use of accessory muscles Abdomen- bowel sounds present, soft, non tender Musculoskeletal- able to move all 4 extremities, right leg ROM limited, no leg edema  Neurological- no focal deficit Skin- warm and dry, right hip incision with mepilex dressing, moderate drainage noted, bruise extending up to right lower back region with mild edema and normal temperature to touch, has suspected deep tissue injury to his sacral area and buttock with open blister in right buttock with 100% red wound bed.Deep bruising in both arms and hands Psychiatry- alert and oriented to person, place and time, normal mood and affect   Labs reviewed: Basic Metabolic Panel:  Recent Labs  08/22/14 0015 08/23/14 0430 08/24/14 0512 08/25/14  NA 137 134* 136* 133*  K 4.2 4.2 3.9 3.8  CL 101 98 98  --   CO2 23 21 26   --   GLUCOSE 122* 94  107*  --   BUN 24* 24* 26* 31*  CREATININE 1.45* 1.34 1.37* 1.1  CALCIUM 7.8* 7.7* 8.0*  --    Liver Function Tests:  Recent Labs  08/03/14 1037 08/11/14 1921 08/21/14 0320 08/25/14  AST 97* 60* 50* 77*  ALT 67* 49 37 31  ALKPHOS 62 60 57 61  BILITOT 0.5 0.9 0.7  --   PROT 7.1 7.0 6.9  --   ALBUMIN 3.2* 3.7 3.0*  --    No results for input(s): LIPASE, AMYLASE in the last 8760 hours. No results for input(s): AMMONIA in the last 8760 hours. CBC:  Recent Labs  10/30/13 0915 04/28/14 0752  08/20/14 1934  08/22/14 0015 08/23/14 0430 08/24/14 0512 08/25/14  WBC 7.2 6.8  < > 9.7  < > 10.8* 13.3* 13.6* 14.2  NEUTROABS 4.4 3.9  --  6.4  --   --   --   --   --   HGB 12.1* 12.7*  < > 12.1*  < > 11.1* 9.4* 9.7* 10.1*  HCT 36.0* 36.7*  < > 37.1*  < > 33.8* 27.8* 28.6* 30*  MCV 93.6 92.9  < > 92.3  < > 93.1 91.1 90.8  --   PLT 147.0* 167.0  < > 200  < > 163 130* 160 183  < > = values in this interval not displayed.  Radiological Exams: Dg Pelvis 1-2 Views  08/20/2014   CLINICAL DATA:  Fall  EXAM: PELVIS - 1-2 VIEW  COMPARISON:  None.  FINDINGS: Right femoral neck fracture with mild displacement. Hip joint appears normal bilaterally. No other fracture identified.  IMPRESSION: Right femoral neck fracture.   Electronically Signed   By: Franchot Gallo M.D.   On: 08/20/2014 20:20   Dg Wrist Complete Right  08/20/2014   CLINICAL DATA:  Acute right wrist pain after fall.  EXAM: RIGHT WRIST - COMPLETE 3+ VIEW  COMPARISON:  None.  FINDINGS: There is no evidence of fracture or dislocation. Narrowing and sclerosis is seen involving the joint space between the scaphoid and trapezium, as well as the first carpometacarpal joint. Soft tissues are unremarkable.  IMPRESSION: Findings consistent with osteoarthritis as described above. No acute abnormality seen in the right wrist.   Electronically Signed   By: Sabino Dick M.D.   On: 08/20/2014 20:23   Dg Femur Right  08/20/2014   CLINICAL DATA:   Fall with right hip pain.  EXAM: RIGHT FEMUR - 2 VIEW  COMPARISON:  None.  FINDINGS: There is a fracture of the proximal right femur. Findings are consistent with a subcapital fracture. Mild elevation of the distal fragment. The right femoral head is located. No acute bone abnormality in the mid and distal femur. IMPRESSION: Subcapital right femoral fracture.   Electronically Signed   By: Markus Daft M.D.   On: 08/20/2014 20:22   Ct Head Wo Contrast  08/20/2014   CLINICAL DATA:  Fall  EXAM: CT HEAD WITHOUT CONTRAST  TECHNIQUE: Contiguous axial images were obtained from the base of the skull through the vertex without intravenous contrast.  COMPARISON:  None.  FINDINGS: Moderate atrophy. Chronic microvascular ischemic changes in the white matter. Chronic infarct right cerebellum.  Negative for acute infarct. Negative for hemorrhage or mass. No shift of the midline structures. Negative for fracture.  IMPRESSION: Atrophy and chronic ischemia.  No acute abnormality.   Electronically Signed   By: Franchot Gallo M.D.   On: 08/20/2014 20:37   Pelvis Portable  08/21/2014   CLINICAL DATA:  Postop right hip arthroplasty  EXAM: PORTABLE PELVIS 1-2 VIEWS  COMPARISON:  08/20/2014  FINDINGS: Single portable view of the pelvis submitted. There is right hip prosthesis in anatomic alignment. Lateral skin staples are noted.  IMPRESSION: Right hip prosthesis in anatomic alignment.   Electronically Signed   By: Lahoma Crocker M.D.   On: 08/21/2014 16:25    Assessment/Plan  Right femur fracture S/p right hip arthroplasty. Has f/u with orthopedics. Continue prn tylenol for pain. Family does not want narcotics for now. Will have patient work with PT/OT as tolerated to regain strength and restore function.  Fall precautions are in place. Continue coumadin for dvt prophylaxis  Insomnia On ambien, add melatonin 3 mg qhs prn for now and reassess  Leukocytosis Has elevated white count with left shift with neutrophil of 81%. .  Repeat cbc with diff in am given his low grade temperature. also send u/a and cxr to assess further. His hip incision site and wound area in buttock to be assessed for daily for signs of infection  unstageable pressure ulcer Continue pressure ulcer prophylaxis and skin care. Continue wound care for now  HTN bp stable, monitor closely, on lisinopril 2.5 mg daily  atrial fibrillation Rate controlled. Continue amiodarone 200mg  twice daily. Continue warfarin. Goal inr 2-3  Hypothyroidism continue synthroid 150mcg daily for now. Pending tsh check  BPH with urinary obstruction Continue Foley catheter and flomax.   Constipation Senna dosing adjusted yesterday, monitor clinically  Chronic chf Euvolemic exam. Continue digoxin 0.0625mg  every other day, lisinopril 2.5mg  daily, metoprolol 12.5mg  twice daily, lasix 40mg  daily in the morning and 20mg  nightly, and potassium supplement 61mEQ daily.     Family/ staff Communication: reviewed care plan with patient and nursing supervisor   Goals of care: short term rehabilitation    Labs/tests ordered: cbc with diff, u/a with  c/s, cxr    Blanchie Serve, MD  [place] [phone] (Monday-Friday 8 am - 5 pm) [phone] (afterhours)

## 2014-08-30 ENCOUNTER — Non-Acute Institutional Stay (SKILLED_NURSING_FACILITY): Payer: Medicare Other | Admitting: Registered Nurse

## 2014-08-30 ENCOUNTER — Encounter: Payer: Self-pay | Admitting: Registered Nurse

## 2014-08-30 DIAGNOSIS — Z7901 Long term (current) use of anticoagulants: Secondary | ICD-10-CM

## 2014-08-30 DIAGNOSIS — E039 Hypothyroidism, unspecified: Secondary | ICD-10-CM

## 2014-08-30 DIAGNOSIS — T83511D Infection and inflammatory reaction due to indwelling urethral catheter, subsequent encounter: Principal | ICD-10-CM

## 2014-08-30 DIAGNOSIS — N390 Urinary tract infection, site not specified: Secondary | ICD-10-CM

## 2014-08-30 DIAGNOSIS — T8351XD Infection and inflammatory reaction due to indwelling urinary catheter, subsequent encounter: Secondary | ICD-10-CM

## 2014-08-30 LAB — TSH: TSH: 9.09 u[IU]/mL — AB (ref 0.41–5.90)

## 2014-08-30 LAB — CBC AND DIFFERENTIAL
HCT: 32 % — AB (ref 41–53)
Hemoglobin: 10.3 g/dL — AB (ref 13.5–17.5)
Platelets: 256 10*3/uL (ref 150–399)
WBC: 13 10^3/mL

## 2014-08-30 LAB — BASIC METABOLIC PANEL
BUN: 46 mg/dL — AB (ref 4–21)
CREATININE: 1.5 mg/dL — AB (ref 0.6–1.3)
Glucose: 98 mg/dL
Potassium: 4.1 mmol/L (ref 3.4–5.3)
Sodium: 135 mmol/L — AB (ref 137–147)

## 2014-08-30 LAB — POCT INR: INR: 2 — AB (ref 0.9–1.1)

## 2014-08-30 NOTE — Progress Notes (Signed)
Patient ID: [name], male   DOB: 1926/03/24, 80 y.o.   MRN: [id number]   Place of Service: [facility]  Allergies  Allergen Reactions  . Ciprofloxacin Nausea And Vomiting and Other (See Comments)    Possible tendinopathy   . Ezetimibe-Simvastatin Other (See Comments)     spasms  . Rosuvastatin Other (See Comments)    Leg spasms  . Atenolol Rash    Code Status: Full Code  Goals of Care: Longevity/STR  Chief Complaint  Patient presents with  . Acute Visit    coumadin managment, UTI    HPI  80 y.o. [place] male with PMH of paroxsymal afib, chronic systolic CHF, CAD with mitral insufficiency, hypothyroidsim, right hip fracture s/p hip arthoplasty is being seen for management of warfarin therapy. Patient is on long-term warfarin anticoagulation for paroxysmal afib. Goal INR is 2 to 3. Today INR is 2.0 and current warfarin dose is 2mg  on Wed and 1mg  on all other days. No missed or extra doses in past week reported. No upcoming procedure. His urine culture came back positive for UTI with E. Coli and TSH level 9.09  Review of Systems (Interpreted by Daughter) Constitutional: Negative for fever and chills. Cardiovascular: Negative for chest pain, palpitations, and leg swelling Respiratory: Negative cough, shortness of breath, and wheezing.  Gastrointestinal: Negative for nausea and vomiting. Negative for abdominal pain. Negative for bloody stool.   Genitourinary: Negative for hematuria, dysuria. Has Foley catheter chronically for urinary retention Musculoskeletal: Negative for back pain, joint pain, and joint swelling  Neurological: Negative for dizziness and headache Skin: Negative for rash. Positive for wound on buttock and surgical wound on Right hip Psychiatric: Negative for depression  Past Medical History  Diagnosis Date  . CAD (coronary artery disease)     a. LHC 4/06 with 60% pLAD, 40% ramus, 60% mRCA, and 90% mPDA. No intervention. Inferior akinesis on  LV-gram was suggestive of possible inferior MI with recanalization.  . Hyperlipidemia     a. Myalgias with Lipitor and Crestor.  . Hypothyroidism   . Peripheral vascular disease   . Anxiety   . Hx of colonic polyps   . Paroxysmal atrial fibrillation 02/2009     a. With RVR tx with amiodarone returning to NSR 02/2009. b. Had atypical aflutter vs atrial tach 4/14, converted to NSR 4/14. In atypical flutter vs atrial tach in 1/15, DCCV 1/15 to NSR. c. Atrial tach 7/15 with junctional escape 60s.  . Systolic CHF     a.  Probable ischemic cardiomyopathy. Echo (6/10) with EF 40%, mildly dilated LV, moderate MR. ;  b.  echo 3/12: mild LVH, EF 30-35%, IL HK and Lat HK, mod to severe MR, trivial AI, mod LAE  . PNA (pneumonia)     History of   . Mitral regurgitation     a. Moderate by echo 6/10. b. 2-3+ by cath 2006; mod to severe by echo 3/12  . PVC's (premature ventricular contractions)   . Cataract   . Heart murmur   . CKD (chronic kidney disease), stage III   . Skin cancer     Hx of 2008 BCC  . Chronic indwelling Foley catheter     per family, bladder muscles no longer work  . Depression   . Myocardial infarction   . Basal cell carcinoma   . Paroxysmal atrial tachycardia     a. See details under PAF.  Marland Kitchen Paroxysmal atrial flutter     a. See details under PAF.  Marland Kitchen  BPH (benign prostatic hyperplasia)   . Recurrent UTI   . Imbalance   . Carotid stenosis     a. Dopplers 2/15 - 40-59% BICA.    Past Surgical History  Procedure Laterality Date  . Skin cancer excision  2008    shoulder and nose   . Cardioversion N/A 01/02/2013    Procedure: CARDIOVERSION;  Surgeon: Larey Dresser, MD;  Location: [place];  Service: Cardiovascular;  Laterality: N/A;  . Cardioversion N/A 10/23/2013    Procedure: CARDIOVERSION;  Surgeon: Larey Dresser, MD;  Location: [facility] ENDOSCOPY;  Service: Cardiovascular;  Laterality: N/A;  . Hip arthroplasty Right 08/21/2014    Procedure: ARTHROPLASTY BIPOLAR HIP;   Surgeon: Mcarthur Rossetti, MD;  Location: [place];  Service: Orthopedics;  Laterality: Right;    History   Social History  . Marital Status: Married    Spouse Name: N/A    Number of Children: N/A  . Years of Education: N/A   Occupational History  . Not on file.   Social History Main Topics  . Smoking status: Never Smoker   . Smokeless tobacco: Not on file  . Alcohol Use: No  . Drug Use: No  . Sexual Activity: No   Other Topics Concern  . Not on file   Social History Narrative   Retired. Originally from [place], speaks little [place]. Married. Daughter- Leota Sauers, her cell number is [phone]     Family History  Problem Relation Age of Onset  . Coronary artery disease Other     male, 1st degree relative <60   . Heart disease Mother   . Heart attack Mother       Medication List       This list is accurate as of: 08/30/14  8:45 PM.  Always use your most recent med list.               amiodarone 200 MG tablet  Commonly known as:  PACERONE  Take 1 tablet (200 mg total) by mouth 2 (two) times daily.     COQ10 PO  Take 1 tablet by mouth daily.     digoxin 0.125 MG tablet  Commonly known as:  LANOXIN  Take 0.5 tablets (0.0625 mg total) by mouth every other day.     Fish Oil 1200 MG Caps  Take 1 capsule (1,200 mg total) by mouth daily.     furosemide 40 MG tablet  Commonly known as:  LASIX  Take 1 tab in AM and 1/2 tab in PM     gabapentin 100 MG capsule  Commonly known as:  NEURONTIN  Take 1 capsule (100 mg total) by mouth 3 (three) times daily.     levothyroxine 100 MCG tablet  Commonly known as:  SYNTHROID, LEVOTHROID  Take 1 tablet (100 mcg total) by mouth daily before breakfast.     lisinopril 2.5 MG tablet  Commonly known as:  PRINIVIL,ZESTRIL  Take 1 tablet (2.5 mg total) by mouth daily.     metoprolol succinate 25 MG 24 hr tablet  Commonly known as:  TOPROL XL  Take 1 tablet (25 mg total) by mouth daily.     MILK THISTLE PO    Take 325 mg by mouth 3 (three) times daily.     multivitamin with minerals Tabs tablet  Take 1 tablet by mouth daily.     polyethylene glycol packet  Commonly known as:  MIRALAX / GLYCOLAX  Take 17 g by mouth daily as needed.  potassium chloride 10 MEQ tablet  Commonly known as:  K-DUR,KLOR-CON  Take 20 mEq by mouth daily.     pravastatin 80 MG tablet  Commonly known as:  PRAVACHOL  Take 1 tablet (80 mg total) by mouth daily.     Senna 8.6-50 MG Tabs  Take 2 tablets by mouth daily.     tamsulosin 0.4 MG Caps capsule  Commonly known as:  FLOMAX  Take 0.4 mg by mouth at bedtime.     traMADol 50 MG tablet  Commonly known as:  ULTRAM  Take one tablet by mouth every 6 hours as needed for moderate to severe pain     warfarin 1 MG tablet  Commonly known as:  COUMADIN  Take 1-2 tablets (1-2 mg total) by mouth daily. As directed     zolpidem 10 MG tablet  Commonly known as:  AMBIEN  Take one tablet by mouth every night at bedtime for rest        Physical Exam  BP 116/50 mmHg  Pulse 57  Temp(Src) 98.2 F (36.8 C)  Resp 18  Ht 5\' 2"  (1.575 m)  Wt 132 lb (59.875 kg)  BMI 24.14 kg/m2  SpO2 95%  Constitutional: thin elderly male in no acute distress. Conversant. Speaks [place] HEENT: Normocephalic and atraumatic. PERRL. EOM intact. No icterus. Oral mucosa moist.  Neck: Supple and nontender. No lymphadenopathy, masses, or thyromegaly. No JVD or carotid bruits. Cardiac: Normal S1, S2. Bradycardiac, rhythm regular. No appreciable murmurs, rubs, or gallops. Distal pulses intact. No dependent edema.  Lungs: No respiratory distress. Breath sounds clear bilaterally without rales, rhonchi, or wheezes. Abdomen: Audible bowel sounds in all quadrants. Soft, nontender, nondistended. Musculoskeletal: able to move all extremities. No joint erythema or tenderness.  Skin: Warm and dry. Right hip surgical incision looks clean with small of drainage present. No signs of infections  noted.  Neurological: Alert and oriented to person Psychiatric:  Appropriate mood and affect.   Labs Reviewed  Lab Results  Component Value Date   INR 2.0* 08/30/2014   INR 1.98* 08/24/2014   INR 1.8* 08/24/2014   CBC Latest Ref Rng 08/30/2014 08/25/2014 08/24/2014  WBC - 13.0 14.2 13.6(H)  Hemoglobin 13.5 - 17.5 g/dL 10.3(A) 10.1(A) 9.7(L)  Hematocrit 41 - 53 % 32(A) 30(A) 28.6(L)  Platelets 150 - 399 K/L 256 183 160    CMP Latest Ref Rng 08/30/2014 08/25/2014 08/24/2014  Glucose 70 - 99 mg/dL - - 107(H)  BUN 4 - 21 mg/dL 46(A) 31(A) 26(H)  Creatinine 0.6 - 1.3 mg/dL 1.5(A) 1.1 1.37(H)  Sodium 137 - 147 mmol/L 135(A) 133(A) 136(L)  Potassium 3.4 - 5.3 mmol/L 4.1 3.8 3.9  Chloride 96 - 112 mEq/L - - 98  CO2 19 - 32 mEq/L - - 26  Calcium 8.4 - 10.5 mg/dL - - 8.0(L)  Total Protein 6.0 - 8.3 g/dL - - -  Total Bilirubin 0.3 - 1.2 mg/dL - - -  Alkaline Phos 25 - 125 U/L - 61 -  AST 14 - 40 U/L - 77(A) -  ALT 10 - 40 U/L - 31 -     Assessment & Plan  1. Urinary tract infection associated with catheterization of urinary tract, subsequent encounter Positive for E. Coli. Daughter requested to have UA with C&S done but declined treatment for UTI. WBCs and neutrophils are trending down. Patient is asymptomatics. Remains afebrile over the weekend. Continue to monitor for now.   2. Hypothyroidism, unspecified hypothyroidism type TSH 9.09. Will increase levothyroxine  to 144mcg daily. Recheck TSH in 4-6 weeks  3. Anticoagulation monitoring, INR range 2-3 Therapeutic INR. Will continue coumadin 2mg  on wed and 1mg  on all other day. Continue to monitor   Recheck INR on 09/06/14  Time spent: 30 minutes on counseling and care coordination  Family/Staff Communication Plan of care discussed with family (daughter, Ronny Bacon) and nursing staff. Family (daughter, Ronny Bacon) and nursing staff verbalized understanding and agree with plan of care. No additional questions or concerns reported.     Arthur Holms, MSN, AGNP-C [facility] [address] [place], [place] [address] [phone] [8am-5pm] After hours: [phone]

## 2014-09-02 ENCOUNTER — Encounter: Payer: Self-pay | Admitting: Registered Nurse

## 2014-09-02 ENCOUNTER — Non-Acute Institutional Stay (SKILLED_NURSING_FACILITY): Payer: Medicare Other | Admitting: Registered Nurse

## 2014-09-02 DIAGNOSIS — M62838 Other muscle spasm: Secondary | ICD-10-CM

## 2014-09-02 DIAGNOSIS — T733XXA Exhaustion due to excessive exertion, initial encounter: Secondary | ICD-10-CM

## 2014-09-02 NOTE — Progress Notes (Signed)
Patient ID: [name], male   DOB: 1926-07-04, 80 y.o.   MRN: [id number]   Place of Service: [facility]  Allergies  Allergen Reactions  . Ciprofloxacin Nausea And Vomiting and Other (See Comments)    Possible tendinopathy   . Ezetimibe-Simvastatin Other (See Comments)     spasms  . Rosuvastatin Other (See Comments)    Leg spasms  . Atenolol Rash    Code Status: Full Code  Goals of Care: Longevity/STR  Chief Complaint  Patient presents with  . Acute Visit    muscle spasms    HPI  80 y.o. [place] male with PMH of paroxsymal afib, chronic systolic CHF, CAD with mitral insufficiency, hypothyroidsim, right hip fracture s/p hip arthoplasty is being seen for and acute visit for muscle spasm or right lower extremity. Son-in-law at bedside for interpretation. Per son-in-law, patient had tylenol for the spasms and it helped a little bit. However, would like to have something for spasms just in case he really needs it.   Review of Systems (Interpreted by son-in-law) Constitutional: Negative for fever and chills. Positive for fatigue after having BM Cardiovascular: Negative for chest pain, palpitations, and leg swelling Respiratory: Negative cough, shortness of breath, and wheezing.  Gastrointestinal: Negative for nausea and vomiting. Negative for abdominal pain. Has large BM today Genitourinary: Negative for hematuria, dysuria. Has Foley catheter chronically for urinary retention Musculoskeletal: Negative for back pain, joint pain, and joint swelling  Neurological:  Negative for headache and dizziness Skin: Negative for rash. Positive for wound on buttock and surgical wound on Right hip Psychiatric: Negative for depression  Past Medical History  Diagnosis Date  . CAD (coronary artery disease)     a. LHC 4/06 with 60% pLAD, 40% ramus, 60% mRCA, and 90% mPDA. No intervention. Inferior akinesis on LV-gram was suggestive of possible inferior MI with recanalization.  .  Hyperlipidemia     a. Myalgias with Lipitor and Crestor.  . Hypothyroidism   . Peripheral vascular disease   . Anxiety   . Hx of colonic polyps   . Paroxysmal atrial fibrillation 02/2009     a. With RVR tx with amiodarone returning to NSR 02/2009. b. Had atypical aflutter vs atrial tach 4/14, converted to NSR 4/14. In atypical flutter vs atrial tach in 1/15, DCCV 1/15 to NSR. c. Atrial tach 7/15 with junctional escape 60s.  . Systolic CHF     a.  Probable ischemic cardiomyopathy. Echo (6/10) with EF 40%, mildly dilated LV, moderate MR. ;  b.  echo 3/12: mild LVH, EF 30-35%, IL HK and Lat HK, mod to severe MR, trivial AI, mod LAE  . PNA (pneumonia)     History of   . Mitral regurgitation     a. Moderate by echo 6/10. b. 2-3+ by cath 2006; mod to severe by echo 3/12  . PVC's (premature ventricular contractions)   . Cataract   . Heart murmur   . CKD (chronic kidney disease), stage III   . Skin cancer     Hx of 2008 BCC  . Chronic indwelling Foley catheter     per family, bladder muscles no longer work  . Depression   . Myocardial infarction   . Basal cell carcinoma   . Paroxysmal atrial tachycardia     a. See details under PAF.  Marland Kitchen Paroxysmal atrial flutter     a. See details under PAF.  Marland Kitchen BPH (benign prostatic hyperplasia)   . Recurrent UTI   . Imbalance   .  Carotid stenosis     a. Dopplers 2/15 - 40-59% BICA.    Past Surgical History  Procedure Laterality Date  . Skin cancer excision  2008    shoulder and nose   . Cardioversion N/A 01/02/2013    Procedure: CARDIOVERSION;  Surgeon: Larey Dresser, MD;  Location: [place];  Service: Cardiovascular;  Laterality: N/A;  . Cardioversion N/A 10/23/2013    Procedure: CARDIOVERSION;  Surgeon: Larey Dresser, MD;  Location: [facility];  Service: Cardiovascular;  Laterality: N/A;  . Hip arthroplasty Right 08/21/2014    Procedure: ARTHROPLASTY BIPOLAR HIP;  Surgeon: Mcarthur Rossetti, MD;  Location: [place];  Service:  Orthopedics;  Laterality: Right;    History   Social History  . Marital Status: Married    Spouse Name: N/A    Number of Children: N/A  . Years of Education: N/A   Occupational History  . Not on file.   Social History Main Topics  . Smoking status: Never Smoker   . Smokeless tobacco: Not on file  . Alcohol Use: No  . Drug Use: No  . Sexual Activity: No   Other Topics Concern  . Not on file   Social History Narrative   Retired. Originally from [place], speaks little [place]. Married. Daughter- Leota Sauers, her cell number is [phone]     Family History  Problem Relation Age of Onset  . Coronary artery disease Other     male, 1st degree relative <60   . Heart disease Mother   . Heart attack Mother       Medication List       This list is accurate as of: 09/02/14  8:37 PM.  Always use your most recent med list.               amiodarone 200 MG tablet  Commonly known as:  PACERONE  Take 1 tablet (200 mg total) by mouth 2 (two) times daily.     COQ10 PO  Take 1 tablet by mouth daily.     cyclobenzaprine 5 MG tablet  Commonly known as:  FLEXERIL  Take 5 mg by mouth daily as needed for muscle spasms.     digoxin 0.125 MG tablet  Commonly known as:  LANOXIN  Take 0.5 tablets (0.0625 mg total) by mouth every other day.     Fish Oil 1200 MG Caps  Take 1 capsule (1,200 mg total) by mouth daily.     furosemide 40 MG tablet  Commonly known as:  LASIX  Take 1 tab in AM and 1/2 tab in PM     gabapentin 100 MG capsule  Commonly known as:  NEURONTIN  Take 1 capsule (100 mg total) by mouth 3 (three) times daily.     levothyroxine 100 MCG tablet  Commonly known as:  SYNTHROID, LEVOTHROID  Take 1 tablet (100 mcg total) by mouth daily before breakfast.     lisinopril 2.5 MG tablet  Commonly known as:  PRINIVIL,ZESTRIL  Take 1 tablet (2.5 mg total) by mouth daily.     metoprolol succinate 25 MG 24 hr tablet  Commonly known as:  TOPROL XL  Take 1 tablet  (25 mg total) by mouth daily.     MILK THISTLE PO  Take 325 mg by mouth 3 (three) times daily.     multivitamin with minerals Tabs tablet  Take 1 tablet by mouth daily.     polyethylene glycol packet  Commonly known as:  MIRALAX / GLYCOLAX  Take 17 g by mouth daily as needed.     potassium chloride 10 MEQ tablet  Commonly known as:  K-DUR,KLOR-CON  Take 20 mEq by mouth daily.     pravastatin 80 MG tablet  Commonly known as:  PRAVACHOL  Take 1 tablet (80 mg total) by mouth daily.     Senna 8.6-50 MG Tabs  Take 2 tablets by mouth daily.     tamsulosin 0.4 MG Caps capsule  Commonly known as:  FLOMAX  Take 0.4 mg by mouth at bedtime.     traMADol 50 MG tablet  Commonly known as:  ULTRAM  Take one tablet by mouth every 6 hours as needed for moderate to severe pain     warfarin 1 MG tablet  Commonly known as:  COUMADIN  Take 1-2 tablets (1-2 mg total) by mouth daily. As directed     zolpidem 10 MG tablet  Commonly known as:  AMBIEN  Take one tablet by mouth every night at bedtime for rest        Physical Exam  BP 90/52 mmHg  Pulse 51  Temp(Src) 98.4 F (36.9 C)  Resp 19  Ht 5\' 2"  (1.575 m)  Wt 132 lb (59.875 kg)  BMI 24.14 kg/m2  Constitutional: thin elderly male in no acute distress. Conversant. Speaks Russian HEENT: Normocephalic and atraumatic. PERRL. EOM intact. No icterus. Oral mucosa moist.  Neck: Supple and nontender. No lymphadenopathy, masses, or thyromegaly. No JVD or carotid bruits. Cardiac: Normal S1, S2. Bradycardiac, rhythm regular. No appreciable murmurs, rubs, or gallops. Distal pulses intact. No dependent edema.  Lungs: No respiratory distress. Breath sounds clear bilaterally without rales, rhonchi, or wheezes. Abdomen: Audible bowel sounds in all quadrants. Soft, nontender, nondistended. Musculoskeletal: able to move all extremities. No joint erythema or tenderness.  Skin: Warm and dry. Right hip surgical incision with dry dressing in  place Neurological: Alert and oriented to person Psychiatric:  Appropriate mood and affect.   Labs Reviewed  Lab Results  Component Value Date   INR 2.0* 08/30/2014   INR 1.98* 08/24/2014   INR 1.8* 08/24/2014   CBC Latest Ref Rng 08/30/2014 08/25/2014 08/24/2014  WBC - 13.0 14.2 13.6(H)  Hemoglobin 13.5 - 17.5 g/dL 10.3(A) 10.1(A) 9.7(L)  Hematocrit 41 - 53 % 32(A) 30(A) 28.6(L)  Platelets 150 - 399 K/L 256 183 160    CMP Latest Ref Rng 08/30/2014 08/25/2014 08/24/2014  Glucose 70 - 99 mg/dL - - 107(H)  BUN 4 - 21 mg/dL 46(A) 31(A) 26(H)  Creatinine 0.6 - 1.3 mg/dL 1.5(A) 1.1 1.37(H)  Sodium 137 - 147 mmol/L 135(A) 133(A) 136(L)  Potassium 3.4 - 5.3 mmol/L 4.1 3.8 3.9  Chloride 96 - 112 mEq/L - - 98  CO2 19 - 32 mEq/L - - 26  Calcium 8.4 - 10.5 mg/dL - - 8.0(L)  Total Protein 6.0 - 8.3 g/dL - - -  Total Bilirubin 0.3 - 1.2 mg/dL - - -  Alkaline Phos 25 - 125 U/L - 61 -  AST 14 - 40 U/L - 77(A) -  ALT 10 - 40 U/L - 31 -   Assessment & Plan 1. Muscle spasm of right lower extremity Start flexeril 5mg  daily as needed for spasms. Continue to monitor  2. Fatigue due to exertion Most likely related to prolonged sitting on the toilet earlier. Per son-in-law, he was having a hard time on the toilet for about 30 minutes before he could finally have a BM. Continue to monitor for now. Encourage  hydration and rest.    Family/Staff Communication Plan of care discussed with family and nursing staff. Family and nursing staff verbalized understanding and agree with plan of care. No additional questions or concerns reported.    Arthur Holms, MSN, AGNP-C [facility] [address] [place], [place] [address] [phone] [8am-5pm] After hours: [phone]

## 2014-09-05 ENCOUNTER — Encounter: Payer: Self-pay | Admitting: Cardiology

## 2014-09-06 ENCOUNTER — Non-Acute Institutional Stay (SKILLED_NURSING_FACILITY): Payer: Medicare Other | Admitting: Registered Nurse

## 2014-09-06 ENCOUNTER — Encounter: Payer: Self-pay | Admitting: Registered Nurse

## 2014-09-06 DIAGNOSIS — Z7901 Long term (current) use of anticoagulants: Secondary | ICD-10-CM

## 2014-09-06 DIAGNOSIS — M62838 Other muscle spasm: Secondary | ICD-10-CM

## 2014-09-06 LAB — POCT INR: INR: 2.3 — AB (ref 0.9–1.1)

## 2014-09-06 NOTE — Progress Notes (Signed)
Patient ID: [name], male   DOB: 06-22-1926, 80 y.o.   MRN: [id number]   Place of Service: [facility]  Allergies  Allergen Reactions  . Ciprofloxacin Nausea And Vomiting and Other (See Comments)    Possible tendinopathy   . Ezetimibe-Simvastatin Other (See Comments)     spasms  . Rosuvastatin Other (See Comments)    Leg spasms  . Atenolol Rash    Code Status: Full Code  Goals of Care: Longevity/STR  Chief Complaint  Patient presents with  . Acute Visit    coumadin management     HPI  80 y.o. [place] male with PMH of paroxsymal afib, chronic systolic CHF, CAD with mitral insufficiency, hypothyroidsim, right hip fracture s/p hip arthoplasty is being seen for management of warfarin therapy. Patient is on long-term warfarin anticoagulation for paroxysmal afib. Goal INR is 2 to 3. Today INR 2.3 and current warfarin dose is 2mg  on Wed and 1mg  on all other days. No missed or extra doses in past week reported. No upcoming procedure. No complaints verbalized by patient  Review of Systems (Interpreted by PT) Constitutional: Negative for fever and chills. Cardiovascular: Negative for chest pain, palpitations, and leg swelling Respiratory: Negative cough, shortness of breath, and wheezing.  Gastrointestinal: Negative for nausea and vomiting. Negative for abdominal pain. Negative for bloody stool.   Genitourinary: Negative for hematuria, dysuria. Has Foley catheter chronically for urinary retention Musculoskeletal: Negative for back pain, joint pain, and joint swelling  Neurological: Negative for dizziness and headache Skin: Negative for rash. Positive for wound on buttock and surgical wound on Right hip Psychiatric: Negative for depression  Past Medical History  Diagnosis Date  . CAD (coronary artery disease)     a. LHC 4/06 with 60% pLAD, 40% ramus, 60% mRCA, and 90% mPDA. No intervention. Inferior akinesis on LV-gram was suggestive of possible inferior MI with  recanalization.  . Hyperlipidemia     a. Myalgias with Lipitor and Crestor.  . Hypothyroidism   . Peripheral vascular disease   . Anxiety   . Hx of colonic polyps   . Paroxysmal atrial fibrillation 02/2009     a. With RVR tx with amiodarone returning to NSR 02/2009. b. Had atypical aflutter vs atrial tach 4/14, converted to NSR 4/14. In atypical flutter vs atrial tach in 1/15, DCCV 1/15 to NSR. c. Atrial tach 7/15 with junctional escape 60s.  . Systolic CHF     a.  Probable ischemic cardiomyopathy. Echo (6/10) with EF 40%, mildly dilated LV, moderate MR. ;  b.  echo 3/12: mild LVH, EF 30-35%, IL HK and Lat HK, mod to severe MR, trivial AI, mod LAE  . PNA (pneumonia)     History of   . Mitral regurgitation     a. Moderate by echo 6/10. b. 2-3+ by cath 2006; mod to severe by echo 3/12  . PVC's (premature ventricular contractions)   . Cataract   . Heart murmur   . CKD (chronic kidney disease), stage III   . Skin cancer     Hx of 2008 BCC  . Chronic indwelling Foley catheter     per family, bladder muscles no longer work  . Depression   . Myocardial infarction   . Basal cell carcinoma   . Paroxysmal atrial tachycardia     a. See details under PAF.  Marland Kitchen Paroxysmal atrial flutter     a. See details under PAF.  Marland Kitchen BPH (benign prostatic hyperplasia)   . Recurrent UTI   .  Imbalance   . Carotid stenosis     a. Dopplers 2/15 - 40-59% BICA.    Past Surgical History  Procedure Laterality Date  . Skin cancer excision  2008    shoulder and nose   . Cardioversion N/A 01/02/2013    Procedure: CARDIOVERSION;  Surgeon: Larey Dresser, MD;  Location: [place];  Service: Cardiovascular;  Laterality: N/A;  . Cardioversion N/A 10/23/2013    Procedure: CARDIOVERSION;  Surgeon: Larey Dresser, MD;  Location: [facility];  Service: Cardiovascular;  Laterality: N/A;  . Hip arthroplasty Right 08/21/2014    Procedure: ARTHROPLASTY BIPOLAR HIP;  Surgeon: Mcarthur Rossetti, MD;  Location: [place];   Service: Orthopedics;  Laterality: Right;    History   Social History  . Marital Status: Married    Spouse Name: N/A    Number of Children: N/A  . Years of Education: N/A   Occupational History  . Not on file.   Social History Main Topics  . Smoking status: Never Smoker   . Smokeless tobacco: Not on file  . Alcohol Use: No  . Drug Use: No  . Sexual Activity: No   Other Topics Concern  . Not on file   Social History Narrative   Retired. Originally from [place], speaks little [place]. Married. Daughter- Leota Sauers, her cell number is [phone]     Family History  Problem Relation Age of Onset  . Coronary artery disease Other     male, 1st degree relative <60   . Heart disease Mother   . Heart attack Mother       Medication List       This list is accurate as of: 09/06/14  2:23 PM.  Always use your most recent med list.               amiodarone 200 MG tablet  Commonly known as:  PACERONE  Take 1 tablet (200 mg total) by mouth 2 (two) times daily.     COQ10 PO  Take 1 tablet by mouth daily.     cyclobenzaprine 5 MG tablet  Commonly known as:  FLEXERIL  Take 5 mg by mouth daily as needed for muscle spasms.     digoxin 0.125 MG tablet  Commonly known as:  LANOXIN  Take 0.5 tablets (0.0625 mg total) by mouth every other day.     Fish Oil 1200 MG Caps  Take 1 capsule (1,200 mg total) by mouth daily.     furosemide 40 MG tablet  Commonly known as:  LASIX  Take 1 tab in AM and 1/2 tab in PM     gabapentin 100 MG capsule  Commonly known as:  NEURONTIN  Take 1 capsule (100 mg total) by mouth 3 (three) times daily.     levothyroxine 100 MCG tablet  Commonly known as:  SYNTHROID, LEVOTHROID  Take 1 tablet (100 mcg total) by mouth daily before breakfast.     lisinopril 2.5 MG tablet  Commonly known as:  PRINIVIL,ZESTRIL  Take 1 tablet (2.5 mg total) by mouth daily.     metoprolol succinate 25 MG 24 hr tablet  Commonly known as:  TOPROL XL  Take  1 tablet (25 mg total) by mouth daily.     MILK THISTLE PO  Take 325 mg by mouth 3 (three) times daily.     multivitamin with minerals Tabs tablet  Take 1 tablet by mouth daily.     polyethylene glycol packet  Commonly known as:  MIRALAX / GLYCOLAX  Take 17 g by mouth daily as needed.     potassium chloride 10 MEQ tablet  Commonly known as:  K-DUR,KLOR-CON  Take 20 mEq by mouth daily.     pravastatin 80 MG tablet  Commonly known as:  PRAVACHOL  Take 1 tablet (80 mg total) by mouth daily.     Senna 8.6-50 MG Tabs  Take 2 tablets by mouth daily.     tamsulosin 0.4 MG Caps capsule  Commonly known as:  FLOMAX  Take 0.4 mg by mouth at bedtime.     traMADol 50 MG tablet  Commonly known as:  ULTRAM  Take one tablet by mouth every 6 hours as needed for moderate to severe pain     warfarin 1 MG tablet  Commonly known as:  COUMADIN  Take 1-2 tablets (1-2 mg total) by mouth daily. As directed     zolpidem 10 MG tablet  Commonly known as:  AMBIEN  Take one tablet by mouth every night at bedtime for rest        Physical Exam  BP 104/45 mmHg  Pulse 51  Temp(Src) 98.1 F (36.7 C)  Resp 17  Ht 5\' 2"  (1.575 m)  Wt 132 lb (59.875 kg)  BMI 24.14 kg/m2  Constitutional: thin elderly male in no acute distress. Conversant. Speaks [place] HEENT: Normocephalic and atraumatic. PERRL. EOM intact. No icterus. Oral mucosa moist.  Neck: Supple and nontender. No lymphadenopathy, masses, or thyromegaly. No JVD or carotid bruits. Cardiac: Normal S1, S2. Bradycardiac, rhythm regular. No appreciable murmurs, rubs, or gallops. Distal pulses intact. 1+ pitting edema of BLE.  Lungs: No respiratory distress. Breath sounds clear bilaterally without rales, rhonchi, or wheezes. Abdomen: Audible bowel sounds in all quadrants. Soft, nontender, nondistended. Musculoskeletal: able to move all extremities. No joint erythema or tenderness. Still has spasms of right leg. No signs of infection noted on  right hip surgical incision .  Skin: Warm and dry.  Neurological: Alert and oriented to person Psychiatric:  Appropriate mood and affect.   Labs Reviewed  Lab Results  Component Value Date   INR 2.3* 09/06/2014   INR 2.0* 08/30/2014   INR 1.98* 08/24/2014    Assessment & Plan 1. Anticoagulation monitoring, INR range 2-3 Therapeutic INR. Will continue coumadin 2mg  on wed and 1mg  on all other day. Continue to monitor  Recheck INR on 09/13/14  2. Muscle spasm of right lower extremity Continue flexeril 5mg  daily as needed. Continue to [employer] of care discussed with family (daughter, Ronny Bacon) and Engineer, civil (consulting). Family (daughter, Ronny Bacon) and nursing staff verbalized understanding and agree with plan of care. No additional questions or concerns reported.    Arthur Holms, MSN, AGNP-C [facility] [address] [place], [place] [address] [phone] [8am-5pm] After hours: [phone]

## 2014-09-07 ENCOUNTER — Telehealth: Payer: Self-pay | Admitting: Family Medicine

## 2014-09-07 NOTE — Telephone Encounter (Signed)
Patient had the flu shot at [facility] on 07/18/14

## 2014-09-09 ENCOUNTER — Non-Acute Institutional Stay (SKILLED_NURSING_FACILITY): Payer: Medicare Other | Admitting: Registered Nurse

## 2014-09-09 DIAGNOSIS — F4322 Adjustment disorder with anxiety: Secondary | ICD-10-CM

## 2014-09-09 DIAGNOSIS — R600 Localized edema: Secondary | ICD-10-CM

## 2014-09-09 NOTE — Progress Notes (Signed)
Patient ID: [name], male   DOB: 1926-07-29, 80 y.o.   MRN: [id number]   Place of Service: [facility]  Allergies  Allergen Reactions  . Ciprofloxacin Nausea And Vomiting and Other (See Comments)    Possible tendinopathy   . Ezetimibe-Simvastatin Other (See Comments)     spasms  . Rosuvastatin Other (See Comments)    Leg spasms  . Atenolol Rash    Code Status: Full Code  Goals of Care: Longevity/STR  Chief Complaint  Patient presents with  . Acute Visit    anxiety/panick attack and swelling of RLE    HPI  80 y.o. [place] male with PMH of paroxsymal afib, chronic systolic CHF, CAD with mitral insufficiency, hypothyroidsim, right hip fracture s/p hip arthoplasty is being seen for an acute visit at the request of son-in-law for RLE swelling and anxiety. Family at bedside for interpretation. Per PT/OT and family, patient gets anxious and subsequently becomes reluctant to do therapy . Son-in-law reported the patient has always been anxious all his life and wants to know if something could be done to reduce his anxiety level so he can maximize his capacity in rehab. Also reported patient has not rested well since admission to the facility despite taking Ambien 10mg  nightly. He was recent seen for muscles spasms of RLE three  days ago. No significant swelling of BLE noted. However, PT/OT is concerned that the patient may have DVT of RLE. No other concerns from staff. No others concerns from patient and family.   Review of Systems (Interpreted by son-in-law) Constitutional: Negative for fever and chills. Cardiovascular: Negative for chest pain, palpitations. Postive for swelling of RLE. Respiratory: Negative cough, shortness of breath, and wheezing.  Gastrointestinal: Negative for nausea and vomiting. Negative for abdominal pain.  Genitourinary: Negative for hematuria, dysuria. Has Foley catheter chronically for urinary retention Musculoskeletal: Negative for back pain,  joint pain, and joint swelling. Positive for pain and spasm of RLE Neurological: Negative for dizziness and headache Skin: Negative for rash. Positive for wound on buttock and Right hip surgical wound Psychiatric: Negative for depression  Past Medical History  Diagnosis Date  . CAD (coronary artery disease)     a. LHC 4/06 with 60% pLAD, 40% ramus, 60% mRCA, and 90% mPDA. No intervention. Inferior akinesis on LV-gram was suggestive of possible inferior MI with recanalization.  . Hyperlipidemia     a. Myalgias with Lipitor and Crestor.  . Hypothyroidism   . Peripheral vascular disease   . Anxiety   . Hx of colonic polyps   . Paroxysmal atrial fibrillation 02/2009     a. With RVR tx with amiodarone returning to NSR 02/2009. b. Had atypical aflutter vs atrial tach 4/14, converted to NSR 4/14. In atypical flutter vs atrial tach in 1/15, DCCV 1/15 to NSR. c. Atrial tach 7/15 with junctional escape 60s.  . Systolic CHF     a.  Probable ischemic cardiomyopathy. Echo (6/10) with EF 40%, mildly dilated LV, moderate MR. ;  b.  echo 3/12: mild LVH, EF 30-35%, IL HK and Lat HK, mod to severe MR, trivial AI, mod LAE  . PNA (pneumonia)     History of   . Mitral regurgitation     a. Moderate by echo 6/10. b. 2-3+ by cath 2006; mod to severe by echo 3/12  . PVC's (premature ventricular contractions)   . Cataract   . Heart murmur   . CKD (chronic kidney disease), stage III   . Skin cancer  Hx of 2008 BCC  . Chronic indwelling Foley catheter     per family, bladder muscles no longer work  . Depression   . Myocardial infarction   . Basal cell carcinoma   . Paroxysmal atrial tachycardia     a. See details under PAF.  Marland Kitchen Paroxysmal atrial flutter     a. See details under PAF.  Marland Kitchen BPH (benign prostatic hyperplasia)   . Recurrent UTI   . Imbalance   . Carotid stenosis     a. Dopplers 2/15 - 40-59% BICA.    Past Surgical History  Procedure Laterality Date  . Skin cancer excision  2008     shoulder and nose   . Cardioversion N/A 01/02/2013    Procedure: CARDIOVERSION;  Surgeon: Larey Dresser, MD;  Location: [place];  Service: Cardiovascular;  Laterality: N/A;  . Cardioversion N/A 10/23/2013    Procedure: CARDIOVERSION;  Surgeon: Larey Dresser, MD;  Location: [facility];  Service: Cardiovascular;  Laterality: N/A;  . Hip arthroplasty Right 08/21/2014    Procedure: ARTHROPLASTY BIPOLAR HIP;  Surgeon: Mcarthur Rossetti, MD;  Location: [place];  Service: Orthopedics;  Laterality: Right;    History   Social History  . Marital Status: Married    Spouse Name: N/A    Number of Children: N/A  . Years of Education: N/A   Occupational History  . Not on file.   Social History Main Topics  . Smoking status: Never Smoker   . Smokeless tobacco: Not on file  . Alcohol Use: No  . Drug Use: No  . Sexual Activity: No   Other Topics Concern  . Not on file   Social History Narrative   Retired. Originally from [place], speaks little [place]. Married. Daughter- Leota Sauers, her cell number is [phone]     Family History  Problem Relation Age of Onset  . Coronary artery disease Other     male, 1st degree relative <60   . Heart disease Mother   . Heart attack Mother       Medication List       This list is accurate as of: 09/09/14  5:28 PM.  Always use your most recent med list.               amiodarone 200 MG tablet  Commonly known as:  PACERONE  Take 1 tablet (200 mg total) by mouth 2 (two) times daily.     COQ10 PO  Take 1 tablet by mouth daily.     cyclobenzaprine 5 MG tablet  Commonly known as:  FLEXERIL  Take 5 mg by mouth 3 (three) times daily as needed for muscle spasms.     digoxin 0.125 MG tablet  Commonly known as:  LANOXIN  Take 0.5 tablets (0.0625 mg total) by mouth every other day.     Fish Oil 1200 MG Caps  Take 1 capsule (1,200 mg total) by mouth daily.     furosemide 40 MG tablet  Commonly known as:  LASIX  Take 1 tab in  AM and 1/2 tab in PM     gabapentin 100 MG capsule  Commonly known as:  NEURONTIN  Take 1 capsule (100 mg total) by mouth 3 (three) times daily.     levothyroxine 100 MCG tablet  Commonly known as:  SYNTHROID, LEVOTHROID  Take 1 tablet (100 mcg total) by mouth daily before breakfast.     lisinopril 2.5 MG tablet  Commonly known as:  PRINIVIL,ZESTRIL  Take  1 tablet (2.5 mg total) by mouth daily.     LORazepam 0.5 MG tablet  Commonly known as:  ATIVAN  Take 0.5 mg by mouth 2 (two) times daily as needed for anxiety or sleep.     metoprolol succinate 25 MG 24 hr tablet  Commonly known as:  TOPROL XL  Take 1 tablet (25 mg total) by mouth daily.     MILK THISTLE PO  Take 325 mg by mouth 3 (three) times daily.     multivitamin with minerals Tabs tablet  Take 1 tablet by mouth daily.     polyethylene glycol packet  Commonly known as:  MIRALAX / GLYCOLAX  Take 17 g by mouth daily as needed.     potassium chloride 10 MEQ tablet  Commonly known as:  K-DUR,KLOR-CON  Take 20 mEq by mouth daily.     pravastatin 80 MG tablet  Commonly known as:  PRAVACHOL  Take 1 tablet (80 mg total) by mouth daily.     Senna 8.6-50 MG Tabs  Take 2 tablets by mouth daily.     tamsulosin 0.4 MG Caps capsule  Commonly known as:  FLOMAX  Take 0.4 mg by mouth at bedtime.     traMADol 50 MG tablet  Commonly known as:  ULTRAM  Take one tablet by mouth every 6 hours as needed for moderate to severe pain     warfarin 1 MG tablet  Commonly known as:  COUMADIN  Take 1-2 tablets (1-2 mg total) by mouth daily. As directed     zolpidem 10 MG tablet  Commonly known as:  AMBIEN  Take one tablet by mouth every night at bedtime for rest        Physical Exam  BP 94/48 mmHg  Pulse 51  Temp(Src) 97.8 F (36.6 C)  Resp 18  Ht 5\' 2"  (1.575 m)  Wt 138 lb (62.596 kg)  BMI 25.23 kg/m2  SpO2 98%  Constitutional: thin elderly male in no acute distress. Conversant. Speaks Russian HEENT: Normocephalic  and atraumatic. PERRL. EOM intact. No icterus. Oral mucosa moist.  Neck: Supple and nontender. No lymphadenopathy, masses, or thyromegaly. No JVD or carotid bruits. Cardiac: Normal S1, S2. Bradycardiac, rhythm regular. No appreciable murmurs, rubs, or gallops. Distal pulses intact. Trace pitting edema of LLE and 1+ right ankle edema Lungs: No respiratory distress. Breath sounds diminished bilaterally without rales, rhonchi, or wheezes. Abdomen: Audible bowel sounds in all quadrants. Soft, nontender, nondistended. Musculoskeletal: able to move all extremities. RLE tender to palpation. Spider veins noted on BLE. Neurological: Alert and oriented to self Psychiatric:  Appropriate mood and affect.   Labs Reviewed  Lab Results  Component Value Date   INR 2.3* 09/06/2014   INR 2.0* 08/30/2014   INR 1.98* 08/24/2014    Assessment & Plan 1. Adjustment disorder with anxious mood Start ativan 0.25mg  twice daily as needed for anxiety and change ambien 10mg  daily at bedtime to daily at bedtime as needed. Continue to monitor for change in mood  2. Edema of right lower extremity Most likely secondary to chronic venous insufficiency versus DVT as he is on coumadin for atrial fib and his latest INR was therapeutic. Ted hose to BLE while awake and off at night . Continue to monitor  Family/Staff Communication Plan of care discussed with family and nursing staff. Family and nursing staff verbalized understanding and agree with plan of care. No additional questions or concerns reported.    Arthur Holms, MSN, AGNP-C [facility]  [place], [place] [address] [phone] [8am-5pm] After hours: [phone]

## 2014-09-10 ENCOUNTER — Other Ambulatory Visit: Payer: Self-pay | Admitting: *Deleted

## 2014-09-10 MED ORDER — LORAZEPAM 0.5 MG PO TABS: ORAL_TABLET | ORAL | Status: DC

## 2014-09-10 MED ORDER — ZOLPIDEM TARTRATE 10 MG PO TABS: ORAL_TABLET | ORAL | Status: DC

## 2014-09-10 NOTE — Telephone Encounter (Signed)
[facility] 

## 2014-09-13 ENCOUNTER — Non-Acute Institutional Stay (SKILLED_NURSING_FACILITY): Payer: Medicare Other | Admitting: Registered Nurse

## 2014-09-13 ENCOUNTER — Encounter: Payer: Self-pay | Admitting: Registered Nurse

## 2014-09-13 DIAGNOSIS — S72001D Fracture of unspecified part of neck of right femur, subsequent encounter for closed fracture with routine healing: Secondary | ICD-10-CM

## 2014-09-13 DIAGNOSIS — M62838 Other muscle spasm: Secondary | ICD-10-CM

## 2014-09-13 DIAGNOSIS — N401 Enlarged prostate with lower urinary tract symptoms: Secondary | ICD-10-CM

## 2014-09-13 DIAGNOSIS — G4700 Insomnia, unspecified: Secondary | ICD-10-CM

## 2014-09-13 DIAGNOSIS — F4322 Adjustment disorder with anxiety: Secondary | ICD-10-CM

## 2014-09-13 DIAGNOSIS — E785 Hyperlipidemia, unspecified: Secondary | ICD-10-CM

## 2014-09-13 DIAGNOSIS — N138 Other obstructive and reflux uropathy: Secondary | ICD-10-CM

## 2014-09-13 DIAGNOSIS — Z049 Encounter for examination and observation for unspecified reason: Secondary | ICD-10-CM

## 2014-09-13 DIAGNOSIS — R6889 Other general symptoms and signs: Secondary | ICD-10-CM

## 2014-09-13 DIAGNOSIS — E039 Hypothyroidism, unspecified: Secondary | ICD-10-CM

## 2014-09-13 DIAGNOSIS — K5901 Slow transit constipation: Secondary | ICD-10-CM

## 2014-09-13 DIAGNOSIS — I480 Paroxysmal atrial fibrillation: Secondary | ICD-10-CM

## 2014-09-13 DIAGNOSIS — I5022 Chronic systolic (congestive) heart failure: Secondary | ICD-10-CM

## 2014-09-13 LAB — POCT INR: INR: 3.4 — AB (ref 0.9–1.1)

## 2014-09-13 NOTE — Progress Notes (Signed)
Patient ID: [name], male   DOB: 12/03/1925, 80 y.o.   MRN: [id number]   Place of Service: [facility]  Allergies  Allergen Reactions  . Ciprofloxacin Nausea And Vomiting and Other (See Comments)    Possible tendinopathy   . Ezetimibe-Simvastatin Other (See Comments)     spasms  . Rosuvastatin Other (See Comments)    Leg spasms  . Atenolol Rash    Code Status: Full Code  Goals of Care: Longevity/STR  Chief Complaint  Patient presents with  . Discharge Note    HPI 80 y.o. male with PMH of paroxysmal afib, chronic systolic CHF, hypothyroidism, BPH with chronic urinary retention, insomnia, anxiety among others is being seen for a discharge visit. Patient was here for short-term rehabilitation post hospital admission from 08/20/14 to 08/24/14 for Right closed femur/hip fracture s/p hip arthroplasty. Patient has worked with therapy team and is ready to be discharged home with [facility] PT/OT/RN and DME (WC, 3-1). Daughter, Ronny Bacon at bedside. No complaints verbalized by patient. No concerns reported from daughter.   Review of Systems (interpreted by daughter, Ronny Bacon) Constitutional: Negative for fever, chills, and fatigue. HENT: Negative for ear pain, congestion, and sore throat Eyes: Negative for eye pain, eye discharge, and visual disturbance  Cardiovascular: Negative for chest pain and palpitations. Positive for RLE swelling Respiratory: Negative cough, shortness of breath, and wheezing.  Gastrointestinal: Negative for nausea and vomiting. Negative for abdominal pain, diarrhea and constipation.  Genitourinary: Negative for  dysuria and hematuria. Has Foley catheter Endocrine: Negative for polydipsia, polyphagia, and polyuria Musculoskeletal: Negative for back pain. Positive for RLE spasms Neurological: Negative for dizziness and headache  Skin: Negative for rash and pruritus. Positive for healing right hip surgical wound and wound on buttock area Psychiatric: Negative  for depression. Positive for anxiety  Past Medical History  Diagnosis Date  . CAD (coronary artery disease)     a. LHC 4/06 with 60% pLAD, 40% ramus, 60% mRCA, and 90% mPDA. No intervention. Inferior akinesis on LV-gram was suggestive of possible inferior MI with recanalization.  . Hyperlipidemia     a. Myalgias with Lipitor and Crestor.  . Hypothyroidism   . Peripheral vascular disease   . Anxiety   . Hx of colonic polyps   . Paroxysmal atrial fibrillation 02/2009     a. With RVR tx with amiodarone returning to NSR 02/2009. b. Had atypical aflutter vs atrial tach 4/14, converted to NSR 4/14. In atypical flutter vs atrial tach in 1/15, DCCV 1/15 to NSR. c. Atrial tach 7/15 with junctional escape 60s.  . Systolic CHF     a.  Probable ischemic cardiomyopathy. Echo (6/10) with EF 40%, mildly dilated LV, moderate MR. ;  b.  echo 3/12: mild LVH, EF 30-35%, IL HK and Lat HK, mod to severe MR, trivial AI, mod LAE  . PNA (pneumonia)     History of   . Mitral regurgitation     a. Moderate by echo 6/10. b. 2-3+ by cath 2006; mod to severe by echo 3/12  . PVC's (premature ventricular contractions)   . Cataract   . Heart murmur   . CKD (chronic kidney disease), stage III   . Skin cancer     Hx of 2008 BCC  . Chronic indwelling Foley catheter     per family, bladder muscles no longer work  . Depression   . Myocardial infarction   . Basal cell carcinoma   . Paroxysmal atrial tachycardia     a.  See details under PAF.  Marland Kitchen Paroxysmal atrial flutter     a. See details under PAF.  Marland Kitchen BPH (benign prostatic hyperplasia)   . Recurrent UTI   . Imbalance   . Carotid stenosis     a. Dopplers 2/15 - 40-59% BICA.    Past Surgical History  Procedure Laterality Date  . Skin cancer excision  2008    shoulder and nose   . Cardioversion N/A 01/02/2013    Procedure: CARDIOVERSION;  Surgeon: Larey Dresser, MD;  Location: [place];  Service: Cardiovascular;  Laterality: N/A;  . Cardioversion N/A  10/23/2013    Procedure: CARDIOVERSION;  Surgeon: Larey Dresser, MD;  Location: [facility] ENDOSCOPY;  Service: Cardiovascular;  Laterality: N/A;  . Hip arthroplasty Right 08/21/2014    Procedure: ARTHROPLASTY BIPOLAR HIP;  Surgeon: Mcarthur Rossetti, MD;  Location: [place];  Service: Orthopedics;  Laterality: Right;    History   Social History  . Marital Status: Married    Spouse Name: N/A    Number of Children: N/A  . Years of Education: N/A   Occupational History  . Not on file.   Social History Main Topics  . Smoking status: Never Smoker   . Smokeless tobacco: Not on file  . Alcohol Use: No  . Drug Use: No  . Sexual Activity: No   Other Topics Concern  . Not on file   Social History Narrative   Retired. Originally from [place], speaks little [place]. Married. Daughter- Leota Sauers, her cell number is [phone]     Family History  Problem Relation Age of Onset  . Coronary artery disease Other     male, 1st degree relative <60   . Heart disease Mother   . Heart attack Mother       Medication List       This list is accurate as of: 09/13/14  1:05 PM.  Always use your most recent med list.               amiodarone 200 MG tablet  Commonly known as:  PACERONE  Take 1 tablet (200 mg total) by mouth 2 (two) times daily.     COQ10 PO  Take 1 tablet by mouth daily.     cyclobenzaprine 5 MG tablet  Commonly known as:  FLEXERIL  Take 5 mg by mouth 3 (three) times daily as needed for muscle spasms.     digoxin 0.125 MG tablet  Commonly known as:  LANOXIN  Take 0.5 tablets (0.0625 mg total) by mouth every other day.     Fish Oil 1200 MG Caps  Take 1 capsule (1,200 mg total) by mouth daily.     furosemide 40 MG tablet  Commonly known as:  LASIX  Take 1 tab in AM and 1/2 tab in PM     gabapentin 100 MG capsule  Commonly known as:  NEURONTIN  Take 1 capsule (100 mg total) by mouth 3 (three) times daily.     levothyroxine 125 MCG tablet  Commonly known  as:  SYNTHROID, LEVOTHROID  Take 125 mcg by mouth daily before breakfast.     lisinopril 2.5 MG tablet  Commonly known as:  PRINIVIL,ZESTRIL  Take 1 tablet (2.5 mg total) by mouth daily.     LORazepam 0.5 MG tablet  Commonly known as:  ATIVAN  Take 1/2 tablet by mouth twice daily as needed for anxiety/panic attacks     metoprolol succinate 25 MG 24 hr tablet  Commonly known  as:  TOPROL XL  Take 1 tablet (25 mg total) by mouth daily.     MILK THISTLE PO  Take 325 mg by mouth 3 (three) times daily.     multivitamin with minerals Tabs tablet  Take 1 tablet by mouth daily.     polyethylene glycol packet  Commonly known as:  MIRALAX / GLYCOLAX  Take 17 g by mouth daily as needed.     potassium chloride 10 MEQ tablet  Commonly known as:  K-DUR,KLOR-CON  Take 20 mEq by mouth daily.     pravastatin 80 MG tablet  Commonly known as:  PRAVACHOL  Take 1 tablet (80 mg total) by mouth daily.     Senna 8.6-50 MG Tabs  Take 2 tablets by mouth daily.     tamsulosin 0.4 MG Caps capsule  Commonly known as:  FLOMAX  Take 0.4 mg by mouth at bedtime.     traMADol 50 MG tablet  Commonly known as:  ULTRAM  Take one tablet by mouth every 6 hours as needed for moderate to severe pain     warfarin 1 MG tablet  Commonly known as:  COUMADIN  Take 1 mg by mouth daily.     zolpidem 10 MG tablet  Commonly known as:  AMBIEN  Take one tablet by mouth every night at bedtime as needed for rest        Physical Exam  BP 125/63 mmHg  Pulse 50  Temp(Src) 97.5 F (36.4 C)  Resp 17  Ht 5\' 2"  (1.575 m)  Wt 138 lb (62.596 kg)  BMI 25.23 kg/m2  SpO2 99%  Constitutional: thin elderly male in no acute distress. Pleasant. Speaks Russian  HEENT: Normocephalic and atraumatic. PERRL. EOM intact. No icterus. Oral mucosa moist.  Neck: Supple and nontender. No lymphadenopathy, masses, or thyromegaly. No JVD or carotid bruits.  Cardiac: Normal S1, S2. Bradycardiac, rhythm regular. No appreciable  murmurs, rubs, or gallops. Distal pulses intact. Trace pitting edema of LLE and 1+ right ankle edema  Lungs: No respiratory distress. Breath sounds diminished bilaterally without rales, rhonchi, or wheezes.  Abdomen: Audible bowel sounds in all quadrants. Soft, nontender, nondistended.  Musculoskeletal: able to move all extremities. RLE tender to palpation. Spider veins noted on BLE.  Neurological: Alert and oriented to self  Skin: Warm and dry. No pruritus or diaphoresis. 4x3.5cm SDTI to sacral area, 1.2x2cm SDTI to right heel, 2x2cm SDTI to left heel, 5x2x0.2cm blister to right buttock, and healing surgical incision to Right hip. No signs of infection noted on exam Psychiatric: Appropriate mood and affect.   Labs Reviewed  CBC Latest Ref Rng 08/30/2014 08/25/2014 08/24/2014  WBC - 13.0 14.2 13.6(H)  Hemoglobin 13.5 - 17.5 g/dL 10.3(A) 10.1(A) 9.7(L)  Hematocrit 41 - 53 % 32(A) 30(A) 28.6(L)  Platelets 150 - 399 K/L 256 183 160    CMP Latest Ref Rng 08/30/2014 08/25/2014 08/24/2014  Glucose 70 - 99 mg/dL - - 107(H)  BUN 4 - 21 mg/dL 46(A) 31(A) 26(H)  Creatinine 0.6 - 1.3 mg/dL 1.5(A) 1.1 1.37(H)  Sodium 137 - 147 mmol/L 135(A) 133(A) 136(L)  Potassium 3.4 - 5.3 mmol/L 4.1 3.8 3.9  Chloride 96 - 112 mEq/L - - 98  CO2 19 - 32 mEq/L - - 26  Calcium 8.4 - 10.5 mg/dL - - 8.0(L)  Total Protein 6.0 - 8.3 g/dL - - -  Total Bilirubin 0.3 - 1.2 mg/dL - - -  Alkaline Phos 25 - 125 U/L - 61 -  AST 14 - 40 U/L - 77(A) -  ALT 10 - 40 U/L - 31 -   Lab Results  Component Value Date   TSH 9.09* 08/30/2014   Lab Results  Component Value Date   INR 3.4* 09/13/2014   INR 2.3* 09/06/2014   INR 2.0* 08/30/2014   Lipid Panel     Component Value Date/Time   CHOL 176 01/04/2014 0942   TRIG 107.0 01/04/2014 0942   HDL 62.40 01/04/2014 0942   CHOLHDL 3 01/04/2014 0942   VLDL 21.4 01/04/2014 0942   LDLCALC 92 01/04/2014 0942   LDLDIRECT 133.2 01/12/2011 1013   LDLDIRECT 173.4 07/11/2006 0948    Assessment & Plan 1. Adjustment disorder with anxious mood Continue ativan 0.25mg  twice daily as needed for anxiety  2. Muscle spasm of right lower extremity Continue flexeril 5mg  three times as needed for muscle spasms  3. Hypothyroidism, unspecified hypothyroidism type Continue levothyroxine 155mcg daily. PCP to check TSH in 4-6 weeks.  4. Closed right hip fracture, with routine healing, subsequent encounter Continue HH PT/OT for gait/strength/balance training to restore/maximize functional capacity. Pain is well-controlled on current regimen. Continue tramadol 50mg  every six hours as needed for pain.   5. Insomnia Continue ambien 10mg  daily at bedtime as needed for sleep  6. Paroxysmal atrial fibrillation Stable. Rate-controlled. Continue pacerone 200mg  twice daily. Continue oral anticoagulation with coumadin. INR today is 3.4. Hold coumadin x 1 and resume coumadin 1mg  daily. [place] RN to monitor INR after discharge. Next INR check 09/17/14.   7. BPH with urinary obstruction Stable. Continue flomax 0.4mg  daily and Foley cath for urinary retention. Follow up with urology as indicated.   8. Slow transit constipation Continue senna 2 tabs dailly  9. Suspected deep tissue injury Bil heels: continue skin prep every other day and prevalon boots Sacrum: clean with NS, apply skin prep and cover wound with large foam dressing every other day R buttock (blister): clean with NS. Continue skin prep, xeroform gauze, and foam dressing every other day. [place] RN to continue wound management.   10. HLD (hyperlipidemia) Continue pravachol 80mg  daily   11. SYSTOLIC HEART FAILURE, CHRONIC Euvolemic on exam. Continue Lasix 40mg  daily in the morning and 20mg  daily in the evening with potassium supplement 86mEQ daily. Continue metoprolol succinate 12.5mg  twice daily and lisinopril 2.5mg  daily.    Home health services:PT/OT/RN DME required:W/C, 3-1 PCP follow-up: Dr. [name] on 09/30/14 @  1:45pm 30-day supply of prescription medications provided (#60 tramadol 50mg )  Time Spent: 35 min on care coordination and counseling  Family/Staff Communication Plan of care discussed with daughter Ronny Bacon) and nursing staff. Daughter Ronny Bacon) and nursing staff verbalized understanding and agree with plan of care. No additional questions or concerns reported.    Arthur Holms, MSN, AGNP-C [facility] [address] [place], [place] [address] [phone] [8am-5pm] After hours: [phone]

## 2014-09-14 ENCOUNTER — Telehealth: Payer: Self-pay | Admitting: *Deleted

## 2014-09-14 MED ORDER — LEVOTHYROXINE SODIUM 125 MCG PO TABS: 125.0000 ug | ORAL_TABLET | Freq: Every day | ORAL | Status: DC

## 2014-09-14 NOTE — Telephone Encounter (Signed)
[facility] will be going to the home for PT. Nurse states that pt is on long term use of Coumadin. If nursing needs to do lab work to check INR they just need an order. Looks like [employer] is managing INR.  [phone]

## 2014-09-14 NOTE — Telephone Encounter (Signed)
levothyroxine (SYNTHROID, LEVOTHROID) 125 MCG tablet Was missed when all other meds were called into pharmacy   Please call: [phone] natahsa  daughter

## 2014-09-14 NOTE — Telephone Encounter (Signed)
Pt called again. He has an appt with Dr. Carlean Jews in early JanMartin [name] ahead and sent in #[phone]

## 2014-09-17 ENCOUNTER — Other Ambulatory Visit: Payer: Self-pay | Admitting: Cardiology

## 2014-09-28 ENCOUNTER — Telehealth: Payer: Self-pay

## 2014-09-28 NOTE — Telephone Encounter (Signed)
Checked medication list and pt is on 34meq daily. Dr. Joseph Art is not the prescriber. Home health nurse advised to keep medication the same as last prescribed.

## 2014-09-28 NOTE — Telephone Encounter (Signed)
[name]  ([place] Provider) is requesting an order to change the Potassium to 10 MG per day   [phone]

## 2014-09-30 ENCOUNTER — Ambulatory Visit (INDEPENDENT_AMBULATORY_CARE_PROVIDER_SITE_OTHER): Payer: Medicare Other | Admitting: Family Medicine

## 2014-09-30 ENCOUNTER — Encounter: Payer: Self-pay | Admitting: Family Medicine

## 2014-09-30 VITALS — BP 98/60 | HR 51 | Temp 98.0°F | Resp 16

## 2014-09-30 DIAGNOSIS — L8990 Pressure ulcer of unspecified site, unspecified stage: Secondary | ICD-10-CM

## 2014-09-30 DIAGNOSIS — Z96649 Presence of unspecified artificial hip joint: Secondary | ICD-10-CM

## 2014-09-30 DIAGNOSIS — Z9660 Presence of unspecified orthopedic joint implant: Secondary | ICD-10-CM

## 2014-09-30 DIAGNOSIS — I480 Paroxysmal atrial fibrillation: Secondary | ICD-10-CM

## 2014-09-30 MED ORDER — DUODERM CGF DRESSING EX MISC: 2.0000 | CUTANEOUS | Status: DC

## 2014-09-30 NOTE — Progress Notes (Signed)
   Subjective:    Patient ID: [name], male    DOB: 06-10-1926, 81 y.o.   MRN: [id number] This chart was scribed for [name], MD by Zola Button, Medical Scribe. This patient was seen in Room 26 and the patient's care was started at 2:17 PM.   HPI HPI Comments: [name] is a 81 y.o. male who presents to the [facility] for a follow-up from short-term rehab. He spent 1 week in the hospital and had a partial right hip replacement. Patient was in rehab for 3 weeks. Patient uses a walker and has been walking around his apartment with the walker. He has been getting physical therapy at home. Patient has been eating fine. He is noted to have healing wounds on his buttocks and heels. Patient is always with either his son or a caregiver.    Review of Systems  Skin: Positive for wound.       Objective:   Physical Exam CONSTITUTIONAL: Well developed/well nourished HEAD: Normocephalic/atraumatic EYES: EOM/PERRL ENMT: Mucous membranes moist NECK: supple no meningeal signs SPINE: entire spine nontender CV: S1/S2 noted, no murmurs/rubs/gallops noted LUNGS: Lungs are clear to auscultation bilaterally, no apparent distress ABDOMEN: soft, nontender, no rebound or guarding GU: no cva tenderness NEURO: Pt is awake/alert, moves all extremitiesx4 EXTREMITIES: pulses normal SKIN: Bilateral posterior calcaneal which are healing. Abrasions on right knee.  Superficial bed sores on gluteal prominences. PSYCH: no abnormalities of mood noted  Very antalgic gait      Assessment & Plan:   This chart was scribed in my presence and reviewed by me personally.    ICD-9-CM ICD-10-CM   1. Paroxysmal atrial fibrillation 427.31 I48.0 Protime-INR  2. Decubital ulcer 707.00 L89.90 Control Gel Formula Dressing (DUODERM CGF DRESSING) MISC   707.20    3. S/P hip replacement V43.64 Z96.60      Signed, [name], MD

## 2014-10-01 ENCOUNTER — Telehealth: Payer: Self-pay

## 2014-10-01 ENCOUNTER — Encounter: Payer: Self-pay | Admitting: Cardiology

## 2014-10-01 LAB — PROTIME-INR
INR: 1.65 — ABNORMAL HIGH (ref <1.50)
Prothrombin Time: 19.5 seconds — ABNORMAL HIGH (ref 11.6–15.2)

## 2014-10-01 NOTE — Telephone Encounter (Signed)
Pt's daughter called states HH RN was in the home today INR checked 1.6 per her report.  Looked in chart INR checked by primary MD on 09/30/14 INR 1.65 and her primary MD Dr Joseph Art states INR slightly low, but appropriate given pt's unsteady gait.  Pt fell 08/20/14 fractured hip, which was repaired on 08/21/14.  Advised pt's daughter pt's MD's recommendations who saw him in office for assessment on 09/30/14 advised to remain on same dosage.  Daughter concerned states INR should be 2.0-3.0 which is our previous goal range, but given recent fall and unsteady gait INR goal range may need to be re-evaluated.  Advised I could not override DR Lauenstein's orders, but that I would forward to Dr Aundra Dubin for his recommendations.  Please advise.  Thanks.

## 2014-10-01 NOTE — Telephone Encounter (Signed)
Would aim for INR 2-2.5.

## 2014-10-04 NOTE — Telephone Encounter (Signed)
Called spoke with pt's daughter Ronny Bacon, advised of INR goal range change 2.0-2.5 per Dr Aundra Dubin, will update on anticoagulation sheet at next OV.  Pt's INR was 1.6 on Friday, she gave pt 2mg  that day, then resumed previous dosage of 1mg  daily of Coumadin.  Pt's daughter states she can bring pt into office for follow-up without difficulty and wishes to schedule his next INR in our office.  Advised to have pt continue taking 1mg  daily and scheduled INR recheck for 10/06/14 at 2:15pm.

## 2014-10-05 IMAGING — CR DG CHEST 2V
2 series · 2 of 2 positions shown · non-contrast
Comparison: March 05, 2013

CLINICAL DATA: Chest tightness

EXAM:
CHEST  2 VIEW

[PA]
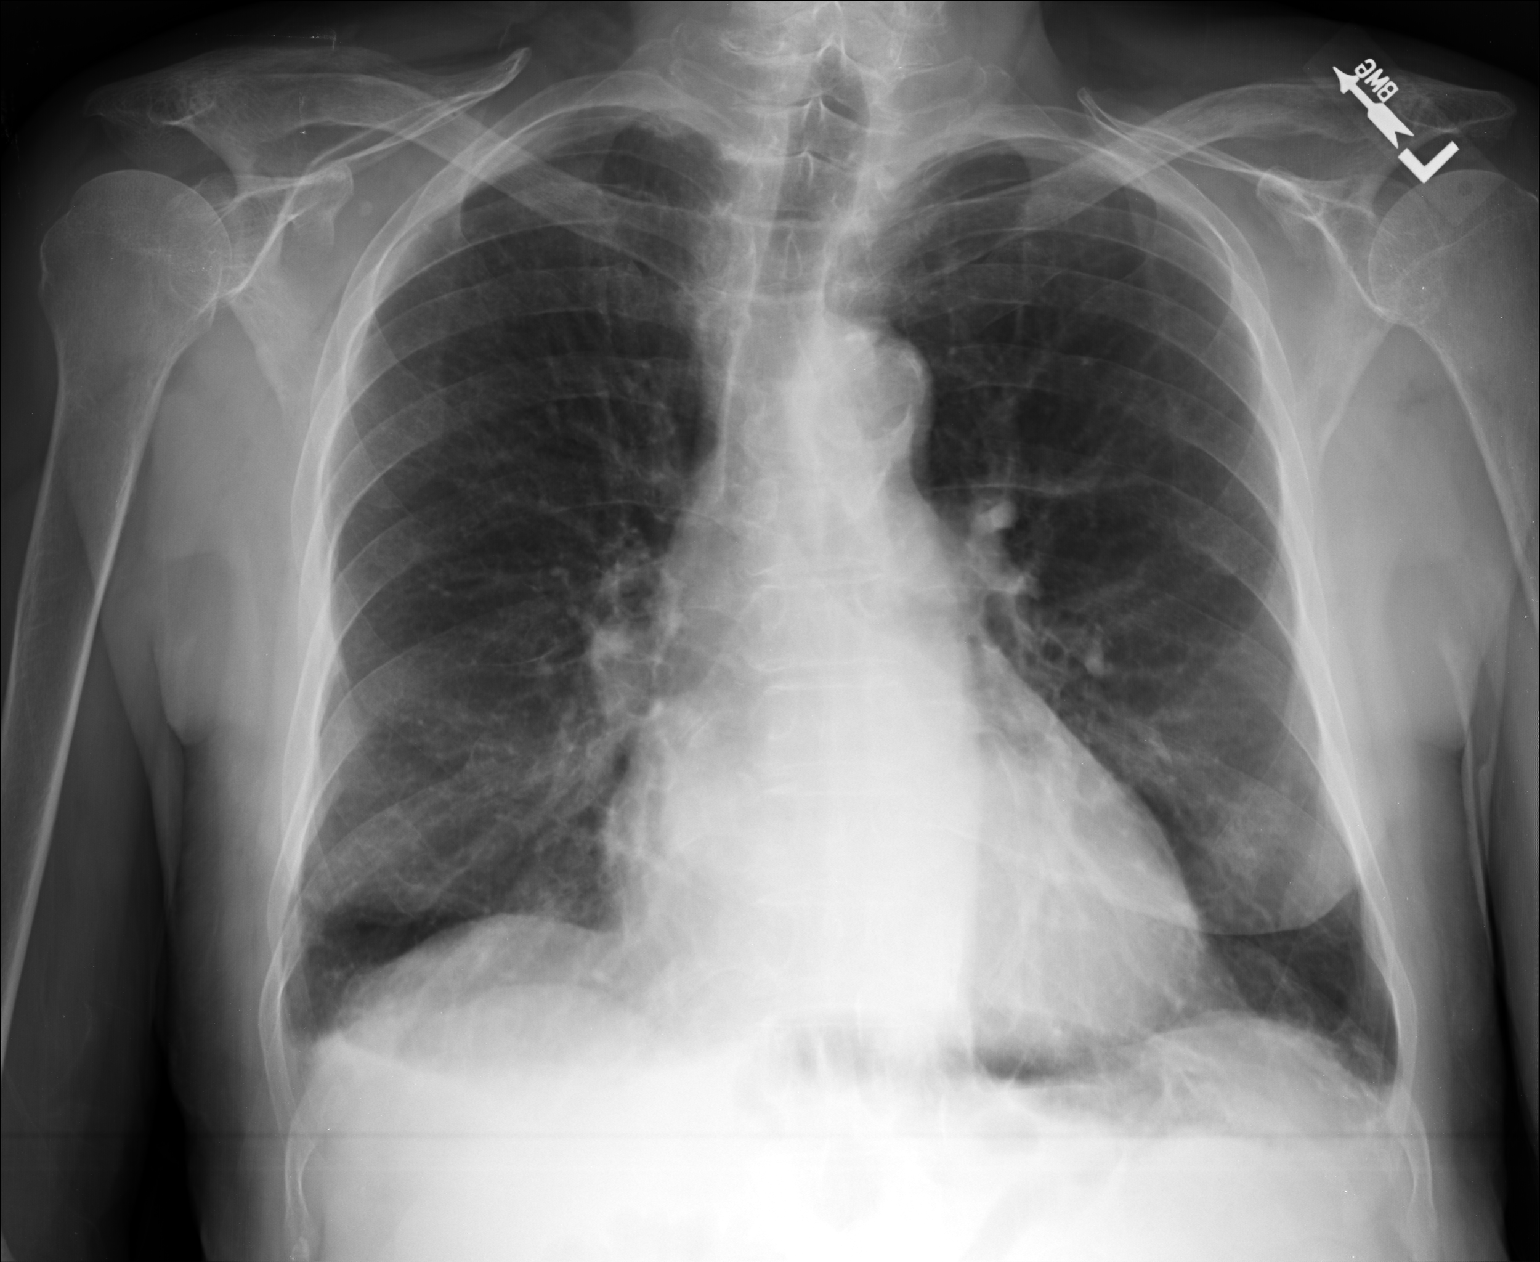

[lateral]
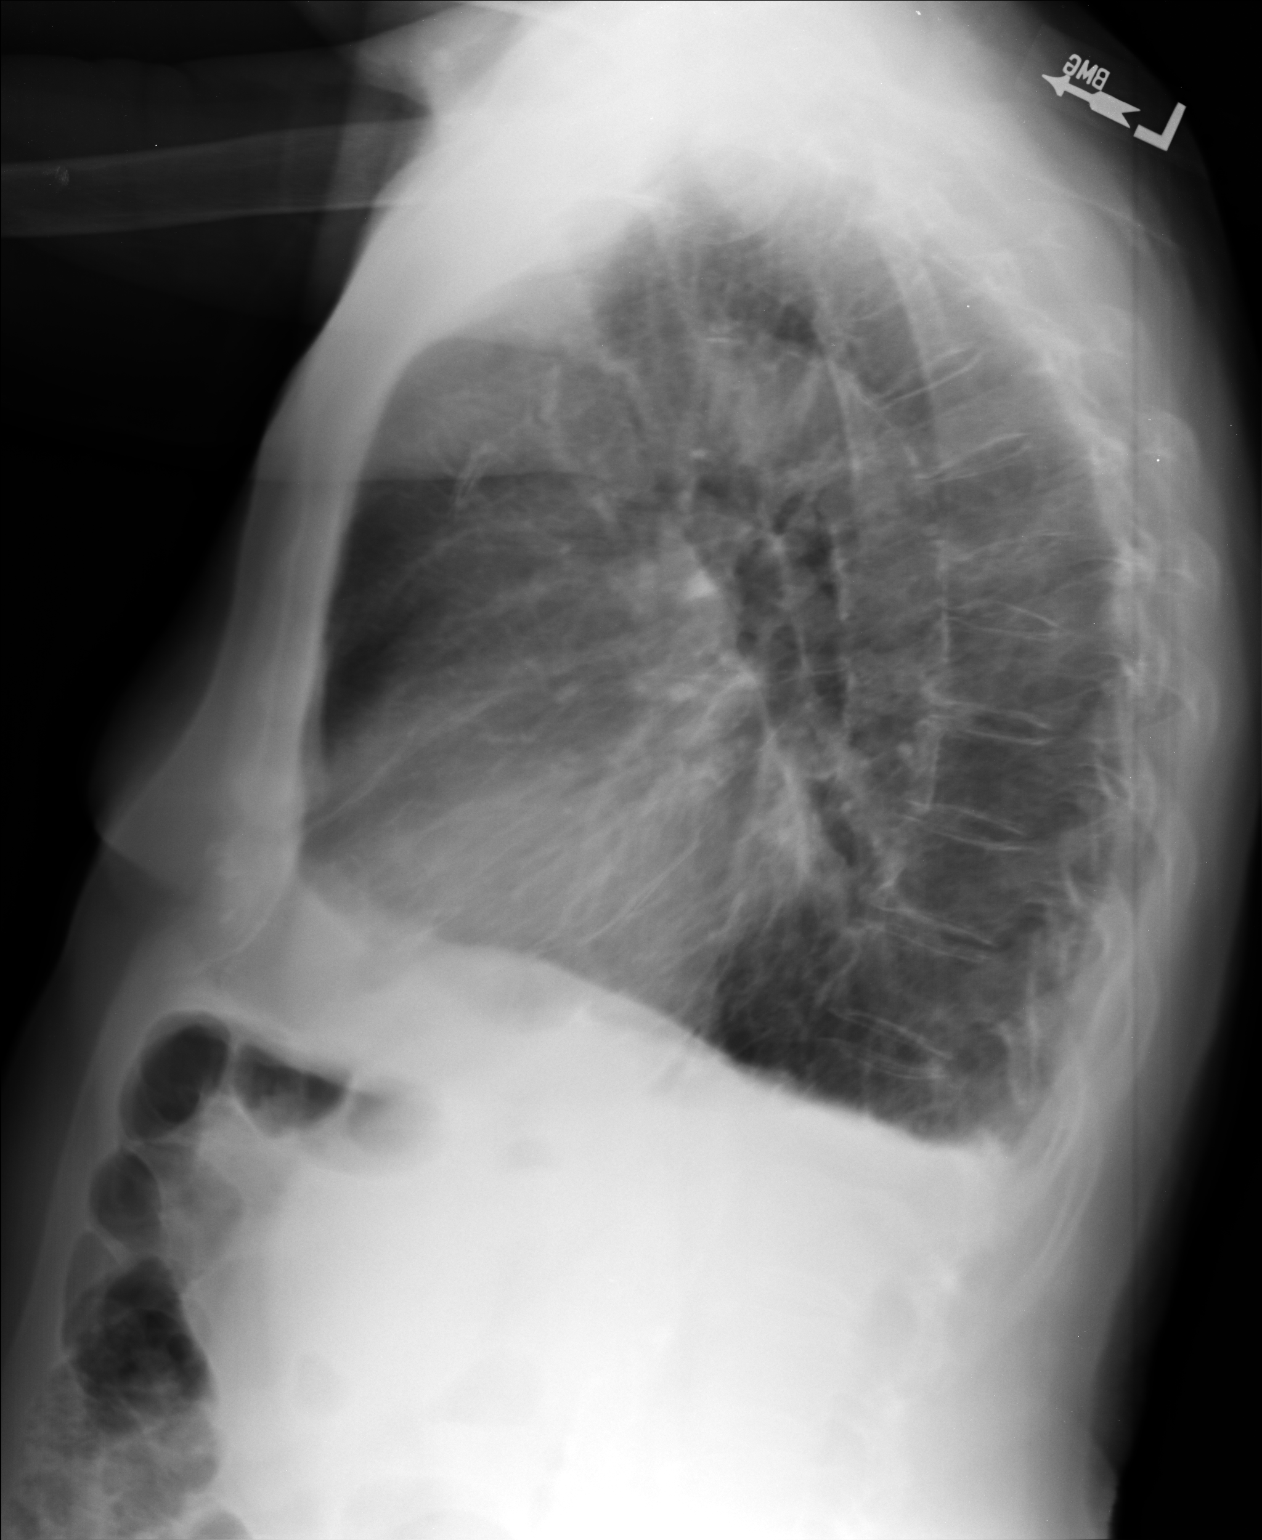

[2 of 2 positions shown; findings below may reference images not displayed]

FINDINGS: There is patchy infiltrate in the left lower lobe. Elsewhere lungs
are clear. There appears to be some underlying emphysematous change.
Heart is mildly enlarged with pulmonary vascularity within normal
limits. No adenopathy. There is atherosclerotic change in the aorta.
There is anterior wedging of a mid thoracic vertebral body.
IMPRESSION: Area of left lower lobe infiltrate. Underlying emphysematous change.
Stable mild cardiac enlargement. Stable wedging of a mid thoracic
vertebral body.

## 2014-10-06 ENCOUNTER — Ambulatory Visit (INDEPENDENT_AMBULATORY_CARE_PROVIDER_SITE_OTHER): Payer: Medicare Other | Admitting: *Deleted

## 2014-10-06 DIAGNOSIS — I4891 Unspecified atrial fibrillation: Secondary | ICD-10-CM

## 2014-10-06 DIAGNOSIS — Z5181 Encounter for therapeutic drug level monitoring: Secondary | ICD-10-CM

## 2014-10-06 LAB — POCT INR: INR: 1.7

## 2014-10-10 ENCOUNTER — Other Ambulatory Visit: Payer: Self-pay | Admitting: Family Medicine

## 2014-10-12 ENCOUNTER — Other Ambulatory Visit: Payer: Self-pay | Admitting: Cardiology

## 2014-10-13 ENCOUNTER — Ambulatory Visit (HOSPITAL_COMMUNITY)
Admission: RE | Admit: 2014-10-13 | Discharge: 2014-10-13 | Disposition: A | Discharge status: Home / Self Care | Payer: Medicare Other | Source: Ambulatory Visit | Attending: Internal Medicine | Admitting: Internal Medicine

## 2014-10-13 ENCOUNTER — Ambulatory Visit (INDEPENDENT_AMBULATORY_CARE_PROVIDER_SITE_OTHER): Payer: Medicare Other | Admitting: *Deleted

## 2014-10-13 ENCOUNTER — Encounter (HOSPITAL_COMMUNITY): Payer: Self-pay

## 2014-10-13 ENCOUNTER — Other Ambulatory Visit: Payer: Self-pay | Admitting: Cardiology

## 2014-10-13 VITALS — BP 103/45 | HR 60 | Resp 18 | Wt 131.2 lb

## 2014-10-13 DIAGNOSIS — E785 Hyperlipidemia, unspecified: Secondary | ICD-10-CM | POA: Insufficient documentation

## 2014-10-13 DIAGNOSIS — Z7901 Long term (current) use of anticoagulants: Secondary | ICD-10-CM | POA: Insufficient documentation

## 2014-10-13 DIAGNOSIS — I739 Peripheral vascular disease, unspecified: Secondary | ICD-10-CM | POA: Diagnosis not present

## 2014-10-13 DIAGNOSIS — E039 Hypothyroidism, unspecified: Secondary | ICD-10-CM | POA: Insufficient documentation

## 2014-10-13 DIAGNOSIS — N183 Chronic kidney disease, stage 3 unspecified: Secondary | ICD-10-CM

## 2014-10-13 DIAGNOSIS — I498 Other specified cardiac arrhythmias: Secondary | ICD-10-CM | POA: Insufficient documentation

## 2014-10-13 DIAGNOSIS — I4892 Unspecified atrial flutter: Secondary | ICD-10-CM

## 2014-10-13 DIAGNOSIS — I5022 Chronic systolic (congestive) heart failure: Secondary | ICD-10-CM

## 2014-10-13 DIAGNOSIS — F419 Anxiety disorder, unspecified: Secondary | ICD-10-CM | POA: Insufficient documentation

## 2014-10-13 DIAGNOSIS — Z5181 Encounter for therapeutic drug level monitoring: Secondary | ICD-10-CM

## 2014-10-13 DIAGNOSIS — I2510 Atherosclerotic heart disease of native coronary artery without angina pectoris: Secondary | ICD-10-CM | POA: Diagnosis not present

## 2014-10-13 DIAGNOSIS — I4891 Unspecified atrial fibrillation: Secondary | ICD-10-CM

## 2014-10-13 DIAGNOSIS — Z79899 Other long term (current) drug therapy: Secondary | ICD-10-CM | POA: Diagnosis not present

## 2014-10-13 DIAGNOSIS — I255 Ischemic cardiomyopathy: Secondary | ICD-10-CM | POA: Insufficient documentation

## 2014-10-13 DIAGNOSIS — I6523 Occlusion and stenosis of bilateral carotid arteries: Secondary | ICD-10-CM

## 2014-10-13 DIAGNOSIS — N189 Chronic kidney disease, unspecified: Secondary | ICD-10-CM | POA: Diagnosis not present

## 2014-10-13 LAB — COMPREHENSIVE METABOLIC PANEL
ALBUMIN: 3 g/dL — AB (ref 3.5–5.2)
ALT: 45 U/L (ref 0–53)
AST: 69 U/L — ABNORMAL HIGH (ref 0–37)
Alkaline Phosphatase: 100 U/L (ref 39–117)
Anion gap: 10 (ref 5–15)
BUN: 21 mg/dL (ref 6–23)
CO2: 26 mmol/L (ref 19–32)
CREATININE: 1.57 mg/dL — AB (ref 0.50–1.35)
Calcium: 8.8 mg/dL (ref 8.4–10.5)
Chloride: 102 mEq/L (ref 96–112)
GFR calc Af Amer: 44 mL/min — ABNORMAL LOW (ref >90)
GFR calc non Af Amer: 38 mL/min — ABNORMAL LOW (ref >90)
Glucose, Bld: 109 mg/dL — ABNORMAL HIGH (ref 70–99)
Potassium: 4.2 mmol/L (ref 3.5–5.1)
SODIUM: 138 mmol/L (ref 135–145)
Total Bilirubin: 0.7 mg/dL (ref 0.3–1.2)
Total Protein: 7 g/dL (ref 6.0–8.3)

## 2014-10-13 LAB — POCT INR: INR: 1.6

## 2014-10-13 LAB — DIGOXIN LEVEL: DIGOXIN LVL: 0.5 ng/mL — AB (ref 0.8–2.0)

## 2014-10-13 LAB — CBC
HCT: 33.2 % — ABNORMAL LOW (ref 39.0–52.0)
Hemoglobin: 11 g/dL — ABNORMAL LOW (ref 13.0–17.0)
MCH: 29.9 pg (ref 26.0–34.0)
MCHC: 33.1 g/dL (ref 30.0–36.0)
MCV: 90.2 fL (ref 78.0–100.0)
PLATELETS: 177 10*3/uL (ref 150–400)
RBC: 3.68 MIL/uL — ABNORMAL LOW (ref 4.22–5.81)
RDW: 16.7 % — AB (ref 11.5–15.5)
WBC: 6.7 10*3/uL (ref 4.0–10.5)

## 2014-10-13 MED ORDER — FUROSEMIDE 40 MG PO TABS
ORAL_TABLET | ORAL | Status: DC
Start: 2014-10-13 — End: 2014-10-13

## 2014-10-13 MED ORDER — METOPROLOL SUCCINATE ER 25 MG PO TB24: 12.5000 mg | ORAL_TABLET | Freq: Every day | ORAL | Status: DC

## 2014-10-13 NOTE — Patient Instructions (Signed)
Decrease Toprol to daily  Labs today  Your physician has requested that you have a carotid duplex. This test is an ultrasound of the carotid arteries in your neck. It looks at blood flow through these arteries that supply the brain with blood. Allow one hour for this exam. There are no restrictions or special instructions.  IN [place] AT [place]  Your physician recommends that you schedule a follow-up appointment in: 1 month

## 2014-10-14 ENCOUNTER — Telehealth (HOSPITAL_COMMUNITY): Payer: Self-pay | Admitting: Vascular Surgery

## 2014-10-14 ENCOUNTER — Encounter: Payer: Self-pay | Admitting: Family Medicine

## 2014-10-14 ENCOUNTER — Ambulatory Visit (INDEPENDENT_AMBULATORY_CARE_PROVIDER_SITE_OTHER): Payer: Medicare Other | Admitting: Family Medicine

## 2014-10-14 VITALS — BP 90/60 | HR 54 | Temp 97.6°F | Resp 16 | Ht 62.0 in | Wt 130.0 lb

## 2014-10-14 DIAGNOSIS — I5030 Unspecified diastolic (congestive) heart failure: Secondary | ICD-10-CM

## 2014-10-14 DIAGNOSIS — I6523 Occlusion and stenosis of bilateral carotid arteries: Secondary | ICD-10-CM

## 2014-10-14 DIAGNOSIS — G4700 Insomnia, unspecified: Secondary | ICD-10-CM

## 2014-10-14 MED ORDER — FUROSEMIDE 40 MG PO TABS: ORAL_TABLET | ORAL | Status: DC

## 2014-10-14 MED ORDER — FUROSEMIDE 20 MG PO TABS: 20.0000 mg | ORAL_TABLET | Freq: Every day | ORAL | Status: DC

## 2014-10-14 MED ORDER — ZOLPIDEM TARTRATE 5 MG PO TABS: 5.0000 mg | ORAL_TABLET | Freq: Every evening | ORAL | Status: DC | PRN

## 2014-10-14 NOTE — Progress Notes (Signed)
° °  Subjective:    Patient ID: [name], male    DOB: May 14, 1926, 81 y.o.   MRN: [id number] This chart was scribed for Nathan Haber, MD by Zola Button, Medical Scribe. This patient was seen in Room 24 and the patient's care was started at 3:48 PM.   HPI HPI Comments: [name] is a 81 y.o. male who presents to the [facility] for a follow-up. He speaks little [place] and has family with him to help translate. Patient still has pain in his right leg where he had surgery (right hip arthroplasty on 08/21/14). He has been favoring using the left leg when ambulating. Patient is still doing PT.   He has been seen by Dr. Casimiro Needle, who diagnosed the patient with generalized anxiety disorder; patient was prescribed Lorazepam and Lexapro.  Cardiologist says lexapro contraindicated with Amiodarone (prolonged QT interval) Patient still has difficulty sleeping even on Lorazepam. Melatonin has also not been helping with his sleep.  Past Surgical History  Procedure Laterality Date   Skin cancer excision  2008    shoulder and nose    Cardioversion N/A 01/02/2013    Procedure: CARDIOVERSION;  Surgeon: Larey Dresser, MD;  Location: [place];  Service: Cardiovascular;  Laterality: N/A;   Cardioversion N/A 10/23/2013    Procedure: CARDIOVERSION;  Surgeon: Larey Dresser, MD;  Location: [facility] ENDOSCOPY;  Service: Cardiovascular;  Laterality: N/A;   Hip arthroplasty Right 08/21/2014    Procedure: ARTHROPLASTY BIPOLAR HIP;  Surgeon: Mcarthur Rossetti, MD;  Location: [facility];  Service: Orthopedics;  Laterality: Right;     Review of Systems  Musculoskeletal: Positive for arthralgias.  Psychiatric/Behavioral: Positive for sleep disturbance.       Objective:   Physical Exam CONSTITUTIONAL: Well developed/well nourished.  Cheerful.  Seen with wife, daughter, and son in law HEAD: Normocephalic/atraumatic EYES: EOM/PERRL ENMT: Mucous membranes moist NECK: supple no  meningeal signs SPINE: entire spine nontender CV: S1/S2 noted, no rubs/gallops noted LUNGS: Lungs are clear to auscultation bilaterally, no apparent distress ABDOMEN: soft, nontender, no rebound or guarding GU: no cva tenderness NEURO: Pt is awake/alert, moves all extremitiesx4.  Gait is more stable.  Patient continues to favor left leg. EXTREMITIES: pulses normal, full ROM SKIN: warm, color normal; bedsores on heels have healed.  The sores on buttocks are closed, red and healing as well PSYCH: no abnormalities of mood noted     Assessment & Plan:   This chart was scribed in my presence and reviewed by me personally.    ICD-9-CM [id number]   1. Diastolic congestive heart failure, unspecified congestive heart failure chronicity 428.30 I50.30 furosemide (LASIX) 40 MG tablet   428.0  furosemide (LASIX) 20 MG tablet  2. Insomnia 780.52 G47.00 zolpidem (AMBIEN) 5 MG tablet   Stop melatonin Patient has seen Dr. Alain Marion in past and would like to reestablish with him as I give up my practice.  Signed, Nathan Haber, MD

## 2014-10-14 NOTE — Telephone Encounter (Signed)
Pt daughter called for lab results. Please advise

## 2014-10-14 NOTE — Telephone Encounter (Signed)
Pt's daughter aware labs stable

## 2014-10-14 NOTE — Progress Notes (Signed)
Patient ID: [name], male   DOB: April 07, 1926, 81 y.o.   MRN: [id number] PCP: Dr. Joseph Art  81 yo with history of CAD, ischemic CMP, CKD, and paroxysmal atrial fibrillation and atrial tachycardia presents for cardiology followup. He speaks little [place] and his daughter interprets.  No chest pain.  Patient has a history of myalgias with Crestor and Lipitor.  He has tolerated pravastatin without myalgias.  At a prior appointment, he was noted to be back in atypical atrial flutter versus atrial tachycardia.  It was rate-controlled and initially I thought to treat him with a rate control/anticoagulation strategy.  However, he started to notice increased exertional dyspnea.  Therefore, I took him for cardioversion back to NSR, in 1/15.  He has been on amiodarone.  Recently, he has been back in atrial tachycardia again.  At a prior appointment with the PA, he was in atrial tachycardia with VA dissociation.  Digoxin was stopped.  At 8/19 appointment with me, he remained in atrial tachycardia with VA dissociation (but occasionally atrial activity is conducted).  He had a high junctional escape with rate in 60s.  He felt fine.  Later in the month, he had a UTI and went into atrial tachycardia with RVR.  I saw him on 8/31.  Amiodarone was increased to bid and Lasix was increased due to some volume overload.  Amiodarone decreased back to 200 mg daily but at a prior appointment, he was back in atrial tachy with 2:1 block and felt bad.  I again increased amiodarone to 200 mg bid. PFTs done in 10/15 showed mild restriction, no marked abnormalities.   Since last appointment, he fell in 11/15 and fractured his right hip. This was surgically repaired. He spent some time in rehab but is now at home.  He seems to be doing well overall.  He is getting home PT.  He can walk without a walker now.  He is very anxious, but has started Ativan and this seems to help.  No exertional dyspnea walking around the house or walking  in the office today.  He has not been longer distances. No orthopnea or PND.  He is in NSR today.   ECG: NSR, iLBBB, 1st degree AV block, QTc 508    Labs (9/11): K 4.1, creatinine 1.4  Labs (8/12): K 3.8, creatinine 1.3, TSH normal, LFTs normal, LDL 96, HDL 50 Labs (1/13): K 3.9, creatinine 1.3, TSH normal, LFTs normal Labs (2/13): K 3.8, creatinine 1.3 Labs (5/13): K 3.5, creatinine 1.4, BNP 331, LFTs normal, TSH normal Labs (9/13): LDL 85, HDL 53, BNP 547, LFTs normal Labs (12/13): K 4.3, creatinine 1.49 => 1.4, digoxin 1.4, TSH normal, LFTs normal Labs (1/14): digoxin 1.0, K 4.3, creatinine 1.5 Labs (3/14): free T4 normal Labs (4/14): K 5=> 3.8, creatinine 1.8 => 1.4, digoxin 0.6 Labs (5/14): TSH normal, digoxin level normal Labs (8/14): K 4.2, creatinine 1.66, AST 42, ALT 37 Labs (9/14): K 4, creatinine 1.6, digoxin 1.3, LDL 104, HDL 51, AST 41, ALT 30 Labs (11/14): K 3.6, creatinine 1.5, BNP 126 Labs (12/14): TSH 6.93 Labs (2/15): K 3.7, creatinine 1.4, TSH normal, AST 59, ALT 56 Labs (3/15): TSH low, AST 57, ALT 30, digoxin 1.2 Labs (4/15): K 4, creatinine 1.4, LFTs normal Labs (6/15): TSH normal, digoxin level 0. Labs (8/15): K 4.3, creatinine 1.7 => 1.6 => 1.63, AST 75, ALT 70, hgb 11.8, WBCs 9.7 Labs (10/15): K 4.2, creatinine 1.75, AST 93, ALT 60 Labs (12/15): hgb 10.3, K 4.1,  creatinine 1.5, ALT 31, AST 77  Allergies (verified):  1) ! Crestor  2) Vytorin  3) Atenolol   Past Medical History:  1. Coronary artery disease: LHC 4/06 with 60% pLAD, 40% ramus, 60% mRCA, and 90% mPDA. No intervention. Inferior akinesis on LV-gram was suggestive of possible inferior MI with recanalization.  2. Hyperlipidemia: Myalgias with Lipitor and Crestor.  3. Hypothyroidism  4. Peripheral vascular disease  5. Anxiety  6. Skin cancer, hx of 2008 BCC  7. Colonic polyps, hx of  8. Atrial arrhythmias (PAF and atrial tachycardia): Paroxysmal atrial fibrillation with rapid ventricular  response treated with amiodarone therapy converting to normal sinus rhythm 02/2009.  He has also had atypical atrial flutter versus atrial tachycardia noted in 4/14, cardioverted to NSR in 4/14. In atypical flutter versus atrial tachycardia again in 1/15, DCCV in 1/15 to NSR. Atrial tachycardia in 7/15, 8/15, 9/15 with junctional escape in 60s or 2:1 block with faster rate. Off digoxin given PAT with block.  PFTs (10/15) mild restriction, no marked abnormalities.  9. Systolic CHF: Probable ischemic cardiomyopathy. Echo (6/10) with EF 40%, mildly dilated LV, moderate MR.  Echo (3/12): EF 30-35%, inferolateral severe hypokinesis, trivial AI, moderate to severe MR.  10. History of PNA  11. CKD. Baseline creatinine around 1.4.  12. Mitral regurgitation: Moderate by echo 6/10. 2-3+ by cath 2006.  Moderate-severe by echo in 3/12.  13. PVCs  14. BPH 15. Recurrent UTIs 16. Imbalance 17. Carotid stenosis: Dopplers (2/15) with 40-59% BICA stenosis.  18. Possible tendinopathy with Cipro 19. Right hip fracture with repair in 11/15.   Family History:  Family History of CAD Male 1st degree relative <60   Social History:  Retired  Originally from [place], speaks little [place]  Married  Former Smoker   ROS: All systems reviewed and negative except as per HPI.   Current Outpatient Prescriptions  Medication Sig Dispense Refill  . amiodarone (PACERONE) 200 MG tablet Take 1 tablet (200 mg total) by mouth 2 (two) times daily. 180 tablet 3  . Control Gel Formula Dressing (DUODERM CGF DRESSING) MISC Apply 2 each topically 2 (two) times a week. 12 each 0  . digoxin (LANOXIN) 0.125 MG tablet Take 0.5 tablets (0.0625 mg total) by mouth every other day. (Patient taking differently: Take 0.0625 mg by mouth every other day. Takes at noon) 15 tablet 3  . levothyroxine (SYNTHROID, LEVOTHROID) 125 MCG tablet TAKE 1 TABLET (125 MCG TOTAL) BY MOUTH DAILY BEFORE BREAKFAST. 30 tablet 0  . lisinopril (PRINIVIL,ZESTRIL)  2.5 MG tablet TAKE 1 TABLET (2.5 MG TOTAL) BY MOUTH DAILY. 90 tablet 0  . metoprolol succinate (TOPROL XL) 25 MG 24 hr tablet Take 0.5 tablets (12.5 mg total) by mouth daily. 30 tablet 6  . MILK THISTLE PO Take 325 mg by mouth 3 (three) times daily.    . Multiple Vitamin (MULTIVITAMIN WITH MINERALS) TABS tablet Take 1 tablet by mouth daily.    . Omega-3 Fatty Acids (FISH OIL) 1200 MG CAPS Take 1 capsule (1,200 mg total) by mouth daily. 90 capsule 3  . potassium chloride (K-DUR,KLOR-CON) 10 MEQ tablet Take 20 mEq by mouth daily.    . pravastatin (PRAVACHOL) 80 MG tablet Take 1 tablet (80 mg total) by mouth daily. 30 tablet 0  . Sennosides-Docusate Sodium (SENNA) 8.6-50 MG TABS Take 2 tablets by mouth daily.    . tamsulosin (FLOMAX) 0.4 MG CAPS capsule Take 0.4 mg by mouth at bedtime.     Marland Kitchen warfarin (COUMADIN) 1  MG tablet Take 1 mg by mouth daily.    Marland Kitchen zolpidem (AMBIEN) 10 MG tablet Take one tablet by mouth every night at bedtime as needed for rest 30 tablet 5  . furosemide (LASIX) 40 MG tablet Take one tab( 40 mg) in the AM and 1/2 tab (20 mg) in the PM 45 tablet 6   No current facility-administered medications for this encounter.    BP 103/45 mmHg  Pulse 60  Resp 18  Wt 131 lb 4 oz (59.535 kg)  SpO2 97% General: NAD Neck: JVP 7 cm; no thyromegaly or thyroid nodule.  Lungs: Slight crackles at bases.   CV: Nondisplaced PMI.  Heart regular, S1/S2, no S3/S4, 1/6 HSM at apex.  Trace ankle edema.  Right carotid bruit. Abdomen: Soft, nontender, no hepatosplenomegaly, no distention.  Neurologic: Alert and oriented x 3.  Psych: Normal affect. Extremities: No clubbing or cyanosis.   Assessment/Plan: 1. Atrial arrhythmias  Patient remainsin NSR.  He seems to stay in NSR more at amiodarone 200 mg bid, so I will leave him at this dose.  Recent PFTs did not suggest amiodarone lung toxicity.  Main concern at this point has been hepatic toxicity given some rise in LFTs, but recently this has been  stable.  - Keep amiodarone at 200 mg bid, check LFTs again today.   - Continue current Toprol XL and warfarin.  2. HYPERLIPIDEMIA  He is tolerating pravastatin without myalgias. Had myalgias with Lipitor and Crestor.  LDL not ideal, but will continue current pravastatin as he tolerates it.    3. MITRAL INSUFFICIENCY Last echo showed moderate to severe MR. Given age and medical condition, would plan medical management.  4. SYSTOLIC HEART FAILURE, CHRONIC  EF 30-35% on last echo. NYHA class II symptoms, improved overall.  He is euvolemic. No dyspnea though not very active.  - Continue Lasix to 40 qam/20 qpm.    - Continue current lisinopril. - I will decrease Toprol XL to once daily as BP is soft and he has occasional orthostatic symptoms.  - Continue digoxin, check level.    5. Carotid stenosis: Moderate bilateral stenosis, repeat study in 2/16.  6. CKD: Creatinine 1.5, mildly higher.  Follow for now.  7. Anxiety: Ativan has helped. Would avoid citalopram as his QTc interval is prolonged (on amiodarone).   Loralie Champagne 10/14/2014

## 2014-10-19 ENCOUNTER — Ambulatory Visit (HOSPITAL_COMMUNITY): Payer: Medicare Other | Attending: Cardiology | Admitting: *Deleted

## 2014-10-19 DIAGNOSIS — R2681 Unsteadiness on feet: Secondary | ICD-10-CM | POA: Diagnosis not present

## 2014-10-19 DIAGNOSIS — I6523 Occlusion and stenosis of bilateral carotid arteries: Secondary | ICD-10-CM | POA: Insufficient documentation

## 2014-10-19 NOTE — Progress Notes (Signed)
Carotid duplex exam performed.

## 2014-10-22 ENCOUNTER — Other Ambulatory Visit: Payer: Self-pay | Admitting: *Deleted

## 2014-10-22 ENCOUNTER — Ambulatory Visit (INDEPENDENT_AMBULATORY_CARE_PROVIDER_SITE_OTHER): Payer: Medicare Other | Admitting: *Deleted

## 2014-10-22 DIAGNOSIS — I4891 Unspecified atrial fibrillation: Secondary | ICD-10-CM

## 2014-10-22 DIAGNOSIS — Z5181 Encounter for therapeutic drug level monitoring: Secondary | ICD-10-CM

## 2014-10-22 LAB — POCT INR: INR: 1.9

## 2014-10-22 MED ORDER — WARFARIN SODIUM 1 MG PO TABS: 1.0000 mg | ORAL_TABLET | Freq: Every day | ORAL | Status: DC

## 2014-10-29 ENCOUNTER — Telehealth (HOSPITAL_COMMUNITY): Payer: Self-pay | Admitting: Vascular Surgery

## 2014-10-29 MED ORDER — POTASSIUM CHLORIDE CRYS ER 10 MEQ PO TBCR: 20.0000 meq | EXTENDED_RELEASE_TABLET | Freq: Every day | ORAL | Status: DC

## 2014-10-29 NOTE — Telephone Encounter (Signed)
Refill Klor con

## 2014-11-05 ENCOUNTER — Other Ambulatory Visit: Payer: Self-pay | Admitting: Physician Assistant

## 2014-11-05 ENCOUNTER — Ambulatory Visit (INDEPENDENT_AMBULATORY_CARE_PROVIDER_SITE_OTHER): Payer: Medicare Other | Admitting: *Deleted

## 2014-11-05 DIAGNOSIS — Z5181 Encounter for therapeutic drug level monitoring: Secondary | ICD-10-CM

## 2014-11-05 DIAGNOSIS — I4891 Unspecified atrial fibrillation: Secondary | ICD-10-CM

## 2014-11-05 LAB — POCT INR: INR: 2

## 2014-11-09 ENCOUNTER — Telehealth: Payer: Self-pay | Admitting: Physician Assistant

## 2014-11-10 ENCOUNTER — Telehealth: Payer: Self-pay | Admitting: *Deleted

## 2014-11-10 NOTE — Telephone Encounter (Signed)
Spoke with daughter Ronny Bacon, the pharmacy has both prescription for lasix 40 mg and 20 mg ready for pick up.

## 2014-11-10 NOTE — Telephone Encounter (Signed)
Patient's daughter Ronny Bacon requested that the pharmacy send a refill request for florizamida 5 days ago. Please send medication to [facility] on [address] and call Ronny Bacon when it has been sent. Her number is [phone]

## 2014-11-11 NOTE — Telephone Encounter (Signed)
Pharm already had these ready for pt.

## 2014-11-15 ENCOUNTER — Ambulatory Visit (HOSPITAL_COMMUNITY)
Admission: RE | Admit: 2014-11-15 | Discharge: 2014-11-15 | Disposition: A | Discharge status: Home / Self Care | Payer: Medicare Other | Source: Ambulatory Visit | Attending: Cardiology | Admitting: Cardiology

## 2014-11-15 VITALS — BP 110/58 | HR 63 | Wt 126.8 lb

## 2014-11-15 DIAGNOSIS — Z7901 Long term (current) use of anticoagulants: Secondary | ICD-10-CM | POA: Diagnosis not present

## 2014-11-15 DIAGNOSIS — N183 Chronic kidney disease, stage 3 unspecified: Secondary | ICD-10-CM

## 2014-11-15 DIAGNOSIS — I2510 Atherosclerotic heart disease of native coronary artery without angina pectoris: Secondary | ICD-10-CM | POA: Diagnosis not present

## 2014-11-15 DIAGNOSIS — I6529 Occlusion and stenosis of unspecified carotid artery: Secondary | ICD-10-CM | POA: Diagnosis not present

## 2014-11-15 DIAGNOSIS — I129 Hypertensive chronic kidney disease with stage 1 through stage 4 chronic kidney disease, or unspecified chronic kidney disease: Secondary | ICD-10-CM | POA: Diagnosis not present

## 2014-11-15 DIAGNOSIS — I498 Other specified cardiac arrhythmias: Secondary | ICD-10-CM | POA: Insufficient documentation

## 2014-11-15 DIAGNOSIS — Z79899 Other long term (current) drug therapy: Secondary | ICD-10-CM | POA: Diagnosis not present

## 2014-11-15 DIAGNOSIS — E785 Hyperlipidemia, unspecified: Secondary | ICD-10-CM | POA: Diagnosis not present

## 2014-11-15 DIAGNOSIS — F419 Anxiety disorder, unspecified: Secondary | ICD-10-CM | POA: Insufficient documentation

## 2014-11-15 DIAGNOSIS — I340 Nonrheumatic mitral (valve) insufficiency: Secondary | ICD-10-CM | POA: Insufficient documentation

## 2014-11-15 DIAGNOSIS — I4892 Unspecified atrial flutter: Secondary | ICD-10-CM

## 2014-11-15 DIAGNOSIS — I739 Peripheral vascular disease, unspecified: Secondary | ICD-10-CM | POA: Diagnosis not present

## 2014-11-15 DIAGNOSIS — I5022 Chronic systolic (congestive) heart failure: Secondary | ICD-10-CM

## 2014-11-15 DIAGNOSIS — E039 Hypothyroidism, unspecified: Secondary | ICD-10-CM | POA: Diagnosis not present

## 2014-11-15 DIAGNOSIS — N189 Chronic kidney disease, unspecified: Secondary | ICD-10-CM | POA: Insufficient documentation

## 2014-11-15 DIAGNOSIS — I080 Rheumatic disorders of both mitral and aortic valves: Secondary | ICD-10-CM

## 2014-11-15 NOTE — Progress Notes (Signed)
Patient ID: [name], male   DOB: [birth year], 81 y.o.   MRN: [id number] PCP: Dr. Joseph Art  81 yo with history of CAD, ischemic CMP, CKD, and paroxysmal atrial fibrillation and atrial tachycardia presents for cardiology followup. He speaks little [place] and his daughter interprets.  No chest pain.  Patient has a history of myalgias with Crestor and Lipitor.  He has tolerated pravastatin without myalgias.  At a prior appointment, he was noted to be back in atypical atrial flutter versus atrial tachycardia.  It was rate-controlled and initially I thought to treat him with a rate control/anticoagulation strategy.  However, he started to notice increased exertional dyspnea.  Therefore, I took him for cardioversion back to NSR, in 1/15.  He has been on amiodarone.  Recently, he has been back in atrial tachycardia again.  At a prior appointment with the PA, he was in atrial tachycardia with VA dissociation.  Digoxin was stopped.  At 8/19 appointment with me, he remained in atrial tachycardia with VA dissociation (but occasionally atrial activity is conducted).  He had a high junctional escape with rate in 60s.  He felt fine.  Later in the month, he had a UTI and went into atrial tachycardia with RVR.  I saw him on 8/31.  Amiodarone was increased to bid and Lasix was increased due to some volume overload.  Amiodarone decreased back to 200 mg daily but at a prior appointment, he was back in atrial tachy with 2:1 block and felt bad.  I again increased amiodarone to 200 mg bid. PFTs done in 10/15 showed mild restriction, no marked abnormalities.   He fell in 11/15 and fractured his right hip. This was surgically repaired. He spent some time in rehab but is now at home.  He seems to be doing well overall.  He has finished home PT.  He can walk without a walker now.  He is in NSR today by exam.  He tires easily but is not getting short of breath.  No further falls.  No chest pain.  No lightheadedness.    Labs  (9/11): K 4.1, creatinine 1.4  Labs (8/12): K 3.8, creatinine 1.3, TSH normal, LFTs normal, LDL 96, HDL 50 Labs (1/13): K 3.9, creatinine 1.3, TSH normal, LFTs normal Labs (2/13): K 3.8, creatinine 1.3 Labs (5/13): K 3.5, creatinine 1.4, BNP 331, LFTs normal, TSH normal Labs (9/13): LDL 85, HDL 53, BNP 547, LFTs normal Labs (12/13): K 4.3, creatinine 1.49 => 1.4, digoxin 1.4, TSH normal, LFTs normal Labs (1/14): digoxin 1.0, K 4.3, creatinine 1.5 Labs (3/14): free T4 normal Labs (4/14): K 5=> 3.8, creatinine 1.8 => 1.4, digoxin 0.6 Labs (5/14): TSH normal, digoxin level normal Labs (8/14): K 4.2, creatinine 1.66, AST 42, ALT 37 Labs (9/14): K 4, creatinine 1.6, digoxin 1.3, LDL 104, HDL 51, AST 41, ALT 30 Labs (11/14): K 3.6, creatinine 1.5, BNP 126 Labs (12/14): TSH 6.93 Labs (2/15): K 3.7, creatinine 1.4, TSH normal, AST 59, ALT 56 Labs (3/15): TSH low, AST 57, ALT 30, digoxin 1.2 Labs (4/15): K 4, creatinine 1.4, LFTs normal Labs (6/15): TSH normal, digoxin level 0. Labs (8/15): K 4.3, creatinine 1.7 => 1.6 => 1.63, AST 75, ALT 70, hgb 11.8, WBCs 9.7 Labs (10/15): K 4.2, creatinine 1.75, AST 93, ALT 60 Labs (12/15): hgb 10.3, K 4.1, creatinine 1.5, ALT 31, AST 77 Labs (1/16): K 4.2, creatinien 1.51, AST 69, ALT 45, hgb 11, digoxin 0.5  Allergies (verified):  1) ! Crestor  2)  Vytorin  3) Atenolol   Past Medical History:  1. Coronary artery disease: LHC 4/06 with 60% pLAD, 40% ramus, 60% mRCA, and 90% mPDA. No intervention. Inferior akinesis on LV-gram was suggestive of possible inferior MI with recanalization.  2. Hyperlipidemia: Myalgias with Lipitor and Crestor.  3. Hypothyroidism  4. Peripheral vascular disease  5. Anxiety  6. Skin cancer, hx of 2008 BCC  7. Colonic polyps, hx of  8. Atrial arrhythmias (PAF and atrial tachycardia): Paroxysmal atrial fibrillation with rapid ventricular response treated with amiodarone therapy converting to normal sinus rhythm 02/2009.  He has  also had atypical atrial flutter versus atrial tachycardia noted in 4/14, cardioverted to NSR in 4/14. In atypical flutter versus atrial tachycardia again in 1/15, DCCV in 1/15 to NSR. Atrial tachycardia in 7/15, 8/15, 9/15 with junctional escape in 60s or 2:1 block with faster rate. Off digoxin given PAT with block.  PFTs (10/15) mild restriction, no marked abnormalities.  9. Systolic CHF: Probable ischemic cardiomyopathy. Echo (6/10) with EF 40%, mildly dilated LV, moderate MR.  Echo (3/12): EF 30-35%, inferolateral severe hypokinesis, trivial AI, moderate to severe MR.  10. History of PNA  11. CKD. Baseline creatinine around 1.4.  12. Mitral regurgitation: Moderate by echo 6/10. 2-3+ by cath 2006.  Moderate-severe by echo in 3/12.  13. PVCs  14. BPH 15. Recurrent UTIs 16. Imbalance 17. Carotid stenosis: Dopplers (2/15) with 40-59% BICA stenosis. Carotid dopplers (1/16) with 40-59% BICA stenosis.  18. Possible tendinopathy with Cipro 19. Right hip fracture with repair in 11/15.   Family History:  Family History of CAD Male 1st degree relative <60   Social History:  Retired  Originally from [place], speaks little [place]  Married  Former Smoker   ROS: All systems reviewed and negative except as per HPI.   Current Outpatient Prescriptions  Medication Sig Dispense Refill  . amiodarone (PACERONE) 200 MG tablet Take 1 tablet (200 mg total) by mouth 2 (two) times daily. 180 tablet 3  . Control Gel Formula Dressing (DUODERM CGF DRESSING) MISC Apply 2 each topically 2 (two) times a week. 12 each 0  . digoxin (LANOXIN) 0.125 MG tablet Take 0.5 tablets (0.0625 mg total) by mouth every other day. (Patient taking differently: Take 0.0625 mg by mouth every other day. Takes at noon) 15 tablet 3  . furosemide (LASIX) 20 MG tablet Take 1 tablet (20 mg total) by mouth daily. (Patient taking differently: Take 20 mg by mouth every evening. ) 90 tablet 3  . furosemide (LASIX) 40 MG tablet Take one tab(  40 mg) in the AM 90 tablet 3  . levothyroxine (SYNTHROID, LEVOTHROID) 125 MCG tablet TAKE 1 TABLET (125 MCG TOTAL) BY MOUTH DAILY BEFORE BREAKFAST. 30 tablet 5  . lisinopril (PRINIVIL,ZESTRIL) 2.5 MG tablet TAKE 1 TABLET (2.5 MG TOTAL) BY MOUTH DAILY. 90 tablet 0  . LORazepam (ATIVAN) 0.5 MG tablet Take 0.5 mg by mouth 2 (two) times daily.    . metoprolol succinate (TOPROL XL) 25 MG 24 hr tablet Take 0.5 tablets (12.5 mg total) by mouth daily. 30 tablet 6  . MILK THISTLE PO Take 325 mg by mouth 3 (three) times daily.    . Multiple Vitamin (MULTIVITAMIN WITH MINERALS) TABS tablet Take 1 tablet by mouth daily.    . Omega-3 Fatty Acids (FISH OIL) 1200 MG CAPS Take 1 capsule (1,200 mg total) by mouth daily. 90 capsule 3  . potassium chloride (K-DUR,KLOR-CON) 10 MEQ tablet Take 2 tablets (20 mEq total) by mouth  daily. 60 tablet 6  . pravastatin (PRAVACHOL) 80 MG tablet Take 1 tablet (80 mg total) by mouth daily. 30 tablet 0  . Sennosides-Docusate Sodium (SENNA) 8.6-50 MG TABS Take 2 tablets by mouth daily.    . tamsulosin (FLOMAX) 0.4 MG CAPS capsule Take 0.4 mg by mouth at bedtime.     Marland Kitchen warfarin (COUMADIN) 1 MG tablet Take 1 tablet (1 mg total) by mouth daily. 45 tablet 3  . zolpidem (AMBIEN) 5 MG tablet Take 1 tablet (5 mg total) by mouth at bedtime as needed for sleep. 90 tablet 1   No current facility-administered medications for this encounter.    BP 110/58 mmHg  Pulse 63  Wt 126 lb 12 oz (57.493 kg)  SpO2 97% General: NAD Neck: JVP 7 cm; no thyromegaly or thyroid nodule.  Lungs: Slight crackles at bases.   CV: Nondisplaced PMI.  Heart regular, S1/S2, no S3/S4, 1/6 HSM at apex.  1+ ankle edema.  Right carotid bruit. Abdomen: Soft, nontender, no hepatosplenomegaly, no distention.  Neurologic: Alert and oriented x 3.  Psych: Normal affect. Extremities: No clubbing or cyanosis.   Assessment/Plan: 1. Atrial arrhythmias  Patient remainsin NSR.  He seems to stay in NSR more at amiodarone  200 mg bid, so I will leave him at this dose.  Recent PFTs did not suggest amiodarone lung toxicity.  Main concern at this point has been hepatic toxicity given some rise in LFTs, but recently this has been stable.  - Keep amiodarone at 200 mg bid, check LFTs at followup appt.   - Continue current Toprol XL and warfarin.  2. HYPERLIPIDEMIA  He is tolerating pravastatin without myalgias. Had myalgias with Lipitor and Crestor.  LDL not ideal, but will continue current pravastatin as he tolerates it.    3. MITRAL INSUFFICIENCY Last echo showed moderate to severe MR. Given age and medical condition, would plan medical management.  4. SYSTOLIC HEART FAILURE, CHRONIC  EF 30-35% on last echo. NYHA class II symptoms, improved overall.  He is euvolemic. No dyspnea though not very active.  - Continue Lasix to 40 qam/20 qpm.    - Continue current lisinopril and Toprol XL.  - Continue digoxin, recent level was ok.    5. Carotid stenosis: Stable mild to moderate disease.  Repeat 1/17.  6. CKD: Stable creatinine.   He needs a new PCP as Dr Joseph Art is retiring.  He wants to see Dr. Alain Marion.  I will see if this can be arranged.   Loralie Champagne 11/15/2014

## 2014-11-15 NOTE — Patient Instructions (Signed)
You have been referred to Dr Alain Marion   Your physician recommends that you schedule a follow-up appointment in: 2 months

## 2014-11-19 ENCOUNTER — Ambulatory Visit (INDEPENDENT_AMBULATORY_CARE_PROVIDER_SITE_OTHER): Payer: Medicare Other | Admitting: Pharmacist

## 2014-11-19 DIAGNOSIS — I4891 Unspecified atrial fibrillation: Secondary | ICD-10-CM

## 2014-11-19 DIAGNOSIS — Z5181 Encounter for therapeutic drug level monitoring: Secondary | ICD-10-CM

## 2014-11-19 LAB — POCT INR: INR: 2.3

## 2014-11-19 MED ORDER — WARFARIN SODIUM 1 MG PO TABS: 1.0000 mg | ORAL_TABLET | Freq: Every day | ORAL | Status: DC

## 2014-12-03 ENCOUNTER — Ambulatory Visit (INDEPENDENT_AMBULATORY_CARE_PROVIDER_SITE_OTHER): Payer: Medicare Other

## 2014-12-03 DIAGNOSIS — Z5181 Encounter for therapeutic drug level monitoring: Secondary | ICD-10-CM | POA: Diagnosis not present

## 2014-12-03 DIAGNOSIS — I4891 Unspecified atrial fibrillation: Secondary | ICD-10-CM

## 2014-12-03 LAB — POCT INR: INR: 1.9

## 2014-12-06 ENCOUNTER — Telehealth: Payer: Self-pay | Admitting: Pharmacist

## 2014-12-06 ENCOUNTER — Encounter: Payer: Self-pay | Admitting: Cardiology

## 2014-12-06 NOTE — Telephone Encounter (Signed)
New message      Talk to [facility]

## 2014-12-06 NOTE — Telephone Encounter (Signed)
Spoke with daughter about email to Dr Aundra Dubin about changing pts INR range. Per Email, Dr Aundra Dubin increased range to 2.0-3.0.

## 2014-12-07 IMAGING — US US RENAL
1 series · 14 of 25 positions shown · non-contrast
Comparison: None.

CLINICAL DATA: Renal cyst

EXAM:
RENAL/URINARY TRACT ULTRASOUND COMPLETE

[Series 1: us renal · 0.28mm/px · 14 of 41 slices shown]
[im 1/41]
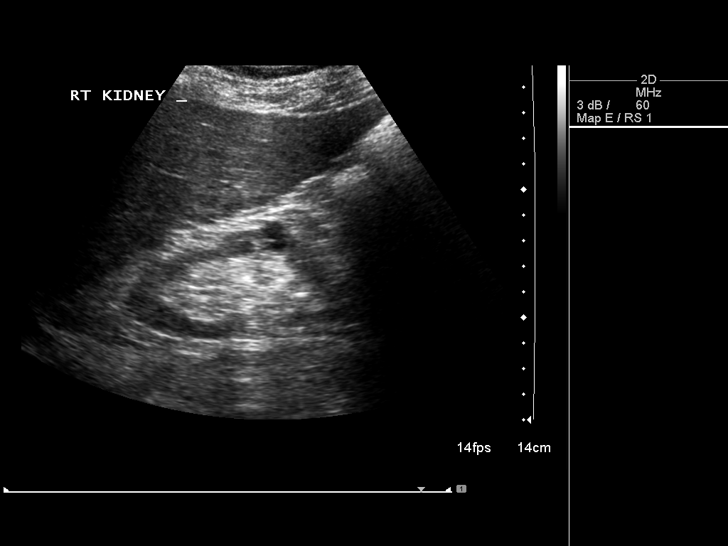
[im 4/41]
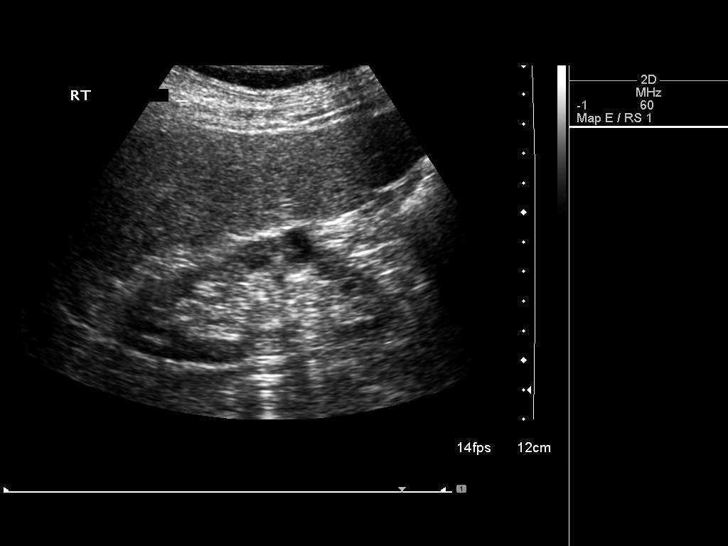
[im 7/41]
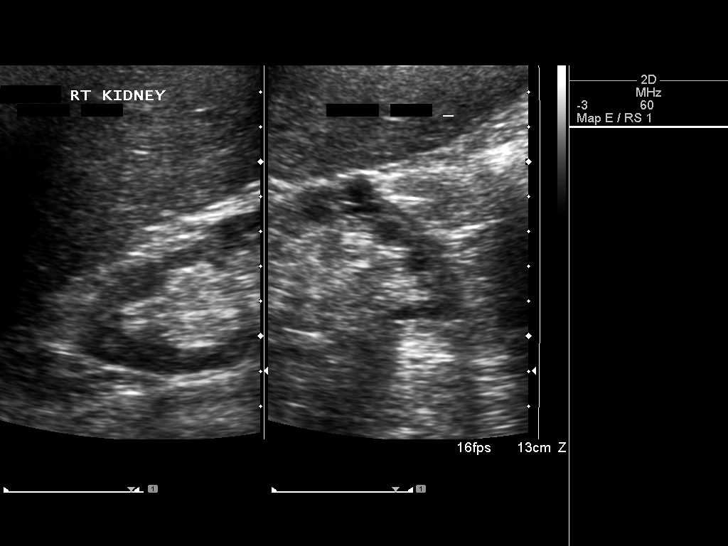
[im 11/41]
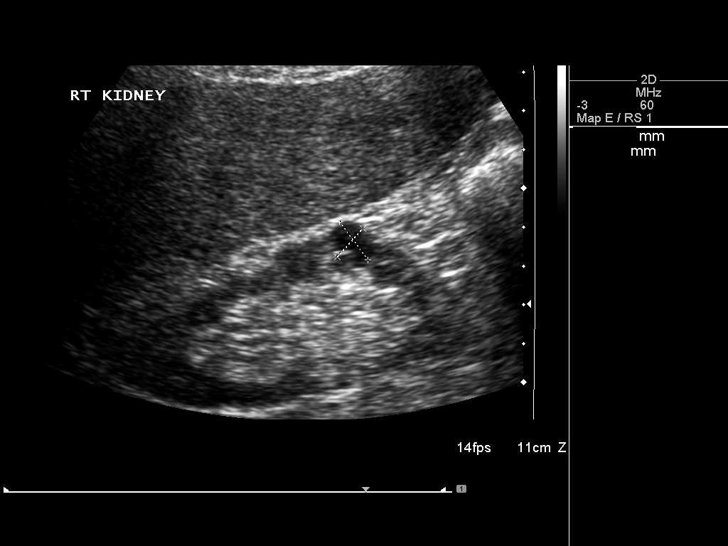
[im 14/41]
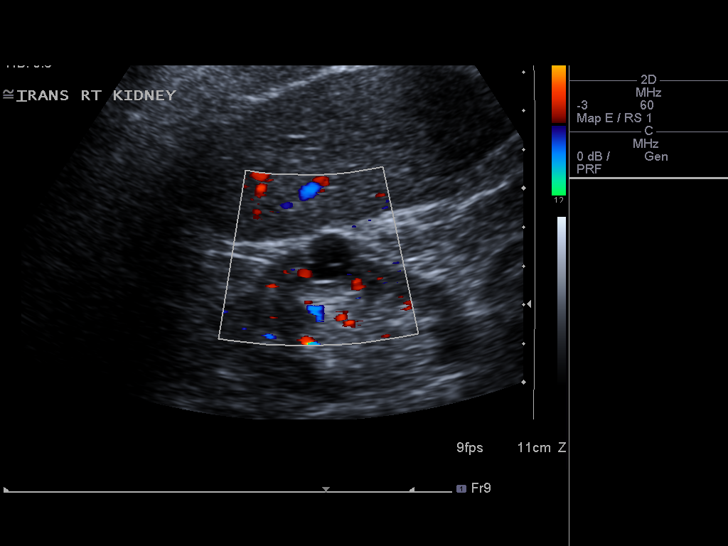
[im 16/41]
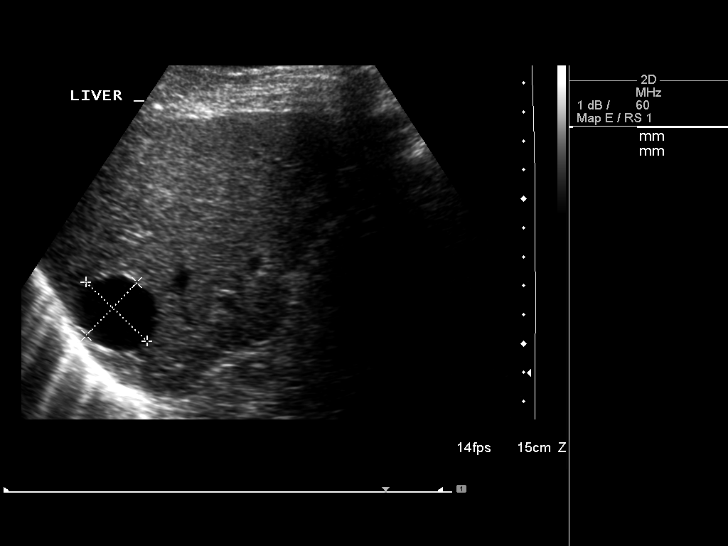
[im 19/41]
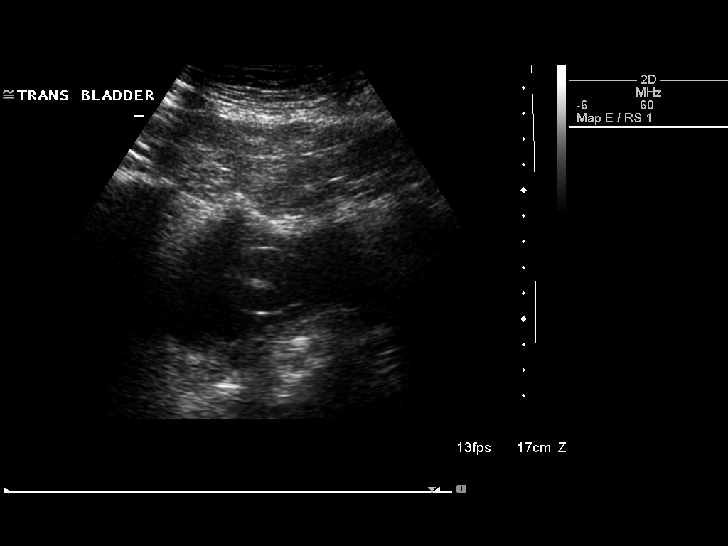
[im 22/41]
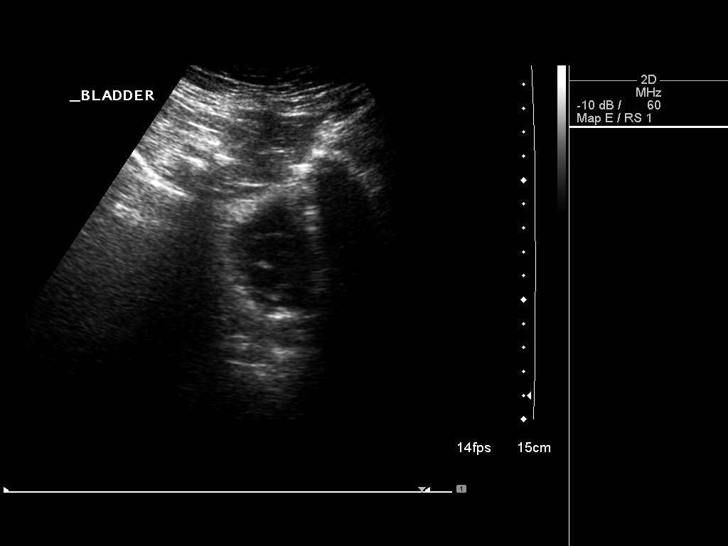
[im 26/41]
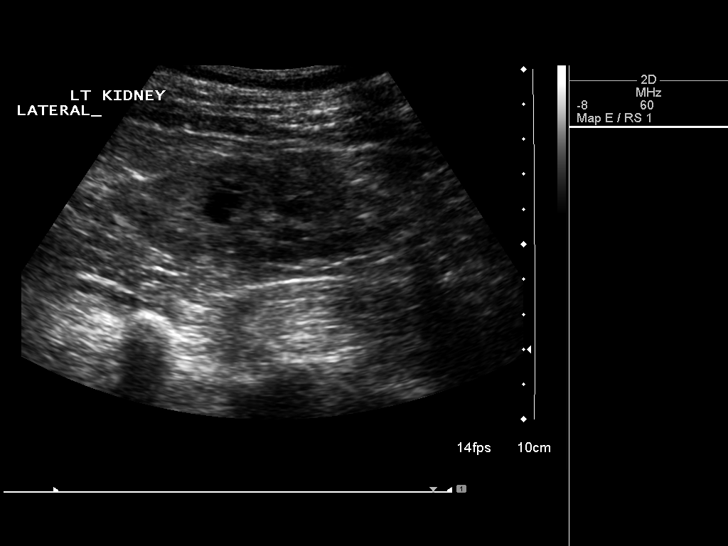
[im 27/41]
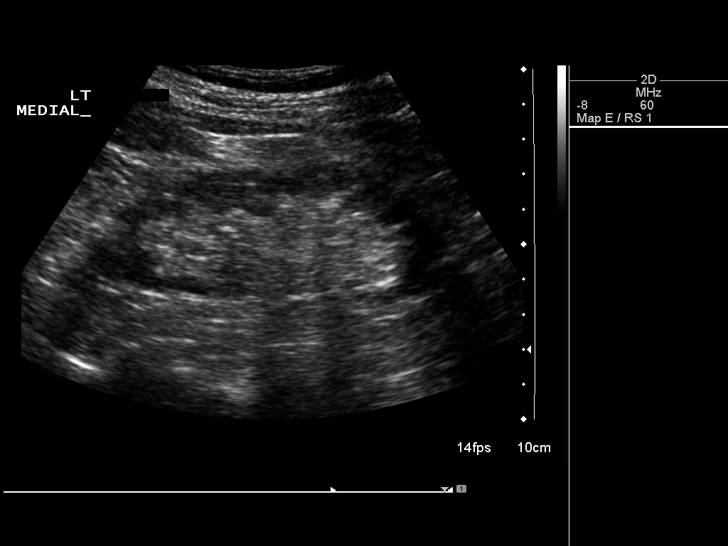
[im 31/41]
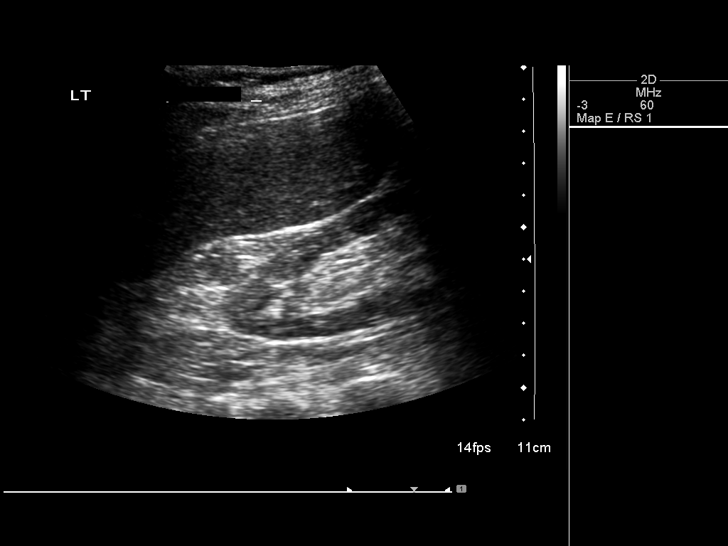
[im 34/41]
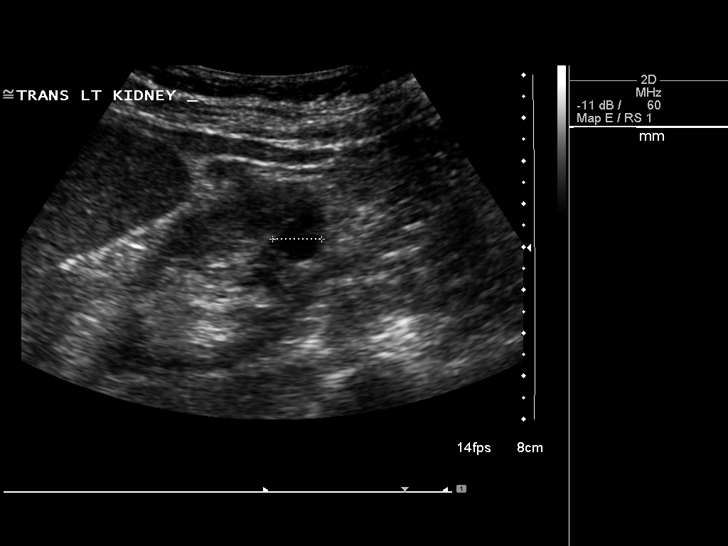
[im 37/41]
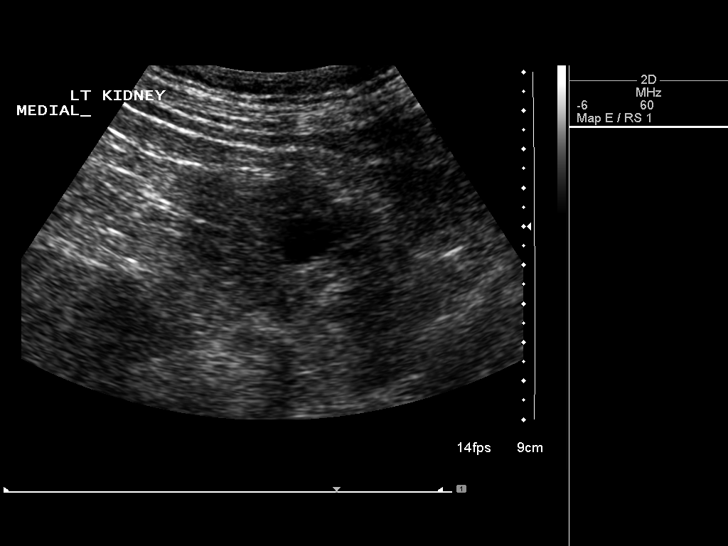
[im 41/41]
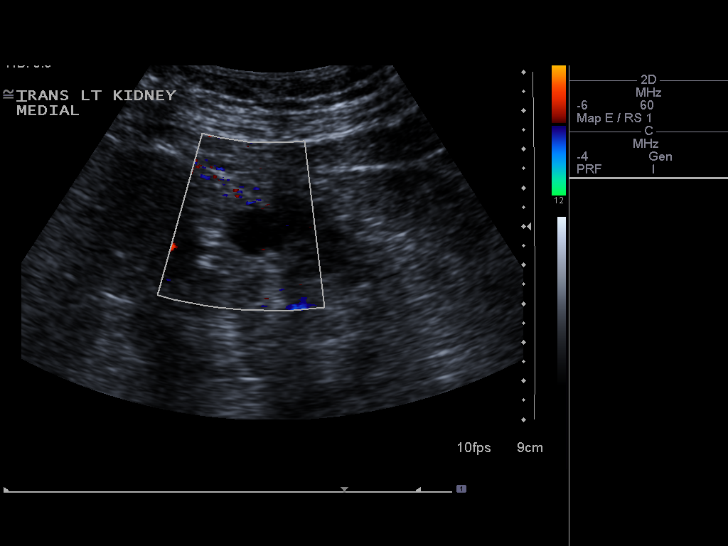

[14 of 25 positions shown; findings below may reference images not displayed]

FINDINGS: Right Kidney:

Length: 9.6 cm. 1.3 cm complex midpole cyst with thin internal
septation. No internal blood flow or mural nodularity. Mild cortical
thinning. Normal echotexture throughout the cortex. No
hydronephrosis.

Left Kidney:

Length: 10.9 cm. Complex midpole cyst measuring up to 14 mm.
Internal septations and echoes. No internal blood flow or mural
nodularity. 1.5 cm simple appearing cyst in the medial midpole. No
hydronephrosis. Mild cortical thinning. Normal echotexture.

Bladder:

Decompressed with Foley catheter placed.
IMPRESSION: Minimally complex bilateral renal cysts, not significantly changed
since prior study.

Mild cortical thinning bilaterally.

No hydronephrosis.

## 2014-12-10 ENCOUNTER — Ambulatory Visit (INDEPENDENT_AMBULATORY_CARE_PROVIDER_SITE_OTHER): Payer: Medicare Other | Admitting: *Deleted

## 2014-12-10 DIAGNOSIS — Z5181 Encounter for therapeutic drug level monitoring: Secondary | ICD-10-CM

## 2014-12-10 DIAGNOSIS — I480 Paroxysmal atrial fibrillation: Secondary | ICD-10-CM

## 2014-12-10 DIAGNOSIS — I4891 Unspecified atrial fibrillation: Secondary | ICD-10-CM

## 2014-12-10 LAB — POCT INR: INR: 2.8

## 2014-12-17 ENCOUNTER — Ambulatory Visit (INDEPENDENT_AMBULATORY_CARE_PROVIDER_SITE_OTHER): Payer: Medicare Other | Admitting: *Deleted

## 2014-12-17 DIAGNOSIS — I4891 Unspecified atrial fibrillation: Secondary | ICD-10-CM | POA: Diagnosis not present

## 2014-12-17 DIAGNOSIS — I480 Paroxysmal atrial fibrillation: Secondary | ICD-10-CM | POA: Diagnosis not present

## 2014-12-17 DIAGNOSIS — Z5181 Encounter for therapeutic drug level monitoring: Secondary | ICD-10-CM | POA: Diagnosis not present

## 2014-12-17 LAB — POCT INR: INR: 2.2

## 2014-12-22 ENCOUNTER — Ambulatory Visit (INDEPENDENT_AMBULATORY_CARE_PROVIDER_SITE_OTHER): Payer: Medicare Other | Admitting: Internal Medicine

## 2014-12-22 ENCOUNTER — Other Ambulatory Visit (INDEPENDENT_AMBULATORY_CARE_PROVIDER_SITE_OTHER): Payer: Medicare Other

## 2014-12-22 ENCOUNTER — Encounter: Payer: Self-pay | Admitting: Internal Medicine

## 2014-12-22 ENCOUNTER — Telehealth: Payer: Self-pay | Admitting: Internal Medicine

## 2014-12-22 VITALS — BP 110/60 | HR 57 | Temp 97.8°F | Ht 64.0 in | Wt 125.0 lb

## 2014-12-22 DIAGNOSIS — I10 Essential (primary) hypertension: Secondary | ICD-10-CM

## 2014-12-22 DIAGNOSIS — E034 Atrophy of thyroid (acquired): Secondary | ICD-10-CM

## 2014-12-22 DIAGNOSIS — I2510 Atherosclerotic heart disease of native coronary artery without angina pectoris: Secondary | ICD-10-CM | POA: Diagnosis not present

## 2014-12-22 DIAGNOSIS — N401 Enlarged prostate with lower urinary tract symptoms: Secondary | ICD-10-CM | POA: Diagnosis not present

## 2014-12-22 DIAGNOSIS — N183 Chronic kidney disease, stage 3 unspecified: Secondary | ICD-10-CM

## 2014-12-22 DIAGNOSIS — I480 Paroxysmal atrial fibrillation: Secondary | ICD-10-CM

## 2014-12-22 DIAGNOSIS — Z23 Encounter for immunization: Secondary | ICD-10-CM | POA: Diagnosis not present

## 2014-12-22 DIAGNOSIS — E038 Other specified hypothyroidism: Secondary | ICD-10-CM

## 2014-12-22 DIAGNOSIS — S72001S Fracture of unspecified part of neck of right femur, sequela: Secondary | ICD-10-CM

## 2014-12-22 DIAGNOSIS — R2681 Unsteadiness on feet: Secondary | ICD-10-CM

## 2014-12-22 DIAGNOSIS — N138 Other obstructive and reflux uropathy: Secondary | ICD-10-CM

## 2014-12-22 DIAGNOSIS — I6523 Occlusion and stenosis of bilateral carotid arteries: Secondary | ICD-10-CM

## 2014-12-22 LAB — CBC WITH DIFFERENTIAL/PLATELET
BASOS ABS: 0 10*3/uL (ref 0.0–0.1)
Basophils Relative: 0.3 % (ref 0.0–3.0)
EOS ABS: 0 10*3/uL (ref 0.0–0.7)
Eosinophils Relative: 0.1 % (ref 0.0–5.0)
HEMATOCRIT: 36.7 % — AB (ref 39.0–52.0)
Hemoglobin: 12.5 g/dL — ABNORMAL LOW (ref 13.0–17.0)
Lymphocytes Relative: 19.4 % (ref 12.0–46.0)
Lymphs Abs: 1.4 10*3/uL (ref 0.7–4.0)
MCHC: 34.1 g/dL (ref 30.0–36.0)
MCV: 88.9 fl (ref 78.0–100.0)
Monocytes Absolute: 0.8 10*3/uL (ref 0.1–1.0)
Monocytes Relative: 11.2 % (ref 3.0–12.0)
NEUTROS ABS: 5.1 10*3/uL (ref 1.4–7.7)
Neutrophils Relative %: 69 % (ref 43.0–77.0)
Platelets: 196 10*3/uL (ref 150.0–400.0)
RBC: 4.13 Mil/uL — AB (ref 4.22–5.81)
RDW: 15.9 % — ABNORMAL HIGH (ref 11.5–15.5)
WBC: 7.4 10*3/uL (ref 4.0–10.5)

## 2014-12-22 LAB — HEMOGLOBIN A1C: Hgb A1c MFr Bld: 5.3 % (ref 4.6–6.5)

## 2014-12-22 LAB — BASIC METABOLIC PANEL
BUN: 20 mg/dL (ref 6–23)
CO2: 31 meq/L (ref 19–32)
Calcium: 9.1 mg/dL (ref 8.4–10.5)
Chloride: 100 mEq/L (ref 96–112)
Creatinine, Ser: 1.26 mg/dL (ref 0.40–1.50)
GFR: 57.54 mL/min — ABNORMAL LOW (ref >60.00)
Glucose, Bld: 106 mg/dL — ABNORMAL HIGH (ref 70–99)
POTASSIUM: 4.1 meq/L (ref 3.5–5.1)
SODIUM: 135 meq/L (ref 135–145)

## 2014-12-22 LAB — TSH: TSH: 3.41 u[IU]/mL (ref 0.35–4.50)

## 2014-12-22 NOTE — Assessment & Plan Note (Signed)
Labs

## 2014-12-22 NOTE — Assessment & Plan Note (Signed)
On Zestril

## 2014-12-22 NOTE — Progress Notes (Signed)
Subjective:    Patient ID: [name], male    DOB: 05-26-1926, 81 y.o.   MRN: [id number]  HPI   New pt - not seen in > 3 years   C/o chronic foley cath Leander Rams  F/u arrhythmia, recent hip fx, chronic anxiety, CAD   Per Dr Aundra Dubin 22/22/26 note: "81 yo with history of CAD, ischemic CMP, CKD, and paroxysmal atrial fibrillation and atrial tachycardia presents for cardiology followup. He speaks little [place] and his daughter interprets. No chest pain. Patient has a history of myalgias with Crestor and Lipitor. He has tolerated pravastatin without myalgias. At a prior appointment, he was noted to be back in atypical atrial flutter versus atrial tachycardia. It was rate-controlled and initially I thought to treat him with a rate control/anticoagulation strategy. However, he started to notice increased exertional dyspnea. Therefore, I took him for cardioversion back to NSR, in 1/15. He has been on amiodarone.  Recently, he has been back in atrial tachycardia again. At a prior appointment with the PA, he was in atrial tachycardia with VA dissociation. Digoxin was stopped. At 8/19 appointment with me, he remained in atrial tachycardia with VA dissociation (but occasionally atrial activity is conducted). He had a high junctional escape with rate in 60s. He felt fine. Later in the month, he had a UTI and went into atrial tachycardia with RVR. I saw him on 8/31. Amiodarone was increased to bid and Lasix was increased due to some volume overload. Amiodarone decreased back to 200 mg daily but at a prior appointment, he was back in atrial tachy with 2:1 block and felt bad. I again increased amiodarone to 200 mg bid. PFTs done in 10/15 showed mild restriction, no marked abnormalities.   He fell in 11/15 and fractured his right hip. This was surgically repaired. He spent some time in rehab but is now at home. He seems to be doing well overall. He has finished home PT. He can walk  without a walker now. He is in NSR today by exam. He tires easily but is not getting short of breath. No further falls. No chest pain. No lightheadedness".   Wt Readings from Last 3 Encounters:  12/22/14 125 lb (56.7 kg)  11/15/14 126 lb 12 oz (57.493 kg)  10/14/14 130 lb (58.968 kg)   BP Readings from Last 3 Encounters:  12/22/14 110/60  11/15/14 110/58  10/14/14 90/60     Review of Systems  Constitutional: Negative for appetite change, fatigue and unexpected weight change.  HENT: Negative for congestion, nosebleeds, sneezing, sore throat and trouble swallowing.   Eyes: Negative for itching and visual disturbance.  Respiratory: Positive for shortness of breath. Negative for cough.   Cardiovascular: Positive for palpitations. Negative for chest pain and leg swelling.  Gastrointestinal: Negative for nausea, diarrhea, blood in stool and abdominal distention.  Genitourinary: Negative for frequency and hematuria.  Musculoskeletal: Positive for arthralgias and gait problem. Negative for back pain, joint swelling and neck pain.  Skin: Negative for rash.  Neurological: Negative for dizziness, tremors, speech difficulty and weakness.  Psychiatric/Behavioral: Negative for suicidal ideas, sleep disturbance, dysphoric mood and agitation. The patient is nervous/anxious.        Objective:   Physical Exam  Constitutional: He is oriented to person, place, and time. He appears well-developed. No distress.  NAD  HENT:  Mouth/Throat: Oropharynx is clear and moist.  Eyes: Conjunctivae are normal. Pupils are equal, round, and reactive to light.  Neck: Normal range of motion. No  JVD present. No thyromegaly present.  Cardiovascular: Normal rate, regular rhythm, normal heart sounds and intact distal pulses.  Exam reveals no gallop and no friction rub.   No murmur heard. Pulmonary/Chest: Effort normal and breath sounds normal. No respiratory distress. He has no wheezes. He has no rales. He  exhibits no tenderness.  Abdominal: Soft. Bowel sounds are normal. He exhibits no distension and no mass. There is no tenderness. There is no rebound and no guarding.  Musculoskeletal: Normal range of motion. He exhibits no edema or tenderness.  Lymphadenopathy:    He has no cervical adenopathy.  Neurological: He is alert and oriented to person, place, and time. He has normal reflexes. No cranial nerve deficit. He exhibits normal muscle tone. He displays a negative Romberg sign. Coordination abnormal. Gait normal.  Skin: Skin is warm and dry. No rash noted.  Psychiatric: Judgment and thought content normal.  in a w/c anxious   Lab Results  Component Value Date   WBC 6.7 10/13/2014   HGB 11.0* 10/13/2014   HCT 33.2* 10/13/2014   PLT 177 10/13/2014   GLUCOSE 109* 10/13/2014   CHOL 176 01/04/2014   TRIG 107.0 01/04/2014   HDL 62.40 01/04/2014   LDLDIRECT 133.2 01/12/2011   LDLCALC 92 01/04/2014   ALT 45 10/13/2014   AST 69* 10/13/2014   NA 138 10/13/2014   K 4.2 10/13/2014   CL 102 10/13/2014   CREATININE 1.57* 10/13/2014   BUN 21 10/13/2014   CO2 26 10/13/2014   TSH 9.09* 08/30/2014   PSA 1.68 02/04/2008   INR 2.2 12/17/2014   HGBA1C 5.6 02/04/2008    > 45 min visit time FTF      Assessment & Plan:

## 2014-12-22 NOTE — Progress Notes (Signed)
Pre visit review using our clinic review tool, if applicable. No additional management support is needed unless otherwise documented below in the visit note. 

## 2014-12-22 NOTE — Telephone Encounter (Signed)
emmi emailed °

## 2014-12-22 NOTE — Assessment & Plan Note (Signed)
Dr Aundra Dubin On Amiodarone, Digoxin, Coumadin

## 2014-12-22 NOTE — Assessment & Plan Note (Signed)
Patient has indwelling catheter

## 2014-12-26 NOTE — Assessment & Plan Note (Signed)
11/16 Progressing fairly well - using a walker

## 2014-12-26 NOTE — Assessment & Plan Note (Signed)
Using a walker 

## 2014-12-26 NOTE — Assessment & Plan Note (Signed)
On Pravachol Labs 

## 2014-12-26 NOTE — Assessment & Plan Note (Signed)
On Levothroid 

## 2014-12-31 ENCOUNTER — Ambulatory Visit (INDEPENDENT_AMBULATORY_CARE_PROVIDER_SITE_OTHER): Payer: Medicare Other | Admitting: *Deleted

## 2014-12-31 DIAGNOSIS — I480 Paroxysmal atrial fibrillation: Secondary | ICD-10-CM

## 2014-12-31 DIAGNOSIS — I4891 Unspecified atrial fibrillation: Secondary | ICD-10-CM | POA: Diagnosis not present

## 2014-12-31 DIAGNOSIS — Z5181 Encounter for therapeutic drug level monitoring: Secondary | ICD-10-CM | POA: Diagnosis not present

## 2014-12-31 LAB — POCT INR: INR: 3.4

## 2015-01-05 ENCOUNTER — Other Ambulatory Visit (HOSPITAL_COMMUNITY): Payer: Self-pay | Admitting: *Deleted

## 2015-01-05 MED ORDER — AMIODARONE HCL 200 MG PO TABS
200.0000 mg | ORAL_TABLET | Freq: Two times a day (BID) | ORAL | Status: DC
Start: 2015-01-05 — End: 2015-02-23

## 2015-01-06 ENCOUNTER — Other Ambulatory Visit: Payer: Self-pay | Admitting: Cardiology

## 2015-01-07 ENCOUNTER — Ambulatory Visit (INDEPENDENT_AMBULATORY_CARE_PROVIDER_SITE_OTHER): Payer: Medicare Other | Admitting: *Deleted

## 2015-01-07 DIAGNOSIS — I480 Paroxysmal atrial fibrillation: Secondary | ICD-10-CM | POA: Diagnosis not present

## 2015-01-07 DIAGNOSIS — I4891 Unspecified atrial fibrillation: Secondary | ICD-10-CM | POA: Diagnosis not present

## 2015-01-07 DIAGNOSIS — Z5181 Encounter for therapeutic drug level monitoring: Secondary | ICD-10-CM

## 2015-01-07 LAB — POCT INR: INR: 2.2

## 2015-01-17 ENCOUNTER — Ambulatory Visit (HOSPITAL_COMMUNITY)
Admission: RE | Admit: 2015-01-17 | Discharge: 2015-01-17 | Disposition: A | Discharge status: Home / Self Care | Payer: Medicare Other | Source: Ambulatory Visit | Attending: Internal Medicine | Admitting: Internal Medicine

## 2015-01-17 ENCOUNTER — Ambulatory Visit (INDEPENDENT_AMBULATORY_CARE_PROVIDER_SITE_OTHER): Payer: Medicare Other | Admitting: *Deleted

## 2015-01-17 VITALS — BP 108/70 | Wt 125.0 lb

## 2015-01-17 DIAGNOSIS — I340 Nonrheumatic mitral (valve) insufficiency: Secondary | ICD-10-CM | POA: Diagnosis not present

## 2015-01-17 DIAGNOSIS — I480 Paroxysmal atrial fibrillation: Secondary | ICD-10-CM | POA: Diagnosis present

## 2015-01-17 DIAGNOSIS — Z7901 Long term (current) use of anticoagulants: Secondary | ICD-10-CM | POA: Insufficient documentation

## 2015-01-17 DIAGNOSIS — N183 Chronic kidney disease, stage 3 unspecified: Secondary | ICD-10-CM

## 2015-01-17 DIAGNOSIS — I6529 Occlusion and stenosis of unspecified carotid artery: Secondary | ICD-10-CM | POA: Diagnosis not present

## 2015-01-17 DIAGNOSIS — Z79899 Other long term (current) drug therapy: Secondary | ICD-10-CM | POA: Diagnosis not present

## 2015-01-17 DIAGNOSIS — I2510 Atherosclerotic heart disease of native coronary artery without angina pectoris: Secondary | ICD-10-CM

## 2015-01-17 DIAGNOSIS — I739 Peripheral vascular disease, unspecified: Secondary | ICD-10-CM | POA: Diagnosis not present

## 2015-01-17 DIAGNOSIS — Z87891 Personal history of nicotine dependence: Secondary | ICD-10-CM | POA: Insufficient documentation

## 2015-01-17 DIAGNOSIS — E039 Hypothyroidism, unspecified: Secondary | ICD-10-CM | POA: Insufficient documentation

## 2015-01-17 DIAGNOSIS — I255 Ischemic cardiomyopathy: Secondary | ICD-10-CM | POA: Insufficient documentation

## 2015-01-17 DIAGNOSIS — E785 Hyperlipidemia, unspecified: Secondary | ICD-10-CM | POA: Insufficient documentation

## 2015-01-17 DIAGNOSIS — Z5181 Encounter for therapeutic drug level monitoring: Secondary | ICD-10-CM | POA: Diagnosis not present

## 2015-01-17 DIAGNOSIS — I5022 Chronic systolic (congestive) heart failure: Secondary | ICD-10-CM | POA: Diagnosis not present

## 2015-01-17 DIAGNOSIS — I471 Supraventricular tachycardia: Secondary | ICD-10-CM | POA: Insufficient documentation

## 2015-01-17 DIAGNOSIS — I4891 Unspecified atrial fibrillation: Secondary | ICD-10-CM

## 2015-01-17 DIAGNOSIS — N189 Chronic kidney disease, unspecified: Secondary | ICD-10-CM | POA: Insufficient documentation

## 2015-01-17 DIAGNOSIS — I4719 Other supraventricular tachycardia: Secondary | ICD-10-CM

## 2015-01-17 DIAGNOSIS — I080 Rheumatic disorders of both mitral and aortic valves: Secondary | ICD-10-CM

## 2015-01-17 DIAGNOSIS — F419 Anxiety disorder, unspecified: Secondary | ICD-10-CM | POA: Diagnosis not present

## 2015-01-17 LAB — COMPREHENSIVE METABOLIC PANEL
ALT: 69 U/L — ABNORMAL HIGH (ref 0–53)
AST: 104 U/L — AB (ref 0–37)
Albumin: 3 g/dL — ABNORMAL LOW (ref 3.5–5.2)
Alkaline Phosphatase: 80 U/L (ref 39–117)
Anion gap: 9 (ref 5–15)
BUN: 19 mg/dL (ref 6–23)
CHLORIDE: 101 mmol/L (ref 96–112)
CO2: 25 mmol/L (ref 19–32)
Calcium: 8.6 mg/dL (ref 8.4–10.5)
Creatinine, Ser: 1.34 mg/dL (ref 0.50–1.35)
GFR calc Af Amer: 53 mL/min — ABNORMAL LOW (ref >90)
GFR calc non Af Amer: 46 mL/min — ABNORMAL LOW (ref >90)
Glucose, Bld: 114 mg/dL — ABNORMAL HIGH (ref 70–99)
Potassium: 3.9 mmol/L (ref 3.5–5.1)
Sodium: 135 mmol/L (ref 135–145)
Total Bilirubin: 0.6 mg/dL (ref 0.3–1.2)
Total Protein: 7 g/dL (ref 6.0–8.3)

## 2015-01-17 LAB — POCT INR: INR: 2.4

## 2015-01-18 DIAGNOSIS — I5022 Chronic systolic (congestive) heart failure: Secondary | ICD-10-CM | POA: Insufficient documentation

## 2015-01-18 NOTE — Progress Notes (Signed)
Patient ID: [name], male   DOB: [birth year], 81 y.o.   MRN: [id number] PCP: Dr.Plotnikov  81 yo with history of CAD, ischemic CMP, CKD, and paroxysmal atrial fibrillation and atrial tachycardia presents for cardiology followup. He speaks little [place] and his daughter interprets.  No chest pain.  Patient has a history of myalgias with Crestor and Lipitor.  He has tolerated pravastatin without myalgias.  At a prior appointment, he was noted to be back in atypical atrial flutter versus atrial tachycardia.  It was rate-controlled and initially I thought to treat him with a rate control/anticoagulation strategy.  However, he started to notice increased exertional dyspnea.  Therefore, I took him for cardioversion back to NSR, in 1/15.  He has been on amiodarone.  He has had paroxysmal atrial tachycardia.  At 8/15 appointment with me, he was in atrial tachycardia with VA dissociation (but occasionally atrial activity is conducted).  Digoxin was stopped.  He had a high junctional escape with rate in 60s.  He felt fine.  Later in the month, he had a UTI and went into atrial tachycardia with RVR.  I saw him on 05/24/14.  Amiodarone was increased to bid and Lasix was increased due to some volume overload.  Amiodarone was decreased back to 200 mg daily but he then went back in atrial tachy with 2:1 block and felt bad.  I again increased amiodarone to 200 mg bid. PFTs done in 10/15 showed mild restriction, no marked abnormalities.   He fell in 11/15 and fractured his right hip. This was surgically repaired. He spent some time in rehab but is now at home.    He seems to be doing well overall.  He is in NSR today.  He walks 30 minutes/day up and down hall in his house.  He uses a walker most of the time.  He is short of breath after walking about 100 feet.  No chest pain.  No orthopnea/PND.  No lightheadedness or falls.    Labs (9/11): K 4.1, creatinine 1.4  Labs (8/12): K 3.8, creatinine 1.3, TSH normal, LFTs  normal, LDL 96, HDL 50 Labs (1/13): K 3.9, creatinine 1.3, TSH normal, LFTs normal Labs (2/13): K 3.8, creatinine 1.3 Labs (5/13): K 3.5, creatinine 1.4, BNP 331, LFTs normal, TSH normal Labs (9/13): LDL 85, HDL 53, BNP 547, LFTs normal Labs (12/13): K 4.3, creatinine 1.49 => 1.4, digoxin 1.4, TSH normal, LFTs normal Labs (1/14): digoxin 1.0, K 4.3, creatinine 1.5 Labs (3/14): free T4 normal Labs (4/14): K 5=> 3.8, creatinine 1.8 => 1.4, digoxin 0.6 Labs (5/14): TSH normal, digoxin level normal Labs (8/14): K 4.2, creatinine 1.66, AST 42, ALT 37 Labs (9/14): K 4, creatinine 1.6, digoxin 1.3, LDL 104, HDL 51, AST 41, ALT 30 Labs (11/14): K 3.6, creatinine 1.5, BNP 126 Labs (12/14): TSH 6.93 Labs (2/15): K 3.7, creatinine 1.4, TSH normal, AST 59, ALT 56 Labs (3/15): TSH low, AST 57, ALT 30, digoxin 1.2 Labs (4/15): K 4, creatinine 1.4, LFTs normal Labs (6/15): TSH normal, digoxin level 0. Labs (8/15): K 4.3, creatinine 1.7 => 1.6 => 1.63, AST 75, ALT 70, hgb 11.8, WBCs 9.7 Labs (10/15): K 4.2, creatinine 1.75, AST 93, ALT 60 Labs (12/15): hgb 10.3, K 4.1, creatinine 1.5, ALT 31, AST 77 Labs (1/16): K 4.2, creatinien 1.51, AST 69, ALT 45, hgb 11, digoxin 0.5 Labs (3/16): K 4.1, creatinine 1.26, TSH normal, HCT 36.7  ECG: NSR, 1st degree AVB, right axis deviation, IVCD 124 msec  Allergies (verified):  1) ! Crestor  2) Vytorin  3) Atenolol   Past Medical History:  1. Coronary artery disease: LHC 4/06 with 60% pLAD, 40% ramus, 60% mRCA, and 90% mPDA. No intervention. Inferior akinesis on LV-gram was suggestive of possible inferior MI with recanalization.  2. Hyperlipidemia: Myalgias with Lipitor and Crestor.  3. Hypothyroidism  4. Peripheral vascular disease  5. Anxiety  6. Skin cancer, hx of 2008 BCC  7. Colonic polyps, hx of  8. Atrial arrhythmias (PAF and atrial tachycardia): Paroxysmal atrial fibrillation with rapid ventricular response treated with amiodarone therapy converting  to normal sinus rhythm 02/2009.  He has also had atypical atrial flutter versus atrial tachycardia noted in 4/14, cardioverted to NSR in 4/14. In atypical flutter versus atrial tachycardia again in 1/15, DCCV in 1/15 to NSR. Atrial tachycardia in 7/15, 8/15, 9/15 with junctional escape in 60s or 2:1 block with faster rate. Off digoxin given PAT with block.  PFTs (10/15) mild restriction, no marked abnormalities.  9. Systolic CHF: Probable ischemic cardiomyopathy. Echo (6/10) with EF 40%, mildly dilated LV, moderate MR.  Echo (3/12): EF 30-35%, inferolateral severe hypokinesis, trivial AI, moderate to severe MR.  10. History of PNA  11. CKD. Baseline creatinine around 1.4.  12. Mitral regurgitation: Moderate by echo 6/10. 2-3+ by cath 2006.  Moderate-severe by echo in 3/12.  13. PVCs  14. BPH 15. Recurrent UTIs 16. Imbalance 17. Carotid stenosis: Dopplers (2/15) with 40-59% BICA stenosis. Carotid dopplers (1/16) with 40-59% BICA stenosis.  18. Possible tendinopathy with Cipro 19. Right hip fracture with repair in 11/15.   Family History:  Family History of CAD Male 1st degree relative <60   Social History:  Retired  Originally from [place], speaks little [place]  Married  Former Smoker   ROS: All systems reviewed and negative except as per HPI.   Current Outpatient Prescriptions  Medication Sig Dispense Refill  . amiodarone (PACERONE) 200 MG tablet Take 1 tablet (200 mg total) by mouth 2 (two) times daily. 180 tablet 3  . Control Gel Formula Dressing (DUODERM CGF DRESSING) MISC Apply 2 each topically 2 (two) times a week. 12 each 0  . digoxin (LANOXIN) 0.125 MG tablet Take 0.5 tablets (0.0625 mg total) by mouth every other day. (Patient taking differently: Take 0.0625 mg by mouth every other day. Takes at noon) 15 tablet 3  . furosemide (LASIX) 20 MG tablet Take 1 tablet (20 mg total) by mouth daily. (Patient taking differently: Take 20 mg by mouth every evening. ) 90 tablet 3  .  furosemide (LASIX) 40 MG tablet Take one tab( 40 mg) in the AM 90 tablet 3  . levothyroxine (SYNTHROID, LEVOTHROID) 125 MCG tablet TAKE 1 TABLET (125 MCG TOTAL) BY MOUTH DAILY BEFORE BREAKFAST. 30 tablet 5  . lisinopril (PRINIVIL,ZESTRIL) 2.5 MG tablet TAKE 1 TABLET (2.5 MG TOTAL) BY MOUTH DAILY. 90 tablet 0  . LORazepam (ATIVAN) 0.5 MG tablet Take 0.5 mg by mouth 2 (two) times daily.    . metoprolol succinate (TOPROL XL) 25 MG 24 hr tablet Take 0.5 tablets (12.5 mg total) by mouth daily. 30 tablet 6  . MILK THISTLE PO Take 325 mg by mouth 3 (three) times daily.    . Multiple Vitamin (MULTIVITAMIN WITH MINERALS) TABS tablet Take 1 tablet by mouth daily.    . Omega-3 Fatty Acids (FISH OIL) 1200 MG CAPS Take 1 capsule (1,200 mg total) by mouth daily. 90 capsule 3  . potassium chloride (K-DUR,KLOR-CON) 10 MEQ tablet  Take 2 tablets (20 mEq total) by mouth daily. 60 tablet 6  . pravastatin (PRAVACHOL) 80 MG tablet Take 1 tablet (80 mg total) by mouth daily. 30 tablet 0  . Sennosides-Docusate Sodium (SENNA) 8.6-50 MG TABS Take 2 tablets by mouth daily.    . tamsulosin (FLOMAX) 0.4 MG CAPS capsule Take 0.4 mg by mouth at bedtime.     Marland Kitchen warfarin (COUMADIN) 1 MG tablet Take 1 tablet (1 mg total) by mouth daily. 45 tablet 3  . zolpidem (AMBIEN) 5 MG tablet Take 1 tablet (5 mg total) by mouth at bedtime as needed for sleep. 90 tablet 1   No current facility-administered medications for this encounter.    BP 108/70 mmHg  Wt 125 lb (56.7 kg) General: NAD Neck: JVP 7 cm; no thyromegaly or thyroid nodule.  Lungs: Slight crackles at bases.   CV: Nondisplaced PMI.  Heart regular, S1/S2, no S3/S4, 1/6 HSM at apex.  No edema.  Right carotid bruit. Abdomen: Soft, nontender, no hepatosplenomegaly, no distention.  Neurologic: Alert and oriented x 3.  Psych: Normal affect. Extremities: No clubbing or cyanosis.   Assessment/Plan: 1. Atrial arrhythmias  Patient remainsin NSR.  He seems to stay in NSR more at  amiodarone 200 mg bid, so I will leave him at this dose.  Recent PFTs did not suggest amiodarone lung toxicity.  Main concern at this point has been hepatic toxicity given some rise in LFTs, but recently this has been stable.  - Keep amiodarone at 200 mg bid, check LFTs at followup appt in 2 months.   - Continue current Toprol XL and warfarin.  2. HYPERLIPIDEMIA  He is tolerating pravastatin without myalgias. Had myalgias with Lipitor and Crestor.  LDL not ideal, but will continue current pravastatin as he tolerates it.    3. MITRAL INSUFFICIENCY Last echo showed moderate to severe MR. Given age and medical condition, would plan medical management.  4. SYSTOLIC HEART FAILURE, CHRONIC  EF 30-35% on last echo. NYHA class II-III symptoms,stable.  He is euvolemic.  - Continue Lasix to 40 qam/20 qpm.    - Continue current lisinopril and Toprol XL.  - Continue digoxin    5. Carotid stenosis: Stable mild to moderate disease.  Repeat 1/17.  6. CKD: Stable creatinine.   Loralie Champagne 01/18/2015

## 2015-01-26 ENCOUNTER — Ambulatory Visit (HOSPITAL_COMMUNITY)
Admission: RE | Admit: 2015-01-26 | Discharge: 2015-01-26 | Disposition: A | Discharge status: Home / Self Care | Payer: Medicare Other | Source: Ambulatory Visit | Attending: Internal Medicine | Admitting: Internal Medicine

## 2015-01-26 DIAGNOSIS — I5022 Chronic systolic (congestive) heart failure: Secondary | ICD-10-CM | POA: Insufficient documentation

## 2015-01-26 LAB — BASIC METABOLIC PANEL
Anion gap: 11 (ref 5–15)
BUN: 19 mg/dL (ref 6–20)
CALCIUM: 8.6 mg/dL — AB (ref 8.9–10.3)
CO2: 25 mmol/L (ref 22–32)
CREATININE: 1.36 mg/dL — AB (ref 0.61–1.24)
Chloride: 99 mmol/L — ABNORMAL LOW (ref 101–111)
GFR calc Af Amer: 52 mL/min — ABNORMAL LOW (ref >60)
GFR calc non Af Amer: 45 mL/min — ABNORMAL LOW (ref >60)
Glucose, Bld: 115 mg/dL — ABNORMAL HIGH (ref 70–99)
Potassium: 4 mmol/L (ref 3.5–5.1)
Sodium: 135 mmol/L (ref 135–145)

## 2015-01-27 ENCOUNTER — Telehealth (HOSPITAL_COMMUNITY): Payer: Self-pay | Admitting: Vascular Surgery

## 2015-01-27 LAB — HEPATIC FUNCTION PANEL
ALBUMIN: 3 g/dL — AB (ref 3.5–5.0)
ALT: 68 U/L — ABNORMAL HIGH (ref 17–63)
AST: 106 U/L — AB (ref 15–41)
Alkaline Phosphatase: 81 U/L (ref 38–126)
BILIRUBIN TOTAL: 0.7 mg/dL (ref 0.3–1.2)
Bilirubin, Direct: 0.2 mg/dL (ref 0.1–0.5)
Indirect Bilirubin: 0.5 mg/dL (ref 0.3–0.9)
TOTAL PROTEIN: 7.1 g/dL (ref 6.5–8.1)

## 2015-01-27 NOTE — Telephone Encounter (Signed)
Pt daughter called she would like lab results form yesterday

## 2015-01-28 ENCOUNTER — Encounter: Payer: Self-pay | Admitting: Cardiology

## 2015-01-28 NOTE — Telephone Encounter (Signed)
-----   Message from Larey Dresser, MD sent at 01/28/2015 12:11 PM EDT ----- Stop pravastatin.  Continue amiodarone for now but repeat LFTs in 2 wks.

## 2015-01-28 NOTE — Telephone Encounter (Signed)
daughter aware, repeat labs sch for 5/18

## 2015-02-07 ENCOUNTER — Ambulatory Visit (INDEPENDENT_AMBULATORY_CARE_PROVIDER_SITE_OTHER): Payer: Medicare Other | Admitting: *Deleted

## 2015-02-07 DIAGNOSIS — Z5181 Encounter for therapeutic drug level monitoring: Secondary | ICD-10-CM | POA: Diagnosis not present

## 2015-02-07 DIAGNOSIS — I480 Paroxysmal atrial fibrillation: Secondary | ICD-10-CM

## 2015-02-07 DIAGNOSIS — I4891 Unspecified atrial fibrillation: Secondary | ICD-10-CM

## 2015-02-07 LAB — POCT INR: INR: 2.6

## 2015-02-07 MED ORDER — WARFARIN SODIUM 1 MG PO TABS: 1.0000 mg | ORAL_TABLET | Freq: Every day | ORAL | Status: DC

## 2015-02-09 ENCOUNTER — Ambulatory Visit (HOSPITAL_COMMUNITY)
Admission: RE | Admit: 2015-02-09 | Discharge: 2015-02-09 | Disposition: A | Discharge status: Home / Self Care | Payer: Medicare Other | Source: Ambulatory Visit | Attending: Internal Medicine | Admitting: Internal Medicine

## 2015-02-09 DIAGNOSIS — I5022 Chronic systolic (congestive) heart failure: Secondary | ICD-10-CM | POA: Diagnosis not present

## 2015-02-09 LAB — HEPATIC FUNCTION PANEL
ALT: 78 U/L — AB (ref 17–63)
AST: 117 U/L — ABNORMAL HIGH (ref 15–41)
Albumin: 2.7 g/dL — ABNORMAL LOW (ref 3.5–5.0)
Alkaline Phosphatase: 94 U/L (ref 38–126)
BILIRUBIN DIRECT: 0.2 mg/dL (ref 0.1–0.5)
Indirect Bilirubin: 0.4 mg/dL (ref 0.3–0.9)
TOTAL PROTEIN: 6.5 g/dL (ref 6.5–8.1)
Total Bilirubin: 0.6 mg/dL (ref 0.3–1.2)

## 2015-02-10 ENCOUNTER — Encounter: Payer: Self-pay | Admitting: Cardiology

## 2015-02-11 ENCOUNTER — Ambulatory Visit: Payer: Medicare Other | Admitting: Internal Medicine

## 2015-02-16 ENCOUNTER — Ambulatory Visit: Payer: Medicare Other | Attending: Orthopaedic Surgery | Admitting: Physical Therapy

## 2015-02-16 ENCOUNTER — Encounter: Payer: Self-pay | Admitting: Physical Therapy

## 2015-02-16 DIAGNOSIS — R531 Weakness: Secondary | ICD-10-CM | POA: Diagnosis not present

## 2015-02-16 DIAGNOSIS — R293 Abnormal posture: Secondary | ICD-10-CM | POA: Diagnosis not present

## 2015-02-16 DIAGNOSIS — R269 Unspecified abnormalities of gait and mobility: Secondary | ICD-10-CM | POA: Diagnosis not present

## 2015-02-16 NOTE — Therapy (Signed)
[place], [place], [address] Phone: [phone]   Fax:  [phone]  Physical Therapy Evaluation  Patient Details  Name: [name] MRN: [id number] Date of Birth: 10-07-1925 Referring Provider:  Mcarthur Rossetti*  Encounter Date: 02/16/2015      PT End of Session - 02/16/15 1527    Visit Number 1   Number of Visits 16   Date for PT Re-Evaluation 04/13/15   Authorization Type Medicare/Medicaid, KX at Visit 12 due to prior HHPT   Authorization Time Period annual   Authorization - Visit Number 1   Authorization - Number of Visits 12   PT Start Time 3474   PT Stop Time 1630   PT Time Calculation (min) 52 min      Past Medical History  Diagnosis Date  . CAD (coronary artery disease)     a. LHC 4/06 with 60% pLAD, 40% ramus, 60% mRCA, and 90% mPDA. No intervention. Inferior akinesis on LV-gram was suggestive of possible inferior MI with recanalization.  . Hyperlipidemia     a. Myalgias with Lipitor and Crestor.  . Hypothyroidism   . Peripheral vascular disease   . Anxiety   . Hx of colonic polyps   . Paroxysmal atrial fibrillation 02/2009     a. With RVR tx with amiodarone returning to NSR 02/2009. b. Had atypical aflutter vs atrial tach 4/14, converted to NSR 4/14. In atypical flutter vs atrial tach in 1/15, DCCV 1/15 to NSR. c. Atrial tach 7/15 with junctional escape 60s.  . Systolic CHF     a.  Probable ischemic cardiomyopathy. Echo (6/10) with EF 40%, mildly dilated LV, moderate MR. ;  b.  echo 3/12: mild LVH, EF 30-35%, IL HK and Lat HK, mod to severe MR, trivial AI, mod LAE  . PNA (pneumonia)     History of   . Mitral regurgitation     a. Moderate by echo 6/10. b. 2-3+ by cath 2006; mod to severe by echo 3/12  . PVC's (premature ventricular contractions)   . Cataract   . Heart murmur   . CKD (chronic kidney disease), stage III   . Skin cancer     Hx of 2008 BCC  . Chronic  indwelling Foley catheter     per family, bladder muscles no longer work  . Depression   . Myocardial infarction   . Basal cell carcinoma   . Paroxysmal atrial tachycardia     a. See details under PAF.  Marland Kitchen Paroxysmal atrial flutter     a. See details under PAF.  Marland Kitchen BPH (benign prostatic hyperplasia)   . Recurrent UTI   . Imbalance   . Carotid stenosis     a. Dopplers 2/15 - 40-59% BICA.    Past Surgical History  Procedure Laterality Date  . Skin cancer excision  2008    shoulder and nose   . Cardioversion N/A 01/02/2013    Procedure: CARDIOVERSION;  Surgeon: Larey Dresser, MD;  Location: [facility];  Service: Cardiovascular;  Laterality: N/A;  . Cardioversion N/A 10/23/2013    Procedure: CARDIOVERSION;  Surgeon: Larey Dresser, MD;  Location: [facility];  Service: Cardiovascular;  Laterality: N/A;  . Hip arthroplasty Right 08/21/2014    Procedure: ARTHROPLASTY BIPOLAR HIP;  Surgeon: Mcarthur Rossetti, MD;  Location: [facility];  Service: Orthopedics;  Laterality: Right;    There were no vitals filed for this visit.  Visit Diagnosis:  Abnormality of gait - Plan:  PT PLAN OF CARE CERT/RE-CERT  Abnormal posture - Plan: PT PLAN OF CARE CERT/RE-CERT  Weakness generalized - Plan: PT PLAN OF CARE CERT/RE-CERT      Subjective Assessment - 02/16/15 1541    Subjective problems with balance and walking, walks with RW, reports lightheadedness after about 5 minutes of standing, LOB more when tired, overall mobility has further declined since hip surgery   Patient is accompained by: Interpreter   Limitations Walking;Standing   How long can you stand comfortably? 5   Patient Stated Goals improve balance and walking   Currently in Pain? No/denies  none in sitting, reports intermittent pain with standing in lateral R leg and along groin, rated 6/10 with ~600 feet with RW            [facility] PT Assessment - 02/16/15 0001    Assessment   Medical Diagnosis Hx R Partial Hip due to hip  fx   Prior Therapy SNF then HHPT which completed inJan 2016   Precautions   Precautions Fall   Precaution Comments unable to lie flat due to long hx of vertigo   Restrictions   Weight Bearing Restrictions No   Balance Screen   Has the patient fallen in the past 6 months Yes   How many times? 1   Has the patient had a decrease in activity level because of a fear of falling?  No  fatigue and SOB limit mobility per pt report   Is the patient reluctant to leave their home because of a fear of falling?  No   Home Environment   Living Environment Assisted living   [place] - 2 wheels   Additional Comments lives with wife, needs assist now with bathing, dressing, showering, walking; has caregiver 80 hours per week and assist with getting out of bed and back to bed   Prior Function   Level of Independence Independent with household mobility with device;Independent with basic ADLs   Cognition   Overall Cognitive Status Within Functional Limits for tasks assessed   Observation/Other Assessments   Other Surveys  Other Surveys   Lower Extremity Functional Scale  6/80  severe limitation   Posture/Postural Control   Posture/Postural Control Postural limitations   Postural Limitations Rounded Shoulders;Forward head;Decreased lumbar lordosis;Posterior pelvic tilt   ROM / Strength   AROM / PROM / Strength Strength   Strength   Overall Strength Comments L DF 3+/5, R hip flexion 3+/5, bill hip abdct 3/5, otherwise 4/5 throughout   Bed Mobility   Bed Mobility Rolling Right;Rolling Left;Sit to Supine   Rolling Right 6: Modified independent (Device/Increase time)   Rolling Left 6: Modified independent (Device/Increase time)   Sit to Supine 6: Modified independent (Device/Increase time)   Transfers   Transfers Stand Pivot Transfers   Stand Pivot Transfers 4: Min guard  supervision to min A with turning, UE A with sit to stand   Ambulation/Gait   Ambulation/Gait Yes   Ambulation/Gait  Assistance 4: Min guard   Ambulation Distance (Feet) 100 Feet  reports up to 600 feet   Assistive device Rolling walker   Gait Comments Forward flexed onto walker, decr,. knee extension bil.   Standardized Balance Assessment   Standardized Balance Assessment Berg Balance Test;Timed Up and Go Test;10 meter walk test   10 Meter Walk 1.73 ft/sec with RW (< 1.8 ft/sec indicative of high fall risk)   Berg Balance Test   Sit to Stand Able to stand  independently using hands  Standing Unsupported Able to stand 2 minutes with supervision   Sitting with Back Unsupported but Feet Supported on Floor or Stool Able to sit safely and securely 2 minutes   Stand to Sit Controls descent by using hands   Transfers Able to transfer with verbal cueing and /or supervision   Standing Unsupported with Eyes Closed Able to stand 10 seconds with supervision   Standing Ubsupported with Feet Together Able to place feet together independently and stand for 1 minute with supervision   From Standing, Reach Forward with Outstretched Arm Loses balance while trying/requires external support   From Standing Position, Pick up Object from Floor Unable to try/needs assist to keep balance   From Standing Position, Turn to Look Behind Over each Shoulder Needs supervision when turning   Turn 360 Degrees Needs assistance while turning   Standing Unsupported, Alternately Place Feet on Step/Stool Needs assistance to keep from falling or unable to try   Standing Unsupported, One Foot in [place] to take small step independently and hold 30 seconds   Standing on One Leg Unable to try or needs assist to prevent fall   Total Score 24   Timed Up and Go Test   TUG Normal TUG   Normal TUG (seconds) 49.27  with RW   TUG Comments close supervision to min A                             PT Short Term Goals - 02/16/15 1707    PT SHORT TERM GOAL #1   Title Pt will be independent with initial HEP for strengthening  and balance (03/16/15)   Time 4   Period Weeks   Status New   PT SHORT TERM GOAL #2   Title Pt will improve Berg to 30/56 to decrease fall risk. (03/16/15)   Time 4   Period Weeks   Status New   PT SHORT TERM GOAL #3   Title Pt will report </= 4/10 pain with walking of 600 feet to improve mobility within household. 96/22/16)   Time 4   Period Weeks   Status New           PT Long Term Goals - 02/16/15 1711    PT LONG TERM GOAL #1   Title Pt/caregiver will be independent with advanced HEP. (04/13/15)   Time 8   Period Weeks   Status New   PT LONG TERM GOAL #2   Title Pt will improve Berg to >/= 36/56 to decrease fall risk. (04/13/15)   Time 8   Period Weeks   Status New   PT LONG TERM GOAL #3   Title Pt will report pain </= 2/10 with walking up to 600' for improved mobility in household. (04/13/15)   Time 8   Period Weeks   Status New   PT LONG TERM GOAL #4   Title Pt will improve gait velocity to >/= 2.0 ft/sec for decreased fall risk. (04/13/15)   Time 8   Period Weeks   Status New   PT LONG TERM GOAL #5   Title Pt will improve hip strength to >/= 4/5 for improved ability with household tasks. (04/13/15)   Time 8   Period Weeks   Status New               Plan - 02/16/15 1651    Clinical Impression Statement Pt is an 81 yo male presenting to OP  PT s/p partial hip replacement secondary to hip fx which resulted from fall in Nov 2015. Prior to surgery pt was independent with ambulation without device and able to perform all self-care activities independently. Pt is now requiring moderate assistance with self-care activities and min A with gait with RW. LEFS 6/80. Per pt report, he had therapy in a SNF following surgery and a couple of weeks of HHPT in Jan 2016 and no further services. Pt and wife live in ALF and have 86 hours of caregiver assistance per week, including 7 days of care with assistance getting out of bed and back to bed. Caregiver also does some exercises  with the patient. Pt is at high fall risk per Merrilee Jansky, Timed up and go, and 10 meter walk test/gait velocity. Pt demonstrates decreased hip strength bil and a fear of standing without UE support.    Pt will benefit from skilled therapeutic intervention in order to improve on the following deficits Abnormal gait;Decreased activity tolerance;Decreased balance;Decreased endurance;Decreased mobility;Decreased safety awareness;Dizziness;Difficulty walking;Decreased strength;Postural dysfunction;Pain   Rehab Potential Good   PT Frequency 2x / week   PT Duration 8 weeks   PT Treatment/Interventions ADLs/Self Care Home Management;Cryotherapy;Electrical Stimulation;Moist Heat;Ultrasound;Gait training;Functional mobility training;Therapeutic activities;Therapeutic exercise;Balance training;Neuromuscular re-education;Patient/family education;Manual techniques;Vestibular   PT Next Visit Plan initiate HEP for hip/core and lower extremity strengthening, standing balance, 6 minute walk test with RW   Consulted and Agree with Plan of Care Patient          G-Codes - 2015-02-23 1716    Functional Assessment Tool Used clinical judgement - BERG 24/56   Functional Limitation Mobility: Walking and moving around   Mobility: Walking and Moving Around Current Status [phone]) At least 80 percent but less than 100 percent impaired, limited or restricted   Mobility: Walking and Moving Around Goal Status [phone]) At least 60 percent but less than 80 percent impaired, limited or restricted       Problem List Patient Active Problem List   Diagnosis Date Noted  . Chronic systolic CHF (congestive heart failure) 01/18/2015  . History of right hip hemiarthroplasty   . Closed right hip fracture   . Fall   . Paroxysmal atrial tachycardia   . Paroxysmal atrial flutter   . CKD (chronic kidney disease), stage III   . Encounter for therapeutic drug monitoring 11/03/2013  . Eyelid cyst 07/09/2012  . Insomnia 11/18/2011  . BPH  with urinary obstruction 05/14/2011  . HYPERTENSION, BENIGN 08/04/2009  . MITRAL INSUFFICIENCY 04/04/2009  . SYSTOLIC HEART FAILURE, CHRONIC 04/04/2009  . Paroxysmal atrial fibrillation 02/22/2009  . Unsteady gait 10/13/2008  . INSOMNIA, PERSISTENT 02/11/2008  . Hypothyroidism 07/21/2007  . ANXIETY 07/21/2007  . Coronary atherosclerosis 07/21/2007  . PERIPHERAL VASCULAR DISEASE 07/21/2007  . SKIN CANCER, HX OF 07/21/2007  . COLONIC POLYPS, HX OF 07/21/2007    Romualdo Bolk, PT 2015-02-23, 5:21 PM  [facility] [address] [place], [place], [address] Phone: [phone]   Fax:  [phone]

## 2015-02-17 ENCOUNTER — Telehealth: Payer: Self-pay | Admitting: *Deleted

## 2015-02-17 DIAGNOSIS — Z79899 Other long term (current) drug therapy: Secondary | ICD-10-CM

## 2015-02-17 NOTE — Telephone Encounter (Signed)
Lab ordered to repeat hepatic panel 3 wks after decrease in amiodarone.  Pt will have drawn at HF clinic

## 2015-02-17 NOTE — Telephone Encounter (Signed)
Daughter calls back and pts Amiodarone was reduced on 02/10/15, thus f/u appt moved up to 02/23/15 for recheck due to med change.

## 2015-02-17 NOTE — Addendum Note (Signed)
Addended by: Rockne Menghini on: 02/17/2015 10:38 AM   Modules accepted: Orders

## 2015-02-17 NOTE — Telephone Encounter (Signed)
New Message   Patient's daughter's is calling about medication amidrone. She would like to check on his coumadin. Please give patients daughter a call back.

## 2015-02-23 ENCOUNTER — Ambulatory Visit (INDEPENDENT_AMBULATORY_CARE_PROVIDER_SITE_OTHER): Payer: Medicare Other | Admitting: *Deleted

## 2015-02-23 DIAGNOSIS — I4891 Unspecified atrial fibrillation: Secondary | ICD-10-CM

## 2015-02-23 DIAGNOSIS — Z5181 Encounter for therapeutic drug level monitoring: Secondary | ICD-10-CM

## 2015-02-23 DIAGNOSIS — I480 Paroxysmal atrial fibrillation: Secondary | ICD-10-CM

## 2015-02-23 LAB — POCT INR: INR: 2.2

## 2015-02-28 ENCOUNTER — Ambulatory Visit: Payer: Medicare Other | Attending: Orthopaedic Surgery

## 2015-02-28 DIAGNOSIS — R29818 Other symptoms and signs involving the nervous system: Secondary | ICD-10-CM | POA: Insufficient documentation

## 2015-02-28 DIAGNOSIS — R269 Unspecified abnormalities of gait and mobility: Secondary | ICD-10-CM

## 2015-02-28 DIAGNOSIS — R531 Weakness: Secondary | ICD-10-CM | POA: Diagnosis present

## 2015-02-28 DIAGNOSIS — R293 Abnormal posture: Secondary | ICD-10-CM | POA: Diagnosis present

## 2015-02-28 NOTE — Patient Instructions (Signed)
Asked patient to bring in HEP for review. He agreed

## 2015-02-28 NOTE — Therapy (Signed)
[place], [place], [address] Phone: [phone]   Fax:  [phone]  Physical Therapy Treatment  Patient Details  Name: [name] MRN: [id number] Date of Birth: 1926/06/14 Referring Provider:  Cassandria Anger, MD  Encounter Date: 02/28/2015      PT End of Session - 02/28/15 1317    Visit Number 2   Number of Visits 16   Date for PT Re-Evaluation 04/13/15   PT Start Time 1240   PT Stop Time 1320   PT Time Calculation (min) 40 min      Past Medical History  Diagnosis Date  . CAD (coronary artery disease)     a. LHC 4/06 with 60% pLAD, 40% ramus, 60% mRCA, and 90% mPDA. No intervention. Inferior akinesis on LV-gram was suggestive of possible inferior MI with recanalization.  . Hyperlipidemia     a. Myalgias with Lipitor and Crestor.  . Hypothyroidism   . Peripheral vascular disease   . Anxiety   . Hx of colonic polyps   . Paroxysmal atrial fibrillation 02/2009     a. With RVR tx with amiodarone returning to NSR 02/2009. b. Had atypical aflutter vs atrial tach 4/14, converted to NSR 4/14. In atypical flutter vs atrial tach in 1/15, DCCV 1/15 to NSR. c. Atrial tach 7/15 with junctional escape 60s.  . Systolic CHF     a.  Probable ischemic cardiomyopathy. Echo (6/10) with EF 40%, mildly dilated LV, moderate MR. ;  b.  echo 3/12: mild LVH, EF 30-35%, IL HK and Lat HK, mod to severe MR, trivial AI, mod LAE  . PNA (pneumonia)     History of   . Mitral regurgitation     a. Moderate by echo 6/10. b. 2-3+ by cath 2006; mod to severe by echo 3/12  . PVC's (premature ventricular contractions)   . Cataract   . Heart murmur   . CKD (chronic kidney disease), stage III   . Skin cancer     Hx of 2008 BCC  . Chronic indwelling Foley catheter     per family, bladder muscles no longer work  . Depression   . Myocardial infarction   . Basal cell carcinoma   . Paroxysmal atrial tachycardia     a. See  details under PAF.  Marland Kitchen Paroxysmal atrial flutter     a. See details under PAF.  Marland Kitchen BPH (benign prostatic hyperplasia)   . Recurrent UTI   . Imbalance   . Carotid stenosis     a. Dopplers 2/15 - 40-59% BICA.    Past Surgical History  Procedure Laterality Date  . Skin cancer excision  2008    shoulder and nose   . Cardioversion N/A 01/02/2013    Procedure: CARDIOVERSION;  Surgeon: Larey Dresser, MD;  Location: [place];  Service: Cardiovascular;  Laterality: N/A;  . Cardioversion N/A 10/23/2013    Procedure: CARDIOVERSION;  Surgeon: Larey Dresser, MD;  Location: [facility] [place] ENDOSCOPY;  Service: Cardiovascular;  Laterality: N/A;  . Hip arthroplasty Right 08/21/2014    Procedure: ARTHROPLASTY BIPOLAR HIP;  Surgeon: Mcarthur Rossetti, MD;  Location: [place];  Service: Orthopedics;  Laterality: Right;    There were no vitals filed for this visit.  Visit Diagnosis:  Abnormality of gait  Weakness generalized      Subjective Assessment - 02/28/15 1238    Subjective Main problem is balance. No complaint of pain   Currently in Pain? No/denies  [place] Adult PT Treatment/Exercise - 02/28/15 1243    Exercises   Exercises Knee/Hip   Knee/Hip Exercises: Aerobic   Stationary Bike Nu step L5 5 min   Knee/Hip Exercises: Standing   Heel Raises 15 reps;2 sets   Other Standing Knee Exercises We worked in [place] with walking  backward and forward with and without Ue suppport but with contact guard.   Knee/Hip Exercises: Seated   Long Arc Quad Strengthening;Right;Left;1 set;15 reps   Long Arc Quad Weight 3 lbs.   Other Seated Knee Exercises hip flexion 3 pounds x15 RT and Lt    Knee/Hip Exercises: Supine   Hip Adduction Isometric Strengthening;Both;1 set;15 reps  ball squeeze   Bridges Strengthening;Both;1 set;15 reps   Straight Leg Raises AROM;Right;Left;10 reps   Other Supine Knee Exercises bilateral clam with green band x 15                   PT Short Term Goals - 02/16/15 1707    PT SHORT TERM GOAL #1   Title Pt will be independent with initial HEP for strengthening and balance (03/16/15)   Time 4   Period Weeks   Status New   PT SHORT TERM GOAL #2   Title Pt will improve Berg to 30/56 to decrease fall risk. (03/16/15)   Time 4   Period Weeks   Status New   PT SHORT TERM GOAL #3   Title Pt will report </= 4/10 pain with walking of 600 feet to improve mobility within household. 96/22/16)   Time 4   Period Weeks   Status New           PT Long Term Goals - 02/16/15 1711    PT LONG TERM GOAL #1   Title Pt/caregiver will be independent with advanced HEP. (04/13/15)   Time 8   Period Weeks   Status New   PT LONG TERM GOAL #2   Title Pt will improve Berg to >/= 36/56 to decrease fall risk. (04/13/15)   Time 8   Period Weeks   Status New   PT LONG TERM GOAL #3   Title Pt will report pain </= 2/10 with walking up to 600' for improved mobility in household. (04/13/15)   Time 8   Period Weeks   Status New   PT LONG TERM GOAL #4   Title Pt will improve gait velocity to >/= 2.0 ft/sec for decreased fall risk. (04/13/15)   Time 8   Period Weeks   Status New   PT LONG TERM GOAL #5   Title Pt will improve hip strength to >/= 4/5 for improved ability with household tasks. (04/13/15)   Time 8   Period Weeks   Status New               Problem List Patient Active Problem List   Diagnosis Date Noted  . Chronic systolic CHF (congestive heart failure) 01/18/2015  . History of right hip hemiarthroplasty   . Closed right hip fracture   . Fall   . Paroxysmal atrial tachycardia   . Paroxysmal atrial flutter   . CKD (chronic kidney disease), stage III   . Encounter for therapeutic drug monitoring 11/03/2013  . Eyelid cyst 07/09/2012  . Insomnia 11/18/2011  . BPH with urinary obstruction 05/14/2011  . HYPERTENSION, BENIGN 08/04/2009  . MITRAL INSUFFICIENCY 04/04/2009  . SYSTOLIC HEART  FAILURE, CHRONIC 04/04/2009  . Paroxysmal atrial fibrillation 02/22/2009  . Unsteady gait 10/13/2008  . INSOMNIA,  PERSISTENT 02/11/2008  . Hypothyroidism 07/21/2007  . ANXIETY 07/21/2007  . Coronary atherosclerosis 07/21/2007  . PERIPHERAL VASCULAR DISEASE 07/21/2007  . SKIN CANCER, HX OF 07/21/2007  . COLONIC POLYPS, HX OF 07/21/2007    Darrel Hoover PT 02/28/2015, 2:07 PM  [place] [facility] [address] [place], [place], [address] Phone: [phone]   Fax:  [phone]

## 2015-03-02 ENCOUNTER — Ambulatory Visit (HOSPITAL_COMMUNITY)
Admission: RE | Admit: 2015-03-02 | Discharge: 2015-03-02 | Disposition: A | Discharge status: Home / Self Care | Payer: Medicare Other | Source: Ambulatory Visit | Attending: Internal Medicine | Admitting: Internal Medicine

## 2015-03-02 DIAGNOSIS — I5022 Chronic systolic (congestive) heart failure: Secondary | ICD-10-CM | POA: Diagnosis present

## 2015-03-02 LAB — BASIC METABOLIC PANEL
Anion gap: 8 (ref 5–15)
BUN: 20 mg/dL (ref 6–20)
CALCIUM: 8.5 mg/dL — AB (ref 8.9–10.3)
CHLORIDE: 101 mmol/L (ref 101–111)
CO2: 25 mmol/L (ref 22–32)
CREATININE: 1.35 mg/dL — AB (ref 0.61–1.24)
GFR calc Af Amer: 53 mL/min — ABNORMAL LOW (ref >60)
GFR calc non Af Amer: 46 mL/min — ABNORMAL LOW (ref >60)
GLUCOSE: 118 mg/dL — AB (ref 65–99)
Potassium: 4.3 mmol/L (ref 3.5–5.1)
SODIUM: 134 mmol/L — AB (ref 135–145)

## 2015-03-04 ENCOUNTER — Ambulatory Visit: Payer: Medicare Other

## 2015-03-04 DIAGNOSIS — R269 Unspecified abnormalities of gait and mobility: Secondary | ICD-10-CM

## 2015-03-04 DIAGNOSIS — R531 Weakness: Secondary | ICD-10-CM

## 2015-03-04 NOTE — Therapy (Signed)
[place], [place], [address] Phone: [phone]   Fax:  [phone]  Physical Therapy Treatment  Patient Details  Name: [name] MRN: [id number] Date of Birth: 03-01-1926 Referring Provider:  Mcarthur Rossetti*  Encounter Date: 03/04/2015      PT End of Session - 03/04/15 1106    Visit Number 3   Number of Visits 16   Date for PT Re-Evaluation 04/13/15   PT Start Time 1020   PT Stop Time 1100   PT Time Calculation (min) 40 min   Activity Tolerance Patient tolerated treatment well   Behavior During Therapy [facility] for tasks assessed/performed      Past Medical History  Diagnosis Date  . CAD (coronary artery disease)     a. LHC 4/06 with 60% pLAD, 40% ramus, 60% mRCA, and 90% mPDA. No intervention. Inferior akinesis on LV-gram was suggestive of possible inferior MI with recanalization.  . Hyperlipidemia     a. Myalgias with Lipitor and Crestor.  . Hypothyroidism   . Peripheral vascular disease   . Anxiety   . Hx of colonic polyps   . Paroxysmal atrial fibrillation 02/2009     a. With RVR tx with amiodarone returning to NSR 02/2009. b. Had atypical aflutter vs atrial tach 4/14, converted to NSR 4/14. In atypical flutter vs atrial tach in 1/15, DCCV 1/15 to NSR. c. Atrial tach 7/15 with junctional escape 60s.  . Systolic CHF     a.  Probable ischemic cardiomyopathy. Echo (6/10) with EF 40%, mildly dilated LV, moderate MR. ;  b.  echo 3/12: mild LVH, EF 30-35%, IL HK and Lat HK, mod to severe MR, trivial AI, mod LAE  . PNA (pneumonia)     History of   . Mitral regurgitation     a. Moderate by echo 6/10. b. 2-3+ by cath 2006; mod to severe by echo 3/12  . PVC's (premature ventricular contractions)   . Cataract   . Heart murmur   . CKD (chronic kidney disease), stage III   . Skin cancer     Hx of 2008 BCC  . Chronic indwelling Foley catheter     per family, bladder muscles no longer work  .  Depression   . Myocardial infarction   . Basal cell carcinoma   . Paroxysmal atrial tachycardia     a. See details under PAF.  Marland Kitchen Paroxysmal atrial flutter     a. See details under PAF.  Marland Kitchen BPH (benign prostatic hyperplasia)   . Recurrent UTI   . Imbalance   . Carotid stenosis     a. Dopplers 2/15 - 40-59% BICA.    Past Surgical History  Procedure Laterality Date  . Skin cancer excision  2008    shoulder and nose   . Cardioversion N/A 01/02/2013    Procedure: CARDIOVERSION;  Surgeon: Larey Dresser, MD;  Location: [place];  Service: Cardiovascular;  Laterality: N/A;  . Cardioversion N/A 10/23/2013    Procedure: CARDIOVERSION;  Surgeon: Larey Dresser, MD;  Location: [facility] ENDOSCOPY;  Service: Cardiovascular;  Laterality: N/A;  . Hip arthroplasty Right 08/21/2014    Procedure: ARTHROPLASTY BIPOLAR HIP;  Surgeon: Mcarthur Rossetti, MD;  Location: [place];  Service: Orthopedics;  Laterality: Right;    There were no vitals filed for this visit.  Visit Diagnosis:  Abnormality of gait  Weakness generalized      Subjective Assessment - 03/04/15 1021    Subjective A little  pain in RT hip earlier. No pain now.  Feels a little OOB after walking     Patient is accompained by: Interpreter   Currently in Pain? No/denies                         [facility] Adult PT Treatment/Exercise - 03/04/15 1027    Knee/Hip Exercises: Aerobic   Stationary Bike Nu step L5 5 min   Knee/Hip Exercises: Standing   Other Standing Knee Exercises We reviewed HEP with hip flexion /abdcution /extension and ankle PF 1-15 reps with cues for weight shifting and sit to stand  rather than minisquats   Knee/Hip Exercises: Supine   Other Supine Knee Exercises bilateral clam with green band x 15                PT Education - 03/04/15 1105    Education provided Yes   Education Details Encouraged pt to do HEP 2x/day with combo of each sheet as preferred   Person(s) Educated Patient;Other  (comment)  interpreter   Methods Explanation;Demonstration;Tactile cues;Verbal cues;Handout   Comprehension Verbalized understanding          PT Short Term Goals - 03/04/15 1108    PT SHORT TERM GOAL #1   Title Pt will be independent with initial HEP for strengthening and balance (03/16/15)   Status On-going   PT SHORT TERM GOAL #2   Title Pt will improve Berg to 30/56 to decrease fall risk. (03/16/15)   Status On-going   PT SHORT TERM GOAL #3   Title Pt will report </= 4/10 pain with walking of 600 feet to improve mobility within household. 96/22/16)   Status On-going           PT Long Term Goals - 02/16/15 1711    PT LONG TERM GOAL #1   Title Pt/caregiver will be independent with advanced HEP. (04/13/15)   Time 8   Period Weeks   Status New   PT LONG TERM GOAL #2   Title Pt will improve Berg to >/= 36/56 to decrease fall risk. (04/13/15)   Time 8   Period Weeks   Status New   PT LONG TERM GOAL #3   Title Pt will report pain </= 2/10 with walking up to 600' for improved mobility in household. (04/13/15)   Time 8   Period Weeks   Status New   PT LONG TERM GOAL #4   Title Pt will improve gait velocity to >/= 2.0 ft/sec for decreased fall risk. (04/13/15)   Time 8   Period Weeks   Status New   PT LONG TERM GOAL #5   Title Pt will improve hip strength to >/= 4/5 for improved ability with household tasks. (04/13/15)   Time 8   Period Weeks   Status New               Plan - 03/04/15 1107    Clinical Impression Statement He is in need of cues for prooer exercise and encouraged to do more at home to acheive goals. He is continued weak both LE but more so in RT hip   PT Next Visit Plan Progress HEP , balance and strength   PT Home Exercise Plan Needs to do HEP 2x/day   Consulted and Agree with Plan of Care Patient        Problem List Patient Active Problem List   Diagnosis Date Noted  . Chronic systolic CHF (congestive heart  failure) 01/18/2015  . History  of right hip hemiarthroplasty   . Closed right hip fracture   . Fall   . Paroxysmal atrial tachycardia   . Paroxysmal atrial flutter   . CKD (chronic kidney disease), stage III   . Encounter for therapeutic drug monitoring 11/03/2013  . Eyelid cyst 07/09/2012  . Insomnia 11/18/2011  . BPH with urinary obstruction 05/14/2011  . HYPERTENSION, BENIGN 08/04/2009  . MITRAL INSUFFICIENCY 04/04/2009  . SYSTOLIC HEART FAILURE, CHRONIC 04/04/2009  . Paroxysmal atrial fibrillation 02/22/2009  . Unsteady gait 10/13/2008  . INSOMNIA, PERSISTENT 02/11/2008  . Hypothyroidism 07/21/2007  . ANXIETY 07/21/2007  . Coronary atherosclerosis 07/21/2007  . PERIPHERAL VASCULAR DISEASE 07/21/2007  . SKIN CANCER, HX OF 07/21/2007  . COLONIC POLYPS, HX OF 07/21/2007    Darrel Hoover PT 03/04/2015, 11:09 AM  [facility] [address] [place], [place], [address] Phone: [phone]   Fax:  [phone]

## 2015-03-08 ENCOUNTER — Ambulatory Visit: Payer: Medicare Other | Admitting: Physical Therapy

## 2015-03-08 DIAGNOSIS — R269 Unspecified abnormalities of gait and mobility: Secondary | ICD-10-CM | POA: Diagnosis not present

## 2015-03-08 DIAGNOSIS — R531 Weakness: Secondary | ICD-10-CM

## 2015-03-08 DIAGNOSIS — R293 Abnormal posture: Secondary | ICD-10-CM

## 2015-03-08 NOTE — Therapy (Signed)
[place], [place], [address] Phone: [phone]   Fax:  [phone]  Physical Therapy Treatment  Patient Details  Name: [name] MRN: [id number] Date of Birth: 05-08-1926 Referring Provider:  Cassandria Anger, MD  Encounter Date: 03/08/2015      PT End of Session - 03/08/15 2248    Visit Number 4   Number of Visits 16   Date for PT Re-Evaluation 04/13/15   Authorization Type Medicare/Medicaid, KX at Visit 12 due to prior HHPT   PT Start Time 1148   PT Stop Time 1237   PT Time Calculation (min) 49 min      Past Medical History  Diagnosis Date  . CAD (coronary artery disease)     a. LHC 4/06 with 60% pLAD, 40% ramus, 60% mRCA, and 90% mPDA. No intervention. Inferior akinesis on LV-gram was suggestive of possible inferior MI with recanalization.  . Hyperlipidemia     a. Myalgias with Lipitor and Crestor.  . Hypothyroidism   . Peripheral vascular disease   . Anxiety   . Hx of colonic polyps   . Paroxysmal atrial fibrillation 02/2009     a. With RVR tx with amiodarone returning to NSR 02/2009. b. Had atypical aflutter vs atrial tach 4/14, converted to NSR 4/14. In atypical flutter vs atrial tach in 1/15, DCCV 1/15 to NSR. c. Atrial tach 7/15 with junctional escape 60s.  . Systolic CHF     a.  Probable ischemic cardiomyopathy. Echo (6/10) with EF 40%, mildly dilated LV, moderate MR. ;  b.  echo 3/12: mild LVH, EF 30-35%, IL HK and Lat HK, mod to severe MR, trivial AI, mod LAE  . PNA (pneumonia)     History of   . Mitral regurgitation     a. Moderate by echo 6/10. b. 2-3+ by cath 2006; mod to severe by echo 3/12  . PVC's (premature ventricular contractions)   . Cataract   . Heart murmur   . CKD (chronic kidney disease), stage III   . Skin cancer     Hx of 2008 BCC  . Chronic indwelling Foley catheter     per family, bladder muscles no longer work  . Depression   . Myocardial infarction    . Basal cell carcinoma   . Paroxysmal atrial tachycardia     a. See details under PAF.  Marland Kitchen Paroxysmal atrial flutter     a. See details under PAF.  Marland Kitchen BPH (benign prostatic hyperplasia)   . Recurrent UTI   . Imbalance   . Carotid stenosis     a. Dopplers 2/15 - 40-59% BICA.    Past Surgical History  Procedure Laterality Date  . Skin cancer excision  2008    shoulder and nose   . Cardioversion N/A 01/02/2013    Procedure: CARDIOVERSION;  Surgeon: Larey Dresser, MD;  Location: [place];  Service: Cardiovascular;  Laterality: N/A;  . Cardioversion N/A 10/23/2013    Procedure: CARDIOVERSION;  Surgeon: Larey Dresser, MD;  Location: [facility] ENDOSCOPY;  Service: Cardiovascular;  Laterality: N/A;  . Hip arthroplasty Right 08/21/2014    Procedure: ARTHROPLASTY BIPOLAR HIP;  Surgeon: Mcarthur Rossetti, MD;  Location: [place];  Service: Orthopedics;  Laterality: Right;    There were no vitals filed for this visit.  Visit Diagnosis:  Abnormality of gait  Weakness generalized  Abnormal posture      Subjective Assessment - 03/08/15 1250    Subjective No pain  now. Pt reports some pain upon waking along lateral thigh and right hip as well as medial right knee that resolves with movement.    Currently in Pain? No/denies                         [facility] Adult PT Treatment/Exercise - 03/08/15 1153    Knee/Hip Exercises: Aerobic   Stationary Bike Nu step L5 6 min   Knee/Hip Exercises: Standing   Wall Squat 5 reps   Wall Squat Limitations sit-stand without UE support  CGA   Knee/Hip Exercises: Seated   Long Arc Quad Strengthening;Right;Left;1 set;15 reps   Long Arc Quad Weight 4 lbs.   Other Seated Knee Exercises hip flexion 4 pounds x15 RT and Lt x 15 each   Other Seated Knee Exercises seated DF with 4# x 20 bilateral   Knee/Hip Exercises: Supine   Hip Adduction Isometric Strengthening;Both;1 set;15 reps  ball squeeze   Bridges Strengthening;Both;1 set;15 reps    Straight Leg Raises AROM;Right;Left;15 reps   Other Supine Knee Exercises supine clam with greenband x 25   Knee/Hip Exercises: Sidelying   Clams x 20 right                PT Education - 03/08/15 1240    Education provided Yes   Education Details SLR, ball squeeze, clam with green band   Person(s) Educated Patient  interpreter   Methods Explanation;Handout   Comprehension Verbalized understanding          PT Short Term Goals - 03/04/15 1108    PT SHORT TERM GOAL #1   Title Pt will be independent with initial HEP for strengthening and balance (03/16/15)   Status On-going   PT SHORT TERM GOAL #2   Title Pt will improve Berg to 30/56 to decrease fall risk. (03/16/15)   Status On-going   PT SHORT TERM GOAL #3   Title Pt will report </= 4/10 pain with walking of 600 feet to improve mobility within household. 96/22/16)   Status On-going           PT Long Term Goals - 02/16/15 1711    PT LONG TERM GOAL #1   Title Pt/caregiver will be independent with advanced HEP. (04/13/15)   Time 8   Period Weeks   Status New   PT LONG TERM GOAL #2   Title Pt will improve Berg to >/= 36/56 to decrease fall risk. (04/13/15)   Time 8   Period Weeks   Status New   PT LONG TERM GOAL #3   Title Pt will report pain </= 2/10 with walking up to 600' for improved mobility in household. (04/13/15)   Time 8   Period Weeks   Status New   PT LONG TERM GOAL #4   Title Pt will improve gait velocity to >/= 2.0 ft/sec for decreased fall risk. (04/13/15)   Time 8   Period Weeks   Status New   PT LONG TERM GOAL #5   Title Pt will improve hip strength to >/= 4/5 for improved ability with household tasks. (04/13/15)   Time 8   Period Weeks   Status New               Plan - 03/08/15 1245    Clinical Impression Statement Pt reports walking 15 minutes at a time at home along 26ft corridor. He reports someone is always with him because he continues to have light headedness. He  is able to  perform 6 sit to stands with 1 rest break without UE support and CGA. Pt instructed in LE strengthening exercises on mat table. He is Mod I with transfers. Added ball squeeze, supine clams  with green band as well as SLRs to HEP.    PT Next Visit Plan Balance, check walking distance to address goals.         Problem List Patient Active Problem List   Diagnosis Date Noted  . Chronic systolic CHF (congestive heart failure) 01/18/2015  . History of right hip hemiarthroplasty   . Closed right hip fracture   . Fall   . Paroxysmal atrial tachycardia   . Paroxysmal atrial flutter   . CKD (chronic kidney disease), stage III   . Encounter for therapeutic drug monitoring 11/03/2013  . Eyelid cyst 07/09/2012  . Insomnia 11/18/2011  . BPH with urinary obstruction 05/14/2011  . HYPERTENSION, BENIGN 08/04/2009  . MITRAL INSUFFICIENCY 04/04/2009  . SYSTOLIC HEART FAILURE, CHRONIC 04/04/2009  . Paroxysmal atrial fibrillation 02/22/2009  . Unsteady gait 10/13/2008  . INSOMNIA, PERSISTENT 02/11/2008  . Hypothyroidism 07/21/2007  . ANXIETY 07/21/2007  . Coronary atherosclerosis 07/21/2007  . PERIPHERAL VASCULAR DISEASE 07/21/2007  . SKIN CANCER, HX OF 07/21/2007  . COLONIC POLYPS, HX OF 07/21/2007    Dorene Ar, PTA 03/08/2015, 12:52 PM  [facility] [address] [place], [place], [address] Phone: [phone]   Fax:  [phone]

## 2015-03-08 NOTE — Patient Instructions (Signed)
Strengthening: Hip Adduction - Isometric   With ball or folded pillow between knees, squeeze knees together. Hold __5__ seconds. Repeat _10___ times per set. Do _2___ sets per session. Do _2___ sessions per day.  http://orth.exer.us/612   Copyright  VHI. All rights reserved.  Straight Leg Raise   Tighten stomach and slowly raise locked right leg _12___ inches from floor. Repeat __15__ times per set. Do __1__ sets per session. Do _2___ sessions per day.  http://orth.exer.us/1102   Copyright  VHI. All rights reserved.  External Rotation: Hip - Knees Apart (Hook-Lying)   Lie with hips and knees bent, band tied just above knees. Pull knees apart. Hold for __5_ seconds. Rest for __1_ seconds. Repeat _15-20__ times. Do __2_ times a day.  Copyright  VHI. All rights reserved.  Adduction: Hip - Knees Together (Hook-Lying)   Lie with hips and knees bent, towel roll between knees. Push knees together. Hold for _5__ seconds. Rest for _1__ seconds. Repeat _10-20__ times. Do _2__ times a day.   Copyright  VHI. All rights reserved.

## 2015-03-09 ENCOUNTER — Ambulatory Visit (INDEPENDENT_AMBULATORY_CARE_PROVIDER_SITE_OTHER): Payer: Medicare Other | Admitting: *Deleted

## 2015-03-09 DIAGNOSIS — I4891 Unspecified atrial fibrillation: Secondary | ICD-10-CM

## 2015-03-09 DIAGNOSIS — Z5181 Encounter for therapeutic drug level monitoring: Secondary | ICD-10-CM | POA: Diagnosis not present

## 2015-03-09 DIAGNOSIS — I480 Paroxysmal atrial fibrillation: Secondary | ICD-10-CM | POA: Diagnosis not present

## 2015-03-09 LAB — POCT INR: INR: 2.1

## 2015-03-11 ENCOUNTER — Ambulatory Visit: Payer: Medicare Other

## 2015-03-11 ENCOUNTER — Encounter: Payer: Self-pay | Admitting: Cardiology

## 2015-03-11 DIAGNOSIS — R269 Unspecified abnormalities of gait and mobility: Secondary | ICD-10-CM | POA: Diagnosis not present

## 2015-03-11 DIAGNOSIS — R2689 Other abnormalities of gait and mobility: Secondary | ICD-10-CM

## 2015-03-11 NOTE — Therapy (Signed)
[place], [place], [address] Phone: [phone]   Fax:  [phone]  Physical Therapy Treatment  Patient Details  Name: [name] MRN: [id number] Date of Birth: 1926-03-13 Referring Provider:  Cassandria Anger, MD  Encounter Date: 03/11/2015      PT End of Session - 03/11/15 1016    Visit Number 5   Number of Visits 16   Date for PT Re-Evaluation 04/13/15   PT Start Time 0930   PT Stop Time 1015   PT Time Calculation (min) 45 min   Activity Tolerance Patient tolerated treatment well   Behavior During Therapy [facility] for tasks assessed/performed      Past Medical History  Diagnosis Date  . CAD (coronary artery disease)     a. LHC 4/06 with 60% pLAD, 40% ramus, 60% mRCA, and 90% mPDA. No intervention. Inferior akinesis on LV-gram was suggestive of possible inferior MI with recanalization.  . Hyperlipidemia     a. Myalgias with Lipitor and Crestor.  . Hypothyroidism   . Peripheral vascular disease   . Anxiety   . Hx of colonic polyps   . Paroxysmal atrial fibrillation 02/2009     a. With RVR tx with amiodarone returning to NSR 02/2009. b. Had atypical aflutter vs atrial tach 4/14, converted to NSR 4/14. In atypical flutter vs atrial tach in 1/15, DCCV 1/15 to NSR. c. Atrial tach 7/15 with junctional escape 60s.  . Systolic CHF     a.  Probable ischemic cardiomyopathy. Echo (6/10) with EF 40%, mildly dilated LV, moderate MR. ;  b.  echo 3/12: mild LVH, EF 30-35%, IL HK and Lat HK, mod to severe MR, trivial AI, mod LAE  . PNA (pneumonia)     History of   . Mitral regurgitation     a. Moderate by echo 6/10. b. 2-3+ by cath 2006; mod to severe by echo 3/12  . PVC's (premature ventricular contractions)   . Cataract   . Heart murmur   . CKD (chronic kidney disease), stage III   . Skin cancer     Hx of 2008 BCC  . Chronic indwelling Foley catheter     per family, bladder muscles no longer work  .  Depression   . Myocardial infarction   . Basal cell carcinoma   . Paroxysmal atrial tachycardia     a. See details under PAF.  Marland Kitchen Paroxysmal atrial flutter     a. See details under PAF.  Marland Kitchen BPH (benign prostatic hyperplasia)   . Recurrent UTI   . Imbalance   . Carotid stenosis     a. Dopplers 2/15 - 40-59% BICA.    Past Surgical History  Procedure Laterality Date  . Skin cancer excision  2008    shoulder and nose   . Cardioversion N/A 01/02/2013    Procedure: CARDIOVERSION;  Surgeon: Larey Dresser, MD;  Location: [place];  Service: Cardiovascular;  Laterality: N/A;  . Cardioversion N/A 10/23/2013    Procedure: CARDIOVERSION;  Surgeon: Larey Dresser, MD;  Location: [facility] ENDOSCOPY;  Service: Cardiovascular;  Laterality: N/A;  . Hip arthroplasty Right 08/21/2014    Procedure: ARTHROPLASTY BIPOLAR HIP;  Surgeon: Mcarthur Rossetti, MD;  Location: [place];  Service: Orthopedics;  Laterality: Right;    There were no vitals filed for this visit.  Visit Diagnosis:  Abnormality of gait  Difficulty balancing when standing      Subjective Assessment - 03/11/15 1012  Subjective No pain today. fatigued after session.    Patient is accompained by: Interpreter   Currently in Pain? No/denies                         [facility] Adult PT Treatment/Exercise - 03/11/15 1012    Knee/Hip Exercises: Aerobic   Stationary Bike Nu step L 6.5 min with 2 15 sec bouts of increased effort   Knee/Hip Exercises: Standing   Functional Squat 1 set;10 reps  sit to stand.    Functional Squat Limitations He keeps his weight psoterior apparently due to fear of falling forward. we then worked on [employer] shifting forward at counter  both LE with sall sway forward and with one foot in front in step stand.   LT and RT . Also with tactile cues facilitating load to RT leg with elevation of body for stance suppor . Also did this walking with CG and belt and cuse verbal and tactile.    Knee/Hip  Exercises: Supine   Other Supine Knee Exercises Reviewed HEP issued last session and after set up he was able to do correctly.                   PT Short Term Goals - 03/04/15 1108    PT SHORT TERM GOAL #1   Title Pt will be independent with initial HEP for strengthening and balance (03/16/15)   Status On-going   PT SHORT TERM GOAL #2   Title Pt will improve Berg to 30/56 to decrease fall risk. (03/16/15)   Status On-going   PT SHORT TERM GOAL #3   Title Pt will report </= 4/10 pain with walking of 600 feet to improve mobility within household. 96/22/16)   Status On-going           PT Long Term Goals - 02/16/15 1711    PT LONG TERM GOAL #1   Title Pt/caregiver will be independent with advanced HEP. (04/13/15)   Time 8   Period Weeks   Status New   PT LONG TERM GOAL #2   Title Pt will improve Berg to >/= 36/56 to decrease fall risk. (04/13/15)   Time 8   Period Weeks   Status New   PT LONG TERM GOAL #3   Title Pt will report pain </= 2/10 with walking up to 600' for improved mobility in household. (04/13/15)   Time 8   Period Weeks   Status New   PT LONG TERM GOAL #4   Title Pt will improve gait velocity to >/= 2.0 ft/sec for decreased fall risk. (04/13/15)   Time 8   Period Weeks   Status New   PT LONG TERM GOAL #5   Title Pt will improve hip strength to >/= 4/5 for improved ability with household tasks. (04/13/15)   Time 8   Period Weeks   Status New               Plan - 03/11/15 1017    Clinical Impression Statement Fatigue at end of session. His balance with posterior lean and decreased tolerance to weight to Rt leg makes balance poor and fall risk.    PT Next Visit Plan Balance, check walking distance to address goals. lcosed chain strength if able and make him work harder in very short bouts to inprove strength and endurance   Consulted and Agree with Plan of Care Patient        Problem List  Patient Active Problem List   Diagnosis Date Noted   . Chronic systolic CHF (congestive heart failure) 01/18/2015  . History of right hip hemiarthroplasty   . Closed right hip fracture   . Fall   . Paroxysmal atrial tachycardia   . Paroxysmal atrial flutter   . CKD (chronic kidney disease), stage III   . Encounter for therapeutic drug monitoring 11/03/2013  . Eyelid cyst 07/09/2012  . Insomnia 11/18/2011  . BPH with urinary obstruction 05/14/2011  . HYPERTENSION, BENIGN 08/04/2009  . MITRAL INSUFFICIENCY 04/04/2009  . SYSTOLIC HEART FAILURE, CHRONIC 04/04/2009  . Paroxysmal atrial fibrillation 02/22/2009  . Unsteady gait 10/13/2008  . INSOMNIA, PERSISTENT 02/11/2008  . Hypothyroidism 07/21/2007  . ANXIETY 07/21/2007  . Coronary atherosclerosis 07/21/2007  . PERIPHERAL VASCULAR DISEASE 07/21/2007  . SKIN CANCER, HX OF 07/21/2007  . COLONIC POLYPS, HX OF 07/21/2007    Darrel Hoover PT 03/11/2015, 10:20 AM  [facility] [address] [place], [place], [address] Phone: [phone]   Fax:  [phone]

## 2015-03-12 ENCOUNTER — Other Ambulatory Visit: Payer: Self-pay | Admitting: Family Medicine

## 2015-03-14 NOTE — Telephone Encounter (Signed)
Advise on refill. 

## 2015-03-15 ENCOUNTER — Ambulatory Visit: Payer: Medicare Other

## 2015-03-15 DIAGNOSIS — R269 Unspecified abnormalities of gait and mobility: Secondary | ICD-10-CM | POA: Diagnosis not present

## 2015-03-15 DIAGNOSIS — R2689 Other abnormalities of gait and mobility: Secondary | ICD-10-CM

## 2015-03-15 DIAGNOSIS — R531 Weakness: Secondary | ICD-10-CM

## 2015-03-15 NOTE — Patient Instructions (Signed)
Discussed need for RT LE strength to be able to safely walk without device .

## 2015-03-15 NOTE — Therapy (Signed)
[place], [place], [address] Phone: [phone]   Fax:  [phone]  Physical Therapy Treatment  Patient Details  Name: [name] MRN: [id number] Date of Birth: 24-Nov-1925 Referring Provider:  Cassandria Anger, MD  Encounter Date: 03/15/2015      PT End of Session - 03/15/15 1225    Visit Number 6   Number of Visits 16   Date for PT Re-Evaluation 04/13/15   PT Start Time 1152   PT Stop Time 1223   PT Time Calculation (min) 31 min   Activity Tolerance Patient tolerated treatment well;Patient limited by fatigue   Behavior During Therapy [facility] for tasks assessed/performed      Past Medical History  Diagnosis Date  . CAD (coronary artery disease)     a. LHC 4/06 with 60% pLAD, 40% ramus, 60% mRCA, and 90% mPDA. No intervention. Inferior akinesis on LV-gram was suggestive of possible inferior MI with recanalization.  . Hyperlipidemia     a. Myalgias with Lipitor and Crestor.  . Hypothyroidism   . Peripheral vascular disease   . Anxiety   . Hx of colonic polyps   . Paroxysmal atrial fibrillation 02/2009     a. With RVR tx with amiodarone returning to NSR 02/2009. b. Had atypical aflutter vs atrial tach 4/14, converted to NSR 4/14. In atypical flutter vs atrial tach in 1/15, DCCV 1/15 to NSR. c. Atrial tach 7/15 with junctional escape 60s.  . Systolic CHF     a.  Probable ischemic cardiomyopathy. Echo (6/10) with EF 40%, mildly dilated LV, moderate MR. ;  b.  echo 3/12: mild LVH, EF 30-35%, IL HK and Lat HK, mod to severe MR, trivial AI, mod LAE  . PNA (pneumonia)     History of   . Mitral regurgitation     a. Moderate by echo 6/10. b. 2-3+ by cath 2006; mod to severe by echo 3/12  . PVC's (premature ventricular contractions)   . Cataract   . Heart murmur   . CKD (chronic kidney disease), stage III   . Skin cancer     Hx of 2008 BCC  . Chronic indwelling Foley catheter     per family, bladder  muscles no longer work  . Depression   . Myocardial infarction   . Basal cell carcinoma   . Paroxysmal atrial tachycardia     a. See details under PAF.  Marland Kitchen Paroxysmal atrial flutter     a. See details under PAF.  Marland Kitchen BPH (benign prostatic hyperplasia)   . Recurrent UTI   . Imbalance   . Carotid stenosis     a. Dopplers 2/15 - 40-59% BICA.    Past Surgical History  Procedure Laterality Date  . Skin cancer excision  2008    shoulder and nose   . Cardioversion N/A 01/02/2013    Procedure: CARDIOVERSION;  Surgeon: Larey Dresser, MD;  Location: [place];  Service: Cardiovascular;  Laterality: N/A;  . Cardioversion N/A 10/23/2013    Procedure: CARDIOVERSION;  Surgeon: Larey Dresser, MD;  Location: [facility] ENDOSCOPY;  Service: Cardiovascular;  Laterality: N/A;  . Hip arthroplasty Right 08/21/2014    Procedure: ARTHROPLASTY BIPOLAR HIP;  Surgeon: Mcarthur Rossetti, MD;  Location: [place];  Service: Orthopedics;  Laterality: Right;    There were no vitals filed for this visit.  Visit Diagnosis:  Abnormality of gait  Difficulty balancing when standing  Weakness generalized      Subjective Assessment -  03/15/15 1213    Patient is accompained by: Interpreter                         [facility] Adult PT Treatment/Exercise - 03/15/15 1157    Knee/Hip Exercises: Aerobic   Stationary Bike Nustep LE only L 4 5 min   Knee/Hip Exercises: Standing   Forward Step Up Right;2 sets;10 reps   Forward Step Up Limitations With mild assist from PT mostly to contact guard for LOB but he was not able to extend RT LE well so went to 4 inch step x 6 then lateral step ups 4 inch x 10 with contact guard.    Functional Squat 1 set  12 sit to stand with i hand on walker   Knee/Hip Exercises: Supine   Bridges Strengthening;Both;1 set;15 reps   Other Supine Knee/Hip Exercises Manually resisted abduciton x 15 with Verbal cues as needed to max effort. Marchng x 15 RT and LT. RT hip etension  with leg off mat on 6 inch box. x 15 reps/.                 PT Education - 03/15/15 1225    Education provided Yes   Education Details need to increase strength   Person(s) Educated Patient  interpreter   Methods Explanation;Demonstration   Comprehension Verbalized understanding          PT Short Term Goals - 03/15/15 1227    PT SHORT TERM GOAL #1   Title Pt will be independent with initial HEP for strengthening and balance (03/16/15)   Status On-going   PT SHORT TERM GOAL #2   Title Pt will improve Berg to 30/56 to decrease fall risk. (03/16/15)   Status On-going   PT SHORT TERM GOAL #3   Title Pt will report </= 4/10 pain with walking of 600 feet to improve mobility within household. 96/22/16)   Status Achieved           PT Long Term Goals - 02/16/15 1711    PT LONG TERM GOAL #1   Title Pt/caregiver will be independent with advanced HEP. (04/13/15)   Time 8   Period Weeks   Status New   PT LONG TERM GOAL #2   Title Pt will improve Berg to >/= 36/56 to decrease fall risk. (04/13/15)   Time 8   Period Weeks   Status New   PT LONG TERM GOAL #3   Title Pt will report pain </= 2/10 with walking up to 600' for improved mobility in household. (04/13/15)   Time 8   Period Weeks   Status New   PT LONG TERM GOAL #4   Title Pt will improve gait velocity to >/= 2.0 ft/sec for decreased fall risk. (04/13/15)   Time 8   Period Weeks   Status New   PT LONG TERM GOAL #5   Title Pt will improve hip strength to >/= 4/5 for improved ability with household tasks. (04/13/15)   Time 8   Period Weeks   Status New               Plan - 03/15/15 1226    Clinical Impression Statement Fatigue but no pain. He contniues with weakness in RT hip/leg and will not be safe off walker with improved strength/support in RT LT.    PT Next Visit Plan Balance, check walking distance to address goals. lcosed chain strength if able and make him work  harder in very short bouts to inprove  strength and endurance   Consulted and Agree with Plan of Care Patient        Problem List Patient Active Problem List   Diagnosis Date Noted  . Chronic systolic CHF (congestive heart failure) 01/18/2015  . History of right hip hemiarthroplasty   . Closed right hip fracture   . Fall   . Paroxysmal atrial tachycardia   . Paroxysmal atrial flutter   . CKD (chronic kidney disease), stage III   . Encounter for therapeutic drug monitoring 11/03/2013  . Eyelid cyst 07/09/2012  . Insomnia 11/18/2011  . BPH with urinary obstruction 05/14/2011  . HYPERTENSION, BENIGN 08/04/2009  . MITRAL INSUFFICIENCY 04/04/2009  . SYSTOLIC HEART FAILURE, CHRONIC 04/04/2009  . Paroxysmal atrial fibrillation 02/22/2009  . Unsteady gait 10/13/2008  . INSOMNIA, PERSISTENT 02/11/2008  . Hypothyroidism 07/21/2007  . ANXIETY 07/21/2007  . Coronary atherosclerosis 07/21/2007  . PERIPHERAL VASCULAR DISEASE 07/21/2007  . SKIN CANCER, HX OF 07/21/2007  . COLONIC POLYPS, HX OF 07/21/2007    Darrel Hoover PT 03/15/2015, 12:28 PM  [place] [facility] [address] [place], [place], [address] Phone: [phone]   Fax:  [phone]

## 2015-03-18 ENCOUNTER — Ambulatory Visit: Payer: Medicare Other

## 2015-03-18 DIAGNOSIS — R531 Weakness: Secondary | ICD-10-CM

## 2015-03-18 DIAGNOSIS — R269 Unspecified abnormalities of gait and mobility: Secondary | ICD-10-CM | POA: Diagnosis not present

## 2015-03-18 DIAGNOSIS — R2689 Other abnormalities of gait and mobility: Secondary | ICD-10-CM

## 2015-03-18 NOTE — Therapy (Signed)
[place], [place], [address] Phone: [phone]   Fax:  [phone]  Physical Therapy Treatment  Patient Details  Name: [name] MRN: [id number] Date of Birth: 1926/07/21 Referring Provider:  Cassandria Anger, MD  Encounter Date: 03/18/2015      PT End of Session - 03/18/15 1045    Visit Number 7   Number of Visits 16   Date for PT Re-Evaluation 04/13/15   PT Start Time 1015   PT Stop Time 1055   PT Time Calculation (min) 40 min   Activity Tolerance Patient limited by fatigue   Behavior During Therapy [facility] for tasks assessed/performed      Past Medical History  Diagnosis Date  . CAD (coronary artery disease)     a. LHC 4/06 with 60% pLAD, 40% ramus, 60% mRCA, and 90% mPDA. No intervention. Inferior akinesis on LV-gram was suggestive of possible inferior MI with recanalization.  . Hyperlipidemia     a. Myalgias with Lipitor and Crestor.  . Hypothyroidism   . Peripheral vascular disease   . Anxiety   . Hx of colonic polyps   . Paroxysmal atrial fibrillation 02/2009     a. With RVR tx with amiodarone returning to NSR 02/2009. b. Had atypical aflutter vs atrial tach 4/14, converted to NSR 4/14. In atypical flutter vs atrial tach in 1/15, DCCV 1/15 to NSR. c. Atrial tach 7/15 with junctional escape 60s.  . Systolic CHF     a.  Probable ischemic cardiomyopathy. Echo (6/10) with EF 40%, mildly dilated LV, moderate MR. ;  b.  echo 3/12: mild LVH, EF 30-35%, IL HK and Lat HK, mod to severe MR, trivial AI, mod LAE  . PNA (pneumonia)     History of   . Mitral regurgitation     a. Moderate by echo 6/10. b. 2-3+ by cath 2006; mod to severe by echo 3/12  . PVC's (premature ventricular contractions)   . Cataract   . Heart murmur   . CKD (chronic kidney disease), stage III   . Skin cancer     Hx of 2008 BCC  . Chronic indwelling Foley catheter     per family, bladder muscles no longer work  .  Depression   . Myocardial infarction   . Basal cell carcinoma   . Paroxysmal atrial tachycardia     a. See details under PAF.  Marland Kitchen Paroxysmal atrial flutter     a. See details under PAF.  Marland Kitchen BPH (benign prostatic hyperplasia)   . Recurrent UTI   . Imbalance   . Carotid stenosis     a. Dopplers 2/15 - 40-59% BICA.    Past Surgical History  Procedure Laterality Date  . Skin cancer excision  2008    shoulder and nose   . Cardioversion N/A 01/02/2013    Procedure: CARDIOVERSION;  Surgeon: Larey Dresser, MD;  Location: [place];  Service: Cardiovascular;  Laterality: N/A;  . Cardioversion N/A 10/23/2013    Procedure: CARDIOVERSION;  Surgeon: Larey Dresser, MD;  Location: [facility] ENDOSCOPY;  Service: Cardiovascular;  Laterality: N/A;  . Hip arthroplasty Right 08/21/2014    Procedure: ARTHROPLASTY BIPOLAR HIP;  Surgeon: Mcarthur Rossetti, MD;  Location: [place];  Service: Orthopedics;  Laterality: Right;    There were no vitals filed for this visit.  Visit Diagnosis:  Abnormality of gait  Difficulty balancing when standing  Weakness generalized      Subjective Assessment - 03/18/15 1025  Subjective No complaints.     Patient is accompained by: Interpreter   Currently in Pain? No/denies                         [facility] Adult PT Treatment/Exercise - 03/18/15 1026    Ambulation/Gait   Gait Comments Walked 500 feet with HR max 58 and O2 96 minimally. He reported fatigue and walked more bent and decreases step length for last 50 feet due to fatigue.    Knee/Hip Exercises: Aerobic   Stationary Bike Nustep LE only L 5 5 min   Knee/Hip Exercises: Supine   Bridges Strengthening;Both;1 set;20 reps   Straight Leg Raises AROM;Right;Left;15 reps   Knee/Hip Exercises: Sidelying   Hip ABduction AROM;Right;Left;1 set;15 reps   Hip ABduction Limitations Verbal and tactile cues for correct alignment of leg RT and LT    Clams x15 RT with verbal and tactile cues RT and LT                   PT Short Term Goals - 03/18/15 1047    PT SHORT TERM GOAL #3   Baseline No pain but fatigue limited   Status Partially Met           PT Long Term Goals - 02/16/15 1711    PT LONG TERM GOAL #1   Title Pt/caregiver will be independent with advanced HEP. (04/13/15)   Time 8   Period Weeks   Status New   PT LONG TERM GOAL #2   Title Pt will improve Berg to >/= 36/56 to decrease fall risk. (04/13/15)   Time 8   Period Weeks   Status New   PT LONG TERM GOAL #3   Title Pt will report pain </= 2/10 with walking up to 600' for improved mobility in household. (04/13/15)   Time 8   Period Weeks   Status New   PT LONG TERM GOAL #4   Title Pt will improve gait velocity to >/= 2.0 ft/sec for decreased fall risk. (04/13/15)   Time 8   Period Weeks   Status New   PT LONG TERM GOAL #5   Title Pt will improve hip strength to >/= 4/5 for improved ability with household tasks. (04/13/15)   Time 8   Period Weeks   Status New               Plan - 03/18/15 1045    Clinical Impression Statement Fatigue but no pain. He will do exercise as asked but comments of difficulty and by 554fet he was  noticabley tired with gait pattern change   PT Next Visit Plan strength and balance. walk for distance as tolerated   Consulted and Agree with Plan of Care Patient        Problem List Patient Active Problem List   Diagnosis Date Noted  . Chronic systolic CHF (congestive heart failure) 01/18/2015  . History of right hip hemiarthroplasty   . Closed right hip fracture   . Fall   . Paroxysmal atrial tachycardia   . Paroxysmal atrial flutter   . CKD (chronic kidney disease), stage III   . Encounter for therapeutic drug monitoring 11/03/2013  . Eyelid cyst 07/09/2012  . Insomnia 11/18/2011  . BPH with urinary obstruction 05/14/2011  . HYPERTENSION, BENIGN 08/04/2009  . MITRAL INSUFFICIENCY 04/04/2009  . SYSTOLIC HEART FAILURE, CHRONIC 04/04/2009  . Paroxysmal  atrial fibrillation 02/22/2009  . Unsteady gait 10/13/2008  .  INSOMNIA, PERSISTENT 02/11/2008  . Hypothyroidism 07/21/2007  . ANXIETY 07/21/2007  . Coronary atherosclerosis 07/21/2007  . PERIPHERAL VASCULAR DISEASE 07/21/2007  . SKIN CANCER, HX OF 07/21/2007  . COLONIC POLYPS, HX OF 07/21/2007    Darrel Hoover PT 03/18/2015, 10:48 AM  [facility] [address] [place], [place], [address] Phone: [phone]   Fax:  [phone]

## 2015-03-21 ENCOUNTER — Ambulatory Visit: Payer: Medicare Other | Admitting: Physical Therapy

## 2015-03-21 DIAGNOSIS — R531 Weakness: Secondary | ICD-10-CM

## 2015-03-21 DIAGNOSIS — R269 Unspecified abnormalities of gait and mobility: Secondary | ICD-10-CM | POA: Diagnosis not present

## 2015-03-21 DIAGNOSIS — R293 Abnormal posture: Secondary | ICD-10-CM

## 2015-03-21 DIAGNOSIS — R2689 Other abnormalities of gait and mobility: Secondary | ICD-10-CM

## 2015-03-21 NOTE — Therapy (Signed)
[place], [place], [address] Phone: [phone]   Fax:  [phone]  Physical Therapy Treatment  Patient Details  Name: [name] MRN: [id number] Date of Birth: 05-01-1926 Referring Provider:  Cassandria Anger, MD  Encounter Date: 03/21/2015      PT End of Session - 03/21/15 1307    Visit Number 8   Number of Visits 16   Date for PT Re-Evaluation 04/13/15   Authorization Type Medicare/Medicaid, KX at Visit 12 due to prior HHPT   PT Start Time 1103   PT Stop Time 1146   PT Time Calculation (min) 43 min      Past Medical History  Diagnosis Date  . CAD (coronary artery disease)     a. LHC 4/06 with 60% pLAD, 40% ramus, 60% mRCA, and 90% mPDA. No intervention. Inferior akinesis on LV-gram was suggestive of possible inferior MI with recanalization.  . Hyperlipidemia     a. Myalgias with Lipitor and Crestor.  . Hypothyroidism   . Peripheral vascular disease   . Anxiety   . Hx of colonic polyps   . Paroxysmal atrial fibrillation 02/2009     a. With RVR tx with amiodarone returning to NSR 02/2009. b. Had atypical aflutter vs atrial tach 4/14, converted to NSR 4/14. In atypical flutter vs atrial tach in 1/15, DCCV 1/15 to NSR. c. Atrial tach 7/15 with junctional escape 60s.  . Systolic CHF     a.  Probable ischemic cardiomyopathy. Echo (6/10) with EF 40%, mildly dilated LV, moderate MR. ;  b.  echo 3/12: mild LVH, EF 30-35%, IL HK and Lat HK, mod to severe MR, trivial AI, mod LAE  . PNA (pneumonia)     History of   . Mitral regurgitation     a. Moderate by echo 6/10. b. 2-3+ by cath 2006; mod to severe by echo 3/12  . PVC's (premature ventricular contractions)   . Cataract   . Heart murmur   . CKD (chronic kidney disease), stage III   . Skin cancer     Hx of 2008 BCC  . Chronic indwelling Foley catheter     per family, bladder muscles no longer work  . Depression   . Myocardial infarction    . Basal cell carcinoma   . Paroxysmal atrial tachycardia     a. See details under PAF.  Marland Kitchen Paroxysmal atrial flutter     a. See details under PAF.  Marland Kitchen BPH (benign prostatic hyperplasia)   . Recurrent UTI   . Imbalance   . Carotid stenosis     a. Dopplers 2/15 - 40-59% BICA.    Past Surgical History  Procedure Laterality Date  . Skin cancer excision  2008    shoulder and nose   . Cardioversion N/A 01/02/2013    Procedure: CARDIOVERSION;  Surgeon: Larey Dresser, MD;  Location: [place];  Service: Cardiovascular;  Laterality: N/A;  . Cardioversion N/A 10/23/2013    Procedure: CARDIOVERSION;  Surgeon: Larey Dresser, MD;  Location: [facility] ENDOSCOPY;  Service: Cardiovascular;  Laterality: N/A;  . Hip arthroplasty Right 08/21/2014    Procedure: ARTHROPLASTY BIPOLAR HIP;  Surgeon: Mcarthur Rossetti, MD;  Location: [place];  Service: Orthopedics;  Laterality: Right;    There were no vitals filed for this visit.  Visit Diagnosis:  Abnormality of gait  Difficulty balancing when standing  Weakness generalized  Abnormal posture      Subjective Assessment - 03/21/15 1131  Subjective little lightheaded from not sleeping well last night. discomfort R lat thigh. patient talked of difficulty breathing    Patient is accompained by: Interpreter   Limitations Walking;Standing   Currently in Pain? No/denies                         [facility] Adult PT Treatment/Exercise - 03/21/15 1112    Ambulation/Gait   Ambulation/Gait Yes   Ambulation/Gait Assistance 4: Min guard   Ambulation Distance (Feet) 99 Feet  rest break required then 50 ft    Assistive device Rolling walker   High Level Balance   High Level Balance Activities Side stepping;Backward walking   High Level Balance Comments 2 passes each in parallel bars   Lumbar Exercises: Seated   Sit to Stand 20 reps  10 reps from elevated seat   Sit to Stand Limitations cues for proper technique; no UE;    Knee/Hip  Exercises: Aerobic   Stationary Bike Nustep LE only L 5 5 min   Knee/Hip Exercises: Standing   Other Standing Knee Exercises narrow base standing, no UE support with cervical and trunk movements; eyes open and closed  min guard   Knee/Hip Exercises: Seated   Long Arc Quad Strengthening;Both;2 sets;10 reps;Weights   Long Arc Quad Weight 5 lbs.   Long [employer] Limitations cues for upright posture   Other Seated Knee/Hip Exercises hip flexion 5 pounds x15 RT and Lt x 15 each                  PT Short Term Goals - 03/21/15 1302    PT SHORT TERM GOAL #1   Title Pt will be independent with initial HEP for strengthening and balance (03/16/15)   Time 4   Period Weeks   Status On-going   PT SHORT TERM GOAL #2   Title Pt will improve Berg to 30/56 to decrease fall risk. (03/16/15)   Time 4   Period Weeks   Status On-going   PT SHORT TERM GOAL #3   Title Pt will report </= 4/10 pain with walking of 600 feet to improve mobility within household. 96/22/16)   Baseline No pain but fatigue limited   Time 4   Period Weeks   Status Partially Met           PT Long Term Goals - 03/21/15 1303    PT LONG TERM GOAL #1   Title Pt/caregiver will be independent with advanced HEP. (04/13/15)   Time 8   Period Weeks   Status On-going   PT LONG TERM GOAL #2   Title Pt will improve Berg to >/= 36/56 to decrease fall risk. (04/13/15)   Time 8   Period Weeks   Status On-going   PT LONG TERM GOAL #3   Title Pt will report pain </= 2/10 with walking up to 600' for improved mobility in household. (04/13/15)   Time 8   Period Weeks   Status On-going   PT LONG TERM GOAL #4   Title Pt will improve gait velocity to >/= 2.0 ft/sec for decreased fall risk. (04/13/15)   Time 8   Period Weeks   Status On-going   PT LONG TERM GOAL #5   Title Pt will improve hip strength to >/= 4/5 for improved ability with household tasks. (04/13/15)   Time 8   Status On-going               Plan -  03/21/15 1301    Clinical Impression Statement Focus today on balance wth pt challenged with narrow base balance exercises requiring min assist to recover from LOB multiple times. Gait distance decreased today due to fatigue and  likely due to not beginning treatment with gait training.    PT Next Visit Plan strength and balance. walk for distance as tolerated, check progres toward goals, gait a beginning of treatment        Problem List Patient Active Problem List   Diagnosis Date Noted  . Chronic systolic CHF (congestive heart failure) 01/18/2015  . History of right hip hemiarthroplasty   . Closed right hip fracture   . Fall   . Paroxysmal atrial tachycardia   . Paroxysmal atrial flutter   . CKD (chronic kidney disease), stage III   . Encounter for therapeutic drug monitoring 11/03/2013  . Eyelid cyst 07/09/2012  . Insomnia 11/18/2011  . BPH with urinary obstruction 05/14/2011  . HYPERTENSION, BENIGN 08/04/2009  . MITRAL INSUFFICIENCY 04/04/2009  . SYSTOLIC HEART FAILURE, CHRONIC 04/04/2009  . Paroxysmal atrial fibrillation 02/22/2009  . Unsteady gait 10/13/2008  . INSOMNIA, PERSISTENT 02/11/2008  . Hypothyroidism 07/21/2007  . ANXIETY 07/21/2007  . Coronary atherosclerosis 07/21/2007  . PERIPHERAL VASCULAR DISEASE 07/21/2007  . SKIN CANCER, HX OF 07/21/2007  . COLONIC POLYPS, HX OF 07/21/2007    Dorene Ar, PTA 03/21/2015, 1:09 PM  [place], [place], [address] Phone: [phone]   Fax:  [phone]

## 2015-03-23 ENCOUNTER — Ambulatory Visit (HOSPITAL_COMMUNITY)
Admission: RE | Admit: 2015-03-23 | Discharge: 2015-03-23 | Disposition: A | Discharge status: Home / Self Care | Payer: Medicare Other | Source: Ambulatory Visit | Attending: Cardiology | Admitting: Cardiology

## 2015-03-23 ENCOUNTER — Ambulatory Visit: Payer: Medicare Other | Admitting: Physical Therapy

## 2015-03-23 ENCOUNTER — Ambulatory Visit (INDEPENDENT_AMBULATORY_CARE_PROVIDER_SITE_OTHER): Payer: Medicare Other | Admitting: *Deleted

## 2015-03-23 ENCOUNTER — Encounter: Payer: Self-pay | Admitting: Cardiology

## 2015-03-23 DIAGNOSIS — R2689 Other abnormalities of gait and mobility: Secondary | ICD-10-CM

## 2015-03-23 DIAGNOSIS — R293 Abnormal posture: Secondary | ICD-10-CM

## 2015-03-23 DIAGNOSIS — Z5181 Encounter for therapeutic drug level monitoring: Secondary | ICD-10-CM | POA: Diagnosis not present

## 2015-03-23 DIAGNOSIS — I480 Paroxysmal atrial fibrillation: Secondary | ICD-10-CM

## 2015-03-23 DIAGNOSIS — I5022 Chronic systolic (congestive) heart failure: Secondary | ICD-10-CM | POA: Diagnosis not present

## 2015-03-23 DIAGNOSIS — I4891 Unspecified atrial fibrillation: Secondary | ICD-10-CM | POA: Diagnosis not present

## 2015-03-23 DIAGNOSIS — R269 Unspecified abnormalities of gait and mobility: Secondary | ICD-10-CM

## 2015-03-23 DIAGNOSIS — R531 Weakness: Secondary | ICD-10-CM

## 2015-03-23 LAB — HEPATIC FUNCTION PANEL
ALK PHOS: 129 U/L — AB (ref 38–126)
ALT: 61 U/L (ref 17–63)
AST: 108 U/L — AB (ref 15–41)
Albumin: 2.6 g/dL — ABNORMAL LOW (ref 3.5–5.0)
BILIRUBIN DIRECT: 0.3 mg/dL (ref 0.1–0.5)
Indirect Bilirubin: 0.5 mg/dL (ref 0.3–0.9)
Total Bilirubin: 0.8 mg/dL (ref 0.3–1.2)
Total Protein: 7 g/dL (ref 6.5–8.1)

## 2015-03-23 LAB — POCT INR: INR: 2.4

## 2015-03-23 NOTE — Therapy (Signed)
[place], [place], [address] Phone: [phone]   Fax:  [phone]  Physical Therapy Treatment  Patient Details  Name: [name] MRN: [id number] Date of Birth: [birth year] Referring Provider:  Cassandria Anger, MD  Encounter Date: 03/23/2015      PT End of Session - 03/23/15 1040    Visit Number 9   Number of Visits 16   Date for PT Re-Evaluation 04/13/15   PT Start Time 0931   PT Stop Time 1015   PT Time Calculation (min) 44 min   Equipment Utilized During Treatment Gait belt   Activity Tolerance Patient limited by fatigue   Behavior During Therapy [facility] for tasks assessed/performed      Past Medical History  Diagnosis Date  . CAD (coronary artery disease)     a. LHC 4/06 with 60% pLAD, 40% ramus, 60% mRCA, and 90% mPDA. No intervention. Inferior akinesis on LV-gram was suggestive of possible inferior MI with recanalization.  . Hyperlipidemia     a. Myalgias with Lipitor and Crestor.  . Hypothyroidism   . Peripheral vascular disease   . Anxiety   . Hx of colonic polyps   . Paroxysmal atrial fibrillation 02/2009     a. With RVR tx with amiodarone returning to NSR 02/2009. b. Had atypical aflutter vs atrial tach 4/14, converted to NSR 4/14. In atypical flutter vs atrial tach in 1/15, DCCV 1/15 to NSR. c. Atrial tach 7/15 with junctional escape 60s.  . Systolic CHF     a.  Probable ischemic cardiomyopathy. Echo (6/10) with EF 40%, mildly dilated LV, moderate MR. ;  b.  echo 3/12: mild LVH, EF 30-35%, IL HK and Lat HK, mod to severe MR, trivial AI, mod LAE  . PNA (pneumonia)     History of   . Mitral regurgitation     a. Moderate by echo 6/10. b. 2-3+ by cath 2006; mod to severe by echo 3/12  . PVC's (premature ventricular contractions)   . Cataract   . Heart murmur   . CKD (chronic kidney disease), stage III   . Skin cancer     Hx of 2008 BCC  . Chronic indwelling Foley catheter     per  family, bladder muscles no longer work  . Depression   . Myocardial infarction   . Basal cell carcinoma   . Paroxysmal atrial tachycardia     a. See details under PAF.  Marland Kitchen Paroxysmal atrial flutter     a. See details under PAF.  Marland Kitchen BPH (benign prostatic hyperplasia)   . Recurrent UTI   . Imbalance   . Carotid stenosis     a. Dopplers 2/15 - 40-59% BICA.    Past Surgical History  Procedure Laterality Date  . Skin cancer excision  2008    shoulder and nose   . Cardioversion N/A 01/02/2013    Procedure: CARDIOVERSION;  Surgeon: Larey Dresser, MD;  Location: [place];  Service: Cardiovascular;  Laterality: N/A;  . Cardioversion N/A 10/23/2013    Procedure: CARDIOVERSION;  Surgeon: Larey Dresser, MD;  Location: [facility] ENDOSCOPY;  Service: Cardiovascular;  Laterality: N/A;  . Hip arthroplasty Right 08/21/2014    Procedure: ARTHROPLASTY BIPOLAR HIP;  Surgeon: Mcarthur Rossetti, MD;  Location: [place];  Service: Orthopedics;  Laterality: Right;    There were no vitals filed for this visit.  Visit Diagnosis:  Abnormality of gait  Difficulty balancing when standing  Weakness generalized  Abnormal  posture      Subjective Assessment - 03/23/15 0938    Subjective did not sleep well last night, feeling light headed; difficult to breathe today; mild discomfort on operated hip (R); checked O2 sats after pt said he was feeling hard of breathing - 98%   Patient is accompained by: Interpreter   Currently in Pain? Other (Comment)  mild discomfort on R operated hip             [facility] PT Assessment - 03/23/15 0942    ROM / Strength   AROM / PROM / Strength Strength   Strength   Overall Strength Comments R hip flexors 4-/5 and L hip flexors 4/5                     [facility] Adult PT Treatment/Exercise - 03/23/15 0942    Ambulation/Gait   Ambulation/Gait Yes   Ambulation/Gait Assistance 5: Supervision;4: Min guard   Ambulation/Gait Assistance Details supervision during  first 200 ft; patient started to go into knee flexion and needed a little more assistance   Ambulation Distance (Feet) 304 Feet   Assistive device Rolling walker   Ambulation Surface Level   Gait velocity 0.85 ft/sec   High Level Balance   High Level Balance Activities Backward walking;Head turns   High Level Balance Comments challeneged balance on 2" foam pad, no UE, narrow BOS, eyes closed and head turns; pt needed min assist to keep from swaying in all directions, also alternating step taps to 2 inch pad with max cues and min asssit   Lumbar Exercises: Seated   Sit to Stand 10 reps   Sit to Stand Limitations no rest or elevated seat; no UE use   Knee/Hip Exercises: Seated   Long Arc Quad Strengthening;Both;2 sets;10 reps;Weights   Long Arc Quad Weight 5 lbs.   Other Seated Knee/Hip Exercises hip flexion 5 pounds x10 RT and Lt x 2 sets each                   PT Short Term Goals - 03/21/15 1302    PT SHORT TERM GOAL #1   Title Pt will be independent with initial HEP for strengthening and balance (03/16/15)   Time 4   Period Weeks   Status On-going   PT SHORT TERM GOAL #2   Title Pt will improve Berg to 30/56 to decrease fall risk. (03/16/15)   Time 4   Period Weeks   Status On-going   PT SHORT TERM GOAL #3   Title Pt will report </= 4/10 pain with walking of 600 feet to improve mobility within household. 96/22/16)   Baseline No pain but fatigue limited   Time 4   Period Weeks   Status Partially Met           PT Long Term Goals - 03/23/15 1045    PT LONG TERM GOAL #1   Title Pt/caregiver will be independent with advanced HEP. (04/13/15)   Time 8   Period Weeks   Status On-going   PT LONG TERM GOAL #2   Title Pt will improve Berg to >/= 36/56 to decrease fall risk. (04/13/15)   Time 8   Period Weeks   Status On-going   PT LONG TERM GOAL #3   Title Pt will report pain </= 2/10 with walking up to 600' for improved mobility in household. (04/13/15)   Time 8    Period Weeks   Status On-going   PT  LONG TERM GOAL #4   Title Pt will improve gait velocity to >/= 2.0 ft/sec for decreased fall risk. (04/13/15)   Time 8   Period Weeks   Status On-going   PT LONG TERM GOAL #5   Title Pt will improve hip strength to >/= 4/5 for improved ability with household tasks. (04/13/15)   Time 8   Period Weeks   Status Partially Met               Plan - 03/23/15 1040    Clinical Impression Statement focued on gait, balance and strengthening today; R hip flexion tested at 4-/5 and L at 4/5 partially meeting LTG #5; gait distance not at full today due to pt feeling hardness to breathe; added foam mat to increase challenge for balance - patient showed moderate swaying in all directions; continuing progress towards all other goals    PT Next Visit Plan check gait distance at beginning of treatment; standing LE exercises instead of seated (pt. stated its easy that way); continue challenging patient with foam pad and toe taps with no UE        During this treatment session, the therapist was present, participating in and directing the treatment.  Problem List Patient Active Problem List   Diagnosis Date Noted  . Chronic systolic CHF (congestive heart failure) 01/18/2015  . History of right hip hemiarthroplasty   . Closed right hip fracture   . Fall   . Paroxysmal atrial tachycardia   . Paroxysmal atrial flutter   . CKD (chronic kidney disease), stage III   . Encounter for therapeutic drug monitoring 11/03/2013  . Eyelid cyst 07/09/2012  . Insomnia 11/18/2011  . BPH with urinary obstruction 05/14/2011  . HYPERTENSION, BENIGN 08/04/2009  . MITRAL INSUFFICIENCY 04/04/2009  . SYSTOLIC HEART FAILURE, CHRONIC 04/04/2009  . Paroxysmal atrial fibrillation 02/22/2009  . Unsteady gait 10/13/2008  . INSOMNIA, PERSISTENT 02/11/2008  . Hypothyroidism 07/21/2007  . ANXIETY 07/21/2007  . Coronary atherosclerosis 07/21/2007  . PERIPHERAL VASCULAR DISEASE  07/21/2007  . SKIN CANCER, HX OF 07/21/2007  . COLONIC POLYPS, HX OF 07/21/2007   Hessie Diener, PTA 03/23/2015 1:28 PM Phone: [phone] Fax: [place], [place]   03/23/2015 1:28 PM   Phone: [phone]   Fax: [facility] [place], [place], [address] Phone: [phone]   Fax:  [phone]

## 2015-03-24 ENCOUNTER — Encounter: Payer: Self-pay | Admitting: Cardiology

## 2015-03-30 ENCOUNTER — Ambulatory Visit (HOSPITAL_COMMUNITY)
Admission: RE | Admit: 2015-03-30 | Discharge: 2015-03-30 | Disposition: A | Discharge status: Home / Self Care | Payer: Medicare Other | Source: Ambulatory Visit | Attending: Cardiology | Admitting: Cardiology

## 2015-03-30 ENCOUNTER — Ambulatory Visit: Payer: Medicare Other | Attending: Orthopaedic Surgery

## 2015-03-30 VITALS — BP 102/50 | HR 53 | Wt 122.2 lb

## 2015-03-30 DIAGNOSIS — I739 Peripheral vascular disease, unspecified: Secondary | ICD-10-CM | POA: Insufficient documentation

## 2015-03-30 DIAGNOSIS — I255 Ischemic cardiomyopathy: Secondary | ICD-10-CM | POA: Insufficient documentation

## 2015-03-30 DIAGNOSIS — K745 Biliary cirrhosis, unspecified: Secondary | ICD-10-CM | POA: Diagnosis not present

## 2015-03-30 DIAGNOSIS — R7989 Other specified abnormal findings of blood chemistry: Secondary | ICD-10-CM

## 2015-03-30 DIAGNOSIS — R29818 Other symptoms and signs involving the nervous system: Secondary | ICD-10-CM | POA: Insufficient documentation

## 2015-03-30 DIAGNOSIS — I5022 Chronic systolic (congestive) heart failure: Secondary | ICD-10-CM | POA: Diagnosis not present

## 2015-03-30 DIAGNOSIS — R293 Abnormal posture: Secondary | ICD-10-CM | POA: Insufficient documentation

## 2015-03-30 DIAGNOSIS — N189 Chronic kidney disease, unspecified: Secondary | ICD-10-CM | POA: Diagnosis not present

## 2015-03-30 DIAGNOSIS — E785 Hyperlipidemia, unspecified: Secondary | ICD-10-CM | POA: Insufficient documentation

## 2015-03-30 DIAGNOSIS — E039 Hypothyroidism, unspecified: Secondary | ICD-10-CM | POA: Insufficient documentation

## 2015-03-30 DIAGNOSIS — R269 Unspecified abnormalities of gait and mobility: Secondary | ICD-10-CM | POA: Insufficient documentation

## 2015-03-30 DIAGNOSIS — Z87891 Personal history of nicotine dependence: Secondary | ICD-10-CM | POA: Diagnosis not present

## 2015-03-30 DIAGNOSIS — I6523 Occlusion and stenosis of bilateral carotid arteries: Secondary | ICD-10-CM | POA: Diagnosis not present

## 2015-03-30 DIAGNOSIS — Z79899 Other long term (current) drug therapy: Secondary | ICD-10-CM | POA: Insufficient documentation

## 2015-03-30 DIAGNOSIS — R29898 Other symptoms and signs involving the musculoskeletal system: Secondary | ICD-10-CM | POA: Insufficient documentation

## 2015-03-30 DIAGNOSIS — I480 Paroxysmal atrial fibrillation: Secondary | ICD-10-CM | POA: Diagnosis present

## 2015-03-30 DIAGNOSIS — Z7901 Long term (current) use of anticoagulants: Secondary | ICD-10-CM | POA: Diagnosis not present

## 2015-03-30 DIAGNOSIS — I340 Nonrheumatic mitral (valve) insufficiency: Secondary | ICD-10-CM | POA: Diagnosis not present

## 2015-03-30 DIAGNOSIS — I2510 Atherosclerotic heart disease of native coronary artery without angina pectoris: Secondary | ICD-10-CM | POA: Diagnosis not present

## 2015-03-30 DIAGNOSIS — R945 Abnormal results of liver function studies: Secondary | ICD-10-CM

## 2015-03-30 DIAGNOSIS — I4892 Unspecified atrial flutter: Secondary | ICD-10-CM | POA: Diagnosis not present

## 2015-03-30 DIAGNOSIS — R2689 Other abnormalities of gait and mobility: Secondary | ICD-10-CM

## 2015-03-30 DIAGNOSIS — R531 Weakness: Secondary | ICD-10-CM | POA: Diagnosis present

## 2015-03-30 LAB — DIGOXIN LEVEL: Digoxin Level: 0.2 ng/mL — ABNORMAL LOW (ref 0.8–2.0)

## 2015-03-30 LAB — TSH: TSH: 10.06 u[IU]/mL — ABNORMAL HIGH (ref 0.350–4.500)

## 2015-03-30 NOTE — Therapy (Signed)
[place], [place], [address] Phone: [phone]   Fax:  [phone]  Physical Therapy Treatment  Patient Details  Name: [name] MRN: [id number] Date of Birth: 04-13-1926 Referring Provider:  Cassandria Anger, MD  Encounter Date: 03/30/2015      PT End of Session - 03/30/15 1210    Visit Number 10   Number of Visits 16   Date for PT Re-Evaluation 04/13/15   PT Start Time 5697   PT Stop Time 1230   PT Time Calculation (min) 45 min   Activity Tolerance Patient tolerated treatment well   Behavior During Therapy [facility] for tasks assessed/performed      Past Medical History  Diagnosis Date  . CAD (coronary artery disease)     a. LHC 4/06 with 60% pLAD, 40% ramus, 60% mRCA, and 90% mPDA. No intervention. Inferior akinesis on LV-gram was suggestive of possible inferior MI with recanalization.  . Hyperlipidemia     a. Myalgias with Lipitor and Crestor.  . Hypothyroidism   . Peripheral vascular disease   . Anxiety   . Hx of colonic polyps   . Paroxysmal atrial fibrillation 02/2009     a. With RVR tx with amiodarone returning to NSR 02/2009. b. Had atypical aflutter vs atrial tach 4/14, converted to NSR 4/14. In atypical flutter vs atrial tach in 1/15, DCCV 1/15 to NSR. c. Atrial tach 7/15 with junctional escape 60s.  . Systolic CHF     a.  Probable ischemic cardiomyopathy. Echo (6/10) with EF 40%, mildly dilated LV, moderate MR. ;  b.  echo 3/12: mild LVH, EF 30-35%, IL HK and Lat HK, mod to severe MR, trivial AI, mod LAE  . PNA (pneumonia)     History of   . Mitral regurgitation     a. Moderate by echo 6/10. b. 2-3+ by cath 2006; mod to severe by echo 3/12  . PVC's (premature ventricular contractions)   . Cataract   . Heart murmur   . CKD (chronic kidney disease), stage III   . Skin cancer     Hx of 2008 BCC  . Chronic indwelling Foley catheter     per family, bladder muscles no longer work  .  Depression   . Myocardial infarction   . Basal cell carcinoma   . Paroxysmal atrial tachycardia     a. See details under PAF.  Marland Kitchen Paroxysmal atrial flutter     a. See details under PAF.  Marland Kitchen BPH (benign prostatic hyperplasia)   . Recurrent UTI   . Imbalance   . Carotid stenosis     a. Dopplers 2/15 - 40-59% BICA.    Past Surgical History  Procedure Laterality Date  . Skin cancer excision  2008    shoulder and nose   . Cardioversion N/A 01/02/2013    Procedure: CARDIOVERSION;  Surgeon: Larey Dresser, MD;  Location: [place];  Service: Cardiovascular;  Laterality: N/A;  . Cardioversion N/A 10/23/2013    Procedure: CARDIOVERSION;  Surgeon: Larey Dresser, MD;  Location: [facility] ENDOSCOPY;  Service: Cardiovascular;  Laterality: N/A;  . Hip arthroplasty Right 08/21/2014    Procedure: ARTHROPLASTY BIPOLAR HIP;  Surgeon: Mcarthur Rossetti, MD;  Location: [place];  Service: Orthopedics;  Laterality: Right;    There were no vitals filed for this visit.  Visit Diagnosis:  Abnormality of gait  Difficulty balancing when standing  Weakness generalized      Subjective Assessment - 03/30/15 1225  Subjective He feels he is doing better with better activity tolerance and was able to walk with asssitance 20-30 feet at home without device   Patient is accompained by: Interpreter   Currently in Pain? No/denies            [facility] PT Assessment - 03/30/15 1151    Berg Balance Test   Sit to Stand Able to stand  independently using hands   Standing Unsupported Able to stand 2 minutes with supervision   Sitting with Back Unsupported but Feet Supported on Floor or Stool Able to sit safely and securely 2 minutes   Stand to Sit Controls descent by using hands   Transfers Able to transfer with verbal cueing and /or supervision   Standing Unsupported with Eyes Closed Able to stand 10 seconds with supervision   Standing Ubsupported with Feet Together Needs help to attain position but able to  stand for 30 seconds with feet together   From Standing, Reach Forward with Outstretched Arm Can reach forward >5 cm safely (2")   From Standing Position, Pick up Object from Floor Unable to try/needs assist to keep balance   From Standing Position, Turn to Look Behind Over each Shoulder Turn sideways only but maintains balance   Turn 360 Degrees Needs close supervision or verbal cueing   Standing Unsupported, Alternately Place Feet on Step/Stool Needs assistance to keep from falling or unable to try   Standing Unsupported, One Foot in Front Loses balance while stepping or standing   Standing on One Leg Unable to try or needs assist to prevent fall   Total Score 24                     [facility] Adult PT Treatment/Exercise - 03/30/15 1238    Ambulation/Gait   Ambulation/Gait Yes   Ambulation/Gait Assistance 5: Supervision   Ambulation/Gait Assistance Details He was able to walk 250 feet with close supervision with tendency to list LT but did nmot lose balance   Assistive device Rolling walker   Ambulation Surface Level   Knee/Hip Exercises: Aerobic   Stationary Bike Nustep LE only L 5 8 min                PT Education - 03/30/15 1226    Education provided Yes   Education Details need to use walker for ambulation at home   Person(s) Educated Patient  through interpreter   Methods Explanation   Comprehension Verbalized understanding          PT Short Term Goals - 03/30/15 1233    PT SHORT TERM GOAL #1   Title Pt will be independent with initial HEP for strengthening and balance (03/16/15)   Baseline Appears he does with assist at home   Status Partially Met   PT [facility] #2   Title Pt will improve Berg to 30/56 to decrease fall risk. (03/16/15)   Baseline 24/56 today    Status On-going   PT SHORT TERM GOAL #3   Title Pt will report </= 4/10 pain with walking of 600 feet to improve mobility within household. 96/22/16)   Baseline No pain but fatigue  limited   Status Partially Met           PT Long Term Goals - 03/30/15 1233    PT LONG TERM GOAL #1   Title Pt/caregiver will be independent with advanced HEP. (04/13/15)   Status On-going   PT LONG TERM GOAL #2  Title Pt will improve Berg to >/= 36/56 to decrease fall risk. (04/13/15)   Status On-going   PT LONG TERM GOAL #3   Title Pt will report pain </= 2/10 with walking up to 600' for improved mobility in household. (04/13/15)   Baseline Pain does not limit , fatigue does   Status On-going   PT LONG TERM GOAL #4   Title Pt will improve gait velocity to >/= 2.0 ft/sec for decreased fall risk. (04/13/15)   Status Unable to assess   PT LONG TERM GOAL #5   Title Pt will improve hip strength to >/= 4/5 for improved ability with household tasks. (04/13/15)   Status On-going               Plan - 2015/04/17 1222    Clinical Impression Statement No improvement on the BERG but patient feels he has better endurance and can walk without device with someone contact guard20-30 feet at home . He continues withj weakness of LE and especially Rt hip with trendelenberg with weight to RT leg.    PT Frequency 1x / week   PT Duration 3 weeks   PT Next Visit Plan Continue LE stengthening and walking endurance   Consulted and Agree with Plan of Care Patient  and interpreter          G-Codes - 2015/04/17 1151    Functional Assessment Tool Used LEFS,BERG   Functional Limitation Mobility: Walking and moving around   Mobility: Walking and Moving Around Current Status [phone]) At least 80 percent but less than 100 percent impaired, limited or restricted   Mobility: Walking and Moving Around Goal Status [phone]) At least 60 percent but less than 80 percent impaired, limited or restricted      Problem List Patient Active Problem List   Diagnosis Date Noted  . Chronic systolic CHF (congestive heart failure) 01/18/2015  . History of right hip hemiarthroplasty   . Closed right hip fracture   .  Fall   . Paroxysmal atrial tachycardia   . Paroxysmal atrial flutter   . CKD (chronic kidney disease), stage III   . Encounter for therapeutic drug monitoring 11/03/2013  . Eyelid cyst 07/09/2012  . Insomnia 11/18/2011  . BPH with urinary obstruction 05/14/2011  . HYPERTENSION, BENIGN 08/04/2009  . MITRAL INSUFFICIENCY 04/04/2009  . SYSTOLIC HEART FAILURE, CHRONIC 04/04/2009  . Paroxysmal atrial fibrillation 02/22/2009  . Unsteady gait 10/13/2008  . INSOMNIA, PERSISTENT 02/11/2008  . Hypothyroidism 07/21/2007  . ANXIETY 07/21/2007  . Coronary atherosclerosis 07/21/2007  . PERIPHERAL VASCULAR DISEASE 07/21/2007  . SKIN CANCER, HX OF 07/21/2007  . COLONIC POLYPS, HX OF 07/21/2007    Darrel Hoover PT 2015/04/17, 12:40 PM  [facility] [address] [place], [place], [address] Phone: [phone]   Fax:  [phone]

## 2015-03-30 NOTE — Patient Instructions (Signed)
Through interpreter he was reminded not to wlak without walker at home due to reisk of fall as indicated by low BERG score.

## 2015-03-30 NOTE — Progress Notes (Signed)
Patient ID: [name], male   DOB: Feb 03, 1926, 81 y.o.   MRN: [id number] PCP: Dr.Plotnikov  81 yo with history of CAD, ischemic CMP, CKD, and paroxysmal atrial fibrillation and atrial tachycardia presents for cardiology followup. He speaks little [place] and his daughter interprets.  No chest pain.  Patient has a history of myalgias with Crestor and Lipitor.  He has tolerated pravastatin without myalgias.  At a prior appt, he was noted to be back in atypical atrial flutter  Vs atrial tach.  It was rate-controlled and initially and was treated with a rate control/anticoagulation strategy initially.  However, he started to notice increased exertional dyspnea.  Therefore, Dr Aundra Dubin took for cardioversion back to NSR, in 1/15.  He has been on amiodarone.  He has had paroxysmal atrial tachycardia.  At 8/15 appointment with Dr Aundra Dubin, he was in atrial tachycardia with VA dissociation (but occasionally atrial activity is conducted).  Digoxin was stopped.  He had a high junctional escape with rate in 60s but felt fine.  Later in the month, he had a UTI and went into atrial tachycardia with RVR.  Was seen on 05/24/14.  Amiodarone increased to BID and Lasix was increased due to volume overload.  Amiodarone was eventually decreased back to 200 mg daily but he went back in atrial tachy with 2:1 block and was symptomatic.  Again increased amiodarone to 200 mg bid. PFTs done in 10/15 showed mild restriction, no marked abnormalities.   He fell in 11/15 and fractured his right hip. This was surgically repaired. He spent some time in rehab but is now at home.    He reports today for HF follow up with translator. He seems to be doing well overall, and says he has no new complaints. He is in NSR today by EKG. Can walk about "120 steps" prior to getting SOB.  He walks 30 minutes/day up and down hall in his house and walks outside when it's not too hot.  He uses a walker the majority of the time.  He is short of breath after  walking about 100 feet.  No chest pain.  No orthopnea/PND.  No lightheadedness or falls.   He does have balance problems but attributes that to age and deconditioning.  He denies the room spinning or near syncope. Not checking daily weights, but feels like he is down overall.  Labs (9/11): K 4.1, creatinine 1.4  Labs (8/12): K 3.8, creatinine 1.3, TSH normal, LFTs normal, LDL 96, HDL 50 Labs (1/13): K 3.9, creatinine 1.3, TSH normal, LFTs normal Labs (2/13): K 3.8, creatinine 1.3 Labs (5/13): K 3.5, creatinine 1.4, BNP 331, LFTs normal, TSH normal Labs (9/13): LDL 85, HDL 53, BNP 547, LFTs normal Labs (12/13): K 4.3, creatinine 1.49 => 1.4, digoxin 1.4, TSH normal, LFTs normal Labs (1/14): digoxin 1.0, K 4.3, creatinine 1.5 Labs (3/14): free T4 normal Labs (4/14): K 5=> 3.8, creatinine 1.8 => 1.4, digoxin 0.6 Labs (5/14): TSH normal, digoxin level normal Labs (8/14): K 4.2, creatinine 1.66, AST 42, ALT 37 Labs (9/14): K 4, creatinine 1.6, digoxin 1.3, LDL 104, HDL 51, AST 41, ALT 30 Labs (11/14): K 3.6, creatinine 1.5, BNP 126 Labs (12/14): TSH 6.93 Labs (2/15): K 3.7, creatinine 1.4, TSH normal, AST 59, ALT 56 Labs (3/15): TSH low, AST 57, ALT 30, digoxin 1.2 Labs (4/15): K 4, creatinine 1.4, LFTs normal Labs (6/15): TSH normal, digoxin level 0. Labs (8/15): K 4.3, creatinine 1.7 => 1.6 => 1.63, AST 75, ALT 70,  hgb 11.8, WBCs 9.7 Labs (10/15): K 4.2, creatinine 1.75, AST 93, ALT 60 Labs (12/15): hgb 10.3, K 4.1, creatinine 1.5, ALT 31, AST 77 Labs (1/16): K 4.2, creatinine 1.51, AST 69, ALT 45, hgb 11, digoxin 0.5 Labs (3/16): K 4.1, creatinine 1.26, TSH normal, HCT 36.7 Labs (6/16): K 4.3, creatinine 1.35, AST 108, ALT 61  ECG: NSR, 1st degree AVB, right axis deviation, IVCD 124 msec  Allergies (verified):  1) ! Crestor  2) Vytorin  3) Atenolol   Past Medical History:  1. Coronary artery disease: LHC 4/06 with 60% pLAD, 40% ramus, 60% mRCA, and 90% mPDA. No intervention.  Inferior akinesis on LV-gram was suggestive of possible inferior MI with recanalization.  2. Hyperlipidemia: Myalgias with Lipitor and Crestor.  3. Hypothyroidism  4. Peripheral vascular disease  5. Anxiety  6. Skin cancer, hx of 2008 BCC  7. Colonic polyps, hx of  8. Atrial arrhythmias (PAF and atrial tachycardia): Paroxysmal atrial fibrillation with rapid ventricular response treated with amiodarone therapy converting to normal sinus rhythm 02/2009.  He has also had atypical atrial flutter versus atrial tachycardia noted in 4/14, cardioverted to NSR in 4/14. In atypical flutter versus atrial tachycardia again in 1/15, DCCV in 1/15 to NSR. Atrial tachycardia in 7/15, 8/15, 9/15 with junctional escape in 60s or 2:1 block with faster rate. Off digoxin given PAT with block.  PFTs (10/15) mild restriction, no marked abnormalities.  9. Systolic CHF: Probable ischemic cardiomyopathy. Echo (6/10) with EF 40%, mildly dilated LV, moderate MR.  Echo (3/12): EF 30-35%, inferolateral severe hypokinesis, trivial AI, moderate to severe MR.  10. History of PNA  11. CKD. Baseline creatinine around 1.4.  12. Mitral regurgitation: Moderate by echo 6/10. 2-3+ by cath 2006.  Moderate-severe by echo in 3/12.  13. PVCs  14. BPH 15. Recurrent UTIs 16. Imbalance 17. Carotid stenosis: Dopplers (2/15) with 40-59% BICA stenosis. Carotid dopplers (1/16) with 40-59% BICA stenosis.  18. Possible tendinopathy with Cipro 19. Right hip fracture with repair in 11/15.   Family History:  Family History of CAD Male 1st degree relative <60   Social History:  Retired  Originally from [place], speaks little [place]  Married  Former Smoker   ROS: All systems reviewed and negative except as per HPI.   Current Outpatient Prescriptions  Medication Sig Dispense Refill  . amiodarone (PACERONE) 100 MG tablet Take 200 mg by mouth daily.     . Control Gel Formula Dressing (DUODERM CGF DRESSING) MISC Apply 2 each topically 2 (two)  times a week. 12 each 0  . digoxin (LANOXIN) 0.125 MG tablet Take 0.5 tablets (0.0625 mg total) by mouth every other day. (Patient taking differently: Take 0.0625 mg by mouth every other day. Takes at noon) 15 tablet 3  . furosemide (LASIX) 20 MG tablet Take 1 tablet (20 mg total) by mouth daily. (Patient taking differently: Take 20 mg by mouth every evening. ) 90 tablet 3  . furosemide (LASIX) 40 MG tablet Take one tab( 40 mg) in the AM 90 tablet 3  . levothyroxine (SYNTHROID, LEVOTHROID) 125 MCG tablet TAKE 1 TABLET (125 MCG TOTAL) BY MOUTH DAILY BEFORE BREAKFAST. 30 tablet 5  . lisinopril (PRINIVIL,ZESTRIL) 2.5 MG tablet TAKE 1 TABLET (2.5 MG TOTAL) BY MOUTH DAILY. 90 tablet 0  . LORazepam (ATIVAN) 0.5 MG tablet Take 0.5 mg by mouth 2 (two) times daily.    . metoprolol succinate (TOPROL XL) 25 MG 24 hr tablet Take 0.5 tablets (12.5 mg total) by mouth  daily. 30 tablet 6  . MILK THISTLE PO Take 325 mg by mouth 3 (three) times daily.    . Multiple Vitamin (MULTIVITAMIN WITH MINERALS) TABS tablet Take 1 tablet by mouth daily.    . Omega-3 Fatty Acids (FISH OIL) 1200 MG CAPS Take 1 capsule (1,200 mg total) by mouth daily. 90 capsule 3  . potassium chloride (K-DUR,KLOR-CON) 10 MEQ tablet Take 2 tablets (20 mEq total) by mouth daily. 60 tablet 6  . Sennosides-Docusate Sodium (SENNA) 8.6-50 MG TABS Take 2 tablets by mouth daily.    . tamsulosin (FLOMAX) 0.4 MG CAPS capsule Take 0.4 mg by mouth at bedtime.     Marland Kitchen warfarin (COUMADIN) 1 MG tablet Take 1 tablet (1 mg total) by mouth daily. 45 tablet 3  . zolpidem (AMBIEN) 5 MG tablet Take 1 tablet (5 mg total) by mouth at bedtime as needed for sleep. 90 tablet 1   No current facility-administered medications for this encounter.    BP 102/50 mmHg  Pulse 53  Wt 122 lb 4 oz (55.452 kg)  SpO2 98% General: NAD Neck: JVP 7 cm; no thyromegaly or thyroid nodule.  Lungs: Slight crackles at bases.   CV: Nondisplaced PMI.  Heart regular, S1/S2, no S3/S4, 1/6  HSM at apex.  No edema.  Right carotid bruit. Abdomen: Soft, nontender, no hepatosplenomegaly, no distention.  Neurologic: Alert and oriented x 3.  Psych: Normal affect. Extremities: No clubbing or cyanosis.   Assessment/Plan: 1. Atrial arrhythmias  - Patient remains in NSR at amiodarone 200 mg  daily - Recent PFTs did not suggest amiodarone lung toxicity.   - We decreased amiodarone to 200 mg daily due to slightly elevated on 03/23/15 AST 108, ALT 61 - Continue amiodarone at 200 mg bid,  - Continue current Toprol XL and warfarin.  2. HYPERLIPIDEMIA  - He is tolerating pravastatin without myalgias.  - Gets myalgias with Lipitor and Crestor.  - Although no optimal control, will continue current pravastatin as he tolerates it.    3. MITRAL INSUFFICIENCY - Last ECHO with moderate to severe MR.  - Given age and medical condition, would plan medical management.  4. SYSTOLIC HEART FAILURE, CHRONIC  EF 30-35% on last echo. NYHA class II-III symptoms,stable.  He appears euvolemic.  - Continue Lasix to 40 qam/20 qpm.    - Continue current lisinopril and Toprol XL.  - Continue digoxin    5. Carotid stenosis: Stable mild to moderate disease.  Repeat 1/17.  6. CKD: Stable creatinine.   Satira Mccallum Tillery PA-C 03/30/2015  Patient seen with PA, agree with the above note.  He is overall stable.  However, has persistent mild transaminase increase.  May be related to amiodarone.  He has tolerated lowering amiodarone to 200 mg daily.  He does not tolerate atrial arrhythmias well.  I will repeat LFTs in 1 months and will get RUQ [place] to evaluate liver.   Check TSH and digoxin levels today.  Return in 2 months.   Loralie Champagne 03/31/2015

## 2015-03-30 NOTE — Patient Instructions (Signed)
Labs today  Labs in 1 month  RUQ abd ultrasound  Your physician recommends that you schedule a follow-up appointment in: 2 months

## 2015-03-31 DIAGNOSIS — R7989 Other specified abnormal findings of blood chemistry: Secondary | ICD-10-CM | POA: Insufficient documentation

## 2015-03-31 DIAGNOSIS — R945 Abnormal results of liver function studies: Secondary | ICD-10-CM

## 2015-03-31 NOTE — Addendum Note (Signed)
Encounter addended by: Scarlette Calico, RN on: 03/31/2015 10:13 AM<BR>     Documentation filed: [employer], Orders

## 2015-04-01 ENCOUNTER — Ambulatory Visit: Payer: Medicare Other

## 2015-04-01 ENCOUNTER — Telehealth (HOSPITAL_COMMUNITY): Payer: Self-pay | Admitting: *Deleted

## 2015-04-01 DIAGNOSIS — R269 Unspecified abnormalities of gait and mobility: Secondary | ICD-10-CM | POA: Diagnosis not present

## 2015-04-01 DIAGNOSIS — R29898 Other symptoms and signs involving the musculoskeletal system: Secondary | ICD-10-CM

## 2015-04-01 DIAGNOSIS — I4891 Unspecified atrial fibrillation: Secondary | ICD-10-CM

## 2015-04-01 DIAGNOSIS — R2689 Other abnormalities of gait and mobility: Secondary | ICD-10-CM

## 2015-04-01 DIAGNOSIS — R531 Weakness: Secondary | ICD-10-CM

## 2015-04-01 NOTE — Therapy (Signed)
[place], [place], [address] Phone: [phone]   Fax:  [phone]  Physical Therapy Treatment  Patient Details  Name: [name] MRN: [id number] Date of Birth: Dec 22, 1925 Referring Provider:  Cassandria Anger, MD  Encounter Date: 04/01/2015      PT End of Session - 04/01/15 1013    Visit Number 11   Number of Visits 16   Date for PT Re-Evaluation 04/13/15   PT Start Time 0932   PT Stop Time 1015   PT Time Calculation (min) 43 min   Activity Tolerance Patient tolerated treatment well   Behavior During Therapy [facility] for tasks assessed/performed      Past Medical History  Diagnosis Date  . CAD (coronary artery disease)     a. LHC 4/06 with 60% pLAD, 40% ramus, 60% mRCA, and 90% mPDA. No intervention. Inferior akinesis on LV-gram was suggestive of possible inferior MI with recanalization.  . Hyperlipidemia     a. Myalgias with Lipitor and Crestor.  . Hypothyroidism   . Peripheral vascular disease   . Anxiety   . Hx of colonic polyps   . Paroxysmal atrial fibrillation 02/2009     a. With RVR tx with amiodarone returning to NSR 02/2009. b. Had atypical aflutter vs atrial tach 4/14, converted to NSR 4/14. In atypical flutter vs atrial tach in 1/15, DCCV 1/15 to NSR. c. Atrial tach 7/15 with junctional escape 60s.  . Systolic CHF     a.  Probable ischemic cardiomyopathy. Echo (6/10) with EF 40%, mildly dilated LV, moderate MR. ;  b.  echo 3/12: mild LVH, EF 30-35%, IL HK and Lat HK, mod to severe MR, trivial AI, mod LAE  . PNA (pneumonia)     History of   . Mitral regurgitation     a. Moderate by echo 6/10. b. 2-3+ by cath 2006; mod to severe by echo 3/12  . PVC's (premature ventricular contractions)   . Cataract   . Heart murmur   . CKD (chronic kidney disease), stage III   . Skin cancer     Hx of 2008 BCC  . Chronic indwelling Foley catheter     per family, bladder muscles no longer work  .  Depression   . Myocardial infarction   . Basal cell carcinoma   . Paroxysmal atrial tachycardia     a. See details under PAF.  Marland Kitchen Paroxysmal atrial flutter     a. See details under PAF.  Marland Kitchen BPH (benign prostatic hyperplasia)   . Recurrent UTI   . Imbalance   . Carotid stenosis     a. Dopplers 2/15 - 40-59% BICA.    Past Surgical History  Procedure Laterality Date  . Skin cancer excision  2008    shoulder and nose   . Cardioversion N/A 01/02/2013    Procedure: CARDIOVERSION;  Surgeon: Larey Dresser, MD;  Location: [facility];  Service: Cardiovascular;  Laterality: N/A;  . Cardioversion N/A 10/23/2013    Procedure: CARDIOVERSION;  Surgeon: Larey Dresser, MD;  Location: [facility] ENDOSCOPY;  Service: Cardiovascular;  Laterality: N/A;  . Hip arthroplasty Right 08/21/2014    Procedure: ARTHROPLASTY BIPOLAR HIP;  Surgeon: Mcarthur Rossetti, MD;  Location: [facility];  Service: Orthopedics;  Laterality: Right;    There were no vitals filed for this visit.  Visit Diagnosis:  Abnormality of gait  Difficulty balancing when standing  Weakness generalized  Weakness of right lower extremity  Subjective Assessment - 04/01/15 1002    Subjective Tired . Wants an easier treatment today   Patient is accompained by: Interpreter   Currently in Pain? No/denies                         [facility] Adult PT Treatment/Exercise - 04/01/15 1003    Ambulation/Gait   Gait Comments We walked x2 300 feet with verbal cues to not drift to RT.  did not need asist.    Knee/Hip Exercises: Aerobic   Stationary Bike Nustep LE only L 4 6 min   Knee/Hip Exercises: Supine   Bridges Strengthening;Both;1 set;20 reps   Bridges Limitations also x10 RT and Lt foot raise   Other Supine Knee/Hip Exercises On Plates reformer bilaterla LE extension and with RT lleg 4 sets of 12 with change from one res and red and yellos and 2 reds for RT leg. He needed assist to not let feet slide down platform    Knee/Hip Exercises: Sidelying   Hip ABduction Right;1 set;15 reps  verbal and tactile cues for direction of movement   Hip ABduction Limitations cued constantly for movement more into extension                  PT Short Term Goals - 03/30/15 1233    PT SHORT TERM GOAL #1   Title Pt will be independent with initial HEP for strengthening and balance (03/16/15)   Baseline Appears he does with assist at home   Status Partially Met   PT [place] #2   Title Pt will improve Berg to 30/56 to decrease fall risk. (03/16/15)   Baseline 24/56 today    Status On-going   PT SHORT TERM GOAL #3   Title Pt will report </= 4/10 pain with walking of 600 feet to improve mobility within household. 96/22/16)   Baseline No pain but fatigue limited   Status Partially Met           PT Long Term Goals - 03/30/15 1233    PT LONG TERM GOAL #1   Title Pt/caregiver will be independent with advanced HEP. (04/13/15)   Status On-going   PT LONG TERM GOAL #2   Title Pt will improve Berg to >/= 36/56 to decrease fall risk. (04/13/15)   Status On-going   PT LONG TERM GOAL #3   Title Pt will report pain </= 2/10 with walking up to 600' for improved mobility in household. (04/13/15)   Baseline Pain does not limit , fatigue does   Status On-going   PT LONG TERM GOAL #4   Title Pt will improve gait velocity to >/= 2.0 ft/sec for decreased fall risk. (04/13/15)   Status Unable to assess   PT LONG TERM GOAL #5   Title Pt will improve hip strength to >/= 4/5 for improved ability with household tasks. (04/13/15)   Status On-going               Plan - 04/01/15 1013    Clinical Impression Statement He felt session was 4/5 onscale of difficulty. Continues to be weak in RT hip but was able to holld hips off table and lift LT foot wnd stay with hips off table.   PT Next Visit Plan Continue LE stengthening and walking endurance   Consulted and Agree with Plan of Care Patient  interpreter      Measure gait velocity next   Problem List Patient Active Problem  List   Diagnosis Date Noted  . Elevated LFTs 03/31/2015  . Chronic systolic CHF (congestive heart failure) 01/18/2015  . History of right hip hemiarthroplasty   . Closed right hip fracture   . Fall   . Paroxysmal atrial tachycardia   . Paroxysmal atrial flutter   . CKD (chronic kidney disease), stage III   . Encounter for therapeutic drug monitoring 11/03/2013  . Eyelid cyst 07/09/2012  . Insomnia 11/18/2011  . BPH with urinary obstruction 05/14/2011  . HYPERTENSION, BENIGN 08/04/2009  . MITRAL INSUFFICIENCY 04/04/2009  . SYSTOLIC HEART FAILURE, CHRONIC 04/04/2009  . Paroxysmal atrial fibrillation 02/22/2009  . Unsteady gait 10/13/2008  . INSOMNIA, PERSISTENT 02/11/2008  . Hypothyroidism 07/21/2007  . ANXIETY 07/21/2007  . Coronary atherosclerosis 07/21/2007  . PERIPHERAL VASCULAR DISEASE 07/21/2007  . SKIN CANCER, HX OF 07/21/2007  . COLONIC POLYPS, HX OF 07/21/2007    Darrel Hoover PT 04/01/2015, 10:15 AM  [facility] [address] [place], [place], [address] Phone: [phone]   Fax:  [phone]

## 2015-04-01 NOTE — Telephone Encounter (Signed)
Notes Recorded by Scarlette Calico, RN on 04/01/2015 at 1:55 PM Pt's daughter aware and agreeable, order placed for labs on 7/13 when pt is at [facility]

## 2015-04-01 NOTE — Telephone Encounter (Signed)
-----   Message from Larey Dresser, MD sent at 04/01/2015  1:24 PM EDT ----- TSH high, repeat TSH and check free T4 and free T3.

## 2015-04-02 ENCOUNTER — Ambulatory Visit (INDEPENDENT_AMBULATORY_CARE_PROVIDER_SITE_OTHER): Payer: Medicare Other | Admitting: Family Medicine

## 2015-04-02 VITALS — BP 110/58 | HR 54 | Temp 97.7°F | Resp 16 | Wt 122.0 lb

## 2015-04-02 DIAGNOSIS — R319 Hematuria, unspecified: Secondary | ICD-10-CM | POA: Diagnosis not present

## 2015-04-02 DIAGNOSIS — T148XXA Other injury of unspecified body region, initial encounter: Secondary | ICD-10-CM

## 2015-04-02 DIAGNOSIS — N3001 Acute cystitis with hematuria: Secondary | ICD-10-CM | POA: Diagnosis not present

## 2015-04-02 DIAGNOSIS — T148 Other injury of unspecified body region: Secondary | ICD-10-CM | POA: Diagnosis not present

## 2015-04-02 DIAGNOSIS — S3730XA Unspecified injury of urethra, initial encounter: Secondary | ICD-10-CM | POA: Diagnosis not present

## 2015-04-02 LAB — POCT URINALYSIS DIPSTICK
Bilirubin, UA: NEGATIVE
GLUCOSE UA: NEGATIVE
Ketones, UA: NEGATIVE
Nitrite, UA: POSITIVE
Protein, UA: 30
SPEC GRAV UA: 1.01
UROBILINOGEN UA: 0.2
pH, UA: 6

## 2015-04-02 LAB — POCT UA - MICROSCOPIC ONLY
Casts, Ur, LPF, POC: NEGATIVE
Crystals, Ur, HPF, POC: NEGATIVE
Epithelial cells, urine per micros: NEGATIVE
Mucus, UA: NEGATIVE
YEAST UA: NEGATIVE

## 2015-04-02 MED ORDER — CEPHALEXIN 500 MG PO CAPS: 500.0000 mg | ORAL_CAPSULE | Freq: Three times a day (TID) | ORAL | Status: DC

## 2015-04-02 NOTE — Patient Instructions (Signed)
???? ????? ????????? ???????? ????? (Foley Catheter Care) ??????? ????? -- ??? ?????? ?????? ??????, ??????? ??????????? ? ??????? ?????? ??? ?????? ????. ??????? ????? ????? ???? ??????????, ????:   ? ??? ??????????? ?????????????? ?????????????? (?????????? ????).  ?? ?? ?????? ?????????? (???????? ????).  ??? ????????? ???????? ?? ?????????????? ?????? ??? ?? ??????????.  ? ??? ???? ???????????? ???????????, ????????, ?????????? ???????, ????????? ??? ?????? ????????????. ???? ??? ?????????? ????? ? ????????????? ????????? ?????, ???????? ???????????, ??????????? ????.  ???? ?? ????????? 1. ??????? ???? ? ?????. 2. ??? ?????? ?????? ?????, ???? ? ?????? ????:  ???????? ??????? ????, ??????????? ? ????? ????? ???????? ????????? ?????????? ?? ??????????? ?? ????????. ??????? ?? ?????????? ???? ?? ??????????? ? ????????, ?.?. ??? ????? ???????? ? ????, ??? ???????? ??????? ? ?????? ? ??????? ????????.  ????????? ??????? ????. ?????????? ??????? ??????? ?????? ??????. ????????: ??????? ??????? ????? ? ?????????? ?????????. 3. ??????? ?????????? ????????? ?? ???? ????? ???????, ????? ?????? ???????? ?? ????????????. ??????????? ???????? ??? ??????????? ???????. ???? ?? ?????????? ????????, ????? ????????? ?????? ????????, ?? ?????? ??????? ??????? ???? ?? ??????? ???????? ????? ???, ??? ?????????? ?????. 4. ????????? ????? ?????????? ????????? ?????????? ???? ?????? ???????? ??????, ?? ?????? ????????? ??????? ????. 5. ???????????? ? ??????? ??? ??????????, ??? ??????? ????????????? ? ?? ???? ????????? ????. ?????????? ?????? ????????, ????? ??? ?? ???????????????. 6. ?????? ?????? ??????? ??? ????????? ??? ?????. ?? ?????? ????????? ?????????? ??????. ???? ? ?????? ????????? ?????? ?? ?????? ??????? ??? ??????? ??? ????????? ?????. ???? -- ??? ??????? ????????? ?????, ??????? ?????????????? ?? ????, ?????? -- ????? ???????? ??? ??????????? ???????. ?? ?????? ?????? ?????? ????????? ????? ?  ????, ?? ????????? ?????? ???????????? ?????. ???????? ??????????? ?? ???????????, ?????? ? ?????? ????????? ??????.  ??????????? ?????????? ????? ?? ?????? ?????????? ?????, ????? ?? ???????? ?????????? ??? ?? ??? ????????, ?? ?? ???? 2-3 ??? ? ????.  1. ??????? ???? ? ?????. 2. ????? ?????????? ?????????? ???? ?????? ?????, ???? ?????? ???????? ??????. ??? ?? ???????? ???? ??????? ? ?????? ? ??????? ? ??????? ??????. 3. ??? ??????????? ??????? ??????????? ????????? ????? ??? ???????? ??? ??? ?????? ???????????. 4. ???????? ?????? ? ?????? ????? ????? ? ??????? ???? ?? ?????? ????? ? ?????? ??? ? ?????????. ?????? ?????????, ????? ?????? ????? ???????? ???????, ??????? ?????????? ??? ?????? ????? ???????????. ? ????????? ?????? ? ????????? ????? ????? ??????? ???????? ? ??????? ????????. 5. ???????? ??????? ?????? ?????? ??? ?????, ?????????? ? ??????. 6. ???????? ??????? ??????. 7. ?????????? ????????? ????? ? ???? ????????? ??? ??????????? ????????. 8. ????????? ????? ???? ? ?????. ?????? ?????????? ????? ??????? ????????? ????? ??? ? ????? ??? ????, ???? ?? ????? ?????? ??? ??????????. ???? ????????? ?????????? ?? ?????? ?????????? ?????.  1. ??????? ???? ? ?????. 2. ??????? ?????? ????????, ????? ?? ??? ?? ????????? ????. 3. ??????????? ?????? ???????? ?? ????????? ?????? ? ????? ?????????? ????? ??????. ?? ?????????? ??????? ???????? ?????-???? ???????????. 4. ???????? ????? ?????? ???????? ???????. ?????? ?????? ??? ?????, ???????????? ???????, ???????? ????? ????????? ??????. 5. ????????? ?????? ???????? ? ????????? ??????? ??????? ?????????? ?????. 6. ?????????? ?????? ????????? ????? ? ???? ????????? ??? ??????????? ????????. ?????????? ?? ??????? ???? ?????????? ????? ????????? ????? ?? ????. 7. ????????? ????? ???? ? ?????. ?????? ?????????? ????? 1. ??????? ???? ? ?????. 2. ????????? ????? ????????? ????? ?????? ????? ? ?????. 3. ????????? ?????????? ????? ??????  ?????. 4. ??????? ? ????? ??????? ?????? ?????? ? ???? (1 ?????? ?????? ?? 1 ?????? ???? [0,2 ? ?????? ?? 1 ? ?????? ????]). ???????? ????? ? ???????? ??? ? ??????? ?? 30 ?????. 5. ???????? ????? ?????? ?????.  6. ???????? ????? ???????? ??????? ??????? ????, ?????????????? ?????? ??. 7. ???????? ?????? ????????? ????? (????? ????, ??? ?? ????????) ? ?????? ??????????? ?????????. 8. ????????? ????? ???? ? ?????. ???????????? ???????? ????????   ????? ???? ?? ? ????? ????????? ? ?????????.  ????????? ?????????? ??? ? ????? ?????, ??? ??????? ?????? ? ????. ?? ?????????? ?????. ??????? ?????? ??????? ?? ?????, ???? ????????? ????? ????????? ?? ???? ??????????? ????????.  ?????? ???????????? ???????, ????? ??? ???? ?? ??????????. ?????????? ?????? ????? ??? ????, ??????? ???? ???????? ??????? ??????.  ???????? ?????????? ??????????? ????? ????????? ?? ??????????? ?? ?????? ?????.  ????? ?????, ????? ???? ???? ????????? ?????????? ??? ??????-??????, ???? ? ??? ??? ??????????? ?? ?????? ????????.  ?????? ?????????, ????? ????????? ????? ??? ??????? ?????? ???????? ??? ?????? ?? ????.  ?????? ????? ?? ??????, ????? ??? ????????? ????? ? ???????? ???? ?????????? ?????. ?????????? ? ?????, ????:   ???? ????? ?????? ??? ???????? ????? ??????.  ??????? ?????????.  ???? ?? ????????? ? ????????? ?????, ??? ? ??? ???? ???????? ??????????????? ???????? ??????.  ??????? ????? ?????????. ?????????? ?????????? ? ????? ??? ????????? ?????? ??????, ????:   ? ????? ????? ???????? ? ???? ?? ??????????? ????, ??? ????????? ????, ??????????? ??? ????????? ????.  ?? ??????????? ???? ? ??????? ???????, ?????, ? ?????? ????? ????? ??? ? ?????? ???????? ??????.  ? ??? ?????????? ???????????.  ? ???????? ???? ?????, ??? ???? ????????? ??????? ??? ??????? ????.  ? ??? ???????? ???????, ????? ??? ?????.  ??????? ?????. ?????????, ??? ??:   ????????? ????????? ??????????.  ?????? ??????????????  ??????? ?? ??????????.  ??????????????? ?????????? ? ?????, ???? ??? ?? ?????????? ????? ??? ?????????? ????. Document Released: 07/08/2009 Document Revised: 01/25/2014 [facility] Patient Information 2015 [place], [place]. This information is not intended to replace advice given to you by your health care provider. Make sure you discuss any questions you have with your health care provider. ?????????? ??????? ??????? (Indwelling Urinary Catheter Care) ??? ? ??????? ?????? ???????? ?????? ?????????? ?????? (???????) ??? ??????????? ???????? ??????. ??????? ??????????????? ? ??? ???????, ????? ???????? ???????? ?? ????? ?????????????? ????????? ???????? ???? ??-?? ????????? ????????????? ?????, ??????????? ????????????, ????????????? ?????????? ??? ??? ?????????? ???? ??? ?????. ?? ????????????? ????????? ?????????? ????????? ?????????, ????? ?? ????????? ???????? ???????? ? ?????????. ???????????? ?? ????? ? ???????? ???????? ?????????? ????????????? ????????? ????, ????? ??????? ???? ????????. ????? ???????????? ?? ?????? ????????????? ???????? ????????. ?????????? ????????? ???????? ? ??????? ?????? ????????? ????? ???? ? ????? ? ??????? ???? ????? ?????? ??? ?? ? ????? ????????????? ? ???????? ??????. ?????? ? ???? ?????????? ?????? ????? ??????? ??????? ? ???? ???????, ? ????? ????? ??????? ??????????? ?????????. ?????????? ?????? ?????? ??? ????? ????? ???????????? ????????? ?????? ????? ? ?????; ????????, ? ????? ??????? ????? ????????? ?????????. ??? ?????? ?????????? ????, ????????????? ?? ????, ?? ?????? ?????????, ??? ????????, ????????? ???????? ????????? ????????? ??? ?????????? ?? ??????? ?????????? ????????, ??????? ?????????????? ????? ?????????? ? ??????????. ?????????? ?????? ???? ? ??????????? ??????? ?????. ????????? ????? ????????????? ? (? ??? ????? ??????). ?????? ?????? ?????????? ???????? ?????????????? ????????????? ?? ?????. ?????????? ?????? ??????? ?????? ???????? ?  ???????????? ???? ?????? ???????? ??????. ????????? ????? ???? ????? ???????? ????????? ? ????????? ????? (?????????). ???? ??????????? ????? ??? ?????? ???? ?????? ???????? ??????, ???? ??????? ??????? ? ??????? ??????. ???? ???????? ???????????? ?????????? ????????????, ????????? ???????, ??? ???????????? ????????? ???? ?? ????????????? ? ??????? ??????. ????? ?????????? ???? ? ???? ????????, ????? ???? ???? ??? ?????????? ??? ??????-??????, ??? ???????? ????????????? ?????. ??? ????? ???????? ???????? ?? ???????? ??????. ????????????? ??????????? ?????? ?????? ???????? ??????? ?????????? ????????? ?? ???? ????? ???????, ????? ?????? ???????? ?? ????????????. ??????????? ???????? ??? ??????????? ???????. ???? ?? ?????????? ????????, ????? ????????? ?????? ????????, ?? ?????? ??????? ??????? ???? ?? ??????? ???????? ????? ???, ??? ?????????? ?????. ????????? ????? ?? ?????? ????? ????. ??????? ????????? ??????. ?????? ?????????????? ???????? ???????, ???? ??? ?? ???? ???? ??????????? ???????? ?? ????? ??????. ?? ??????????? ????????????? ???? ???????????? ?????????? ?????? ? ??????? ??? ????, ????? ?????????, ??? ??? ????????? ????????, ? ???? ???????? ????????? ? ????????? ?????. ?? ?????????? ???????? ??????????. ??????? ?? ???, ????? ???????????? ?? ??? ??????????. ?????????? ?????????? ? ?????, ????: ??????? ???????. ??? ??????????? ????. ??????? ?????????. ? ??? ????????? ????. ? ??? ?????????? ???????????.  Document Released: 09/10/2005 Document Revised: 08/27/2012 [facility] Patient Information 2015 [place]. This information is not intended to replace advice given to you by your health care provider. Make sure you discuss any questions you have with your health care provider.

## 2015-04-02 NOTE — Progress Notes (Addendum)
Subjective:  This chart was scribed for Delman Cheadle, MD by Thea Alken, ED Scribe. This patient was seen in room 2 and the patient's care was started at 9:33 AM.   Patient ID: [name], male    DOB: [birth year], 81 y.o.   MRN: [id number]  HPI Chief Complaint  Patient presents with  . Hematuria   HPI Comments: [name] is a 81 y.o. male who presents to the [facility] complaining of blood in urine. Pt is anit-coagulated and takes warfarin. INR checked 10 days ago in normal rate of 2.4. He has stage 3 renal failure. Renal function checked 1 month prior with creatine of 1.5. He is also chronic anemic ranging between 10.3-12.5.  Pt has had urine catheter for 5 years for lack of bladder elasticity. He has catheter changed every month and recently had it changed 1 day ago by a nurse. States after catheter was replaced it was flushed and everything was clear. Pt began to notice blood around urethra and catheter later that evening and had trouble sleeping due to worrying about the bleeding. When his son checked his bag this morning, he noticed sweet tea colored urine in bag. Pt denies penile pain and discomfort at this time but did have pain while the catheter was being changed yesterday. Pt has had bladder infection in the past and had intolerance with cipro in the past. He has upcoming liver testing. He denies fever, chills, nausea, emesis, fatigue, change in appetite, and abdominal pain.   Pt does not speak english and history was translated by a family member.   Past Medical History  Diagnosis Date  . CAD (coronary artery disease)     a. LHC 4/06 with 60% pLAD, 40% ramus, 60% mRCA, and 90% mPDA. No intervention. Inferior akinesis on LV-gram was suggestive of possible inferior MI with recanalization.  . Hyperlipidemia     a. Myalgias with Lipitor and Crestor.  . Hypothyroidism   . Peripheral vascular disease   . Anxiety   . Hx of colonic polyps   . Paroxysmal  atrial fibrillation 02/2009     a. With RVR tx with amiodarone returning to NSR 02/2009. b. Had atypical aflutter vs atrial tach 4/14, converted to NSR 4/14. In atypical flutter vs atrial tach in 1/15, DCCV 1/15 to NSR. c. Atrial tach 7/15 with junctional escape 60s.  . Systolic CHF     a.  Probable ischemic cardiomyopathy. Echo (6/10) with EF 40%, mildly dilated LV, moderate MR. ;  b.  echo 3/12: mild LVH, EF 30-35%, IL HK and Lat HK, mod to severe MR, trivial AI, mod LAE  . PNA (pneumonia)     History of   . Mitral regurgitation     a. Moderate by echo 6/10. b. 2-3+ by cath 2006; mod to severe by echo 3/12  . PVC's (premature ventricular contractions)   . Cataract   . Heart murmur   . CKD (chronic kidney disease), stage III   . Skin cancer     Hx of 2008 BCC  . Chronic indwelling Foley catheter     per family, bladder muscles no longer work  . Depression   . Myocardial infarction   . Basal cell carcinoma   . Paroxysmal atrial tachycardia     a. See details under PAF.  Marland Kitchen Paroxysmal atrial flutter     a. See details under PAF.  Marland Kitchen BPH (benign prostatic hyperplasia)   . Recurrent UTI   .  Imbalance   . Carotid stenosis     a. Dopplers 2/15 - 40-59% BICA.   Past Surgical History  Procedure Laterality Date  . Skin cancer excision  2008    shoulder and nose   . Cardioversion N/A 01/02/2013    Procedure: CARDIOVERSION;  Surgeon: Larey Dresser, MD;  Location: [place];  Service: Cardiovascular;  Laterality: N/A;  . Cardioversion N/A 10/23/2013    Procedure: CARDIOVERSION;  Surgeon: Larey Dresser, MD;  Location: [facility];  Service: Cardiovascular;  Laterality: N/A;  . Hip arthroplasty Right 08/21/2014    Procedure: ARTHROPLASTY BIPOLAR HIP;  Surgeon: Mcarthur Rossetti, MD;  Location: [place];  Service: Orthopedics;  Laterality: Right;   Prior to Admission medications   Medication Sig Start Date End Date Taking? Authorizing Provider  amiodarone (PACERONE) 100 MG tablet Take  200 mg by mouth daily.    Yes Historical Provider, MD  Control Gel Formula Dressing (DUODERM CGF DRESSING) MISC Apply 2 each topically 2 (two) times a week. 09/30/14  Yes [name], MD  digoxin (LANOXIN) 0.125 MG tablet Take 0.5 tablets (0.0625 mg total) by mouth every other day. Patient taking differently: Take 0.0625 mg by mouth every other day. Takes at noon 07/05/14  Yes Larey Dresser, MD  furosemide (LASIX) 20 MG tablet Take 1 tablet (20 mg total) by mouth daily. Patient taking differently: Take 20 mg by mouth every evening.  10/14/14  Yes [name], MD  furosemide (LASIX) 40 MG tablet Take one tab( 40 mg) in the AM 10/14/14  Yes [name], MD  levothyroxine (SYNTHROID, LEVOTHROID) 125 MCG tablet TAKE 1 TABLET (125 MCG TOTAL) BY MOUTH DAILY BEFORE BREAKFAST. 11/08/14  Yes [name], MD  lisinopril (PRINIVIL,ZESTRIL) 2.5 MG tablet TAKE 1 TABLET (2.5 MG TOTAL) BY MOUTH DAILY. 01/06/15  Yes Shaune Pascal Bensimhon, MD  LORazepam (ATIVAN) 0.5 MG tablet Take 0.5 mg by mouth 2 (two) times daily.   Yes Historical Provider, MD  metoprolol succinate (TOPROL XL) 25 MG 24 hr tablet Take 0.5 tablets (12.5 mg total) by mouth daily. 10/13/14  Yes Larey Dresser, MD  MILK THISTLE PO Take 325 mg by mouth 3 (three) times daily.   Yes Historical Provider, MD  Multiple Vitamin (MULTIVITAMIN WITH MINERALS) TABS tablet Take 1 tablet by mouth daily.   Yes Historical Provider, MD  Omega-3 Fatty Acids (FISH OIL) 1200 MG CAPS Take 1 capsule (1,200 mg total) by mouth daily. 08/27/13  Yes [name], MD  potassium chloride (K-DUR,KLOR-CON) 10 MEQ tablet Take 2 tablets (20 mEq total) by mouth daily. 10/29/14  Yes Jolaine Artist, MD  Sennosides-Docusate Sodium (SENNA) 8.6-50 MG TABS Take 2 tablets by mouth daily.   Yes Historical Provider, MD  tamsulosin (FLOMAX) 0.4 MG CAPS capsule Take 0.4 mg by mouth at bedtime.  12/24/13  Yes [name], MD  warfarin (COUMADIN) 1 MG tablet Take 1 tablet (1 mg  total) by mouth daily. 02/07/15  Yes Larey Dresser, MD  zolpidem (AMBIEN) 5 MG tablet Take 1 tablet (5 mg total) by mouth at bedtime as needed for sleep. 10/14/14  Yes [name], MD   Review of Systems  Constitutional: Negative for fever, chills, appetite change and fatigue.  Gastrointestinal: Negative for nausea, vomiting and abdominal pain.  Genitourinary: Positive for hematuria, difficulty urinating and genital sores. Negative for dysuria, flank pain, decreased urine volume, penile swelling, penile pain and testicular pain.  Skin: Positive for wound. Negative for color change and rash.  Neurological: Positive for weakness.  Hematological: Bruises/bleeds easily.  Psychiatric/Behavioral: Positive for sleep disturbance.   Objective:   Physical Exam  Constitutional: He is oriented to person, place, and time. He appears well-developed and well-nourished. No distress.  HENT:  Head: Normocephalic and atraumatic.  Eyes: Conjunctivae and EOM are normal.  Neck: Neck supple.  Cardiovascular: Normal rate.   Pulmonary/Chest: Effort normal.  Genitourinary:  Small amount of dried blood around the urethra extending down the catheter about 1 in with dried blood staining perineum and underwear. Catheter was mobile and easily able to be slid into bladder without recurrence of pain. Urine appeared to be concentrated. No gross blood in urine.  Musculoskeletal: Normal range of motion.  Neurological: He is alert and oriented to person, place, and time.  Skin: Skin is warm and dry.  Psychiatric: He has a normal mood and affect. His behavior is normal.  Nursing note and vitals reviewed.  Filed Vitals:   04/02/15 0910  BP: 110/58  Pulse: 54  Temp: 97.7 F (36.5 C)  TempSrc: Oral  Resp: 16  Weight: 122 lb (55.339 kg)  SpO2: 97%    Results for orders placed or performed in visit on 04/02/15  POCT urinalysis dipstick  Result Value Ref Range   Color, UA yellow    Clarity, UA cloudy     Glucose, UA neg    Bilirubin, UA neg    Ketones, UA neg    Spec Grav, UA 1.010    Blood, UA large    pH, UA 6.0    Protein, UA 30    Urobilinogen, UA 0.2    Nitrite, UA positive    Leukocytes, UA moderate (2+) (A) Negative  POCT UA - Microscopic Only  Result Value Ref Range   WBC, Ur, HPF, POC TNTC    RBC, urine, microscopic TNTC    Bacteria, U Microscopic large    Mucus, UA neg    Epithelial cells, urine per micros neg    Crystals, Ur, HPF, POC neg    Casts, Ur, LPF, POC neg    Yeast, UA neg    Assessment & Plan:   1. Hematuria   2. Acute cystitis with hematuria   3. Traumatic abrasion   4. Trauma of urethra, initial encounter   Internal urethral injury/abrasion during foley cath change yesterday. Still with some bright red bleeding from meatus around catheter but catheter easily manipulated/ reduced in bladder w/o increased bleeding, pain, or restriction.  Advise to leave catheter in place while healing and try to not manipulate. RTC or urology asap if persists after 48 hrs.  Orders Placed This Encounter  Procedures  . Urine culture  . POCT urinalysis dipstick  . POCT UA - Microscopic Only    Meds ordered this encounter  Medications  . cephALEXin (KEFLEX) 500 MG capsule    Sig: Take 1 capsule (500 mg total) by mouth 3 (three) times daily.    Dispense:  21 capsule    Refill:  0    I personally performed the services described in this documentation, which was scribed in my presence. The recorded information has been reviewed and considered, and addended by me as needed.  Delman Cheadle, MD MPH  Results for orders placed or performed in visit on 04/02/15  Urine culture  Result Value Ref Range   Culture KLEBSIELLA PNEUMONIAE ESCHERICHIA COLI     Colony Count >=100,000 COLONIES/ML    Organism ID, Bacteria KLEBSIELLA PNEUMONIAE    Organism ID, Bacteria ESCHERICHIA  COLI       Susceptibility   Escherichia coli -  (no method available)    AMPICILLIN >=32 Resistant      AMOX/CLAVULANIC 4 Sensitive     AMPICILLIN/SULBACTAM >=32 Resistant     PIP/TAZO <=4 Sensitive     IMIPENEM <=0.25 Sensitive     CEFAZOLIN <=4 Sensitive     CEFTRIAXONE <=1 Sensitive     CEFTAZIDIME <=1 Sensitive     CEFEPIME <=1 Sensitive     GENTAMICIN <=1 Sensitive     TOBRAMYCIN <=1 Sensitive     CIPROFLOXACIN <=0.25 Sensitive     LEVOFLOXACIN <=0.12 Sensitive     NITROFURANTOIN <=16 Sensitive     TRIMETH/SULFA <=20 Sensitive    Klebsiella pneumoniae -  (no method available)    AMPICILLIN  Resistant     AMOX/CLAVULANIC <=2 Sensitive     AMPICILLIN/SULBACTAM <=2 Sensitive     PIP/TAZO <=4 Sensitive     IMIPENEM <=0.25 Sensitive     CEFAZOLIN <=4 Sensitive     CEFTRIAXONE <=1 Sensitive     CEFTAZIDIME <=1 Sensitive     CEFEPIME <=1 Sensitive     GENTAMICIN <=1 Sensitive     TOBRAMYCIN <=1 Sensitive     CIPROFLOXACIN <=0.25 Sensitive     LEVOFLOXACIN <=0.12 Sensitive     NITROFURANTOIN 32 Sensitive     TRIMETH/SULFA <=20 Sensitive   POCT urinalysis dipstick  Result Value Ref Range   Color, UA yellow    Clarity, UA cloudy    Glucose, UA neg    Bilirubin, UA neg    Ketones, UA neg    Spec Grav, UA 1.010    Blood, UA large    pH, UA 6.0    Protein, UA 30    Urobilinogen, UA 0.2    Nitrite, UA positive    Leukocytes, UA moderate (2+) (A) Negative  POCT UA - Microscopic Only  Result Value Ref Range   WBC, Ur, HPF, POC TNTC    RBC, urine, microscopic TNTC    Bacteria, U Microscopic large    Mucus, UA neg    Epithelial cells, urine per micros neg    Crystals, Ur, HPF, POC neg    Casts, Ur, LPF, POC neg    Yeast, UA neg

## 2015-04-04 ENCOUNTER — Ambulatory Visit (HOSPITAL_COMMUNITY)
Admission: RE | Admit: 2015-04-04 | Discharge: 2015-04-04 | Disposition: A | Discharge status: Home / Self Care | Payer: Medicare Other | Source: Ambulatory Visit | Attending: Cardiology | Admitting: Cardiology

## 2015-04-04 ENCOUNTER — Telehealth: Payer: Self-pay | Admitting: Cardiology

## 2015-04-04 DIAGNOSIS — N183 Chronic kidney disease, stage 3 (moderate): Secondary | ICD-10-CM | POA: Insufficient documentation

## 2015-04-04 DIAGNOSIS — N281 Cyst of kidney, acquired: Secondary | ICD-10-CM | POA: Diagnosis not present

## 2015-04-04 DIAGNOSIS — R7989 Other specified abnormal findings of blood chemistry: Secondary | ICD-10-CM | POA: Diagnosis not present

## 2015-04-04 DIAGNOSIS — K8020 Calculus of gallbladder without cholecystitis without obstruction: Secondary | ICD-10-CM | POA: Diagnosis not present

## 2015-04-04 DIAGNOSIS — R945 Abnormal results of liver function studies: Secondary | ICD-10-CM

## 2015-04-04 NOTE — Telephone Encounter (Signed)
[facility]  RN [facility] calling to give report concerning recent test. Please call back and discuss.

## 2015-04-04 NOTE — Telephone Encounter (Signed)
Spoke w/[facility] they will fax abd u/s results

## 2015-04-05 LAB — URINE CULTURE: Colony Count: >=100000

## 2015-04-06 ENCOUNTER — Ambulatory Visit (INDEPENDENT_AMBULATORY_CARE_PROVIDER_SITE_OTHER): Payer: Medicare Other | Admitting: *Deleted

## 2015-04-06 ENCOUNTER — Other Ambulatory Visit (INDEPENDENT_AMBULATORY_CARE_PROVIDER_SITE_OTHER): Payer: Medicare Other | Admitting: *Deleted

## 2015-04-06 ENCOUNTER — Encounter: Payer: Self-pay | Admitting: Cardiology

## 2015-04-06 DIAGNOSIS — I480 Paroxysmal atrial fibrillation: Secondary | ICD-10-CM | POA: Diagnosis not present

## 2015-04-06 DIAGNOSIS — Z5181 Encounter for therapeutic drug level monitoring: Secondary | ICD-10-CM

## 2015-04-06 DIAGNOSIS — I4891 Unspecified atrial fibrillation: Secondary | ICD-10-CM | POA: Diagnosis not present

## 2015-04-06 LAB — T4, FREE: Free T4: 1.42 ng/dL (ref 0.60–1.60)

## 2015-04-06 LAB — TSH: TSH: 10.7 u[IU]/mL — ABNORMAL HIGH (ref 0.35–4.50)

## 2015-04-06 LAB — POCT INR: INR: 2.3

## 2015-04-06 LAB — T3, FREE: T3, Free: 1.9 pg/mL — ABNORMAL LOW (ref 2.3–4.2)

## 2015-04-06 NOTE — Addendum Note (Signed)
Addended by: Eulis Foster on: 04/06/2015 12:06 PM   Modules accepted: Orders

## 2015-04-07 ENCOUNTER — Ambulatory Visit: Payer: Medicare Other | Admitting: Physical Therapy

## 2015-04-07 ENCOUNTER — Other Ambulatory Visit (HOSPITAL_COMMUNITY): Payer: Self-pay

## 2015-04-07 DIAGNOSIS — R2689 Other abnormalities of gait and mobility: Secondary | ICD-10-CM

## 2015-04-07 DIAGNOSIS — R531 Weakness: Secondary | ICD-10-CM

## 2015-04-07 DIAGNOSIS — R29898 Other symptoms and signs involving the musculoskeletal system: Secondary | ICD-10-CM

## 2015-04-07 DIAGNOSIS — R269 Unspecified abnormalities of gait and mobility: Secondary | ICD-10-CM

## 2015-04-07 DIAGNOSIS — R293 Abnormal posture: Secondary | ICD-10-CM

## 2015-04-07 MED ORDER — LEVOTHYROXINE SODIUM 150 MCG PO TABS: 150.0000 ug | ORAL_TABLET | Freq: Every day | ORAL | Status: DC

## 2015-04-07 NOTE — Therapy (Signed)
[place], [place], [address] Phone: [phone]   Fax:  [phone]  Physical Therapy Treatment  Patient Details  Name: [name] MRN: [id number] Date of Birth: 28-Nov-1925 Referring Provider:  Cassandria Anger, MD  Encounter Date: 04/07/2015      PT End of Session - 04/07/15 1420    Visit Number 12   Number of Visits 16   Date for PT Re-Evaluation 04/13/15   Authorization Type Medicare/Medicaid, KX at Visit 12 due to prior HHPT   Authorization Time Period annual   Authorization - Number of Visits 12   PT Start Time 1418   PT Stop Time 1500   PT Time Calculation (min) 42 min   Equipment Utilized During Treatment Gait belt   Behavior During Therapy [facility] for tasks assessed/performed      Past Medical History  Diagnosis Date  . CAD (coronary artery disease)     a. LHC 4/06 with 60% pLAD, 40% ramus, 60% mRCA, and 90% mPDA. No intervention. Inferior akinesis on LV-gram was suggestive of possible inferior MI with recanalization.  . Hyperlipidemia     a. Myalgias with Lipitor and Crestor.  . Hypothyroidism   . Peripheral vascular disease   . Anxiety   . Hx of colonic polyps   . Paroxysmal atrial fibrillation 02/2009     a. With RVR tx with amiodarone returning to NSR 02/2009. b. Had atypical aflutter vs atrial tach 4/14, converted to NSR 4/14. In atypical flutter vs atrial tach in 1/15, DCCV 1/15 to NSR. c. Atrial tach 7/15 with junctional escape 60s.  . Systolic CHF     a.  Probable ischemic cardiomyopathy. Echo (6/10) with EF 40%, mildly dilated LV, moderate MR. ;  b.  echo 3/12: mild LVH, EF 30-35%, IL HK and Lat HK, mod to severe MR, trivial AI, mod LAE  . PNA (pneumonia)     History of   . Mitral regurgitation     a. Moderate by echo 6/10. b. 2-3+ by cath 2006; mod to severe by echo 3/12  . PVC's (premature ventricular contractions)   . Cataract   . Heart murmur   . CKD (chronic kidney  disease), stage III   . Skin cancer     Hx of 2008 BCC  . Chronic indwelling Foley catheter     per family, bladder muscles no longer work  . Depression   . Myocardial infarction   . Basal cell carcinoma   . Paroxysmal atrial tachycardia     a. See details under PAF.  Marland Kitchen Paroxysmal atrial flutter     a. See details under PAF.  Marland Kitchen BPH (benign prostatic hyperplasia)   . Recurrent UTI   . Imbalance   . Carotid stenosis     a. Dopplers 2/15 - 40-59% BICA.    Past Surgical History  Procedure Laterality Date  . Skin cancer excision  2008    shoulder and nose   . Cardioversion N/A 01/02/2013    Procedure: CARDIOVERSION;  Surgeon: Larey Dresser, MD;  Location: [place];  Service: Cardiovascular;  Laterality: N/A;  . Cardioversion N/A 10/23/2013    Procedure: CARDIOVERSION;  Surgeon: Larey Dresser, MD;  Location: [facility] ENDOSCOPY;  Service: Cardiovascular;  Laterality: N/A;  . Hip arthroplasty Right 08/21/2014    Procedure: ARTHROPLASTY BIPOLAR HIP;  Surgeon: Mcarthur Rossetti, MD;  Location: [place];  Service: Orthopedics;  Laterality: Right;    There were no vitals filed for  this visit.  Visit Diagnosis:  Abnormality of gait  Difficulty balancing when standing  Weakness generalized  Weakness of right lower extremity  Abnormal posture      Subjective Assessment - 04/07/15 1419    Subjective Ok. 50/50 tired today. feels better than last session   Patient is accompained by: Interpreter   Limitations Walking;Standing   How long can you stand comfortably? 5   Patient Stated Goals improve balance and walking   Multiple Pain Sites No                         [facility] Adult PT Treatment/Exercise - 04/07/15 0001    High Level Balance   High Level Balance Activities Backward walking  parallell   High Level Balance Comments side stepping  // bars   Lumbar Exercises: Standing   Heel Raises 10 reps  // bars   Forward Lunge 10 reps  // bars   Knee/Hip  Exercises: Aerobic   Stationary Bike NuStep L4X6   Knee/Hip Exercises: Standing   Heel Raises Both;10 reps  //bars   Forward Step Up 10 reps;Right;Left   toe touch to step in // bars                  PT Short Term Goals - 03/30/15 1233    PT SHORT TERM GOAL #1   Title Pt will be independent with initial HEP for strengthening and balance (03/16/15)   Baseline Appears he does with assist at home   Status Partially Met   PT SHORT TERM GOAL #2   Title Pt will improve Berg to 30/56 to decrease fall risk. (03/16/15)   Baseline 24/56 today    Status On-going   PT SHORT TERM GOAL #3   Title Pt will report </= 4/10 pain with walking of 600 feet to improve mobility within household. 96/22/16)   Baseline No pain but fatigue limited   Status Partially Met           PT Long Term Goals - 03/30/15 1233    PT LONG TERM GOAL #1   Title Pt/caregiver will be independent with advanced HEP. (04/13/15)   Status On-going   PT LONG TERM GOAL #2   Title Pt will improve Berg to >/= 36/56 to decrease fall risk. (04/13/15)   Status On-going   PT LONG TERM GOAL #3   Title Pt will report pain </= 2/10 with walking up to 600' for improved mobility in household. (04/13/15)   Baseline Pain does not limit , fatigue does   Status On-going   PT LONG TERM GOAL #4   Title Pt will improve gait velocity to >/= 2.0 ft/sec for decreased fall risk. (04/13/15)   Status Unable to assess   PT LONG TERM GOAL #5   Title Pt will improve hip strength to >/= 4/5 for improved ability with household tasks. (04/13/15)   Status On-going               Plan - 04/07/15 1456    Clinical Impression Statement He did well today in parallel bars for exercises in standing for strength and endurance. He said it was difficult but good.   Pt will benefit from skilled therapeutic intervention in order to improve on the following deficits Abnormal gait;Decreased activity tolerance;Decreased balance;Decreased  endurance;Decreased mobility;Decreased safety awareness;Dizziness;Difficulty walking;Decreased strength;Postural dysfunction;Pain   Rehab Potential Good   PT Duration 3 weeks   PT Treatment/Interventions ADLs/Self Care Home Management;Cryotherapy;Dealer  Stimulation;Moist Heat;Ultrasound;Gait training;Functional mobility training;Therapeutic activities;Therapeutic exercise;Balance training;Neuromuscular re-education;Patient/family education;Manual techniques;Vestibular   PT Next Visit Plan Continue LE stengthening and walking endurance   PT Home Exercise Plan Needs to do HEP 2x/day   Consulted and Agree with Plan of Care Patient        Problem List Patient Active Problem List   Diagnosis Date Noted  . Elevated LFTs 03/31/2015  . Chronic systolic CHF (congestive heart failure) 01/18/2015  . History of right hip hemiarthroplasty   . Closed right hip fracture   . Fall   . Paroxysmal atrial tachycardia   . Paroxysmal atrial flutter   . CKD (chronic kidney disease), stage III   . Encounter for therapeutic drug monitoring 11/03/2013  . Eyelid cyst 07/09/2012  . Insomnia 11/18/2011  . BPH with urinary obstruction 05/14/2011  . HYPERTENSION, BENIGN 08/04/2009  . MITRAL INSUFFICIENCY 04/04/2009  . SYSTOLIC HEART FAILURE, CHRONIC 04/04/2009  . Paroxysmal atrial fibrillation 02/22/2009  . Unsteady gait 10/13/2008  . INSOMNIA, PERSISTENT 02/11/2008  . Hypothyroidism 07/21/2007  . ANXIETY 07/21/2007  . Coronary atherosclerosis 07/21/2007  . PERIPHERAL VASCULAR DISEASE 07/21/2007  . SKIN CANCER, HX OF 07/21/2007  . COLONIC POLYPS, HX OF 07/21/2007     Natividad Brood, PTA  04/07/2015, 3:01 PM  [facility] [address] [place], [place], [address] Phone: [phone]   Fax:  [phone]

## 2015-04-08 ENCOUNTER — Telehealth (HOSPITAL_COMMUNITY): Payer: Self-pay | Admitting: *Deleted

## 2015-04-08 NOTE — Telephone Encounter (Signed)
-----   Message from Larey Dresser, MD sent at 04/07/2015  2:01 PM EDT ----- Please have Mr Medlen increase his Levoxyl to 150 daily with TSH in 1 month.  He does not want to do the GI appt.

## 2015-04-08 NOTE — Telephone Encounter (Signed)
All done

## 2015-04-09 ENCOUNTER — Other Ambulatory Visit: Payer: Self-pay | Admitting: Internal Medicine

## 2015-04-11 ENCOUNTER — Ambulatory Visit: Payer: Medicare Other | Admitting: Physical Therapy

## 2015-04-11 DIAGNOSIS — R2689 Other abnormalities of gait and mobility: Secondary | ICD-10-CM

## 2015-04-11 DIAGNOSIS — R269 Unspecified abnormalities of gait and mobility: Secondary | ICD-10-CM | POA: Diagnosis not present

## 2015-04-11 DIAGNOSIS — R29898 Other symptoms and signs involving the musculoskeletal system: Secondary | ICD-10-CM

## 2015-04-11 DIAGNOSIS — R531 Weakness: Secondary | ICD-10-CM

## 2015-04-11 DIAGNOSIS — R293 Abnormal posture: Secondary | ICD-10-CM

## 2015-04-11 NOTE — Therapy (Signed)
[place], [place], [address] Phone: [phone]   Fax:  [phone]  Physical Therapy Treatment  Patient Details  Name: [name] MRN: [id number] Date of Birth: February 26, 1926 Referring Provider:  Cassandria Anger, MD  Encounter Date: 04/11/2015      PT End of Session - 04/11/15 1333    Visit Number 13   Number of Visits 16   Date for PT Re-Evaluation 04/13/15   Authorization Type Medicare/Medicaid, KX at Visit 12 due to prior HHPT   Authorization Time Period annual   Authorization - Number of Visits 12   PT Start Time 1333   PT Stop Time 1415   PT Time Calculation (min) 42 min      Past Medical History  Diagnosis Date  . CAD (coronary artery disease)     a. LHC 4/06 with 60% pLAD, 40% ramus, 60% mRCA, and 90% mPDA. No intervention. Inferior akinesis on LV-gram was suggestive of possible inferior MI with recanalization.  . Hyperlipidemia     a. Myalgias with Lipitor and Crestor.  . Hypothyroidism   . Peripheral vascular disease   . Anxiety   . Hx of colonic polyps   . Paroxysmal atrial fibrillation 02/2009     a. With RVR tx with amiodarone returning to NSR 02/2009. b. Had atypical aflutter vs atrial tach 4/14, converted to NSR 4/14. In atypical flutter vs atrial tach in 1/15, DCCV 1/15 to NSR. c. Atrial tach 7/15 with junctional escape 60s.  . Systolic CHF     a.  Probable ischemic cardiomyopathy. Echo (6/10) with EF 40%, mildly dilated LV, moderate MR. ;  b.  echo 3/12: mild LVH, EF 30-35%, IL HK and Lat HK, mod to severe MR, trivial AI, mod LAE  . PNA (pneumonia)     History of   . Mitral regurgitation     a. Moderate by echo 6/10. b. 2-3+ by cath 2006; mod to severe by echo 3/12  . PVC's (premature ventricular contractions)   . Cataract   . Heart murmur   . CKD (chronic kidney disease), stage III   . Skin cancer     Hx of 2008 BCC  . Chronic indwelling Foley catheter     per family,  bladder muscles no longer work  . Depression   . Myocardial infarction   . Basal cell carcinoma   . Paroxysmal atrial tachycardia     a. See details under PAF.  Marland Kitchen Paroxysmal atrial flutter     a. See details under PAF.  Marland Kitchen BPH (benign prostatic hyperplasia)   . Recurrent UTI   . Imbalance   . Carotid stenosis     a. Dopplers 2/15 - 40-59% BICA.    Past Surgical History  Procedure Laterality Date  . Skin cancer excision  2008    shoulder and nose   . Cardioversion N/A 01/02/2013    Procedure: CARDIOVERSION;  Surgeon: Larey Dresser, MD;  Location: [place];  Service: Cardiovascular;  Laterality: N/A;  . Cardioversion N/A 10/23/2013    Procedure: CARDIOVERSION;  Surgeon: Larey Dresser, MD;  Location: [facility] ENDOSCOPY;  Service: Cardiovascular;  Laterality: N/A;  . Hip arthroplasty Right 08/21/2014    Procedure: ARTHROPLASTY BIPOLAR HIP;  Surgeon: Mcarthur Rossetti, MD;  Location: [place];  Service: Orthopedics;  Laterality: Right;    There were no vitals filed for this visit.  Visit Diagnosis:  Abnormality of gait  Difficulty balancing when standing  Weakness generalized  Abnormal posture  Weakness of right lower extremity      Subjective Assessment - 04/11/15 1338    Subjective slight pain in Right hip. did not exercise this morning and thinks that is the reason. per interpretor. right leg buckles when walking.   Patient is accompained by: Interpreter   Limitations Walking;Standing   How long can you stand comfortably? 5   Patient Stated Goals improve balance and walking   Multiple Pain Sites No                         [facility] Adult PT Treatment/Exercise - 04/11/15 0001    Ambulation/Gait   Ambulation/Gait Yes   Ambulation/Gait Assistance 5: Supervision   Assistive device Rolling walker   Ambulation Surface Level   Gait Comments stands upright posture and stays in walker for safety   Lumbar Exercises: Seated   LAQ on Chair Weights (lbs) 10 X  2 with 5#'s   Sit to Stand 10 reps  with VC's to keep eyes up for safety   Knee/Hip Exercises: Aerobic   Stationary Bike nustep  level 5 X 6 min   Knee/Hip Exercises: Supine   Bridges Strengthening;2 sets;10 reps                  PT Short Term Goals - 03/30/15 1233    PT SHORT TERM GOAL #1   Title Pt will be independent with initial HEP for strengthening and balance (03/16/15)   Baseline Appears he does with assist at home   Status Partially Met   PT [place] #2   Title Pt will improve Berg to 30/56 to decrease fall risk. (03/16/15)   Baseline 24/56 today    Status On-going   PT SHORT TERM GOAL #3   Title Pt will report </= 4/10 pain with walking of 600 feet to improve mobility within household. 96/22/16)   Baseline No pain but fatigue limited   Status Partially Met           PT Long Term Goals - 03/30/15 1233    PT LONG TERM GOAL #1   Title Pt/caregiver will be independent with advanced HEP. (04/13/15)   Status On-going   PT LONG TERM GOAL #2   Title Pt will improve Berg to >/= 36/56 to decrease fall risk. (04/13/15)   Status On-going   PT LONG TERM GOAL #3   Title Pt will report pain </= 2/10 with walking up to 600' for improved mobility in household. (04/13/15)   Baseline Pain does not limit , fatigue does   Status On-going   PT LONG TERM GOAL #4   Title Pt will improve gait velocity to >/= 2.0 ft/sec for decreased fall risk. (04/13/15)   Status Unable to assess   PT LONG TERM GOAL #5   Title Pt will improve hip strength to >/= 4/5 for improved ability with household tasks. (04/13/15)   Status On-going               Plan - 04/11/15 1415    Clinical Impression Statement He did very well today with gait trining in RW with negotiating obstacles , upright posture, staying in RW for safety   Pt will benefit from skilled therapeutic intervention in order to improve on the following deficits Abnormal gait;Decreased activity tolerance;Decreased  balance;Decreased endurance;Decreased mobility;Decreased safety awareness;Dizziness;Difficulty walking;Decreased strength;Postural dysfunction;Pain   PT Frequency 1x / week   PT Duration 3 weeks   PT  Next Visit Plan Assess for renewal or discharge, [place]   PT Home Exercise Plan Needs to do HEP 2x/day   Consulted and Agree with Plan of Care Patient        Problem List Patient Active Problem List   Diagnosis Date Noted  . Elevated LFTs 03/31/2015  . Chronic systolic CHF (congestive heart failure) 01/18/2015  . History of right hip hemiarthroplasty   . Closed right hip fracture   . Fall   . Paroxysmal atrial tachycardia   . Paroxysmal atrial flutter   . CKD (chronic kidney disease), stage III   . Encounter for therapeutic drug monitoring 11/03/2013  . Eyelid cyst 07/09/2012  . Insomnia 11/18/2011  . BPH with urinary obstruction 05/14/2011  . HYPERTENSION, BENIGN 08/04/2009  . MITRAL INSUFFICIENCY 04/04/2009  . SYSTOLIC HEART FAILURE, CHRONIC 04/04/2009  . Paroxysmal atrial fibrillation 02/22/2009  . Unsteady gait 10/13/2008  . INSOMNIA, PERSISTENT 02/11/2008  . Hypothyroidism 07/21/2007  . ANXIETY 07/21/2007  . Coronary atherosclerosis 07/21/2007  . PERIPHERAL VASCULAR DISEASE 07/21/2007  . SKIN CANCER, HX OF 07/21/2007  . COLONIC POLYPS, HX OF 07/21/2007     Natividad Brood, PTA  04/11/2015, 2:19 PM  [facility] [address] [place], [place], [address] Phone: [phone]   Fax:  [phone]

## 2015-04-12 ENCOUNTER — Ambulatory Visit: Payer: Medicare Other | Admitting: Physical Therapy

## 2015-04-12 DIAGNOSIS — R29898 Other symptoms and signs involving the musculoskeletal system: Secondary | ICD-10-CM

## 2015-04-12 DIAGNOSIS — R531 Weakness: Secondary | ICD-10-CM

## 2015-04-12 DIAGNOSIS — R269 Unspecified abnormalities of gait and mobility: Secondary | ICD-10-CM | POA: Diagnosis not present

## 2015-04-12 DIAGNOSIS — R2689 Other abnormalities of gait and mobility: Secondary | ICD-10-CM

## 2015-04-12 DIAGNOSIS — R293 Abnormal posture: Secondary | ICD-10-CM

## 2015-04-12 NOTE — Therapy (Addendum)
[place], [place], [address] Phone: [phone]   Fax:  [phone]  Physical Therapy Treatment  Patient Details  Name: [name] MRN: [id number] Date of Birth: 05-19-1926 Referring Provider:  Cassandria Anger, MD  Encounter Date: 04/12/2015      PT End of Session - 04/12/15 1211    Visit Number 14   Number of Visits 16   Date for PT Re-Evaluation 04/13/15   PT Start Time 5701   PT Stop Time 1104   PT Time Calculation (min) 49 min   Equipment Utilized During Treatment Gait belt   Activity Tolerance Patient tolerated treatment well   Behavior During Therapy [facility] for tasks assessed/performed      Past Medical History  Diagnosis Date  . CAD (coronary artery disease)     a. LHC 4/06 with 60% pLAD, 40% ramus, 60% mRCA, and 90% mPDA. No intervention. Inferior akinesis on LV-gram was suggestive of possible inferior MI with recanalization.  . Hyperlipidemia     a. Myalgias with Lipitor and Crestor.  . Hypothyroidism   . Peripheral vascular disease   . Anxiety   . Hx of colonic polyps   . Paroxysmal atrial fibrillation 02/2009     a. With RVR tx with amiodarone returning to NSR 02/2009. b. Had atypical aflutter vs atrial tach 4/14, converted to NSR 4/14. In atypical flutter vs atrial tach in 1/15, DCCV 1/15 to NSR. c. Atrial tach 7/15 with junctional escape 60s.  . Systolic CHF     a.  Probable ischemic cardiomyopathy. Echo (6/10) with EF 40%, mildly dilated LV, moderate MR. ;  b.  echo 3/12: mild LVH, EF 30-35%, IL HK and Lat HK, mod to severe MR, trivial AI, mod LAE  . PNA (pneumonia)     History of   . Mitral regurgitation     a. Moderate by echo 6/10. b. 2-3+ by cath 2006; mod to severe by echo 3/12  . PVC's (premature ventricular contractions)   . Cataract   . Heart murmur   . CKD (chronic kidney disease), stage III   . Skin cancer     Hx of 2008 BCC  . Chronic indwelling Foley catheter      per family, bladder muscles no longer work  . Depression   . Myocardial infarction   . Basal cell carcinoma   . Paroxysmal atrial tachycardia     a. See details under PAF.  Marland Kitchen Paroxysmal atrial flutter     a. See details under PAF.  Marland Kitchen BPH (benign prostatic hyperplasia)   . Recurrent UTI   . Imbalance   . Carotid stenosis     a. Dopplers 2/15 - 40-59% BICA.    Past Surgical History  Procedure Laterality Date  . Skin cancer excision  2008    shoulder and nose   . Cardioversion N/A 01/02/2013    Procedure: CARDIOVERSION;  Surgeon: Larey Dresser, MD;  Location: [place];  Service: Cardiovascular;  Laterality: N/A;  . Cardioversion N/A 10/23/2013    Procedure: CARDIOVERSION;  Surgeon: Larey Dresser, MD;  Location: [facility] ENDOSCOPY;  Service: Cardiovascular;  Laterality: N/A;  . Hip arthroplasty Right 08/21/2014    Procedure: ARTHROPLASTY BIPOLAR HIP;  Surgeon: Mcarthur Rossetti, MD;  Location: [place];  Service: Orthopedics;  Laterality: Right;    There were no vitals filed for this visit.  Visit Diagnosis:  Abnormality of gait  Difficulty balancing when standing  Weakness generalized  Abnormal  posture  Weakness of right lower extremity      Subjective Assessment - 04/12/15 1210    Subjective feeling okay today; knows it is his last visit and wants to know how much better he is    Currently in Pain? No/denies   Multiple Pain Sites No            [facility] PT Assessment - 04/12/15 1038    Observation/Other Assessments   Lower Extremity Functional Scale  11/80 (improved from 6/80)   Strength   Right Hip Flexion 4/5   Right Hip ABduction 3/5   Right Hip ADduction 5/5   Left Hip Flexion 4/5   Left Hip ABduction 4-/5   Left Hip ADduction 5/5   Right Knee Flexion 5/5   Right Knee Extension 4+/5   Left Knee Flexion 5/5   Left Knee Extension 4/5   Right Ankle Dorsiflexion 4+/5   Left Ankle Dorsiflexion 4/5   Berg Balance Test   Sit to Stand Able to stand  without using hands and stabilize independently   Standing Unsupported Able to stand 2 minutes with supervision   Sitting with Back Unsupported but Feet Supported on Floor or Stool Able to sit safely and securely 2 minutes   Stand to Sit Controls descent by using hands   Transfers Able to transfer safely, definite need of hands   Standing Unsupported with Eyes Closed Able to stand 10 seconds with supervision   Standing Ubsupported with Feet Together Able to place feet together independently but unable to hold for 30 seconds   From Standing, Reach Forward with Outstretched Arm Can reach forward >5 cm safely (2")   From Standing Position, Pick up Object from Floor Able to pick up shoe, needs supervision   From Standing Position, Turn to Look Behind Over each Shoulder Looks behind from both sides and weight shifts well   Turn 360 Degrees Needs close supervision or verbal cueing   Standing Unsupported, Alternately Place Feet on Step/Stool Able to complete >2 steps/needs minimal assist   Standing Unsupported, One Foot in Front Able to plae foot ahead of the other independently and hold 30 seconds   Standing on One Leg Tries to lift leg/unable to hold 3 seconds but remains standing independently   Total Score 37                     [facility] Adult PT Treatment/Exercise - 04/12/15 1208    Ambulation/Gait   Ambulation Distance (Feet) 600 Feet   Assistive device Rolling walker   Ambulation Surface Level;Indoor   Gait velocity 2.0 ft/sec  standing 10 sec rest break x3 in last 229f                PT Education - 04/12/15 1104    Education provided Yes   Education Details sidelying hip abduction   Person(s) Educated Patient   Methods Explanation;Handout   Comprehension Verbalized understanding          PT Short Term Goals - 04/12/15 1219    PT SHORT TERM GOAL #1   Title Pt will be independent with initial HEP for strengthening and balance (03/16/15)   Baseline Appears he  does with assist at home   Time 4   Period Weeks   Status Achieved   PT SHORT TERM GOAL #2   Title Pt will improve Berg to 30/56 to decrease fall risk. (03/16/15)   Baseline 37/56 today    Time 4   Period  Weeks   Status Achieved   PT SHORT TERM GOAL #3   Title Pt will report </= 4/10 pain with walking of 600 feet to improve mobility within household. 96/22/16)   Time 4   Period Weeks   Status Achieved           PT Long Term Goals - 04/12/15 1220    PT LONG TERM GOAL #1   Title Pt/caregiver will be independent with advanced HEP. (04/13/15)   Time 8   Period Weeks   Status Achieved   PT LONG TERM GOAL #2   Title Pt will improve Berg to >/= 36/56 to decrease fall risk. (04/13/15)   Time 8   Period Weeks   Status Achieved   PT LONG TERM GOAL #3   Title Pt will report pain </= 2/10 with walking up to 600' for improved mobility in household. (04/13/15)   Time 8   Period Weeks   Status Achieved   PT LONG TERM GOAL #4   Title Pt will improve gait velocity to >/= 2.0 ft/sec for decreased fall risk. (04/13/15)   Period Weeks   Status Achieved   PT LONG TERM GOAL #5   Title Pt will improve hip strength to >/= 4/5 for improved ability with household tasks. (04/13/15)  with exception of R hip aBduction    Time 8   Period Weeks   Status Achieved     G-code: mobility goal CL  Discharge CL  Clinical judgement          Plan - 04/12/15 1214    Clinical Impression Statement Patient is ready for DC; has met STG #2 and LTG #2 with BERG score of 37/56 decreasing fall risk to 80%; pt. able to ambulate 600' with no pain meeting STG #3 and LTG #3; LTG #4 met with gait velocity improved to 2.17f/sec; pt. met LTG #5 with exception of R hip aBduction at 3/5, all other hip, knee and ankle strength measured >/= 4/5 Bilaterally    PT Next Visit Plan DC today    Consulted and Agree with Plan of Care Patient     During this treatment session, the therapist was present, participating in and  directing the treatment.   Problem List Patient Active Problem List   Diagnosis Date Noted  . Elevated LFTs 03/31/2015  . Chronic systolic CHF (congestive heart failure) 01/18/2015  . History of right hip hemiarthroplasty   . Closed right hip fracture   . Fall   . Paroxysmal atrial tachycardia   . Paroxysmal atrial flutter   . CKD (chronic kidney disease), stage III   . Encounter for therapeutic drug monitoring 11/03/2013  . Eyelid cyst 07/09/2012  . Insomnia 11/18/2011  . BPH with urinary obstruction 05/14/2011  . HYPERTENSION, BENIGN 08/04/2009  . MITRAL INSUFFICIENCY 04/04/2009  . SYSTOLIC HEART FAILURE, CHRONIC 04/04/2009  . Paroxysmal atrial fibrillation 02/22/2009  . Unsteady gait 10/13/2008  . INSOMNIA, PERSISTENT 02/11/2008  . Hypothyroidism 07/21/2007  . ANXIETY 07/21/2007  . Coronary atherosclerosis 07/21/2007  . PERIPHERAL VASCULAR DISEASE 07/21/2007  . SKIN CANCER, HX OF 07/21/2007  . COLONIC POLYPS, HX OF 07/21/2007    MDenna Haggard SPTA  04/12/2015 12:22 PM  Phone: [phone] Fax: [phone] JHessie Diener PTA 04/12/2015 1:10 PM Phone: [phone]: 3[facility] [address]Phone: [phone]  Fax:  [phone]    PHYSICAL THERAPY DISCHARGE SUMMARY  Visits from Start of  Care: 14  Current functional level related to goals / functional outcomes: See above   Remaining deficits: He continues to have LE weakness and decreased balance. He has improved with balance with improved BERG score ut is not safe without walker and I expect he will need this long term . He needs to do his HEP on regular basis for max benefit.     Education / Equipment: HEP  Plan: Patient agrees to discharge.  Patient goals were met. Patient is being discharged due to                                                     ?????  MAX benefit from PT at this  time. Darrel Hoover, PT       04/25/15     2:53 PM

## 2015-04-12 NOTE — Patient Instructions (Signed)
Abduction: Side Leg Lift (Eccentric) - Side-Lying    Lie on side. Lift top leg slightly higher than shoulder level. Keep top leg straight with body, toes pointing forward. Slowly lower for 3-5 seconds. _10__ reps per set, _2__ sets per day, _7__ days per week.     

## 2015-04-21 ENCOUNTER — Ambulatory Visit (INDEPENDENT_AMBULATORY_CARE_PROVIDER_SITE_OTHER): Payer: Medicare Other | Admitting: *Deleted

## 2015-04-21 DIAGNOSIS — I480 Paroxysmal atrial fibrillation: Secondary | ICD-10-CM | POA: Diagnosis not present

## 2015-04-21 DIAGNOSIS — I4891 Unspecified atrial fibrillation: Secondary | ICD-10-CM

## 2015-04-21 DIAGNOSIS — Z5181 Encounter for therapeutic drug level monitoring: Secondary | ICD-10-CM | POA: Diagnosis not present

## 2015-04-21 LAB — POCT INR: INR: 2.8

## 2015-05-04 ENCOUNTER — Ambulatory Visit (HOSPITAL_COMMUNITY)
Admission: RE | Admit: 2015-05-04 | Discharge: 2015-05-04 | Disposition: A | Discharge status: Home / Self Care | Payer: Medicare Other | Source: Ambulatory Visit | Attending: Internal Medicine | Admitting: Internal Medicine

## 2015-05-04 ENCOUNTER — Ambulatory Visit (INDEPENDENT_AMBULATORY_CARE_PROVIDER_SITE_OTHER): Payer: Medicare Other | Admitting: Pharmacist

## 2015-05-04 DIAGNOSIS — I5022 Chronic systolic (congestive) heart failure: Secondary | ICD-10-CM | POA: Diagnosis not present

## 2015-05-04 DIAGNOSIS — Z5181 Encounter for therapeutic drug level monitoring: Secondary | ICD-10-CM | POA: Diagnosis not present

## 2015-05-04 DIAGNOSIS — I4891 Unspecified atrial fibrillation: Secondary | ICD-10-CM

## 2015-05-04 DIAGNOSIS — I480 Paroxysmal atrial fibrillation: Secondary | ICD-10-CM

## 2015-05-04 DIAGNOSIS — R945 Abnormal results of liver function studies: Secondary | ICD-10-CM

## 2015-05-04 DIAGNOSIS — R7989 Other specified abnormal findings of blood chemistry: Secondary | ICD-10-CM | POA: Insufficient documentation

## 2015-05-04 LAB — HEPATIC FUNCTION PANEL
ALT: 54 U/L (ref 17–63)
AST: 111 U/L — ABNORMAL HIGH (ref 15–41)
Albumin: 2.3 g/dL — ABNORMAL LOW (ref 3.5–5.0)
Alkaline Phosphatase: 137 U/L — ABNORMAL HIGH (ref 38–126)
Bilirubin, Direct: 0.4 mg/dL (ref 0.1–0.5)
Indirect Bilirubin: 0.8 mg/dL (ref 0.3–0.9)
Total Bilirubin: 1.2 mg/dL (ref 0.3–1.2)
Total Protein: 6.4 g/dL — ABNORMAL LOW (ref 6.5–8.1)

## 2015-05-04 LAB — POCT INR: INR: 3.8

## 2015-05-11 ENCOUNTER — Ambulatory Visit (INDEPENDENT_AMBULATORY_CARE_PROVIDER_SITE_OTHER): Payer: Medicare Other | Admitting: *Deleted

## 2015-05-11 DIAGNOSIS — I4891 Unspecified atrial fibrillation: Secondary | ICD-10-CM

## 2015-05-11 DIAGNOSIS — I480 Paroxysmal atrial fibrillation: Secondary | ICD-10-CM | POA: Diagnosis not present

## 2015-05-11 DIAGNOSIS — Z5181 Encounter for therapeutic drug level monitoring: Secondary | ICD-10-CM | POA: Diagnosis not present

## 2015-05-11 LAB — POCT INR: INR: 2.9

## 2015-05-18 ENCOUNTER — Ambulatory Visit (INDEPENDENT_AMBULATORY_CARE_PROVIDER_SITE_OTHER): Payer: Medicare Other | Admitting: *Deleted

## 2015-05-18 ENCOUNTER — Other Ambulatory Visit: Payer: Self-pay | Admitting: Cardiology

## 2015-05-18 DIAGNOSIS — I480 Paroxysmal atrial fibrillation: Secondary | ICD-10-CM

## 2015-05-18 DIAGNOSIS — Z5181 Encounter for therapeutic drug level monitoring: Secondary | ICD-10-CM | POA: Diagnosis not present

## 2015-05-18 DIAGNOSIS — I4891 Unspecified atrial fibrillation: Secondary | ICD-10-CM

## 2015-05-18 LAB — POCT INR: INR: 3.6

## 2015-05-20 ENCOUNTER — Emergency Department (INDEPENDENT_AMBULATORY_CARE_PROVIDER_SITE_OTHER)
Admission: EM | Admit: 2015-05-20 | Discharge: 2015-05-20 | Disposition: A | Discharge status: Home / Self Care | Payer: Medicare Other | Source: Home / Self Care | Attending: Family Medicine | Admitting: Family Medicine

## 2015-05-20 ENCOUNTER — Encounter (HOSPITAL_COMMUNITY): Payer: Self-pay

## 2015-05-20 DIAGNOSIS — R58 Hemorrhage, not elsewhere classified: Secondary | ICD-10-CM

## 2015-05-20 DIAGNOSIS — S41112A Laceration without foreign body of left upper arm, initial encounter: Secondary | ICD-10-CM | POA: Diagnosis not present

## 2015-05-20 NOTE — Discharge Instructions (Signed)
???? ?? ???????? ???????, ???????? (Laceration Care, Adult) Skin tear Applied Surgicel and sterile dressing  ???????? ????? ??? ?????? ???? - ??????????? ??????????? ??????? ???????, ????????????? ????????? ?????.  ???????  ????????? ???????? ?????? ?? ????????? ????????. ????????? ???????? ?????? ?????? ???????? ??-?? ????? ????????. ????? ??? ????? ?????? ????? ????????? ?????? ?????????? ? ?????, ????? ??????? ???? ???????? ???????? ? ????????? ????? ?? ???????? ????.  ???? ???????? ???? ?????????????, ???????? ????? ???? ?????????????? ????????? ? ?????? ?????????????. ???? ????? ?????????? ? ???????, ????? ?? ????????? ????????????? ????????????? ?????????. ??? ??????? ???? ??????? ??? (????????????? ???), ?????, ?????????? ????????????? ???? (???????) ??? ????????????? ???????? ??? ???????? ????. ?? ????? ???? ????????? ???? ???? ?????????? ? ????????????, ??? ???????? ???? ??????? ?????? ? ??????? ??????? ?? ??????? ????. ?????? ??? ?????? ????????? ?????. ????? ????, ??? ????? ???????, ?????????? ????? ????? ?????????, ???? ???????? ?? ???? ?????????????? ???? ?? ?????????? 1 ????.  ?????????? ?? ????? ? ???????? ????????  ???? ?? ??????????? ????? ??? ???????:   ???? ?????????? ????????? ? ??????? ? ???????.  ???? ??? ???????? ????? (???????), ?? ?????????? ?????? ?? ????? ?????? ???? ? ????. ?????, ???? ??????? ??????????? ??? ?????????, ????????? ??, ??? ???????? ??????????? ?????.  ??? ???? ? ???? ????? ??????? ???? ?????? ???? ? ?????, ????????? ?????????? ?????. ???????? ???? ?????, ????? ????? ??? ????. ????????? ?????????? ?????? ??????????.  ????? ?????? ???????? ?? ???? ?????? ???? ???? ? ???????????? ? ???????????? ? ???????????? ?????. ??? ???????? ????????????? ???????? ???????? ? ?? ???? ?????? ?????????? ? ????.  ?? ?????? ????????? ??? ? ??????? ??????? ????? 24 ???? ????? ?????? ? ?????. ?????? ?????? ???? ? ????, ???? ?? ???? ????? ???.  ?????????? ?????????? ??????  ?????????????? ??? ??????????? ????????? ?? ????, ???????? ??????????? ??? ?????????? ???????????.  ??????? ??? ??? ????? ? ???????????? ? ???????????? ?????. ???? ?? ?????????? ????????????? ?????????:   ???? ?????????? ????????? ? ??????? ? ???????.  ?????? ?????? ????????. ?????? ????????? ? ?? ???????? ????, ????? ??????????.  ???? ???? ???????, ????????? ?????? ?????????? ?? ??????????.  ????????????? ???????? ??? ?????????, ????? ???????? ????. ?? ?????? ????????? ???????? ?? ???? ????????? ????. ?????? ?????????? ????????, ??????? ?????? ?????????? ?? ????. ??? ???? ?????????. ???? ?? ?????, ???????? ????????? ??? ????:   ????? ????????? ?????? ???? ? ???? ??? ??????. ?????? ??????? ????????? ???? ? ???? ??? ?????? ????. ?????? ???????. ????????? ????????, ????? ?? ?????? ???????, ???? ??????? ?? ????????? ??????????????. ????? ???????? ???? ??? ????? ????? ?????????? ???? ?????? ?????? ??????????.  ?????? ???????? ?????-???? ????????? ? ?????? ?????, ???? ??? ????? ?? ????, ???? ??????? ?? ?????????. ??? ????? ???????? ??????????? ?????? ?? ?????????? ????.  ???? ?? ???? ???????? ???????, ?? ???????????? ????????, ???????????? ???????, ??????????????? ?? ????. ??? ????? ???????? ? ???????????????? ???????? ???????? ? ??????????? ???? ?? ?? ??????? ??????????.  ????????? ??????????? ?????????? ?? ?????? ??? ? ???????, ???? ?? ???? ????????? ???????. ??????????? ????????????????? ????????? ?? ???? ? ?????? ??? ????? ?????? ??????? ???? ????? ??????.  ??????? ?????? ???????? 5-10 ????, ? ????? ??? ????????????. ?????? ???????? ??????, ?????????????? ?? ???????? ????????? ????. ??? ????? ????????????? ???????? ?? ?????????, ????:  ?? ?? ?????? ?????????, ????? ??? ?????? ???????? ?? ????????? ? ????????? ???.  ??? ??????? ?? ?????? ???????? ?? ?????????. ???? ??? ??????? ???????? ?? ?????????, ???? ???? ????? ?????, ?????????? ? ????? ?????? ?? ?????. ??? - ??????? ??????? ? ??  ???????????? ?????? ???????? ???? ??? ?????????? ??????? ???????? ?? ?????????, ? ?? ?????? ?? ?? ??????, ?????????? ????????? ??????????? ???????? ?????????. ??????????? ????????? ????? ???? ????? ??????????.  ?????????? ? ?????, ????:   ???? ??????????, ????????, ??? ? ???? ??????????? ????.  ?? ???? ????????? ??????? ??????, ???????????? ? ?????? ????.  ?? ???? ?????????? ??????-?????? ???????? ((????).  ? ??? ?????????? ???????????.  ?? ???? ????????? ??????.  ???? ???? ????????? ?? ??? ????? ????? ?????? ????.  ?? ????????, ??? ?? ???? ????? ????????? ???????, ????????, ??????? ?????? ??? ??????? ??????.  ???? ????????? ?? ?????? ??? ?????, ? ?? ?? ?????? ?????????? ???????? ???? ??? ????. ?????????? ????????? ?????? ??????, ????:   ???? ?????????? ?????????? ??????? ????????.  ? ?????? ???? ???????? ??????? ????, ??????? ???????? ???? ? ???????? ??? ????????? ? ????? ???? ?? ????, ??????, ???? ??? ?????.  ???? ???? ?????????, ? ???? ????? ???????????.  ? ??? ??????????? ????????, ????????, ??? ???????? ??????? ??????? ????? ????.  ?? ???? ????? ???? ??? ?????? ? ????? ????? ???????????? ??????????? ???????????. ?????????, ??? ??:   ????????? ????????? ??????????.  ?????? ?????????????? ??????? ?? ??????????.  ??????????????? ?????????? ? ?????, ???? ??? ?? ?????????? ????? ??? ?????????? ????.  Document Released: 09/10/2005 Document Revised: 12/03/2011 [facility] Patient Information 2015 [place]. This information is not intended to replace advice given to you by your health care provider. Make sure you discuss any questions you have with your health care provider.

## 2015-05-20 NOTE — ED Provider Notes (Signed)
CSN: [id number]     Arrival date & time 05/20/15  1305 History   First MD Initiated Contact with Patient 05/20/15 1405     Chief Complaint  Patient presents with  . Extremity Laceration   (Consider location/radiation/quality/duration/timing/severity/associated sxs/prior Treatment) HPI Comments: 81 year old male presents to the urgent care with a complaint of bleeding from a small skin tear of the right forearm. This occurred approximately one week ago. The wound originally formed a small hematoma and over a couple days after bumping it began the overlying skin ruptured and caused a small area of bleeding from the skin tear. This bleeding is persisted over the past 4 days. The patient is on Coumadin for cardiomyopathies and dysrhythmias.    The history is provided by a relative. The history is limited by a language barrier. A language interpreter was used.    Past Medical History  Diagnosis Date  . CAD (coronary artery disease)     a. LHC 4/06 with 60% pLAD, 40% ramus, 60% mRCA, and 90% mPDA. No intervention. Inferior akinesis on LV-gram was suggestive of possible inferior MI with recanalization.  . Hyperlipidemia     a. Myalgias with Lipitor and Crestor.  . Hypothyroidism   . Peripheral vascular disease   . Anxiety   . Hx of colonic polyps   . Paroxysmal atrial fibrillation 02/2009     a. With RVR tx with amiodarone returning to NSR 02/2009. b. Had atypical aflutter vs atrial tach 4/14, converted to NSR 4/14. In atypical flutter vs atrial tach in 1/15, DCCV 1/15 to NSR. c. Atrial tach 7/15 with junctional escape 60s.  . Systolic CHF     a.  Probable ischemic cardiomyopathy. Echo (6/10) with EF 40%, mildly dilated LV, moderate MR. ;  b.  echo 3/12: mild LVH, EF 30-35%, IL HK and Lat HK, mod to severe MR, trivial AI, mod LAE  . PNA (pneumonia)     History of   . Mitral regurgitation     a. Moderate by echo 6/10. b. 2-3+ by cath 2006; mod to severe by echo 3/12  . PVC's (premature  ventricular contractions)   . Cataract   . Heart murmur   . CKD (chronic kidney disease), stage III   . Skin cancer     Hx of 2008 BCC  . Chronic indwelling Foley catheter     per family, bladder muscles no longer work  . Depression   . Myocardial infarction   . Basal cell carcinoma   . Paroxysmal atrial tachycardia     a. See details under PAF.  Marland Kitchen Paroxysmal atrial flutter     a. See details under PAF.  Marland Kitchen BPH (benign prostatic hyperplasia)   . Recurrent UTI   . Imbalance   . Carotid stenosis     a. Dopplers 2/15 - 40-59% BICA.   Past Surgical History  Procedure Laterality Date  . Skin cancer excision  2008    shoulder and nose   . Cardioversion N/A 01/02/2013    Procedure: CARDIOVERSION;  Surgeon: Larey Dresser, MD;  Location: [place];  Service: Cardiovascular;  Laterality: N/A;  . Cardioversion N/A 10/23/2013    Procedure: CARDIOVERSION;  Surgeon: Larey Dresser, MD;  [facility];  Service: Cardiovascular;  Laterality: N/A;  . Hip arthroplasty Right 08/21/2014    Procedure: ARTHROPLASTY BIPOLAR HIP;  Surgeon: Mcarthur Rossetti, MD;  Location: [place];  Service: Orthopedics;  Laterality: Right;   Family History  Problem Relation Age of Onset  .  Coronary artery disease Other     male, 1st degree relative <60   . Heart disease Mother   . Heart attack Mother    Social History  Substance Use Topics  . Smoking status: Never Smoker   . Smokeless tobacco: None  . Alcohol Use: No    Review of Systems  Constitutional: Negative.   HENT: Negative.   Skin: Positive for wound.  Neurological: Negative.   Hematological: Bruises/bleeds easily.    Allergies  Ciprofloxacin; Ezetimibe-simvastatin; Rosuvastatin; and Atenolol  Home Medications   Prior to Admission medications   Medication Sig Start Date End Date Taking? Authorizing Provider  amiodarone (PACERONE) 100 MG tablet Take 200 mg by mouth daily.     Historical Provider, MD  cephALEXin (KEFLEX)  500 MG capsule Take 1 capsule (500 mg total) by mouth 3 (three) times daily. 04/02/15   Shawnee Knapp, MD  Control Gel Formula Dressing (DUODERM CGF DRESSING) MISC Apply 2 each topically 2 (two) times a week. 09/30/14   [name], MD  digoxin (LANOXIN) 0.125 MG tablet Take 0.5 tablets (0.0625 mg total) by mouth every other day. Patient taking differently: Take 0.0625 mg by mouth every other day. Takes at noon 07/05/14   Larey Dresser, MD  digoxin (LANOXIN) 0.125 MG tablet 1/2 TABLET EVERY OTHER DAY 05/19/15   Larey Dresser, MD  furosemide (LASIX) 20 MG tablet Take 1 tablet (20 mg total) by mouth daily. Patient taking differently: Take 20 mg by mouth every evening.  10/14/14   [name], MD  furosemide (LASIX) 40 MG tablet Take one tab( 40 mg) in the AM 10/14/14   [name], MD  levothyroxine (SYNTHROID, LEVOTHROID) 150 MCG tablet Take 1 tablet (150 mcg total) by mouth daily before breakfast. 04/07/15   Larey Dresser, MD  lisinopril (PRINIVIL,ZESTRIL) 2.5 MG tablet TAKE 1 TABLET (2.5 MG TOTAL) BY MOUTH DAILY. 04/12/15   Jolaine Artist, MD  LORazepam (ATIVAN) 0.5 MG tablet Take 0.5 mg by mouth 2 (two) times daily.    Historical Provider, MD  metoprolol succinate (TOPROL XL) 25 MG 24 hr tablet Take 0.5 tablets (12.5 mg total) by mouth daily. 10/13/14   Larey Dresser, MD  MILK THISTLE PO Take 325 mg by mouth 3 (three) times daily.    Historical Provider, MD  Multiple Vitamin (MULTIVITAMIN WITH MINERALS) TABS tablet Take 1 tablet by mouth daily.    Historical Provider, MD  Omega-3 Fatty Acids (FISH OIL) 1200 MG CAPS Take 1 capsule (1,200 mg total) by mouth daily. 08/27/13   [name], MD  potassium chloride (K-DUR,KLOR-CON) 10 MEQ tablet Take 2 tablets (20 mEq total) by mouth daily. 10/29/14   Jolaine Artist, MD  Sennosides-Docusate Sodium (SENNA) 8.6-50 MG TABS Take 2 tablets by mouth daily.    Historical Provider, MD  tamsulosin (FLOMAX) 0.4 MG CAPS capsule Take 0.4 mg by mouth  at bedtime.  12/24/13   [name], MD  warfarin (COUMADIN) 1 MG tablet Take 1 tablet (1 mg total) by mouth daily. 02/07/15   Larey Dresser, MD  zolpidem (AMBIEN) 5 MG tablet Take 1 tablet (5 mg total) by mouth at bedtime as needed for sleep. 10/14/14   [name], MD   Meds Ordered and Administered this Visit  Medications - No data to display  BP 98/55 mmHg  Pulse 52  Temp(Src) 97.9 F (36.6 C) (Oral)  Resp 16  SpO2 96% No data found.   Physical Exam  Constitutional: He appears  well-developed and well-nourished. No distress.  Pulmonary/Chest: Effort normal. No respiratory distress.  Neurological: He is alert. He exhibits normal muscle tone.  Skin: Skin is warm and dry.  There is an approximately 2 cm area of a superficial skin tear to the right forearm. There is minimal but persistent bleeding from this area. No signs of infection. No swelling or erythema. No exudates or abnormal drainage. No lymphangitis.  Nursing note and vitals reviewed.   ED Course  LACERATION REPAIR Date/Time: 05/20/2015 2:36 PM Performed by: Marcha Dutton, Juliano Mceachin Authorized by: Ihor Gully D Consent: Verbal consent obtained. Risks and benefits: risks, benefits and alternatives were discussed Consent given by: patient Patient understanding: patient states understanding of the procedure being performed Patient identity confirmed: verbally with patient Body area: upper extremity Location details: right upper arm Laceration length: 2 cm Foreign bodies: no foreign bodies Tendon involvement: none Nerve involvement: none Vascular damage: no Patient sedated: no Irrigation solution: saline Irrigation method: tap Amount of cleaning: standard Debridement: none Degree of undermining: none Patient tolerance: Patient tolerated the procedure well with no immediate complications Comments: Gelfoam and dressing applied.    (including critical care time)  Labs Review Labs Reviewed - No data to  display  Imaging Review No results found.   Visual Acuity Review  Right Eye Distance:   Left Eye Distance:   Bilateral Distance:    Right Eye Near:   Left Eye Near:    Bilateral Near:         MDM   1. Skin tear of upper arm without complication, left, initial encounter   2. Bleeding    Skin tear Applied Surgicel and sterile dressing Leave on for 1 day Watch for infection.    Janne Napoleon, NP 05/20/15 1438

## 2015-05-20 NOTE — ED Notes (Signed)
Does not speak english, son acting as Biochemist, clinical. Per family member who is acting as Optometrist, he bumped right arm 5 days ago, developed a bruise, when subsequently tore. Has reportedly been bleeding x past 4-5 days

## 2015-05-25 ENCOUNTER — Ambulatory Visit (INDEPENDENT_AMBULATORY_CARE_PROVIDER_SITE_OTHER): Payer: Medicare Other | Admitting: *Deleted

## 2015-05-25 DIAGNOSIS — Z5181 Encounter for therapeutic drug level monitoring: Secondary | ICD-10-CM | POA: Diagnosis not present

## 2015-05-25 DIAGNOSIS — I4891 Unspecified atrial fibrillation: Secondary | ICD-10-CM

## 2015-05-25 DIAGNOSIS — I480 Paroxysmal atrial fibrillation: Secondary | ICD-10-CM

## 2015-05-25 LAB — POCT INR: INR: 3.3

## 2015-05-27 ENCOUNTER — Other Ambulatory Visit (HOSPITAL_COMMUNITY): Payer: Self-pay | Admitting: Cardiology

## 2015-05-27 MED ORDER — POTASSIUM CHLORIDE CRYS ER 10 MEQ PO TBCR: 20.0000 meq | EXTENDED_RELEASE_TABLET | Freq: Every day | ORAL | Status: DC

## 2015-05-27 MED ORDER — LEVOTHYROXINE SODIUM 150 MCG PO TABS: 150.0000 ug | ORAL_TABLET | Freq: Every day | ORAL | Status: AC

## 2015-06-02 ENCOUNTER — Ambulatory Visit (INDEPENDENT_AMBULATORY_CARE_PROVIDER_SITE_OTHER): Payer: Medicare Other | Admitting: Pharmacist

## 2015-06-02 DIAGNOSIS — I480 Paroxysmal atrial fibrillation: Secondary | ICD-10-CM

## 2015-06-02 DIAGNOSIS — I4891 Unspecified atrial fibrillation: Secondary | ICD-10-CM | POA: Diagnosis not present

## 2015-06-02 DIAGNOSIS — Z5181 Encounter for therapeutic drug level monitoring: Secondary | ICD-10-CM

## 2015-06-02 LAB — POCT INR: INR: 3.7

## 2015-06-03 ENCOUNTER — Telehealth (HOSPITAL_COMMUNITY): Payer: Self-pay | Admitting: Cardiology

## 2015-06-03 NOTE — Telephone Encounter (Signed)
pts daughter called to request a work in appt before the first available already scheduled on 06/21/15 Daughter reports patient fell x 5 days ago Since fall patient is having a difficult time breathing, "my dad is more critical now", increase in fatigue after walking short distances and only wants to sleep all day DENIES: change in weight, edema, chest pain, cough, increased confusion  Advised pt should probably be evaluated by PCP/urgent care to ensure he did not harm himself when he fell  Daughter states she would much rather follow up with Dr.McLean  Double book appt given for  06/06/15@ 220

## 2015-06-06 ENCOUNTER — Encounter (HOSPITAL_COMMUNITY): Payer: Self-pay

## 2015-06-06 ENCOUNTER — Ambulatory Visit (HOSPITAL_COMMUNITY)
Admission: RE | Admit: 2015-06-06 | Discharge: 2015-06-06 | Disposition: A | Discharge status: Home / Self Care | Payer: Medicare Other | Source: Ambulatory Visit | Attending: Cardiology | Admitting: Cardiology

## 2015-06-06 VITALS — BP 112/66 | HR 58 | Wt 136.5 lb

## 2015-06-06 DIAGNOSIS — I471 Supraventricular tachycardia: Secondary | ICD-10-CM | POA: Diagnosis not present

## 2015-06-06 DIAGNOSIS — I4892 Unspecified atrial flutter: Secondary | ICD-10-CM

## 2015-06-06 DIAGNOSIS — I340 Nonrheumatic mitral (valve) insufficiency: Secondary | ICD-10-CM | POA: Diagnosis not present

## 2015-06-06 DIAGNOSIS — I498 Other specified cardiac arrhythmias: Secondary | ICD-10-CM | POA: Diagnosis not present

## 2015-06-06 DIAGNOSIS — I5030 Unspecified diastolic (congestive) heart failure: Secondary | ICD-10-CM

## 2015-06-06 DIAGNOSIS — I6529 Occlusion and stenosis of unspecified carotid artery: Secondary | ICD-10-CM | POA: Diagnosis not present

## 2015-06-06 DIAGNOSIS — N189 Chronic kidney disease, unspecified: Secondary | ICD-10-CM | POA: Insufficient documentation

## 2015-06-06 DIAGNOSIS — E785 Hyperlipidemia, unspecified: Secondary | ICD-10-CM | POA: Diagnosis not present

## 2015-06-06 DIAGNOSIS — I5022 Chronic systolic (congestive) heart failure: Secondary | ICD-10-CM | POA: Insufficient documentation

## 2015-06-06 LAB — COMPREHENSIVE METABOLIC PANEL
ALBUMIN: 2.2 g/dL — AB (ref 3.5–5.0)
ALT: 68 U/L — ABNORMAL HIGH (ref 17–63)
AST: 135 U/L — ABNORMAL HIGH (ref 15–41)
Alkaline Phosphatase: 137 U/L — ABNORMAL HIGH (ref 38–126)
Anion gap: 11 (ref 5–15)
BUN: 21 mg/dL — ABNORMAL HIGH (ref 6–20)
CO2: 20 mmol/L — AB (ref 22–32)
Calcium: 8.1 mg/dL — ABNORMAL LOW (ref 8.9–10.3)
Chloride: 106 mmol/L (ref 101–111)
Creatinine, Ser: 1.73 mg/dL — ABNORMAL HIGH (ref 0.61–1.24)
GFR calc non Af Amer: 34 mL/min — ABNORMAL LOW (ref >60)
GFR, EST AFRICAN AMERICAN: 39 mL/min — AB (ref >60)
GLUCOSE: 143 mg/dL — AB (ref 65–99)
POTASSIUM: 4.3 mmol/L (ref 3.5–5.1)
SODIUM: 137 mmol/L (ref 135–145)
TOTAL PROTEIN: 6.8 g/dL (ref 6.5–8.1)
Total Bilirubin: 1.3 mg/dL — ABNORMAL HIGH (ref 0.3–1.2)

## 2015-06-06 LAB — BRAIN NATRIURETIC PEPTIDE: B Natriuretic Peptide: 287.4 pg/mL — ABNORMAL HIGH (ref 0.0–100.0)

## 2015-06-06 MED ORDER — POTASSIUM CHLORIDE CRYS ER 10 MEQ PO TBCR: 20.0000 meq | EXTENDED_RELEASE_TABLET | Freq: Two times a day (BID) | ORAL | Status: DC

## 2015-06-06 MED ORDER — FUROSEMIDE 40 MG PO TABS: 40.0000 mg | ORAL_TABLET | Freq: Two times a day (BID) | ORAL | Status: DC

## 2015-06-06 NOTE — Progress Notes (Signed)
Advanced Heart Failure Medication Review by a Pharmacist  Does the patient  feel that his/her medications are working for him/her?  yes  Has the patient been experiencing any side effects to the medications prescribed?  no  Does the patient measure his/her own blood pressure or blood glucose at home?  no   Does the patient have any problems obtaining medications due to transportation or finances?   no  Understanding of regimen: fair Understanding of indications: fair Potential of compliance: good    Pharmacist comments:  Mr. Hornstein is a pleasant 81 yo M presenting with his daughter and wife who manage his medications. There were no discrepancies in his medication list at this time and they did not have any medication-related questions or concerns for me at this time.   Ruta Hinds. Velva Harman, PharmD, BCPS, CPP Clinical Pharmacist Pager: [phone] Phone: [phone] 06/06/2015 2:40 PM

## 2015-06-06 NOTE — Patient Instructions (Addendum)
Take Furosemide (Lasix) 60 mg in AM and 40 mg in PM FOR 1 WEEK THEN TAKE Furosemide 40 mg Twice daily   Increase Potassium to 20 meq Twice daily   Labs today  Your physician has requested that you have an echocardiogram. Echocardiography is a painless test that uses sound waves to create images of your heart. It provides your doctor with information about the size and shape of your heart and how well your heart's chambers and valves are working. This procedure takes approximately one hour. There are no restrictions for this procedure.  Your physician recommends that you schedule a follow-up appointment in: 7-10 days with Dr Aundra Dubin

## 2015-06-07 NOTE — Progress Notes (Signed)
Patient ID: [name], male   DOB: 1925/11/17, 81 y.o.   MRN: [id number] PCP: Dr.Plotnikov  81 yo with history of CAD, ischemic CMP, CKD, and paroxysmal atrial fibrillation and atrial tachycardia presents for cardiology followup. He speaks little [place] and his daughter interprets.  No chest pain.  Patient has a history of myalgias with Crestor and Lipitor.  He has tolerated pravastatin without myalgias.  At a prior appt, he was noted to be back in atypical atrial flutter  Vs atrial tach.  It was rate-controlled and initially and was treated with a rate control/anticoagulation strategy initially.  However, he started to notice increased exertional dyspnea.  Therefore, Dr Aundra Dubin took for cardioversion back to NSR, in 1/15.  He has been on amiodarone.  He has had paroxysmal atrial tachycardia.  At 8/15 appointment, he was in atrial tachycardia with VA dissociation (but occasionally atrial activity is conducted).  Digoxin was stopped.  He had a high junctional escape with rate in 60s but felt fine.  Later in the month, he had a UTI and went into atrial tachycardia with RVR.  Was seen on 05/24/14.  Amiodarone increased to BID and Lasix was increased due to volume overload.  Amiodarone was eventually decreased back to 200 mg daily but he went back in atrial tachy with 2:1 block and was symptomatic.  Again increased amiodarone to 200 mg bid. PFTs done in 10/15 showed mild restriction, no marked abnormalities.   He fell in 11/15 and fractured his right hip. This was surgically repaired.    Recently, he has had elevated transaminases and abdominal US in 7/16 showed a nodular liver concerning for cirrhosis.  Amiodarone was decreased to 200 mg daily.   Over the last several weeks, his breathing has been worse.  He can walk 200 feet but has to stop twice to rest.  He tires very easily and is not walking much.  He fell a week ago (mechanical fall, missed a seat).  No orthopnea/PND.  No chest pain. Weight is up 14  lbs since last appointment.   Labs (9/11): K 4.1, creatinine 1.4  Labs (8/12): K 3.8, creatinine 1.3, TSH normal, LFTs normal, LDL 96, HDL 50 Labs (1/13): K 3.9, creatinine 1.3, TSH normal, LFTs normal Labs (2/13): K 3.8, creatinine 1.3 Labs (5/13): K 3.5, creatinine 1.4, BNP 331, LFTs normal, TSH normal Labs (9/13): LDL 85, HDL 53, BNP 547, LFTs normal Labs (12/13): K 4.3, creatinine 1.49 => 1.4, digoxin 1.4, TSH normal, LFTs normal Labs (1/14): digoxin 1.0, K 4.3, creatinine 1.5 Labs (3/14): free T4 normal Labs (4/14): K 5=> 3.8, creatinine 1.8 => 1.4, digoxin 0.6 Labs (5/14): TSH normal, digoxin level normal Labs (8/14): K 4.2, creatinine 1.66, AST 42, ALT 37 Labs (9/14): K 4, creatinine 1.6, digoxin 1.3, LDL 104, HDL 51, AST 41, ALT 30 Labs (11/14): K 3.6, creatinine 1.5, BNP 126 Labs (12/14): TSH 6.93 Labs (2/15): K 3.7, creatinine 1.4, TSH normal, AST 59, ALT 56 Labs (3/15): TSH low, AST 57, ALT 30, digoxin 1.2 Labs (4/15): K 4, creatinine 1.4, LFTs normal Labs (6/15): TSH normal, digoxin level 0. Labs (8/15): K 4.3, creatinine 1.7 => 1.6 => 1.63, AST 75, ALT 70, hgb 11.8, WBCs 9.7 Labs (10/15): K 4.2, creatinine 1.75, AST 93, ALT 60 Labs (12/15): hgb 10.3, K 4.1, creatinine 1.5, ALT 31, AST 77 Labs (1/16): K 4.2, creatinine 1.51, AST 69, ALT 45, hgb 11, digoxin 0.5 Labs (3/16): K 4.1, creatinine 1.26, TSH normal, HCT 36.7 Labs (6/16):  K 4.3, creatinine 1.35, AST 108, ALT 61 Labs (7/16): TSH 10.7, digoxin 0.2 Labs (8/16): AST 111, ALT 54  ECG: NSR, 1st degree AVB, poor RWP  Allergies (verified):  1) ! Crestor  2) Vytorin  3) Atenolol   Past Medical History:  1. Coronary artery disease: LHC 4/06 with 60% pLAD, 40% ramus, 60% mRCA, and 90% mPDA. No intervention. Inferior akinesis on LV-gram was suggestive of possible inferior MI with recanalization.  2. Hyperlipidemia: Myalgias with Lipitor and Crestor.  3. Hypothyroidism  4. Peripheral vascular disease  5. Anxiety  6.  Skin cancer, hx of 2008 BCC  7. Colonic polyps 8. Atrial arrhythmias (PAF and atrial tachycardia): Paroxysmal atrial fibrillation with rapid ventricular response treated with amiodarone therapy converting to normal sinus rhythm 02/2009.  He has also had atypical atrial flutter versus atrial tachycardia noted in 4/14, cardioverted to NSR in 4/14. In atypical flutter versus atrial tachycardia again in 1/15, DCCV in 1/15 to NSR. Atrial tachycardia in 7/15, 8/15, 9/15 with junctional escape in 60s or 2:1 block with faster rate. Off digoxin given PAT with block.  PFTs (10/15) mild restriction, no marked abnormalities.  9. Systolic CHF: Probable ischemic cardiomyopathy. Echo (6/10) with EF 40%, mildly dilated LV, moderate MR.  Echo (3/12): EF 30-35%, inferolateral severe hypokinesis, trivial AI, moderate to severe MR.  10. History of PNA  11. CKD. Baseline creatinine around 1.4.  12. Mitral regurgitation: Moderate by echo 6/10. 2-3+ by cath 2006.  Moderate-severe by echo in 3/12.  13. PVCs  14. BPH 15. Recurrent UTIs 16. Imbalance 17. Carotid stenosis: Dopplers (2/15) with 40-59% BICA stenosis. Carotid dopplers (1/16) with 40-59% BICA stenosis.  18. Possible tendinopathy with Cipro 19. Right hip fracture with repair in 11/15.  20. Liver cirrhosis: abdominal US (7/16) with gallstones in gallbladder, nodular liver consistent with cirrhosis.  96. Cholelithiasis  Family History:  Family History of CAD Male 1st degree relative <60   Social History:  Retired  Originally from [place], speaks little [place]  Married  Former Smoker   ROS: All systems reviewed and negative except as per HPI.   Current Outpatient Prescriptions  Medication Sig Dispense Refill  . amiodarone (PACERONE) 200 MG tablet Take 200 mg by mouth daily.    . digoxin (LANOXIN) 0.125 MG tablet Take 0.0625 mg by mouth every other day. Take at noon    . furosemide (LASIX) 40 MG tablet Take 1 tablet (40 mg total) by mouth 2 (two) times  daily. 60 tablet 6  . levothyroxine (SYNTHROID, LEVOTHROID) 150 MCG tablet Take 1 tablet (150 mcg total) by mouth daily before breakfast. 30 tablet 6  . lisinopril (PRINIVIL,ZESTRIL) 2.5 MG tablet TAKE 1 TABLET (2.5 MG TOTAL) BY MOUTH DAILY. 90 tablet 0  . LORazepam (ATIVAN) 0.5 MG tablet Take 0.5 mg by mouth 2 (two) times daily.    . metoprolol succinate (TOPROL XL) 25 MG 24 hr tablet Take 0.5 tablets (12.5 mg total) by mouth daily. 30 tablet 6  . MILK THISTLE PO Take 325 mg by mouth 3 (three) times daily.    . Multiple Vitamin (MULTIVITAMIN WITH MINERALS) TABS tablet Take 1 tablet by mouth daily.    . Omega-3 Fatty Acids (FISH OIL) 1200 MG CAPS Take 1 capsule (1,200 mg total) by mouth daily. 90 capsule 3  . potassium chloride (K-DUR,KLOR-CON) 10 MEQ tablet Take 2 tablets (20 mEq total) by mouth 2 (two) times daily. 120 tablet 6  . Sennosides-Docusate Sodium (SENNA) 8.6-50 MG TABS Take 2 tablets  by mouth daily.    . tamsulosin (FLOMAX) 0.4 MG CAPS capsule Take 0.4 mg by mouth at bedtime.     Marland Kitchen warfarin (COUMADIN) 1 MG tablet Take 1-1.5 mg by mouth daily. Take as directed per [facility]    . zolpidem (AMBIEN) 5 MG tablet Take 1 tablet (5 mg total) by mouth at bedtime as needed for sleep. 90 tablet 1   No current facility-administered medications for this encounter.    BP 112/66 mmHg  Pulse 58  Wt 136 lb 8 oz (61.916 kg)  SpO2 98% General: NAD Neck: JVP 8-9 cm; no thyromegaly or thyroid nodule.  Lungs: Slight crackles at bases.   CV: Nondisplaced PMI.  Heart regular, S1/S2, no S3/S4, 2/6 HSM at apex.  No edema.  Right carotid bruit. Abdomen: Soft, nontender, no hepatosplenomegaly, mild distention.  Neurologic: Alert and oriented x 3.  Psych: Normal affect. Extremities: No clubbing or cyanosis.   Assessment/Plan: 1. Atrial arrhythmias: History of paroxysmal atrial fibrillation and atrial tachycardia.  He tolerates these arrhythmias poorly. Patient remains in NSR at amiodarone 200 mg  daily (dose decreased with elevated LFTs). - He has had persistently elevated LFTs with probable cirrhosis noted on abdominal US.  I have discussed this with his daughter and him. He tolerates atrial arrhythmias very poorly, so we have opted to continue amiodarone at 200 mg daily.  Will check LFTs today.  - PFTs did not suggest amiodarone lung toxicity.   - Continue current Toprol XL.  - With falls, will lower warfarin goal to INR 2-2.5.  2. HYPERLIPIDEMIA: He is tolerating pravastatin without myalgias.  Gets myalgias with Lipitor and Crestor.  - Although no optimal control, will continue current pravastatin as he tolerates it.    3. MITRAL INSUFFICIENCY: Last ECHO with moderate to severe MR.  Given age and medical condition, would plan medical management.  4. CHRONIC SYSTOLIC HEART FAILURE: EF 30-35% on last echo in 3/12. NYHA class IIIb symptoms, worsened recently.  Weight is up and he appears volume overloaded on exam.    - Increase Lasix to 60 qam/40 qpm x 1 week then Lasix 40 mg bid. Check BNP.  - Increase KCl to 20 bid.     - Continue current lisinopril and Toprol XL.  - Continue digoxin, recent level was ok.  5. Carotid stenosis: Stable mild to moderate disease.  Repeat 1/17.  6. CKD: Stable creatinine, repeat today.   Loralie Champagne 06/07/2015

## 2015-06-09 ENCOUNTER — Ambulatory Visit (INDEPENDENT_AMBULATORY_CARE_PROVIDER_SITE_OTHER): Payer: Medicare Other

## 2015-06-09 DIAGNOSIS — I4891 Unspecified atrial fibrillation: Secondary | ICD-10-CM

## 2015-06-09 DIAGNOSIS — I480 Paroxysmal atrial fibrillation: Secondary | ICD-10-CM

## 2015-06-09 DIAGNOSIS — Z5181 Encounter for therapeutic drug level monitoring: Secondary | ICD-10-CM

## 2015-06-09 LAB — POCT INR: INR: 4

## 2015-06-12 ENCOUNTER — Other Ambulatory Visit: Payer: Self-pay | Admitting: Family Medicine

## 2015-06-13 ENCOUNTER — Other Ambulatory Visit (HOSPITAL_COMMUNITY): Payer: Self-pay | Admitting: *Deleted

## 2015-06-13 ENCOUNTER — Other Ambulatory Visit: Payer: Self-pay

## 2015-06-13 ENCOUNTER — Encounter (HOSPITAL_COMMUNITY): Payer: Self-pay

## 2015-06-13 ENCOUNTER — Ambulatory Visit (HOSPITAL_COMMUNITY)
Admission: RE | Admit: 2015-06-13 | Discharge: 2015-06-13 | Disposition: A | Discharge status: Home / Self Care | Payer: Medicare Other | Source: Ambulatory Visit | Attending: Internal Medicine | Admitting: Internal Medicine

## 2015-06-13 ENCOUNTER — Ambulatory Visit (HOSPITAL_BASED_OUTPATIENT_CLINIC_OR_DEPARTMENT_OTHER)
Admission: RE | Admit: 2015-06-13 | Discharge: 2015-06-13 | Disposition: A | Discharge status: Home / Self Care | Payer: Medicare Other | Source: Ambulatory Visit | Attending: Cardiology | Admitting: Cardiology

## 2015-06-13 VITALS — BP 100/62 | HR 59 | Wt 134.5 lb

## 2015-06-13 DIAGNOSIS — I480 Paroxysmal atrial fibrillation: Secondary | ICD-10-CM

## 2015-06-13 DIAGNOSIS — I739 Peripheral vascular disease, unspecified: Secondary | ICD-10-CM | POA: Insufficient documentation

## 2015-06-13 DIAGNOSIS — F419 Anxiety disorder, unspecified: Secondary | ICD-10-CM | POA: Insufficient documentation

## 2015-06-13 DIAGNOSIS — I2510 Atherosclerotic heart disease of native coronary artery without angina pectoris: Secondary | ICD-10-CM | POA: Diagnosis not present

## 2015-06-13 DIAGNOSIS — Z79899 Other long term (current) drug therapy: Secondary | ICD-10-CM | POA: Insufficient documentation

## 2015-06-13 DIAGNOSIS — E785 Hyperlipidemia, unspecified: Secondary | ICD-10-CM | POA: Diagnosis not present

## 2015-06-13 DIAGNOSIS — E039 Hypothyroidism, unspecified: Secondary | ICD-10-CM | POA: Insufficient documentation

## 2015-06-13 DIAGNOSIS — Z87891 Personal history of nicotine dependence: Secondary | ICD-10-CM | POA: Diagnosis not present

## 2015-06-13 DIAGNOSIS — I255 Ischemic cardiomyopathy: Secondary | ICD-10-CM | POA: Diagnosis not present

## 2015-06-13 DIAGNOSIS — I5022 Chronic systolic (congestive) heart failure: Secondary | ICD-10-CM | POA: Diagnosis present

## 2015-06-13 DIAGNOSIS — I080 Rheumatic disorders of both mitral and aortic valves: Secondary | ICD-10-CM | POA: Diagnosis not present

## 2015-06-13 DIAGNOSIS — I340 Nonrheumatic mitral (valve) insufficiency: Secondary | ICD-10-CM | POA: Insufficient documentation

## 2015-06-13 DIAGNOSIS — I252 Old myocardial infarction: Secondary | ICD-10-CM | POA: Insufficient documentation

## 2015-06-13 DIAGNOSIS — Z8249 Family history of ischemic heart disease and other diseases of the circulatory system: Secondary | ICD-10-CM | POA: Diagnosis not present

## 2015-06-13 DIAGNOSIS — N189 Chronic kidney disease, unspecified: Secondary | ICD-10-CM | POA: Diagnosis not present

## 2015-06-13 DIAGNOSIS — I471 Supraventricular tachycardia: Secondary | ICD-10-CM | POA: Insufficient documentation

## 2015-06-13 LAB — BASIC METABOLIC PANEL
ANION GAP: 9 (ref 5–15)
BUN: 23 mg/dL — ABNORMAL HIGH (ref 6–20)
CHLORIDE: 102 mmol/L (ref 101–111)
CO2: 24 mmol/L (ref 22–32)
Calcium: 8 mg/dL — ABNORMAL LOW (ref 8.9–10.3)
Creatinine, Ser: 1.7 mg/dL — ABNORMAL HIGH (ref 0.61–1.24)
GFR calc non Af Amer: 34 mL/min — ABNORMAL LOW (ref >60)
GFR, EST AFRICAN AMERICAN: 40 mL/min — AB (ref >60)
Glucose, Bld: 120 mg/dL — ABNORMAL HIGH (ref 65–99)
POTASSIUM: 4.3 mmol/L (ref 3.5–5.1)
Sodium: 135 mmol/L (ref 135–145)

## 2015-06-13 LAB — BRAIN NATRIURETIC PEPTIDE: B Natriuretic Peptide: 299.7 pg/mL — ABNORMAL HIGH (ref 0.0–100.0)

## 2015-06-13 MED ORDER — POTASSIUM CHLORIDE CRYS ER 10 MEQ PO TBCR: 20.0000 meq | EXTENDED_RELEASE_TABLET | Freq: Two times a day (BID) | ORAL | Status: DC

## 2015-06-13 MED ORDER — FUROSEMIDE 20 MG PO TABS: 40.0000 mg | ORAL_TABLET | Freq: Two times a day (BID) | ORAL | Status: DC

## 2015-06-13 MED ORDER — AMIODARONE HCL 200 MG PO TABS: 100.0000 mg | ORAL_TABLET | Freq: Every day | ORAL | Status: DC

## 2015-06-13 MED ORDER — FUROSEMIDE 20 MG PO TABS: ORAL_TABLET | ORAL | Status: DC

## 2015-06-13 NOTE — Patient Instructions (Signed)
DECREASE Lasix to 40mg  (2 tablets) twice a day.  DECREASE Amiodarone to 100mg  (1/2 tablet) daily.  Routine lab work today. ( bmet bnp) Will notify you of abnormal results  FOLLOW UP: 3 weeks

## 2015-06-13 NOTE — Progress Notes (Signed)
  Echocardiogram 2D Echocardiogram has been performed.  Diamond Nickel 06/13/2015, 2:11 PM

## 2015-06-13 NOTE — Progress Notes (Signed)
Patient ID: [name], male   DOB: 15-Jun-1926, 81 y.o.   MRN: [id number] PCP: Dr.Plotnikov  80 yo with history of CAD, ischemic CMP, CKD, and paroxysmal atrial fibrillation and atrial tachycardia presents for cardiology followup. He speaks little [place] and his daughter interprets.  No chest pain.  Patient has a history of myalgias with Crestor and Lipitor.  He has tolerated pravastatin without myalgias.  At a prior appt, he was noted to be back in atypical atrial flutter  Vs atrial tach.  It was rate-controlled and initially and was treated with a rate control/anticoagulation strategy initially.  However, he started to notice increased exertional dyspnea.  Therefore, Dr Aundra Dubin took for cardioversion back to NSR, in 1/15.  He has been on amiodarone.  He has had paroxysmal atrial tachycardia.  At 8/15 appointment, he was in atrial tachycardia with VA dissociation (but occasionally atrial activity is conducted).  Digoxin was stopped.  He had a high junctional escape with rate in 60s but felt fine.  Later in the month, he had a UTI and went into atrial tachycardia with RVR.  Was seen on 05/24/14.  Amiodarone increased to BID and Lasix was increased due to volume overload.  Amiodarone was eventually decreased back to 200 mg daily but he went back in atrial tachy with 2:1 block and was symptomatic.  Again increased amiodarone to 200 mg bid. PFTs done in 10/15 showed mild restriction, no marked abnormalities.   He fell in 11/15 and fractured his right hip. This was surgically repaired.    He has had elevated transaminases and abdominal US in 7/16 showed a nodular liver concerning for cirrhosis.  Amiodarone was decreased to 200 mg daily.   Over the last several weeks, his breathing has been worse.  He can walk 200 feet but has to stop twice to rest.  He tires very easily and is not walking much.  No orthopnea/PND.  No chest pain.  At last appointment, weight was noted to be up 14 lbs. Lasix was increased to  60 qam/40 qpm for 1 week with plan to continue it at 40 mg bid. Since last appointment, he has lost 2 lbs.  He is walking a little better but still not back to baseline.  His daughter thinks that he is getting depressed.  Echo was done today, showing EF 30% with wall motion abnormalities.  There was severe infarct-related MR.  .   Labs (9/11): K 4.1, creatinine 1.4  Labs (8/12): K 3.8, creatinine 1.3, TSH normal, LFTs normal, LDL 96, HDL 50 Labs (1/13): K 3.9, creatinine 1.3, TSH normal, LFTs normal Labs (2/13): K 3.8, creatinine 1.3 Labs (5/13): K 3.5, creatinine 1.4, BNP 331, LFTs normal, TSH normal Labs (9/13): LDL 85, HDL 53, BNP 547, LFTs normal Labs (12/13): K 4.3, creatinine 1.49 => 1.4, digoxin 1.4, TSH normal, LFTs normal Labs (1/14): digoxin 1.0, K 4.3, creatinine 1.5 Labs (3/14): free T4 normal Labs (4/14): K 5=> 3.8, creatinine 1.8 => 1.4, digoxin 0.6 Labs (5/14): TSH normal, digoxin level normal Labs (8/14): K 4.2, creatinine 1.66, AST 42, ALT 37 Labs (9/14): K 4, creatinine 1.6, digoxin 1.3, LDL 104, HDL 51, AST 41, ALT 30 Labs (11/14): K 3.6, creatinine 1.5, BNP 126 Labs (12/14): TSH 6.93 Labs (2/15): K 3.7, creatinine 1.4, TSH normal, AST 59, ALT 56 Labs (3/15): TSH low, AST 57, ALT 30, digoxin 1.2 Labs (4/15): K 4, creatinine 1.4, LFTs normal Labs (6/15): TSH normal, digoxin level 0. Labs (8/15): K 4.3, creatinine  1.7 => 1.6 => 1.63, AST 75, ALT 70, hgb 11.8, WBCs 9.7 Labs (10/15): K 4.2, creatinine 1.75, AST 93, ALT 60 Labs (12/15): hgb 10.3, K 4.1, creatinine 1.5, ALT 31, AST 77 Labs (1/16): K 4.2, creatinine 1.51, AST 69, ALT 45, hgb 11, digoxin 0.5 Labs (3/16): K 4.1, creatinine 1.26, TSH normal, HCT 36.7 Labs (6/16): K 4.3, creatinine 1.35, AST 108, ALT 61 Labs (7/16): TSH 10.7, digoxin 0.2 Labs (8/16): AST 111, ALT 54 Labs (9/16): K 4.3, creatinine 1.73, AST 135, ALT 68, BNP 287  Allergies (verified):  1) ! Crestor  2) Vytorin  3) Atenolol   Past Medical  History:  1. Coronary artery disease: LHC 4/06 with 60% pLAD, 40% ramus, 60% mRCA, and 90% mPDA. No intervention. Inferior akinesis on LV-gram was suggestive of possible inferior MI with recanalization.  2. Hyperlipidemia: Myalgias with Lipitor and Crestor.  3. Hypothyroidism  4. Peripheral vascular disease  5. Anxiety  6. Skin cancer, hx of 2008 BCC  7. Colonic polyps 8. Atrial arrhythmias (PAF and atrial tachycardia): Paroxysmal atrial fibrillation with rapid ventricular response treated with amiodarone therapy converting to normal sinus rhythm 02/2009.  He has also had atypical atrial flutter versus atrial tachycardia noted in 4/14, cardioverted to NSR in 4/14. In atypical flutter versus atrial tachycardia again in 1/15, DCCV in 1/15 to NSR. Atrial tachycardia in 7/15, 8/15, 9/15 with junctional escape in 60s or 2:1 block with faster rate. Off digoxin given PAT with block.  PFTs (10/15) mild restriction, no marked abnormalities.  9. Systolic CHF: Probable ischemic cardiomyopathy. Echo (6/10) with EF 40%, mildly dilated LV, moderate MR.  Echo (3/12): EF 30-35%, inferolateral severe hypokinesis, trivial AI, moderate to severe MR.  Echo (9/16) with EF 30%, inferolateral akinesis, anterolateral severe hypokinesis, inferior hypokinesis, moderate diastolic dysfunction, severe MR (likely infarct-related).  10. History of PNA  11. CKD. Baseline creatinine around 1.4.  12. Mitral regurgitation: Moderate by echo 6/10. 2-3+ by cath 2006.  Moderate-severe by echo in 3/12. Severe by echo in 9/16.  Likely infarct-related MR.  13. PVCs  14. BPH 15. Recurrent UTIs 16. Imbalance 17. Carotid stenosis: Dopplers (2/15) with 40-59% BICA stenosis. Carotid dopplers (1/16) with 40-59% BICA stenosis.  18. Possible tendinopathy with Cipro 19. Right hip fracture with repair in 11/15.  20. Liver cirrhosis: abdominal US (7/16) with gallstones in gallbladder, nodular liver consistent with cirrhosis.  9.  Cholelithiasis  Family History:  Family History of CAD Male 1st degree relative <60   Social History:  Retired  Originally from [place], speaks little [place]  Married  Former Smoker   ROS: All systems reviewed and negative except as per HPI.   Current Outpatient Prescriptions  Medication Sig Dispense Refill  . amiodarone (PACERONE) 200 MG tablet Take 0.5 tablets (100 mg total) by mouth daily. 15 tablet 3  . digoxin (LANOXIN) 0.125 MG tablet Take 0.0625 mg by mouth every other day. Take at noon    . furosemide (LASIX) 20 MG tablet Take 2 tablets (40 mg total) by mouth 2 (two) times daily. Take 60mg  in the morning and 40mg  in the evening. 120 tablet 3  . levothyroxine (SYNTHROID, LEVOTHROID) 150 MCG tablet Take 1 tablet (150 mcg total) by mouth daily before breakfast. 30 tablet 6  . lisinopril (PRINIVIL,ZESTRIL) 2.5 MG tablet TAKE 1 TABLET (2.5 MG TOTAL) BY MOUTH DAILY. 90 tablet 0  . LORazepam (ATIVAN) 0.5 MG tablet Take 0.5 mg by mouth 2 (two) times daily.    . metoprolol succinate (  TOPROL XL) 25 MG 24 hr tablet Take 0.5 tablets (12.5 mg total) by mouth daily. 30 tablet 6  . MILK THISTLE PO Take 325 mg by mouth 3 (three) times daily.    . Multiple Vitamin (MULTIVITAMIN WITH MINERALS) TABS tablet Take 1 tablet by mouth daily.    . Omega-3 Fatty Acids (FISH OIL) 1200 MG CAPS Take 1 capsule (1,200 mg total) by mouth daily. 90 capsule 3  . Sennosides-Docusate Sodium (SENNA) 8.6-50 MG TABS Take 2 tablets by mouth daily.    . tamsulosin (FLOMAX) 0.4 MG CAPS capsule Take 0.4 mg by mouth at bedtime.     Marland Kitchen warfarin (COUMADIN) 1 MG tablet Take 1-1.5 mg by mouth daily. Take as directed per [facility]    . potassium chloride (K-DUR,KLOR-CON) 10 MEQ tablet Take 2 tablets (20 mEq total) by mouth 2 (two) times daily. 120 tablet 6  . zolpidem (AMBIEN) 5 MG tablet TAKE 1 TABLET BY MOUTH AT BEDTIME AS NEEDED FOR SLEEP 90 tablet 1   No current facility-administered medications for this encounter.     BP 100/62 mmHg  Pulse 59  Wt 134 lb 8 oz (61.009 kg)  SpO2 98% General: NAD, frail Neck: JVP 7 cm; no thyromegaly or thyroid nodule.  Lungs: Slight crackles at bases.   CV: Nondisplaced PMI.  Heart regular, S1/S2, no S3/S4, 2/6 HSM at apex.  No edema.  Right carotid bruit. Abdomen: Soft, nontender, no hepatosplenomegaly, mild distention.  Neurologic: Alert and oriented x 3.  Psych: Normal affect. Extremities: No clubbing or cyanosis.   Assessment/Plan: 1. Atrial arrhythmias: History of paroxysmal atrial fibrillation and atrial tachycardia.  He tolerates these arrhythmias poorly. Patient remains in NSR at amiodarone 200 mg daily (dose decreased with elevated LFTs). - He has had persistently elevated LFTs with probable cirrhosis noted on abdominal US.  I have discussed this with his daughter and him. He tolerates atrial arrhythmias very poorly, so we have opted to continue amiodarone.  As LFTs were a bit higher when checked at last appointment, I will decrease amiodarone to 100 mg daily.    - PFTs did not suggest amiodarone lung toxicity.   - Continue current Toprol XL.  - With falls, have lowered warfarin goal to INR 2-2.5.  2. HYPERLIPIDEMIA: He is tolerating pravastatin without myalgias.  Gets myalgias with Lipitor and Crestor.  - Although no optimal control, will continue current pravastatin as he tolerates it.    3. MITRAL INSUFFICIENCY: Echo today with severe MR, likely infarct-related in the setting of inferolateral akinesis and anterolateral hypokinesis.  Given age and medical condition, would plan medical management.  4. CHRONIC SYSTOLIC HEART FAILURE: EF 30% on today's echo. NYHA class IIIb symptoms, mildly improved on higher Lasix dose.  Volume status does look better. - Continue Lasix 40 mg bid long-term.  - Check BMET/BNP today.     - Continue current lisinopril and Toprol XL.  - Continue digoxin, recent level was ok.  5. Carotid stenosis: Stable mild to moderate disease.   Repeat 1/17.  6. CKD: Creatinine a bit higher when last checked, will repeat today.   Followup in 3 wks.   Loralie Champagne 06/13/2015

## 2015-06-14 ENCOUNTER — Other Ambulatory Visit (HOSPITAL_COMMUNITY): Payer: Self-pay | Admitting: *Deleted

## 2015-06-14 MED ORDER — FUROSEMIDE 20 MG PO TABS: 40.0000 mg | ORAL_TABLET | Freq: Two times a day (BID) | ORAL | Status: DC

## 2015-06-14 NOTE — Telephone Encounter (Signed)
Lasix RX 40 mg BID

## 2015-06-15 ENCOUNTER — Encounter: Payer: Self-pay | Admitting: Cardiology

## 2015-06-16 ENCOUNTER — Ambulatory Visit (INDEPENDENT_AMBULATORY_CARE_PROVIDER_SITE_OTHER): Payer: Medicare Other | Admitting: *Deleted

## 2015-06-16 DIAGNOSIS — Z5181 Encounter for therapeutic drug level monitoring: Secondary | ICD-10-CM

## 2015-06-16 DIAGNOSIS — I480 Paroxysmal atrial fibrillation: Secondary | ICD-10-CM | POA: Diagnosis not present

## 2015-06-16 DIAGNOSIS — I4891 Unspecified atrial fibrillation: Secondary | ICD-10-CM

## 2015-06-16 LAB — POCT INR: INR: 3.6

## 2015-06-18 ENCOUNTER — Other Ambulatory Visit (HOSPITAL_COMMUNITY): Payer: Self-pay | Admitting: Cardiology

## 2015-06-21 ENCOUNTER — Encounter (HOSPITAL_COMMUNITY): Payer: Medicare Other

## 2015-06-24 ENCOUNTER — Ambulatory Visit (INDEPENDENT_AMBULATORY_CARE_PROVIDER_SITE_OTHER): Payer: Medicare Other | Admitting: *Deleted

## 2015-06-24 DIAGNOSIS — I480 Paroxysmal atrial fibrillation: Secondary | ICD-10-CM | POA: Diagnosis not present

## 2015-06-24 DIAGNOSIS — I4891 Unspecified atrial fibrillation: Secondary | ICD-10-CM | POA: Diagnosis not present

## 2015-06-24 DIAGNOSIS — Z5181 Encounter for therapeutic drug level monitoring: Secondary | ICD-10-CM

## 2015-06-24 LAB — POCT INR: INR: 2.8

## 2015-06-28 ENCOUNTER — Other Ambulatory Visit: Payer: Self-pay | Admitting: Internal Medicine

## 2015-06-29 ENCOUNTER — Other Ambulatory Visit (HOSPITAL_COMMUNITY): Payer: Self-pay | Admitting: Internal Medicine

## 2015-07-04 ENCOUNTER — Ambulatory Visit (INDEPENDENT_AMBULATORY_CARE_PROVIDER_SITE_OTHER): Payer: Medicare Other

## 2015-07-04 DIAGNOSIS — Z5181 Encounter for therapeutic drug level monitoring: Secondary | ICD-10-CM | POA: Diagnosis not present

## 2015-07-04 DIAGNOSIS — I480 Paroxysmal atrial fibrillation: Secondary | ICD-10-CM

## 2015-07-04 DIAGNOSIS — I4891 Unspecified atrial fibrillation: Secondary | ICD-10-CM | POA: Diagnosis not present

## 2015-07-04 LAB — POCT INR: INR: 2.8

## 2015-07-07 ENCOUNTER — Encounter: Payer: Self-pay | Admitting: Internal Medicine

## 2015-07-07 ENCOUNTER — Ambulatory Visit (INDEPENDENT_AMBULATORY_CARE_PROVIDER_SITE_OTHER): Payer: Medicare Other | Admitting: Internal Medicine

## 2015-07-07 ENCOUNTER — Ambulatory Visit: Payer: Self-pay | Admitting: Interventional Cardiology

## 2015-07-07 ENCOUNTER — Telehealth (HOSPITAL_COMMUNITY): Payer: Self-pay | Admitting: *Deleted

## 2015-07-07 ENCOUNTER — Other Ambulatory Visit (INDEPENDENT_AMBULATORY_CARE_PROVIDER_SITE_OTHER): Payer: Medicare Other

## 2015-07-07 ENCOUNTER — Ambulatory Visit (HOSPITAL_COMMUNITY)
Admission: RE | Admit: 2015-07-07 | Discharge: 2015-07-07 | Disposition: A | Discharge status: Home / Self Care | Payer: Medicare Other | Source: Ambulatory Visit | Attending: Cardiology | Admitting: Cardiology

## 2015-07-07 VITALS — BP 66/1 | HR 60

## 2015-07-07 VITALS — BP 90/50 | HR 40

## 2015-07-07 DIAGNOSIS — I5023 Acute on chronic systolic (congestive) heart failure: Secondary | ICD-10-CM

## 2015-07-07 DIAGNOSIS — I739 Peripheral vascular disease, unspecified: Secondary | ICD-10-CM | POA: Diagnosis not present

## 2015-07-07 DIAGNOSIS — Z79899 Other long term (current) drug therapy: Secondary | ICD-10-CM | POA: Diagnosis not present

## 2015-07-07 DIAGNOSIS — I5022 Chronic systolic (congestive) heart failure: Secondary | ICD-10-CM | POA: Diagnosis not present

## 2015-07-07 DIAGNOSIS — I480 Paroxysmal atrial fibrillation: Secondary | ICD-10-CM | POA: Diagnosis not present

## 2015-07-07 DIAGNOSIS — R202 Paresthesia of skin: Secondary | ICD-10-CM

## 2015-07-07 DIAGNOSIS — I6523 Occlusion and stenosis of bilateral carotid arteries: Secondary | ICD-10-CM

## 2015-07-07 DIAGNOSIS — K7460 Unspecified cirrhosis of liver: Secondary | ICD-10-CM

## 2015-07-07 DIAGNOSIS — R7989 Other specified abnormal findings of blood chemistry: Secondary | ICD-10-CM

## 2015-07-07 DIAGNOSIS — N189 Chronic kidney disease, unspecified: Secondary | ICD-10-CM | POA: Insufficient documentation

## 2015-07-07 DIAGNOSIS — R410 Disorientation, unspecified: Secondary | ICD-10-CM

## 2015-07-07 DIAGNOSIS — Z5181 Encounter for therapeutic drug level monitoring: Secondary | ICD-10-CM

## 2015-07-07 DIAGNOSIS — F05 Delirium due to known physiological condition: Secondary | ICD-10-CM | POA: Diagnosis not present

## 2015-07-07 DIAGNOSIS — I2510 Atherosclerotic heart disease of native coronary artery without angina pectoris: Secondary | ICD-10-CM | POA: Diagnosis not present

## 2015-07-07 DIAGNOSIS — Z8249 Family history of ischemic heart disease and other diseases of the circulatory system: Secondary | ICD-10-CM | POA: Insufficient documentation

## 2015-07-07 DIAGNOSIS — G4700 Insomnia, unspecified: Secondary | ICD-10-CM

## 2015-07-07 DIAGNOSIS — Z87891 Personal history of nicotine dependence: Secondary | ICD-10-CM | POA: Diagnosis not present

## 2015-07-07 DIAGNOSIS — Z66 Do not resuscitate: Secondary | ICD-10-CM

## 2015-07-07 DIAGNOSIS — N183 Chronic kidney disease, stage 3 unspecified: Secondary | ICD-10-CM

## 2015-07-07 DIAGNOSIS — I959 Hypotension, unspecified: Secondary | ICD-10-CM | POA: Insufficient documentation

## 2015-07-07 DIAGNOSIS — E039 Hypothyroidism, unspecified: Secondary | ICD-10-CM | POA: Diagnosis not present

## 2015-07-07 DIAGNOSIS — N179 Acute kidney failure, unspecified: Secondary | ICD-10-CM | POA: Diagnosis not present

## 2015-07-07 DIAGNOSIS — R945 Abnormal results of liver function studies: Secondary | ICD-10-CM

## 2015-07-07 DIAGNOSIS — K7581 Nonalcoholic steatohepatitis (NASH): Secondary | ICD-10-CM

## 2015-07-07 DIAGNOSIS — N289 Disorder of kidney and ureter, unspecified: Secondary | ICD-10-CM | POA: Diagnosis not present

## 2015-07-07 DIAGNOSIS — E785 Hyperlipidemia, unspecified: Secondary | ICD-10-CM | POA: Diagnosis not present

## 2015-07-07 DIAGNOSIS — R001 Bradycardia, unspecified: Secondary | ICD-10-CM

## 2015-07-07 DIAGNOSIS — I509 Heart failure, unspecified: Secondary | ICD-10-CM | POA: Insufficient documentation

## 2015-07-07 LAB — CBC WITH DIFFERENTIAL/PLATELET
Basophils Absolute: 0 10*3/uL (ref 0.0–0.1)
Basophils Relative: 0.2 % (ref 0.0–3.0)
EOS PCT: 0.1 % (ref 0.0–5.0)
Eosinophils Absolute: 0 10*3/uL (ref 0.0–0.7)
HEMATOCRIT: 39.8 % (ref 39.0–52.0)
HEMOGLOBIN: 13.2 g/dL (ref 13.0–17.0)
LYMPHS ABS: 1.4 10*3/uL (ref 0.7–4.0)
LYMPHS PCT: 15.6 % (ref 12.0–46.0)
MCHC: 33.1 g/dL (ref 30.0–36.0)
MCV: 93.6 fl (ref 78.0–100.0)
MONOS PCT: 10.9 % (ref 3.0–12.0)
Monocytes Absolute: 1 10*3/uL (ref 0.1–1.0)
NEUTROS PCT: 73.2 % (ref 43.0–77.0)
Neutro Abs: 6.6 10*3/uL (ref 1.4–7.7)
Platelets: 135 10*3/uL — ABNORMAL LOW (ref 150.0–400.0)
RBC: 4.25 Mil/uL (ref 4.22–5.81)
RDW: 20.1 % — ABNORMAL HIGH (ref 11.5–15.5)
WBC: 9 10*3/uL (ref 4.0–10.5)

## 2015-07-07 LAB — URINALYSIS, ROUTINE W REFLEX MICROSCOPIC
Bilirubin Urine: NEGATIVE
Ketones, ur: NEGATIVE
NITRITE: POSITIVE — AB
SPECIFIC GRAVITY, URINE: 1.01 (ref 1.000–1.030)
TOTAL PROTEIN, URINE-UPE24: NEGATIVE
URINE GLUCOSE: NEGATIVE
UROBILINOGEN UA: 0.2 (ref 0.0–1.0)
pH: 5.5 (ref 5.0–8.0)

## 2015-07-07 LAB — HEPATIC FUNCTION PANEL
ALBUMIN: 2.3 g/dL — AB (ref 3.5–5.2)
ALT: 60 U/L — AB (ref 0–53)
AST: 102 U/L — AB (ref 0–37)
Alkaline Phosphatase: 147 U/L — ABNORMAL HIGH (ref 39–117)
Bilirubin, Direct: 0.9 mg/dL — ABNORMAL HIGH (ref 0.0–0.3)
TOTAL PROTEIN: 6.9 g/dL (ref 6.0–8.3)
Total Bilirubin: 1.9 mg/dL — ABNORMAL HIGH (ref 0.2–1.2)

## 2015-07-07 LAB — AMMONIA: AMMONIA: 40 umol/L — AB (ref 11–35)

## 2015-07-07 LAB — BASIC METABOLIC PANEL
BUN: 41 mg/dL — ABNORMAL HIGH (ref 6–23)
CHLORIDE: 100 meq/L (ref 96–112)
CO2: 24 mEq/L (ref 19–32)
Calcium: 8.4 mg/dL (ref 8.4–10.5)
Creatinine, Ser: 2.87 mg/dL — ABNORMAL HIGH (ref 0.40–1.50)
GFR: 22.23 mL/min — AB (ref >60.00)
Glucose, Bld: 96 mg/dL (ref 70–99)
POTASSIUM: 5.3 meq/L — AB (ref 3.5–5.1)
SODIUM: 133 meq/L — AB (ref 135–145)

## 2015-07-07 LAB — PROTIME-INR
INR: 3 ratio — ABNORMAL HIGH (ref 0.8–1.0)
Prothrombin Time: 32.6 s — ABNORMAL HIGH (ref 9.6–13.1)

## 2015-07-07 LAB — VITAMIN B12: Vitamin B-12: 1382 pg/mL — ABNORMAL HIGH (ref 211–911)

## 2015-07-07 LAB — MAGNESIUM: MAGNESIUM: 2.2 mg/dL (ref 1.5–2.5)

## 2015-07-07 LAB — TSH: TSH: 3.27 u[IU]/mL (ref 0.35–4.50)

## 2015-07-07 LAB — APTT: aPTT: 44.3 s — ABNORMAL HIGH (ref 23.4–32.7)

## 2015-07-07 LAB — SEDIMENTATION RATE: Sed Rate: 43 mm/hr — ABNORMAL HIGH (ref 0–22)

## 2015-07-07 MED ORDER — TRAZODONE HCL 50 MG PO TABS: 50.0000 mg | ORAL_TABLET | Freq: Every evening | ORAL | Status: AC | PRN

## 2015-07-07 MED ORDER — ESZOPICLONE 2 MG PO TABS
2.0000 mg | ORAL_TABLET | Freq: Every evening | ORAL | Status: DC | PRN
Start: 2015-07-07 — End: 2015-07-07

## 2015-07-07 NOTE — Assessment & Plan Note (Signed)
Repeat labs:

## 2015-07-07 NOTE — Assessment & Plan Note (Signed)
Labs Furosemide Hold Digoxin, Toprol due to bradycardia and low BP NCB Family refused ER

## 2015-07-07 NOTE — Assessment & Plan Note (Addendum)
10/16 x 1 week ?hepatic encephalopathy vs other. R/o other causes Labs, including ammonia Family refused ER referral  PCS referral

## 2015-07-07 NOTE — Progress Notes (Signed)
Pre visit review using our clinic review tool, if applicable. No additional management support is needed unless otherwise documented below in the visit note. 

## 2015-07-07 NOTE — Assessment & Plan Note (Signed)
Labs, including ammonia Family refused ER referral

## 2015-07-07 NOTE — Telephone Encounter (Signed)
Per Dr Aundra Dubin called referral into [facility], they will contact pt's daughter tonight and see pt in AM

## 2015-07-07 NOTE — Progress Notes (Signed)
Subjective:  Patient ID: [name], male    DOB: 10-28-1925  Age: 81 y.o. MRN: [id number]  CC: No chief complaint on file.   HPI Mcadoo Muzquiz presents for worsening confusion, weak legs x 1 week. Low urinary output (1000 ml/ day). C/o x1 mo adominal enlargement abd - it was noted first at the Cardiology appt in Sept. F/u CHF, dementia. C/o insomnia and agitation at night.   Outpatient Prescriptions Prior to Visit  Medication Sig Dispense Refill  . amiodarone (PACERONE) 200 MG tablet Take 0.5 tablets (100 mg total) by mouth daily. 15 tablet 3  . digoxin (LANOXIN) 0.125 MG tablet Take 0.0625 mg by mouth every other day. Take at noon    . furosemide (LASIX) 20 MG tablet Take 2 tablets (40 mg total) by mouth 2 (two) times daily. 120 tablet 3  . KLOR-CON M10 10 MEQ tablet TAKE 2 TABLETS (20 MEQ TOTAL) BY MOUTH DAILY. 60 tablet 6  . levothyroxine (SYNTHROID, LEVOTHROID) 150 MCG tablet Take 1 tablet (150 mcg total) by mouth daily before breakfast. 30 tablet 6  . lisinopril (PRINIVIL,ZESTRIL) 2.5 MG tablet TAKE 1 TABLET (2.5 MG TOTAL) BY MOUTH DAILY. 90 tablet 0  . LORazepam (ATIVAN) 0.5 MG tablet Take 0.5 mg by mouth 2 (two) times daily.    . metoprolol succinate (TOPROL XL) 25 MG 24 hr tablet Take 0.5 tablets (12.5 mg total) by mouth daily. 30 tablet 6  . metoprolol succinate (TOPROL-XL) 25 MG 24 hr tablet Take 0.5 tablets (12.5 mg total) by mouth daily. 15 tablet 6  . MILK THISTLE PO Take 325 mg by mouth 3 (three) times daily.    . Multiple Vitamin (MULTIVITAMIN WITH MINERALS) TABS tablet Take 1 tablet by mouth daily.    . Omega-3 Fatty Acids (FISH OIL) 1200 MG CAPS Take 1 capsule (1,200 mg total) by mouth daily. 90 capsule 3  . potassium chloride (K-DUR,KLOR-CON) 10 MEQ tablet Take 2 tablets (20 mEq total) by mouth 2 (two) times daily. 120 tablet 6  . Sennosides-Docusate Sodium (SENNA) 8.6-50 MG TABS Take 2 tablets by mouth daily.    . tamsulosin (FLOMAX) 0.4 MG CAPS capsule Take 0.4  mg by mouth at bedtime.     Marland Kitchen warfarin (COUMADIN) 1 MG tablet Take 1-1.5 mg by mouth daily. Take as directed per [facility]    . zolpidem (AMBIEN) 5 MG tablet TAKE 1 TABLET BY MOUTH AT BEDTIME AS NEEDED FOR SLEEP 90 tablet 1   No facility-administered medications prior to visit.    ROS Review of Systems  Constitutional: Negative for chills, appetite change, fatigue and unexpected weight change.  HENT: Negative for congestion, nosebleeds, sneezing, sore throat and trouble swallowing.   Eyes: Negative for itching and visual disturbance.  Respiratory: Negative for apnea, cough and wheezing.   Cardiovascular: Negative for chest pain, palpitations and leg swelling.  Gastrointestinal: Positive for abdominal distention. Negative for nausea, vomiting, diarrhea and blood in stool.  Genitourinary: Negative for dysuria, frequency and hematuria.  Musculoskeletal: Positive for gait problem. Negative for back pain, joint swelling and neck pain.  Skin: Negative for rash.  Neurological: Positive for weakness. Negative for dizziness, tremors and speech difficulty.  Hematological: Negative for adenopathy. Does not bruise/bleed easily.  Psychiatric/Behavioral: Positive for behavioral problems, confusion, decreased concentration and agitation. Negative for suicidal ideas, sleep disturbance, self-injury and dysphoric mood. The patient is not nervous/anxious.     Objective:  BP 90/50 mmHg  Pulse 40  Wt   SpO2 97%  BP  Readings from Last 3 Encounters:  07/07/15 90/50  06/13/15 100/62  06/06/15 112/66    Wt Readings from Last 3 Encounters:  06/13/15 134 lb 8 oz (61.009 kg)  06/06/15 136 lb 8 oz (61.916 kg)  04/02/15 122 lb (55.339 kg)    Physical Exam  Constitutional: He is oriented to person, place, and time. He appears well-developed. No distress.  NAD  HENT:  Mouth/Throat: Oropharynx is clear and moist.  Eyes: Conjunctivae are normal. Pupils are equal, round, and reactive to light.    Neck: Normal range of motion. No JVD present. No thyromegaly present.  Cardiovascular: Normal rate, regular rhythm, normal heart sounds and intact distal pulses.  Exam reveals no gallop and no friction rub.   No murmur heard. Pulmonary/Chest: Effort normal and breath sounds normal. No respiratory distress. He has no wheezes. He has no rales. He exhibits no tenderness.  Abdominal: Soft. Bowel sounds are normal. He exhibits distension. He exhibits no mass. There is no tenderness. There is no rebound and no guarding.  Musculoskeletal: Normal range of motion. He exhibits no edema or tenderness.  Lymphadenopathy:    He has no cervical adenopathy.  Neurological: He is alert and oriented to person, place, and time. He has normal reflexes. No cranial nerve deficit. He exhibits normal muscle tone. He displays a negative Romberg sign. Coordination and gait normal.  Skin: Skin is warm and dry. No rash noted.  Psychiatric: He has a normal mood and affect. His behavior is normal. Judgment and thought content normal.  In a w/c Alert, cooperative, disoriented Marked bradycardia HR<40 Low BP - poorly audible tones Ascites Asterixis (+) No jaundice No pronator drift LEs w/1+ edema B Catheter  Lab Results  Component Value Date   WBC 7.4 12/22/2014   HGB 12.5* 12/22/2014   HCT 36.7* 12/22/2014   PLT 196.0 12/22/2014   GLUCOSE 120* 06/13/2015   CHOL 176 01/04/2014   TRIG 107.0 01/04/2014   HDL 62.40 01/04/2014   LDLDIRECT 133.2 01/12/2011   LDLCALC 92 01/04/2014   ALT 68* 06/06/2015   AST 135* 06/06/2015   NA 135 06/13/2015   K 4.3 06/13/2015   CL 102 06/13/2015   CREATININE 1.70* 06/13/2015   BUN 23* 06/13/2015   CO2 24 06/13/2015   TSH 10.70* 04/06/2015   PSA 1.68 02/04/2008   INR 2.8 07/04/2015   HGBA1C 5.3 12/22/2014   A complex case No Code Blue discussed and confirmed No results found.  Assessment & Plan:   Diagnoses and all orders for this visit:  Acute confusional  state -     Urinalysis; Future -     Ammonia; Future -     Basic metabolic panel; Future -     CBC with Differential/Platelet; Future -     Hepatic function panel; Future -     Magnesium; Future -     Sedimentation rate; Future -     TSH; Future -     Vitamin B12; Future -     Digoxin level; Future -     Protime-INR; Future -     APTT; Future  Liver cirrhosis secondary to NASH -     Urinalysis; Future -     Ammonia; Future -     Basic metabolic panel; Future -     CBC with Differential/Platelet; Future -     Hepatic function panel; Future -     Magnesium; Future -     Sedimentation rate; Future -  TSH; Future -     Vitamin B12; Future -     Digoxin level; Future -     Protime-INR; Future -     APTT; Future  Paresthesia -     Vitamin B12; Future -     Digoxin level; Future -     Protime-INR; Future -     APTT; Future  Elevated LFTs  Chronic systolic CHF (congestive heart failure) (HCC)  Bradycardia  Insomnia  Do not intubate, perform CPR, or defibrillate  CKD (chronic kidney disease), stage III  Other orders -     eszopiclone (LUNESTA) 2 MG TABS tablet; Take 1 tablet (2 mg total) by mouth at bedtime as needed for sleep. Take immediately before bedtime   I am having Mr. Seat start on eszopiclone. I am also having him maintain his Fish Oil, tamsulosin, multivitamin with minerals, MILK THISTLE PO, Senna, metoprolol succinate, LORazepam, levothyroxine, digoxin, warfarin, zolpidem, amiodarone, potassium chloride, furosemide, metoprolol succinate, lisinopril, and KLOR-CON M10.  Meds ordered this encounter  Medications  . eszopiclone (LUNESTA) 2 MG TABS tablet    Sig: Take 1 tablet (2 mg total) by mouth at bedtime as needed for sleep. Take immediately before bedtime    Dispense:  30 tablet    Refill:  1     Follow-up: Return in about 1 week (around 07/14/2015) for a follow-up visit.  Walker Kehr, MD

## 2015-07-07 NOTE — Assessment & Plan Note (Signed)
Lunesta - low dose w/caution. Risk of worsened confusion discussed

## 2015-07-07 NOTE — Patient Instructions (Signed)
Stop all medications except:  Furosemide (Lasix)  Levothyroxine  Trazadone  You have been referred to [facility], they will contact you today

## 2015-07-07 NOTE — Assessment & Plan Note (Signed)
Labs Furosemide Hold Digoxin, Toprol due to bradycardia and low BP

## 2015-07-07 NOTE — Assessment & Plan Note (Signed)
10/16 discussed and confirmed

## 2015-07-07 NOTE — Assessment & Plan Note (Signed)
Labs

## 2015-07-08 ENCOUNTER — Encounter: Payer: Self-pay | Admitting: Internal Medicine

## 2015-07-08 DIAGNOSIS — I5023 Acute on chronic systolic (congestive) heart failure: Secondary | ICD-10-CM | POA: Insufficient documentation

## 2015-07-08 DIAGNOSIS — N179 Acute kidney failure, unspecified: Secondary | ICD-10-CM | POA: Insufficient documentation

## 2015-07-08 DIAGNOSIS — R410 Disorientation, unspecified: Secondary | ICD-10-CM | POA: Insufficient documentation

## 2015-07-08 LAB — DIGOXIN LEVEL: Digoxin Level: 1 ug/L (ref 0.8–2.0)

## 2015-07-08 NOTE — Progress Notes (Signed)
Patient ID: Adetokunbo Mccadden, male   DOB: [birth year], 81 y.o.   MRN: [id number] PCP: Dr.Plotnikov  81 yo with history of CAD, ischemic CMP, CKD, and paroxysmal atrial fibrillation and paroxysmal atrial tachycardia presents for cardiology followup. He speaks little [place] and his daughter interprets.  He feels poorly when he goes into atrial tachycardia and we have not been able to get him off amiodarone despite elevated LFTs.  No chest pain.  He fell in 11/15 and fractured his right hip. This was surgically repaired.    Abdominal US in 7/16 showed a nodular liver concerning for cirrhosis.  Amiodarone was decreased to 200 mg daily.   Over the few weeks, his breathing has been worse and he has become more fatigued.  No chest pain.  I have gradually increased his Lasix.    Today, he is doing very poorly.  Per his daughter, he has been confused for 3 days.  He is not sleeping and is calling out all night.  Profound fatigue, doing almost no walking. Comes in in wheelchair.  SBP currently is 66 though he is awake and alert. Labs done today by Dr Alain Marion showed AKI.   ECG: appears to be sinus rhythm with AV dissociation and wide complex ventricular rhythm.    Labs (9/11): K 4.1, creatinine 1.4  Labs (8/12): K 3.8, creatinine 1.3, TSH normal, LFTs normal, LDL 96, HDL 50 Labs (1/13): K 3.9, creatinine 1.3, TSH normal, LFTs normal Labs (2/13): K 3.8, creatinine 1.3 Labs (5/13): K 3.5, creatinine 1.4, BNP 331, LFTs normal, TSH normal Labs (9/13): LDL 85, HDL 53, BNP 547, LFTs normal Labs (12/13): K 4.3, creatinine 1.49 => 1.4, digoxin 1.4, TSH normal, LFTs normal Labs (1/14): digoxin 1.0, K 4.3, creatinine 1.5 Labs (3/14): free T4 normal Labs (4/14): K 5=> 3.8, creatinine 1.8 => 1.4, digoxin 0.6 Labs (5/14): TSH normal, digoxin level normal Labs (8/14): K 4.2, creatinine 1.66, AST 42, ALT 37 Labs (9/14): K 4, creatinine 1.6, digoxin 1.3, LDL 104, HDL 51, AST 41, ALT 30 Labs (11/14): K 3.6, creatinine  1.5, BNP 126 Labs (12/14): TSH 6.93 Labs (2/15): K 3.7, creatinine 1.4, TSH normal, AST 59, ALT 56 Labs (3/15): TSH low, AST 57, ALT 30, digoxin 1.2 Labs (4/15): K 4, creatinine 1.4, LFTs normal Labs (6/15): TSH normal, digoxin level 0. Labs (8/15): K 4.3, creatinine 1.7 => 1.6 => 1.63, AST 75, ALT 70, hgb 11.8, WBCs 9.7 Labs (10/15): K 4.2, creatinine 1.75, AST 93, ALT 60 Labs (12/15): hgb 10.3, K 4.1, creatinine 1.5, ALT 31, AST 77 Labs (1/16): K 4.2, creatinine 1.51, AST 69, ALT 45, hgb 11, digoxin 0.5 Labs (3/16): K 4.1, creatinine 1.26, TSH normal, HCT 36.7 Labs (6/16): K 4.3, creatinine 1.35, AST 108, ALT 61 Labs (7/16): TSH 10.7, digoxin 0.2 Labs (8/16): AST 111, ALT 54 Labs (9/16): K 4.3, creatinine 1.73, AST 135, ALT 68, BNP 287 Labs (10/16): K 5.3, creatinine 2.87, NH3 40, AST 102, ALT 60, tbili 1.9, HCT 39.8, TSH normal, INR 3  Allergies (verified):  1) ! Crestor  2) Vytorin  3) Atenolol   Past Medical History:  1. Coronary artery disease: LHC 4/06 with 60% pLAD, 40% ramus, 60% mRCA, and 90% mPDA. No intervention. Inferior akinesis on LV-gram was suggestive of possible inferior MI with recanalization.  2. Hyperlipidemia: Myalgias with Lipitor and Crestor.  3. Hypothyroidism  4. Peripheral vascular disease  5. Anxiety  6. Skin cancer, hx of 2008 BCC  7. Colonic polyps 8. Atrial arrhythmias (  PAF and atrial tachycardia): Paroxysmal atrial fibrillation with rapid ventricular response treated with amiodarone therapy converting to normal sinus rhythm 02/2009.  He has also had atypical atrial flutter versus atrial tachycardia noted in 4/14, cardioverted to NSR in 4/14. In atypical flutter versus atrial tachycardia again in 1/15, DCCV in 1/15 to NSR. Atrial tachycardia in 7/15, 8/15, 9/15 with junctional escape in 60s or 2:1 block with faster rate. Off digoxin given PAT with block.  PFTs (10/15) mild restriction, no marked abnormalities.  9. Systolic CHF: Probable ischemic  cardiomyopathy. Echo (6/10) with EF 40%, mildly dilated LV, moderate MR.  Echo (3/12): EF 30-35%, inferolateral severe hypokinesis, trivial AI, moderate to severe MR.  Echo (9/16) with EF 30%, inferolateral akinesis, anterolateral severe hypokinesis, inferior hypokinesis, moderate diastolic dysfunction, severe MR (likely infarct-related).  10. History of PNA  11. CKD. Baseline creatinine around 1.4.  12. Mitral regurgitation: Moderate by echo 6/10. 2-3+ by cath 2006.  Moderate-severe by echo in 3/12. Severe by echo in 9/16.  Likely infarct-related MR.  13. PVCs  14. BPH 15. Recurrent UTIs 16. Imbalance 17. Carotid stenosis: Dopplers (2/15) with 40-59% BICA stenosis. Carotid dopplers (1/16) with 40-59% BICA stenosis.  18. Possible tendinopathy with Cipro 19. Right hip fracture with repair in 11/15.  20. Liver cirrhosis: abdominal US (7/16) with gallstones in gallbladder, nodular liver consistent with cirrhosis.  59. Cholelithiasis  Family History:  Family History of CAD Male 1st degree relative <60   Social History:  Retired  Originally from [place], speaks little [place]  Married  Former Smoker   ROS: All systems reviewed and negative except as per HPI.   Current Outpatient Prescriptions  Medication Sig Dispense Refill  . furosemide (LASIX) 20 MG tablet Take 60 mg in AM and 40 mg in PM    . levothyroxine (SYNTHROID, LEVOTHROID) 150 MCG tablet Take 1 tablet (150 mcg total) by mouth daily before breakfast. 30 tablet 6  . traZODone (DESYREL) 50 MG tablet Take 1 tablet (50 mg total) by mouth at bedtime as needed for sleep. 30 tablet 3   No current facility-administered medications for this encounter.    BP 66/1 mmHg  Pulse 60  SpO2 99% General: NAD, frail Neck: JVP 10 cm; no thyromegaly or thyroid nodule.  Lungs: Slight crackles at bases.   CV: Nondisplaced PMI.  Heart regular, S1/S2, no S3/S4, 2/6 HSM at apex.  1+ edema to knees bilaterally.  Right carotid bruit. Abdomen: Soft,  nontender, no hepatosplenomegaly, mild distention.  Neurologic: Alert but disoriented.   Psych: Normal affect. Extremities: No clubbing or cyanosis.   Assessment/Plan: Mr Conly is doing very poorly today.  He is delirious with hypotension and AKI.  He has liver cirrhosis, progressive renal dysfunction, and progressive CHF.  He is volume overloaded on exam today despite increasing doses of Lasix at home.  BP is quite low, and if we were to proceed aggressively here, he would need to be admitted to the ICU and likely begun on a pressor. I had a discussion with the family (daughter, son-in-law, and wife) today.  I do not think that a hospital admission is likely to change the overall trajectory of his course.  We have elevated to make him DNR/DNI and pursue comfort care.  I am going to involve hospice today.  - Hospice referral, will ask that he be seen ASAP.  - Stop all meds except Lasix and Synthroid.  - He will go home with family.   Loralie Champagne 07/08/2015

## 2015-07-11 ENCOUNTER — Other Ambulatory Visit: Payer: Self-pay | Admitting: Internal Medicine

## 2015-07-11 MED ORDER — LACTULOSE 10 GM/15ML PO SOLN: 10.0000 g | Freq: Three times a day (TID) | ORAL | Status: AC

## 2015-07-14 ENCOUNTER — Encounter: Payer: Self-pay | Admitting: Internal Medicine

## 2015-07-14 ENCOUNTER — Encounter: Payer: Self-pay | Admitting: Cardiology

## 2015-07-15 ENCOUNTER — Telehealth: Payer: Self-pay | Admitting: Cardiology

## 2015-07-15 NOTE — Telephone Encounter (Signed)
Original d/c received from [facility]. Dr.McLean back In office Monday will get him to sign then.

## 2015-07-18 ENCOUNTER — Telehealth: Payer: Self-pay | Admitting: Cardiology

## 2015-07-18 NOTE — Telephone Encounter (Signed)
Dr.McLean signed d/c called [facility] [phone] for pick up.

## 2015-07-26 DEATH — deceased

## 2015-12-22 ENCOUNTER — Encounter (INDEPENDENT_AMBULATORY_CARE_PROVIDER_SITE_OTHER): Payer: Self-pay
